# Patient Record
Sex: Male | Born: 1945 | Race: White | Hispanic: No | Marital: Single
Health system: Southern US, Community
[De-identification: ages and names within clinical notes are randomized; demographics above are authoritative.]

## PROBLEM LIST (undated history)

## (undated) DIAGNOSIS — K260 Acute duodenal ulcer with hemorrhage: Secondary | ICD-10-CM

## (undated) DIAGNOSIS — E8809 Other disorders of plasma-protein metabolism, not elsewhere classified: Secondary | ICD-10-CM

## (undated) DIAGNOSIS — I10 Essential (primary) hypertension: Secondary | ICD-10-CM

## (undated) DIAGNOSIS — I872 Venous insufficiency (chronic) (peripheral): Secondary | ICD-10-CM

## (undated) DIAGNOSIS — I4819 Other persistent atrial fibrillation: Secondary | ICD-10-CM

## (undated) DIAGNOSIS — I4891 Unspecified atrial fibrillation: Secondary | ICD-10-CM

## (undated) DIAGNOSIS — D696 Thrombocytopenia, unspecified: Secondary | ICD-10-CM

## (undated) DIAGNOSIS — I509 Heart failure, unspecified: Secondary | ICD-10-CM

## (undated) DIAGNOSIS — D62 Acute posthemorrhagic anemia: Secondary | ICD-10-CM

## (undated) DIAGNOSIS — L309 Dermatitis, unspecified: Secondary | ICD-10-CM

## (undated) DIAGNOSIS — I429 Cardiomyopathy, unspecified: Secondary | ICD-10-CM

## (undated) DIAGNOSIS — N289 Disorder of kidney and ureter, unspecified: Secondary | ICD-10-CM

## (undated) DIAGNOSIS — D689 Coagulation defect, unspecified: Secondary | ICD-10-CM

## (undated) DIAGNOSIS — I5022 Chronic systolic (congestive) heart failure: Secondary | ICD-10-CM

## (undated) DIAGNOSIS — I499 Cardiac arrhythmia, unspecified: Secondary | ICD-10-CM

## (undated) HISTORY — DX: Acute posthemorrhagic anemia: D62

## (undated) HISTORY — DX: Cardiomyopathy, unspecified: I42.9

## (undated) HISTORY — DX: Other persistent atrial fibrillation: I48.19

## (undated) HISTORY — DX: Thrombocytopenia, unspecified: D69.6

## (undated) HISTORY — DX: Venous insufficiency (chronic) (peripheral): I87.2

## (undated) HISTORY — DX: Disorder of kidney and ureter, unspecified: N28.9

## (undated) HISTORY — DX: Coagulation defect, unspecified: D68.9

## (undated) HISTORY — DX: Chronic systolic (congestive) heart failure: I50.22

## (undated) HISTORY — DX: Other disorders of plasma-protein metabolism, not elsewhere classified: E88.09

---
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
## 1991-09-28 HISTORY — PX: HERNIA REPAIR: SHX51

## 2014-06-02 ENCOUNTER — Inpatient Hospital Stay (HOSPITAL_COMMUNITY)
Admission: EM | Admit: 2014-06-02 | Discharge: 2014-06-14 | DRG: 308 | Disposition: A | Discharge status: Home / Self Care | Payer: BC Managed Care – PPO | Attending: Internal Medicine | Admitting: Internal Medicine

## 2014-06-02 ENCOUNTER — Emergency Department (HOSPITAL_COMMUNITY): Payer: BC Managed Care – PPO

## 2014-06-02 ENCOUNTER — Encounter (HOSPITAL_COMMUNITY): Payer: Self-pay | Admitting: Emergency Medicine

## 2014-06-02 DIAGNOSIS — Z8249 Family history of ischemic heart disease and other diseases of the circulatory system: Secondary | ICD-10-CM

## 2014-06-02 DIAGNOSIS — I5031 Acute diastolic (congestive) heart failure: Secondary | ICD-10-CM

## 2014-06-02 DIAGNOSIS — I10 Essential (primary) hypertension: Secondary | ICD-10-CM | POA: Diagnosis present

## 2014-06-02 DIAGNOSIS — I447 Left bundle-branch block, unspecified: Secondary | ICD-10-CM | POA: Diagnosis not present

## 2014-06-02 DIAGNOSIS — I4891 Unspecified atrial fibrillation: Secondary | ICD-10-CM | POA: Diagnosis not present

## 2014-06-02 DIAGNOSIS — K5900 Constipation, unspecified: Secondary | ICD-10-CM | POA: Diagnosis not present

## 2014-06-02 DIAGNOSIS — I059 Rheumatic mitral valve disease, unspecified: Secondary | ICD-10-CM | POA: Diagnosis present

## 2014-06-02 DIAGNOSIS — I340 Nonrheumatic mitral (valve) insufficiency: Secondary | ICD-10-CM

## 2014-06-02 DIAGNOSIS — I509 Heart failure, unspecified: Secondary | ICD-10-CM | POA: Diagnosis present

## 2014-06-02 DIAGNOSIS — K250 Acute gastric ulcer with hemorrhage: Secondary | ICD-10-CM

## 2014-06-02 DIAGNOSIS — E876 Hypokalemia: Secondary | ICD-10-CM | POA: Diagnosis not present

## 2014-06-02 DIAGNOSIS — L259 Unspecified contact dermatitis, unspecified cause: Secondary | ICD-10-CM | POA: Diagnosis present

## 2014-06-02 DIAGNOSIS — K920 Hematemesis: Secondary | ICD-10-CM | POA: Diagnosis present

## 2014-06-02 DIAGNOSIS — Z79899 Other long term (current) drug therapy: Secondary | ICD-10-CM

## 2014-06-02 DIAGNOSIS — K260 Acute duodenal ulcer with hemorrhage: Secondary | ICD-10-CM | POA: Diagnosis present

## 2014-06-02 DIAGNOSIS — I428 Other cardiomyopathies: Secondary | ICD-10-CM | POA: Diagnosis present

## 2014-06-02 DIAGNOSIS — I4819 Other persistent atrial fibrillation: Secondary | ICD-10-CM | POA: Diagnosis present

## 2014-06-02 DIAGNOSIS — N179 Acute kidney failure, unspecified: Secondary | ICD-10-CM | POA: Diagnosis present

## 2014-06-02 DIAGNOSIS — I110 Hypertensive heart disease with heart failure: Secondary | ICD-10-CM

## 2014-06-02 DIAGNOSIS — D689 Coagulation defect, unspecified: Secondary | ICD-10-CM | POA: Diagnosis present

## 2014-06-02 DIAGNOSIS — D62 Acute posthemorrhagic anemia: Secondary | ICD-10-CM | POA: Diagnosis present

## 2014-06-02 DIAGNOSIS — I482 Chronic atrial fibrillation, unspecified: Secondary | ICD-10-CM

## 2014-06-02 DIAGNOSIS — K922 Gastrointestinal hemorrhage, unspecified: Secondary | ICD-10-CM

## 2014-06-02 DIAGNOSIS — I498 Other specified cardiac arrhythmias: Secondary | ICD-10-CM | POA: Diagnosis not present

## 2014-06-02 DIAGNOSIS — I9589 Other hypotension: Secondary | ICD-10-CM | POA: Diagnosis not present

## 2014-06-02 DIAGNOSIS — K449 Diaphragmatic hernia without obstruction or gangrene: Secondary | ICD-10-CM | POA: Diagnosis present

## 2014-06-02 DIAGNOSIS — E119 Type 2 diabetes mellitus without complications: Secondary | ICD-10-CM | POA: Diagnosis present

## 2014-06-02 DIAGNOSIS — I5021 Acute systolic (congestive) heart failure: Secondary | ICD-10-CM | POA: Diagnosis present

## 2014-06-02 HISTORY — DX: Essential (primary) hypertension: I10

## 2014-06-02 HISTORY — DX: Acute duodenal ulcer with hemorrhage: K26.0

## 2014-06-02 HISTORY — DX: Dermatitis, unspecified: L30.9

## 2014-06-02 HISTORY — DX: Cardiac arrhythmia, unspecified: I49.9

## 2014-06-02 HISTORY — DX: Unspecified atrial fibrillation: I48.91

## 2014-06-02 LAB — CBC WITH DIFFERENTIAL/PLATELET
Basophils Absolute: 0 10*3/uL (ref 0.0–0.1)
Basophils Relative: 0 % (ref 0–1)
Eosinophils Absolute: 0.1 10*3/uL (ref 0.0–0.7)
Eosinophils Relative: 1 % (ref 0–5)
HCT: 47 % (ref 39.0–52.0)
HEMOGLOBIN: 16.1 g/dL (ref 13.0–17.0)
LYMPHS PCT: 8 % — AB (ref 12–46)
Lymphs Abs: 0.7 10*3/uL (ref 0.7–4.0)
MCH: 32.5 pg (ref 26.0–34.0)
MCHC: 34.3 g/dL (ref 30.0–36.0)
MCV: 94.9 fL (ref 78.0–100.0)
Monocytes Absolute: 0.6 10*3/uL (ref 0.1–1.0)
Monocytes Relative: 8 % (ref 3–12)
NEUTROS ABS: 6.5 10*3/uL (ref 1.7–7.7)
Neutrophils Relative %: 83 % — ABNORMAL HIGH (ref 43–77)
Platelets: 225 10*3/uL (ref 150–400)
RBC: 4.95 MIL/uL (ref 4.22–5.81)
RDW: 14.7 % (ref 11.5–15.5)
WBC: 7.9 10*3/uL (ref 4.0–10.5)

## 2014-06-02 LAB — I-STAT CHEM 8, ED
BUN: 30 mg/dL — AB (ref 6–23)
CREATININE: 1.4 mg/dL — AB (ref 0.50–1.35)
Calcium, Ion: 1.06 mmol/L — ABNORMAL LOW (ref 1.13–1.30)
Chloride: 107 mEq/L (ref 96–112)
Glucose, Bld: 113 mg/dL — ABNORMAL HIGH (ref 70–99)
HCT: 48 % (ref 39.0–52.0)
HEMOGLOBIN: 16.3 g/dL (ref 13.0–17.0)
POTASSIUM: 3.4 meq/L — AB (ref 3.7–5.3)
SODIUM: 142 meq/L (ref 137–147)
TCO2: 22 mmol/L (ref 0–100)

## 2014-06-02 LAB — PROTIME-INR
INR: 1.32 (ref 0.00–1.49)
Prothrombin Time: 16.4 seconds — ABNORMAL HIGH (ref 11.6–15.2)

## 2014-06-02 LAB — HEMOGLOBIN A1C
Hgb A1c MFr Bld: 6.2 % — ABNORMAL HIGH (ref <5.7)
Mean Plasma Glucose: 131 mg/dL — ABNORMAL HIGH (ref <117)

## 2014-06-02 LAB — COMPREHENSIVE METABOLIC PANEL
ALT: 113 U/L — ABNORMAL HIGH (ref 0–53)
ANION GAP: 16 — AB (ref 5–15)
AST: 41 U/L — ABNORMAL HIGH (ref 0–37)
Albumin: 3.1 g/dL — ABNORMAL LOW (ref 3.5–5.2)
Alkaline Phosphatase: 48 U/L (ref 39–117)
BUN: 29 mg/dL — AB (ref 6–23)
CALCIUM: 8.3 mg/dL — AB (ref 8.4–10.5)
CO2: 22 mEq/L (ref 19–32)
Chloride: 107 mEq/L (ref 96–112)
Creatinine, Ser: 1.33 mg/dL (ref 0.50–1.35)
GFR, EST AFRICAN AMERICAN: 63 mL/min — AB (ref >90)
GFR, EST NON AFRICAN AMERICAN: 54 mL/min — AB (ref >90)
GLUCOSE: 95 mg/dL (ref 70–99)
Potassium: 4.2 mEq/L (ref 3.7–5.3)
SODIUM: 145 meq/L (ref 137–147)
Total Bilirubin: 2.3 mg/dL — ABNORMAL HIGH (ref 0.3–1.2)
Total Protein: 5.5 g/dL — ABNORMAL LOW (ref 6.0–8.3)

## 2014-06-02 LAB — PRO B NATRIURETIC PEPTIDE: PRO B NATRI PEPTIDE: 4097 pg/mL — AB (ref 0–125)

## 2014-06-02 LAB — TSH: TSH: 1.9 u[IU]/mL (ref 0.350–4.500)

## 2014-06-02 LAB — TROPONIN I: Troponin I: <0.3 ng/mL (ref <0.30)

## 2014-06-02 LAB — MAGNESIUM: Magnesium: 2.2 mg/dL (ref 1.5–2.5)

## 2014-06-02 LAB — T4, FREE: FREE T4: 1.46 ng/dL (ref 0.80–1.80)

## 2014-06-02 MED ORDER — POTASSIUM CHLORIDE CRYS ER 20 MEQ PO TBCR: 40.0000 meq | EXTENDED_RELEASE_TABLET | Freq: Once | ORAL | Status: DC

## 2014-06-02 MED ORDER — POTASSIUM CHLORIDE CRYS ER 20 MEQ PO TBCR
40.0000 meq | EXTENDED_RELEASE_TABLET | Freq: Once | ORAL | Status: AC
  Administered 2014-06-02: 40 meq via ORAL
  Filled 2014-06-02: qty 2

## 2014-06-02 MED ORDER — DILTIAZEM HCL 60 MG PO TABS
60.0000 mg | ORAL_TABLET | Freq: Four times a day (QID) | ORAL | Status: DC
  Administered 2014-06-02: 60 mg via ORAL
  Filled 2014-06-02 (×3): qty 1

## 2014-06-02 MED ORDER — APIXABAN 5 MG PO TABS
5.0000 mg | ORAL_TABLET | Freq: Two times a day (BID) | ORAL | Status: DC
  Administered 2014-06-02 – 2014-06-05 (×7): 5 mg via ORAL
  Filled 2014-06-02 (×7): qty 1

## 2014-06-02 MED ORDER — SODIUM CHLORIDE 0.9 % IJ SOLN: 3.0000 mL | INTRAMUSCULAR | Status: DC | PRN

## 2014-06-02 MED ORDER — DILTIAZEM HCL 90 MG PO TABS: 90.0000 mg | ORAL_TABLET | Freq: Four times a day (QID) | ORAL | Status: DC

## 2014-06-02 MED ORDER — SODIUM CHLORIDE 0.9 % IJ SOLN
3.0000 mL | Freq: Two times a day (BID) | INTRAMUSCULAR | Status: DC
  Administered 2014-06-02 – 2014-06-06 (×7): 3 mL via INTRAVENOUS

## 2014-06-02 MED ORDER — ONDANSETRON HCL 4 MG/2ML IJ SOLN
4.0000 mg | Freq: Four times a day (QID) | INTRAMUSCULAR | Status: DC | PRN
Start: 2014-06-02 — End: 2014-06-14
  Administered 2014-06-07 – 2014-06-08 (×3): 4 mg via INTRAVENOUS
  Filled 2014-06-02 (×4): qty 2

## 2014-06-02 MED ORDER — ALPRAZOLAM 0.25 MG PO TABS: 0.2500 mg | ORAL_TABLET | Freq: Two times a day (BID) | ORAL | Status: DC | PRN

## 2014-06-02 MED ORDER — DILTIAZEM HCL 100 MG IV SOLR
5.0000 mg/h | INTRAVENOUS | Status: DC
  Filled 2014-06-02: qty 100

## 2014-06-02 MED ORDER — ELETONE EX CREA: 1.0000 "application " | TOPICAL_CREAM | Freq: Two times a day (BID) | CUTANEOUS | Status: DC | PRN

## 2014-06-02 MED ORDER — OFF THE BEAT BOOK
Freq: Once | Status: AC
  Administered 2014-06-02: 1
  Filled 2014-06-02: qty 1

## 2014-06-02 MED ORDER — DILTIAZEM HCL 90 MG PO TABS
90.0000 mg | ORAL_TABLET | Freq: Four times a day (QID) | ORAL | Status: DC
  Administered 2014-06-02 – 2014-06-03 (×2): 90 mg via ORAL
  Filled 2014-06-02 (×6): qty 1

## 2014-06-02 MED ORDER — DILTIAZEM HCL 25 MG/5ML IV SOLN
20.0000 mg | Freq: Once | INTRAVENOUS | Status: AC
  Administered 2014-06-02: 20 mg via INTRAVENOUS
  Filled 2014-06-02: qty 5

## 2014-06-02 MED ORDER — DEXTROSE 5 % IV SOLN
5.0000 mg/h | INTRAVENOUS | Status: DC
  Administered 2014-06-02: 5 mg/h via INTRAVENOUS
  Filled 2014-06-02: qty 100

## 2014-06-02 MED ORDER — ACETAMINOPHEN 325 MG PO TABS: 650.0000 mg | ORAL_TABLET | ORAL | Status: DC | PRN

## 2014-06-02 MED ORDER — SODIUM CHLORIDE 0.9 % IV SOLN: 250.0000 mL | INTRAVENOUS | Status: DC | PRN

## 2014-06-02 NOTE — ED Notes (Signed)
Received pt from home via EMS with c/o diagnosed with Afib on Monday. Pt was not feeling well 05/24/2014. Pt was prescribed metoprolol and takes lisinopril for HTN. Pt reports lethargy and decrease in appetite increased. Pt stopped taking the metoprolol 48 hours ago. Pt felt his heart racing and shortness of breath. Pt given 324 mg of ASA by EMS. EMS EKG showed afib and LBBB. Pt denies history of LBBB.

## 2014-06-02 NOTE — ED Provider Notes (Signed)
CSN: [id number]     Arrival date & time 06/02/14  1478 History   First MD Initiated Contact with Patient 06/02/14 1009     Chief Complaint  Patient presents with  . Atrial Fibrillation  . Fatigue  . Shortness of Breath     (Consider location/radiation/quality/duration/timing/severity/associated sxs/prior Treatment) HPI Complains of fatigue, weakness in his legs, and shortness of breath onset 7 days ago. He was seen by Dr. Su Hilt 6 days ago determined to be in atrial fibrillation, started on metoprolol 6 days ago which she took until 05/31/2014. He stopped the medication as it was causing increasing fatigue. He presents today complaining of shortness of breath and fatigue brought by EMS EMS determined patient to be in atrial fibrillation with rapid ventricular response. Treated patient with aspirin. Nothing makes symptoms better or worse. Past Medical History  Diagnosis Date  . Hypertension   . A-fib   . Eczema    patient learned of atrial fibrillation one week ago History reviewed. No pertinent past surgical history. No family history on file. History  Substance Use Topics  . Smoking status: Never Smoker   . Smokeless tobacco: Not on file  . Alcohol Use: No    Review of Systems  Constitutional: Positive for fatigue.  HENT: Negative.   Respiratory: Positive for shortness of breath.   Cardiovascular: Negative.   Gastrointestinal: Negative.   Musculoskeletal: Negative.   Skin: Negative.   Neurological: Negative.   Psychiatric/Behavioral: Negative.   All other systems reviewed and are negative.     Allergies  Review of patient's allergies indicates no known allergies.  Home Medications   Prior to Admission medications   Not on File   BP 127/81  Pulse 127  Temp(Src) 98.5 F (36.9 C) (Oral)  Resp 22  Ht  (1.905 m)  Wt 265 lb (120.203 kg)  BMI 33.12 kg/m2  SpO2 96% Physical Exam  Nursing note and vitals reviewed. Constitutional: He appears well-developed  and well-nourished.  HENT:  Head: Normocephalic and atraumatic.  Eyes: Conjunctivae are normal. Pupils are equal, round, and reactive to light.  Neck: Neck supple. No tracheal deviation present. No thyromegaly present.  Cardiovascular:  No murmur heard. Irregularly irregular tachycardic  Pulmonary/Chest: Effort normal and breath sounds normal.  Abdominal: Soft. Bowel sounds are normal. He exhibits no distension. There is no tenderness.  Musculoskeletal: Normal range of motion. He exhibits no edema and no tenderness.  Neurological: He is alert. Coordination normal.  Skin: Skin is warm and dry. No rash noted.  Psychiatric: He has a normal mood and affect.    ED Course  Procedures (including critical care time) Labs Review Labs Reviewed  TROPONIN I  PRO B NATRIURETIC PEPTIDE  CBC WITH DIFFERENTIAL  I-STAT CHEM 8, ED   vagal maneuvers attempted, without change in heart rate Imaging Review No results found.   EKG Interpretation   Date/Time:  :25 a.m. patient states "I feel much improved." After treatment with intravenous [id number]  Date: 06/02/2014  Rate: 105  Rhythm: atrial fibrillation  QRS Axis: normal  Intervals: normal  ST/T Wave abnormalities: nonspecific ST/T changes  Conduction Disutrbances:left bundle  branch block  Narrative Interpretation:   Old EKG Reviewed: Rate slower than previous tracing Results for orders placed during the hospital encounter of 06/02/14  TROPONIN I      Result Value Ref Range   Troponin I <0.30  <0.30 ng/mL  PRO B NATRIURETIC PEPTIDE      Result Value Ref Range   Pro B Natriuretic peptide (BNP)  4097.0 (*) 0 - 125 pg/mL  CBC WITH DIFFERENTIAL      Result Value Ref Range   WBC 7.9  4.0 - 10.5 K/uL   RBC 4.95  4.22 - 5.81 MIL/uL   Hemoglobin 16.1  13.0 - 17.0 g/dL   HCT 59.9  35.7 - 01.7 %   MCV 94.9  78.0 - 100.0 fL   MCH 32.5  26.0 - 34.0 pg   MCHC 34.3  30.0 - 36.0 g/dL   RDW 79.3  90.3 - 00.9 %   Platelets 225  150 - 400 K/uL   Neutrophils Relative % 83 (*) 43 - 77 %   Neutro Abs 6.5  1.7 - 7.7 K/uL   Lymphocytes Relative 8 (*) 12 - 46 %   Lymphs Abs 0.7  0.7 - 4.0 K/uL   Monocytes Relative 8  3 - 12 %   Monocytes Absolute 0.6  0.1 - 1.0 K/uL   Eosinophils Relative 1  0 - 5 %   Eosinophils Absolute 0.1  0.0 - 0.7 K/uL   Basophils Relative 0  0 - 1 %   Basophils Absolute 0.0  0.0 - 0.1 K/uL  I-STAT CHEM 8, ED      Result Value Ref Range   Sodium 142  137 - 147 mEq/L   Potassium 3.4 (*) 3.7 - 5.3 mEq/L   Chloride 107  96 - 112 mEq/L   BUN 30 (*) 6 - 23 mg/dL   Creatinine, Ser 2.33 (*) 0.50 - 1.35 mg/dL   Glucose, Bld 007 (*) 70 - 99 mg/dL   Calcium, Ion 6.22 (*) 1.13 - 1.30 mmol/L   TCO2 22  0 - 100 mmol/L   Hemoglobin 16.3  13.0 - 17.0 g/dL   HCT 63.3  35.4 - 56.2 %   Dg Chest Portable 1 View  06/02/2014   CLINICAL DATA:  Shortness of breath, fatigue, history of atrial fibrillation.  EXAM: PORTABLE CHEST - 1 VIEW  COMPARISON:  None.  FINDINGS: Markedly enlarged cardiac silhouette and mediastinal contours. Pulmonary venous congestion without frank evidence of edema. Bibasilar heterogeneous opacities, left greater than right. No definite pleural effusion, though note, the bilateral costophrenic angles are excluded from view. No pneumothorax. Unchanged bones.  IMPRESSION: Marked cardiomegaly and pulmonary venous congestion without frank evidence of edema. Further evaluation with a PA and lateral chest radiograph may be obtained as clinically indicated.   Electronically Signed   By: Simonne Come M.D.   On: 06/02/2014 10:54   Chest xray viewed by me MDM  Dr. Johney Frame was  consulted and made arrangements for inpatient stay Final diagnoses:  None   In light of elevated bnp , mild chf likely Dx#1 atrial fibrillation  With rapid ventricular response #2 hypokalemia #3 CHF      [name], MD 06/02/14 1353

## 2014-06-02 NOTE — Progress Notes (Signed)
Called ED to attempt to get report on pt.

## 2014-06-02 NOTE — Progress Notes (Signed)
Telemetry showing sustained tachycardia/A-fib (140s-150s).  Pt is asymptomatic and he is only lying in the bed at this time.  BP=154/108.  Notified Dr Alphonsus Sias and new orders received.  Will cont to monitor.

## 2014-06-02 NOTE — Progress Notes (Signed)
Cardizem gtt started @ 2053 at 5mg /hr.  Patient's rhythm: A-fib with BBB with a rate of 109-115 bpm.  Will continue to monitor.

## 2014-06-02 NOTE — Progress Notes (Signed)
Pt has been asymptomatic with afib. He is hemodynamically quite stable.   I would like to avoid IV cardizem in order to avoid a prolongation of his hospital stay. Will increase oral diltiazem to 90mg  q6 hours and try to wean off IV cardizem as long as HRs are less than 100 or 110 bpm.  Hillis Range MD

## 2014-06-02 NOTE — H&P (Signed)
History and Physical  Patient ID: Bryan Moran MRN: [id number], DOB: Feb 14, 1946 Date of Encounter: 06/02/2014, 12:40 PM Primary Physician: Lorenda Peck, MD Primary Cardiologist: New to Dr. Johney Frame  Chief Complaint: SOB, "just feel bad" Reason for Admission: recently diagnosed persistent atrial fibrillation  HPI: Bryan Moran is a 67 y/o active M runner with history of HTN and eczema who was recently diagnosed with atrial fibrillation. He runs 4 miles twice a week typically without any difficulty. About 4 weeks ago while running he started perspiring profusely which was unusual. He rested and this resolved. He was able to go on with running as usual for several weeks after. However, during the last full week of August he began feeling "bad" with malaise, decreased appetite, fatigue, SOB and intermittent dizziness. He saw his PCP on Monday 8/31 who diagnosed him with atrial fib and prescribed metoprolol. However, he felt "horrible" while taking this so discontinued it on 9/4. He has had some orthopnea.Today he felt shaky, short of breath and fatigued so was brought into the ED by EMS with AF-RVR, HR 130s-140s. He is also noted to have a LBBB of unclear chronicity. He says he had an EKG back in June for his annual physical and was told everything looked all right. It is Saturday thus that EKG is unavailable. He has not had any chest pain to speak of, even while running. No prior h/o DM, TIA/CVA, bleeding issues, falls, LEE, weight changes, syncope, recent travel/bedrest/surgery or family/personal hx of blood clots. In the ED, workup significant for hypokalemia 3.4, BUN/Cr 30/1.4, glu 113, troponin neg x 1, Hgb 16.1, pBNP 4k. CXR with marked cardiomegaly and pulmonary venous congestion without frank evidence of edema. He was given 20mg  IV diltiazem with HR improvement to 110's. He has been unaware of palpitations.  Past Medical History  Diagnosis Date  . Hypertension   . A-fib     a. Officially dx  05/27/14, sx may have been going on longer.  . Eczema      Most Recent Cardiac Studies: None   Surgical History: History reviewed. No pertinent past surgical history.   Home Meds: Prior to Admission medications   Medication Sig Start Date End Date Taking? Authorizing Provider  Dermatological Products, Misc. (ELETONE EX) Apply 1 application topically 2 (two) times daily as needed (eczema).   Yes Historical Provider, MD  lisinopril-hydrochlorothiazide (PRINZIDE,ZESTORETIC) 20-12.5 MG per tablet Take 1 tablet by mouth daily.   Yes Historical Provider, MD  Naproxen Sodium (ALEVE PO) Take 2 tablets by mouth daily as needed (pain).   Yes Historical Provider, MD    Allergies: No Known Allergies  History   Social History  . Marital Status: Single    Spouse Name: N/A    Number of Children: N/A  . Years of Education: N/A   Occupational History  . Not on file.   Social History Main Topics  . Smoking status: Never Smoker   . Smokeless tobacco: Not on file  . Alcohol Use: No  . Drug Use: No  . Sexual Activity: Not on file   Other Topics Concern  . Not on file   Social History Narrative  . No narrative on file     Family History  Problem Relation Age of Onset  . Depression Father   . Heart disease Father     Some sort of heart issue at end of life - possibly CAD  . Multiple sclerosis Mother   . Cancer Mother  Tumor in kidney, spread to bones    Review of Systems: General: negative for chills, fever, night sweats  Cardiovascular: see above Dermatological: negative for rash Respiratory: negative for cough or wheezing Urologic: negative for hematuria Abdominal: negative for nausea, vomiting, diarrhea, bright [place] blood per rectum, melena, or hematemesis Neurologic: negative for visual changes, syncope, or dizziness All other systems reviewed and are otherwise negative except as noted above.  Labs:   Lab Results  Component Value Date   WBC 7.9 06/02/2014   HGB 16.3  06/02/2014   HCT 48.0 06/02/2014   MCV 94.9 06/02/2014   PLT 225 06/02/2014    Recent Labs Lab 06/02/14 1050  NA 142  K 3.4*  CL 107  BUN 30*  CREATININE 1.40*  GLUCOSE 113*    Recent Labs  06/02/14 1038  TROPONINI <0.30    Radiology/Studies:  Dg Chest Portable 1 View 06/02/2014   CLINICAL DATA:  Shortness of breath, fatigue, history of atrial fibrillation.  EXAM: PORTABLE CHEST - 1 VIEW  COMPARISON:  None.  FINDINGS: Markedly enlarged cardiac silhouette and mediastinal contours. Pulmonary venous congestion without frank evidence of edema. Bibasilar heterogeneous opacities, left greater than right. No definite pleural effusion, though note, the bilateral costophrenic angles are excluded from view. No pneumothorax. Unchanged bones.  IMPRESSION: Marked cardiomegaly and pulmonary venous congestion without frank evidence of edema. Further evaluation with a PA and lateral chest radiograph may be obtained as clinically indicated.   Electronically Signed   By: Simonne Come M.D.   On: 06/02/2014 10:54    EKG: atrial fib with LBBB, nonspecific ST-T changes  Physical Exam: Blood pressure 118/88, pulse 99, temperature 98.7 F (37.1 C), temperature source Oral, resp. rate 17, height  (1.905 m), weight 265 lb (120.203 kg), SpO2 95.00%. General: Well developed, well nourished, in no acute distress. Head: Normocephalic, atraumatic, sclera non-icteric, no xanthomas, nares are without discharge.  Neck: Negative for carotid bruits. JVD not elevated. Lungs: Slightly coarse at bases but generally clear bilaterally to auscultation without wheezes, rales, or rhonchi. Breathing is unlabored. Heart: Irr irreg, tachycardic, with S1 S2. No murmurs, rubs, or gallops appreciated. Abdomen: Soft, non-tender, rounded with normoactive bowel sounds. No hepatomegaly. No rebound/guarding. No obvious abdominal masses. Msk:  Strength and tone appear normal for age. Extremities: No clubbing or cyanosis. No edema.  Distal  pedal pulses are 2+ and equal bilaterally. Neuro: Alert and oriented X 3. No focal deficit. No facial asymmetry. Moves all extremities spontaneously. Psych:  Responds to questions appropriately with a normal affect.    ASSESSMENT AND PLAN:  1. Recently diagnosed persistent atrial fibrillation with RVR - will start oral diltiazem. He did not tolerate metoprolol well - not sure if maybe BP was low when this was added to his usual lisinopril/HCTZ, or if it just really didn't help with rate. Check TSH and echo. CHADSVASC = at least 2 for HTN and age >70; pending echo/A1C thus he likely warrants anticoagulation. Start NOAC. He already feels somewhat better with HR improved to the 110 range - if symptoms continue to improve, we could consider anticoagulation for 3-4 weeks then outpatient DCCV. However, if he remains feeling poorly or LV down would consider TEE/DCCV. 2. Possible acute CHF r/t #1 - marked cardiomegaly on CXR with vascular congestion. pBNP is up. He's had orthopnea. He does not really have signs of a pericardial effusion but can assess on echo. Will d/w MD and follow symptoms after rate control. 3. Acute kidney injury / unknown  baseline renal function - hold lisinopril/HCTZ in lieu of rate controlling agents and follow. 4. HTN - follow. 5. LBBB - unknown chronicity. No chest pain. May be helpful to compare to June EKG from PCP when office re-opens. 6. Hypokalemia - replete with KCl in ER and follow. May be due to HCTZ. 7. Hyperglycemia - check A1C. 8. Hypocalcemia - check full CMET as opposed to Istat.   Signed, Dayna Dunn PA-C 06/02/2014, 12:40 PM    I have seen, examined the patient, and reviewed the above assessment and plan.  Changes to above are made where necessary.  Will admit to adjust rate control.  Echo to evaluate for structural heart disease.  Will likely need to pursue sinus rhythm once adequately anticoagulated.  Will start eliquis today.  If symptoms are improved with  rate control then perhaps we can discharge to return in 3 weeks for cardioversion.  If he is not better with rate control then we may need to consider TEE cardioversion while here.  Co Sign: Hillis Range, MD 06/02/2014 2:30 PM

## 2014-06-03 DIAGNOSIS — I059 Rheumatic mitral valve disease, unspecified: Secondary | ICD-10-CM

## 2014-06-03 LAB — BASIC METABOLIC PANEL
Anion gap: 13 (ref 5–15)
BUN: 28 mg/dL — AB (ref 6–23)
CALCIUM: 8 mg/dL — AB (ref 8.4–10.5)
CO2: 23 meq/L (ref 19–32)
Chloride: 106 mEq/L (ref 96–112)
Creatinine, Ser: 1.26 mg/dL (ref 0.50–1.35)
GFR calc Af Amer: 67 mL/min — ABNORMAL LOW (ref >90)
GFR, EST NON AFRICAN AMERICAN: 58 mL/min — AB (ref >90)
GLUCOSE: 116 mg/dL — AB (ref 70–99)
Potassium: 3.6 mEq/L — ABNORMAL LOW (ref 3.7–5.3)
Sodium: 142 mEq/L (ref 137–147)

## 2014-06-03 LAB — LIPID PANEL
Cholesterol: 119 mg/dL (ref 0–200)
HDL: 24 mg/dL — ABNORMAL LOW (ref >39)
LDL CALC: 80 mg/dL (ref 0–99)
TRIGLYCERIDES: 73 mg/dL (ref <150)
Total CHOL/HDL Ratio: 5 RATIO
VLDL: 15 mg/dL (ref 0–40)

## 2014-06-03 LAB — CBC
HCT: 47.3 % (ref 39.0–52.0)
HEMOGLOBIN: 15.6 g/dL (ref 13.0–17.0)
MCH: 31.8 pg (ref 26.0–34.0)
MCHC: 33 g/dL (ref 30.0–36.0)
MCV: 96.3 fL (ref 78.0–100.0)
Platelets: 216 10*3/uL (ref 150–400)
RBC: 4.91 MIL/uL (ref 4.22–5.81)
RDW: 14.7 % (ref 11.5–15.5)
WBC: 9 10*3/uL (ref 4.0–10.5)

## 2014-06-03 MED ORDER — DILTIAZEM HCL ER COATED BEADS 240 MG PO CP24
240.0000 mg | ORAL_CAPSULE | Freq: Two times a day (BID) | ORAL | Status: DC
  Administered 2014-06-03 – 2014-06-04 (×3): 240 mg via ORAL
  Filled 2014-06-03 (×4): qty 1

## 2014-06-03 MED ORDER — FUROSEMIDE 10 MG/ML IJ SOLN
40.0000 mg | Freq: Once | INTRAMUSCULAR | Status: AC
  Administered 2014-06-03: 40 mg via INTRAVENOUS
  Filled 2014-06-03: qty 4

## 2014-06-03 NOTE — Care Management Note (Unsigned)
    Page 1 of 2   06/07/2014     3:07:29 PM CARE MANAGEMENT NOTE 06/07/2014  Patient:  Camero,Khadir   Account Number:  [id number]  Date Initiated:  06/02/2014  Documentation initiated by:  [facility]  Subjective/Objective Assessment:   Admitted with Afib.     Action/Plan:   Anticipated DC Date:  06/05/2014   Anticipated DC Plan:  HOME/SELF CARE      DC Planning Services  CM consult  Medication Assistance      Choice offered to / List presented to:             Status of service:  In process, will continue to follow Medicare Important Message given?   (If response is "NO", the following Medicare IM given date fields will be blank) Date Medicare IM given:   Medicare IM given by:   Date Additional Medicare IM given:   Additional Medicare IM given by:    Discharge Disposition:    Per UR Regulation:  Reviewed for med. necessity/level of care/duration of stay  If discussed at Long Length of Stay Meetings, dates discussed:    Comments:   06-07-14 1504 Tomi Bamberger, RN, BSN [phone] Eliquis co pay per bcbs [place] online: tier 2, on drug list, covered, $45 at retail. Pt NPO for now. Once pt starts back on Tikosyn he will need a Rx for 7 day supply no refills and then the original Rx with refills. Please call Case Manager to assist with 7 day supply of Tikosyn.   06/06/14 Sidney Ace, RN, BSN [phone] Pt is 68yo, and does not have Medicare...discussed with pt, and he is open to applying for Medicare, "just hasn't gotten around to it."  Pt's congregational nurse, Marisue Humble, visiting--she plans to visit pt post dc, and states she will follow up with pt on completing Medicare application. Pt agreeable to this plan.  06/06/14 Sidney Ace, RN, BSN [phone] Pt with failed cardioversion on 9/9; now on Tikosyn. Benefits as follows:  per bcbs [place] online: tikosyn is covered, on drug list, tier 3, $65.00 at retail  [facility], [facility] [place], is able to dispense med,  but will have to order med upon dc.  Pt will need extra Rx for Tikosyn upon dc for one week's supply of med to be filled by main pharmacy prior to dc.  Will follow up.   06/05/14 Sidney Ace, RN, BSN [phone]  per bcbs [place] online: tier 2, on drug list, covered, $45 at retail  06/04/14 Sidney Ace, RN, BSN [phone] TEE scheduled for 06/05/14.  Will check Eliquis insurance coverage.  06-03-14 10:20am Avie Arenas, RNBSN [phone] Eliquis cards given to patient for free 30 day and co pay. [facility]ses [facility] - suggested getting these filled at [facility] with using the cards.  Plan for dc tomorrow.

## 2014-06-03 NOTE — Progress Notes (Signed)
Dr. Johney Frame advised that patient should be given Cardizem 90 mg PO at 2300 and that Cardizem gtt should be weaned off tonight when patient's HR is in the low 100's.  Cardizem gtt discontinued at this time.

## 2014-06-03 NOTE — Progress Notes (Signed)
SUBJECTIVE: The patient is doing well today.  At this time, he denies chest pain, shortness of breath, or any new concerns.  V rates are still elevated but improved.  Marland Kitchen apixaban  5 mg Oral BID  . diltiazem  240 mg Oral BID  . sodium chloride  3 mL Intravenous Q12H      OBJECTIVE: Physical Exam: Filed Vitals:   06/03/14 0044 06/03/14 0108 06/03/14 0234 06/03/14 0429  BP: 111/83 110/90 129/98 120/85  Pulse: 109 94 102 100  Temp:    97.6 F (36.4 C)  TempSrc:    Oral  Resp:    16  Height:      Weight:    260 lb 9.3 oz (118.2 kg)  SpO2:    98%    Intake/Output Summary (Last 24 hours) at 06/03/14 0930 Last data filed at 06/02/14 2120  Gross per 24 hour  Intake      3 ml  Output      0 ml  Net      3 ml    Telemetry reveals afib, V rates 100-120 bpm  GEN- The patient is well appearing, alert and oriented x 3 today.   Head- normocephalic, atraumatic Eyes-  Sclera clear, conjunctiva pink Ears- hearing intact Oropharynx- clear Neck- supple, elevated JVP Lungs- Clear to ausculation bilaterally, normal work of breathing Heart- irregular rate and rhythm, no murmurs, rubs or gallops, PMI not laterally displaced GI- soft, NT, ND, + BS Extremities- no clubbing, cyanosis, + dependant edema Skin- no rash or lesion Psych- euthymic mood, full affect Neuro- strength and sensation are intact  LABS: Basic Metabolic Panel:  Recent Labs  91/63/84 1050 06/02/14 1710 06/03/14 0520  NA 142 145 142  K 3.4* 4.2 3.6*  CL 107 107 106  CO2  --  22 23  GLUCOSE 113* 95 116*  BUN 30* 29* 28*  CREATININE 1.40* 1.33 1.26  CALCIUM  --  8.3* 8.0*  MG  --  2.2  --    Liver Function Tests:  Recent Labs  06/02/14 1710  AST 41*  ALT 113*  ALKPHOS 48  BILITOT 2.3*  PROT 5.5*  ALBUMIN 3.1*   No results found for this basename: LIPASE, AMYLASE,  in the last 72 hours CBC:  Recent Labs  06/02/14 1038 06/02/14 1050 06/03/14 0520  WBC 7.9  --  9.0  NEUTROABS 6.5  --   --     HGB 16.1 16.3 15.6  HCT 47.0 48.0 47.3  MCV 94.9  --  96.3  PLT 225  --  216   Cardiac Enzymes:  Recent Labs  06/02/14 1038  TROPONINI <0.30   Hemoglobin A1C:  Recent Labs  06/02/14 1710  HGBA1C 6.2*   Fasting Lipid Panel:  Recent Labs  06/03/14 0520  CHOL 119  HDL 24*  LDLCALC 80  TRIG 73  CHOLHDL 5.0   Thyroid Function Tests:  Recent Labs  06/02/14 1710  TSH 1.900   Anemia Panel: No results found for this basename: VITAMINB12, FOLATE, FERRITIN, TIBC, IRON, RETICCTPCT,  in the last 72 hours  RADIOLOGY: Dg Chest Portable 1 View  06/02/2014   CLINICAL DATA:  Shortness of breath, fatigue, history of atrial fibrillation.  EXAM: PORTABLE CHEST - 1 VIEW  COMPARISON:  None.  FINDINGS: Markedly enlarged cardiac silhouette and mediastinal contours. Pulmonary venous congestion without frank evidence of edema. Bibasilar heterogeneous opacities, left greater than right. No definite pleural effusion, though note, the bilateral costophrenic angles are excluded from  view. No pneumothorax. Unchanged bones.  IMPRESSION: Marked cardiomegaly and pulmonary venous congestion without frank evidence of edema. Further evaluation with a PA and lateral chest radiograph may be obtained as clinically indicated.   Electronically Signed   By: Simonne Come M.D.   On: 06/02/2014 10:54    ASSESSMENT AND PLAN:  Active Problems:   Persistent atrial fibrillation  1. afib Change diltiazem to  BID for rate control Continue eliquis Will plan cardioversion at some point.  If cannot control V rates, may need TEE guided cardioversion prior to discharge, otherwise will possibly discharge tomorrow (if V rates < 100 bpm) with return in 3 weeks for cardioversion  2. LBBB Follow closely with rate control Echo is pending  3. HTN Stable No change required today  4. Acute CHF Gentle diuresis Awaiting echo    Hillis Range, MD 06/03/2014 9:30 AM

## 2014-06-03 NOTE — Progress Notes (Signed)
*  PRELIMINARY RESULTS* Echocardiogram 2D Echocardiogram has been performed.  Jeryl Columbia 06/03/2014, 2:36 PM

## 2014-06-04 DIAGNOSIS — I5021 Acute systolic (congestive) heart failure: Secondary | ICD-10-CM

## 2014-06-04 LAB — BASIC METABOLIC PANEL
ANION GAP: 11 (ref 5–15)
BUN: 25 mg/dL — ABNORMAL HIGH (ref 6–23)
CHLORIDE: 101 meq/L (ref 96–112)
CO2: 27 mEq/L (ref 19–32)
Calcium: 8.4 mg/dL (ref 8.4–10.5)
Creatinine, Ser: 1.43 mg/dL — ABNORMAL HIGH (ref 0.50–1.35)
GFR calc non Af Amer: 50 mL/min — ABNORMAL LOW (ref >90)
GFR, EST AFRICAN AMERICAN: 57 mL/min — AB (ref >90)
Glucose, Bld: 129 mg/dL — ABNORMAL HIGH (ref 70–99)
Potassium: 3.9 mEq/L (ref 3.7–5.3)
Sodium: 139 mEq/L (ref 137–147)

## 2014-06-04 MED ORDER — POTASSIUM CHLORIDE CRYS ER 20 MEQ PO TBCR
40.0000 meq | EXTENDED_RELEASE_TABLET | Freq: Every day | ORAL | Status: DC
  Administered 2014-06-04 – 2014-06-05 (×2): 40 meq via ORAL
  Filled 2014-06-04 (×3): qty 2

## 2014-06-04 MED ORDER — LISINOPRIL 5 MG PO TABS
5.0000 mg | ORAL_TABLET | Freq: Every day | ORAL | Status: DC
  Administered 2014-06-04 – 2014-06-06 (×3): 5 mg via ORAL
  Filled 2014-06-04 (×4): qty 1

## 2014-06-04 MED ORDER — SODIUM CHLORIDE 0.9 % IV SOLN
INTRAVENOUS | Status: DC
  Administered 2014-06-05: 500 mL via INTRAVENOUS

## 2014-06-04 MED ORDER — FUROSEMIDE 10 MG/ML IJ SOLN
40.0000 mg | Freq: Two times a day (BID) | INTRAMUSCULAR | Status: DC
  Administered 2014-06-04 – 2014-06-05 (×3): 40 mg via INTRAVENOUS
  Filled 2014-06-04 (×5): qty 4

## 2014-06-04 MED ORDER — CARVEDILOL 12.5 MG PO TABS
12.5000 mg | ORAL_TABLET | Freq: Two times a day (BID) | ORAL | Status: DC
  Administered 2014-06-04 – 2014-06-05 (×3): 12.5 mg via ORAL
  Filled 2014-06-04 (×6): qty 1

## 2014-06-04 NOTE — Progress Notes (Signed)
Utilization review completed.  

## 2014-06-04 NOTE — Progress Notes (Signed)
Notified Hospital doctor, RN of consent information; Dr. Mayford Knife will be performing a transesophageal echocardiography with direct current cardioversion for atrial fibrillation.   Hermina Barters, RN

## 2014-06-04 NOTE — Progress Notes (Signed)
SUBJECTIVE: The patient is doing well today.  At this time, he denies chest pain, shortness of breath, or any new concerns.  V rates are still elevated but improved.  He had more SOB overnight.  Echocardiogram yesterday demonstrated EF 15-20%, moderate concentric hypertrophy, diffuse hypokinesis, grade 2 diastolic dysfunction, moderate MR, biatrial enlargement (LA 53).   He denies prior anginal symptoms, no previous syncope.  He reports that he snores, no prior sleep study.   I have requested last 3 EKG's from PCP's office to be faxed for review.   CURRENT MEDICATIONS: . apixaban  5 mg Oral BID  . diltiazem  240 mg Oral BID  . sodium chloride  3 mL Intravenous Q12H   . sodium chloride      OBJECTIVE: Physical Exam: Filed Vitals:   06/03/14 1027 06/03/14 1449 06/03/14 1956 06/04/14 0424  BP: 136/79 108/84 140/88 127/85  Pulse: 69 90 70 118  Temp:  97.6 F (36.4 C) 97.9 F (36.6 C) 97.7 F (36.5 C)  TempSrc:  Oral Oral Oral  Resp:  Height:      Weight:    258 lb 2.5 oz (117.1 kg)  SpO2:  95% 92% 90%    Intake/Output Summary (Last 24 hours) at 06/04/14 0853 Last data filed at 06/03/14 2148  Gross per 24 hour  Intake    483 ml  Output      0 ml  Net    483 ml    Telemetry reveals afib, V rates 100-120 bpm  GEN- The patient is well appearing, alert and oriented x 3 today.   Head- normocephalic, atraumatic Eyes-  Sclera clear, conjunctiva pink Ears- hearing intact Oropharynx- clear Neck- supple, elevated JVP Lungs- Clear to ausculation bilaterally, normal work of breathing Heart- irregular rate and rhythm, no murmurs, rubs or gallops, PMI not laterally displaced GI- soft, NT, ND, + BS Extremities- no clubbing, cyanosis, + dependant edema Skin- no rash or lesion Psych- euthymic mood, full affect Neuro- strength and sensation are intact  LABS: Basic Metabolic Panel:  Recent Labs  40/98/11 1050 06/02/14 1710 06/03/14 0520  NA 142 145 142  K 3.4*  4.2 3.6*  CL 107 107 106  CO2  --  22 23  GLUCOSE 113* 95 116*  BUN 30* 29* 28*  CREATININE 1.40* 1.33 1.26  CALCIUM  --  8.3* 8.0*  MG  --  2.2  --    Liver Function Tests:  Recent Labs  06/02/14 1710  AST 41*  ALT 113*  ALKPHOS 48  BILITOT 2.3*  PROT 5.5*  ALBUMIN 3.1*   CBC:  Recent Labs  06/02/14 1038 06/02/14 1050 06/03/14 0520  WBC 7.9  --  9.0  NEUTROABS 6.5  --   --   HGB 16.1 16.3 15.6  HCT 47.0 48.0 47.3  MCV 94.9  --  96.3  PLT 225  --  216   Thyroid Function Tests:  Recent Labs  06/02/14 1710  TSH 1.900   RADIOLOGY: Dg Chest Portable 1 View 06/02/2014   CLINICAL DATA:  Shortness of breath, fatigue, history of atrial fibrillation.  EXAM: PORTABLE CHEST - 1 VIEW  COMPARISON:  None.  FINDINGS: Markedly enlarged cardiac silhouette and mediastinal contours. Pulmonary venous congestion without frank evidence of edema. Bibasilar heterogeneous opacities, left greater than right. No definite pleural effusion, though note, the bilateral costophrenic angles are excluded from view. No pneumothorax. Unchanged bones.  IMPRESSION: Marked cardiomegaly and pulmonary venous congestion without frank evidence  of edema. Further evaluation with a PA and lateral chest radiograph may be obtained as clinically indicated.   Electronically Signed   By: Simonne Come M.D.   On: 06/02/2014 10:54    ASSESSMENT AND PLAN:  Active Problems:   Persistent atrial fibrillation  1. afib Change diltiazem to coreg given reduced EF Continue eliquis Will plan TEE guided cardioversion tomorrow followed by initiation of tikosyn. Therapeutic strategies for afib including tikosyn and amiodarone were discussed in detail with the patient today. Risk, benefits, and alternatives to each medicine were discussed in detail today.  He would prefer to try tikosyn first.  I have reviewed his EKG.  In the setting of LBBB, his QT is mildly prolonged.  I think that we should proceed with tikosyn and follow QT  closely in sinus.   2. LBBB Follow closely with rate control  3. Acute systolic dysfunction Likely due to afib with RVR Will achieve sinus and then titrate medicines for chf Switch diltiazem to coreg Add lisinopril today No real ischemic symptoms--> consider outpatient myoview once in sinus Repeat echo in 3 months Diurese today  3. HTN Stable Add lisinopril as above  4. Moderate MR Will further evaluate by TEE

## 2014-06-05 ENCOUNTER — Encounter (HOSPITAL_COMMUNITY)
Admission: EM | Disposition: A | Discharge status: Home / Self Care | Payer: Self-pay | Source: Home / Self Care | Attending: Internal Medicine

## 2014-06-05 ENCOUNTER — Inpatient Hospital Stay (HOSPITAL_COMMUNITY): Payer: BC Managed Care – PPO | Admitting: Critical Care Medicine

## 2014-06-05 ENCOUNTER — Encounter (HOSPITAL_COMMUNITY): Payer: Self-pay

## 2014-06-05 ENCOUNTER — Encounter (HOSPITAL_COMMUNITY): Payer: BC Managed Care – PPO | Admitting: Critical Care Medicine

## 2014-06-05 DIAGNOSIS — I428 Other cardiomyopathies: Secondary | ICD-10-CM | POA: Diagnosis present

## 2014-06-05 DIAGNOSIS — E876 Hypokalemia: Secondary | ICD-10-CM | POA: Diagnosis not present

## 2014-06-05 DIAGNOSIS — K260 Acute duodenal ulcer with hemorrhage: Secondary | ICD-10-CM | POA: Diagnosis not present

## 2014-06-05 DIAGNOSIS — I509 Heart failure, unspecified: Secondary | ICD-10-CM | POA: Diagnosis present

## 2014-06-05 DIAGNOSIS — I4891 Unspecified atrial fibrillation: Secondary | ICD-10-CM | POA: Diagnosis present

## 2014-06-05 DIAGNOSIS — Z79899 Other long term (current) drug therapy: Secondary | ICD-10-CM | POA: Diagnosis not present

## 2014-06-05 DIAGNOSIS — E119 Type 2 diabetes mellitus without complications: Secondary | ICD-10-CM | POA: Diagnosis present

## 2014-06-05 DIAGNOSIS — I059 Rheumatic mitral valve disease, unspecified: Secondary | ICD-10-CM

## 2014-06-05 DIAGNOSIS — I10 Essential (primary) hypertension: Secondary | ICD-10-CM | POA: Diagnosis present

## 2014-06-05 DIAGNOSIS — K449 Diaphragmatic hernia without obstruction or gangrene: Secondary | ICD-10-CM | POA: Diagnosis present

## 2014-06-05 DIAGNOSIS — D62 Acute posthemorrhagic anemia: Secondary | ICD-10-CM | POA: Diagnosis not present

## 2014-06-05 DIAGNOSIS — Z8249 Family history of ischemic heart disease and other diseases of the circulatory system: Secondary | ICD-10-CM | POA: Diagnosis not present

## 2014-06-05 DIAGNOSIS — I498 Other specified cardiac arrhythmias: Secondary | ICD-10-CM | POA: Diagnosis not present

## 2014-06-05 DIAGNOSIS — I447 Left bundle-branch block, unspecified: Secondary | ICD-10-CM | POA: Diagnosis present

## 2014-06-05 DIAGNOSIS — N179 Acute kidney failure, unspecified: Secondary | ICD-10-CM | POA: Diagnosis present

## 2014-06-05 DIAGNOSIS — L259 Unspecified contact dermatitis, unspecified cause: Secondary | ICD-10-CM | POA: Diagnosis present

## 2014-06-05 DIAGNOSIS — I5021 Acute systolic (congestive) heart failure: Secondary | ICD-10-CM | POA: Diagnosis present

## 2014-06-05 DIAGNOSIS — D689 Coagulation defect, unspecified: Secondary | ICD-10-CM | POA: Diagnosis not present

## 2014-06-05 DIAGNOSIS — K5900 Constipation, unspecified: Secondary | ICD-10-CM | POA: Diagnosis not present

## 2014-06-05 DIAGNOSIS — I9589 Other hypotension: Secondary | ICD-10-CM | POA: Diagnosis not present

## 2014-06-05 HISTORY — PX: TEE WITHOUT CARDIOVERSION: SHX5443

## 2014-06-05 HISTORY — PX: CARDIOVERSION: SHX1299

## 2014-06-05 LAB — BASIC METABOLIC PANEL
ANION GAP: 12 (ref 5–15)
ANION GAP: 14 (ref 5–15)
BUN: 24 mg/dL — ABNORMAL HIGH (ref 6–23)
BUN: 23 mg/dL (ref 6–23)
CALCIUM: 8.3 mg/dL — AB (ref 8.4–10.5)
CHLORIDE: 104 meq/L (ref 96–112)
CO2: 27 mEq/L (ref 19–32)
CO2: 22 meq/L (ref 19–32)
CREATININE: 1.27 mg/dL (ref 0.50–1.35)
CREATININE: 1.22 mg/dL (ref 0.50–1.35)
Calcium: 8.5 mg/dL (ref 8.4–10.5)
Chloride: 103 mEq/L (ref 96–112)
GFR calc Af Amer: 70 mL/min — ABNORMAL LOW (ref >90)
GFR calc non Af Amer: 57 mL/min — ABNORMAL LOW (ref >90)
GFR, EST AFRICAN AMERICAN: 66 mL/min — AB (ref >90)
GFR, EST NON AFRICAN AMERICAN: 60 mL/min — AB (ref >90)
Glucose, Bld: 113 mg/dL — ABNORMAL HIGH (ref 70–99)
Glucose, Bld: 180 mg/dL — ABNORMAL HIGH (ref 70–99)
Potassium: 3.9 mEq/L (ref 3.7–5.3)
Potassium: 4.3 mEq/L (ref 3.7–5.3)
SODIUM: 139 meq/L (ref 137–147)
Sodium: 143 mEq/L (ref 137–147)

## 2014-06-05 LAB — MAGNESIUM
Magnesium: 2.2 mg/dL (ref 1.5–2.5)
Magnesium: 2 mg/dL (ref 1.5–2.5)

## 2014-06-05 SURGERY — ECHOCARDIOGRAM, TRANSESOPHAGEAL
Anesthesia: Monitor Anesthesia Care

## 2014-06-05 MED ORDER — LIDOCAINE VISCOUS 2 % MT SOLN
OROMUCOSAL | Status: AC
  Filled 2014-06-05: qty 15

## 2014-06-05 MED ORDER — SODIUM CHLORIDE 0.9 % IV SOLN: 250.0000 mL | INTRAVENOUS | Status: DC | PRN

## 2014-06-05 MED ORDER — LIDOCAINE HCL (CARDIAC) 20 MG/ML IV SOLN
INTRAVENOUS | Status: DC | PRN
  Administered 2014-06-05: 30 mg via INTRAVENOUS

## 2014-06-05 MED ORDER — DOFETILIDE 500 MCG PO CAPS
500.0000 ug | ORAL_CAPSULE | Freq: Two times a day (BID) | ORAL | Status: DC
  Administered 2014-06-06 – 2014-06-09 (×5): 500 ug via ORAL
  Filled 2014-06-05 (×9): qty 1

## 2014-06-05 MED ORDER — BUTAMBEN-TETRACAINE-BENZOCAINE 2-2-14 % EX AERO
INHALATION_SPRAY | CUTANEOUS | Status: DC | PRN
  Administered 2014-06-05: 2 via TOPICAL

## 2014-06-05 MED ORDER — DOFETILIDE 500 MCG PO CAPS
500.0000 ug | ORAL_CAPSULE | Freq: Two times a day (BID) | ORAL | Status: DC
  Administered 2014-06-05: 500 ug via ORAL
  Filled 2014-06-05 (×2): qty 1

## 2014-06-05 MED ORDER — POTASSIUM CHLORIDE CRYS ER 20 MEQ PO TBCR
20.0000 meq | EXTENDED_RELEASE_TABLET | Freq: Once | ORAL | Status: AC
  Administered 2014-06-05: 20 meq via ORAL

## 2014-06-05 MED ORDER — APIXABAN 5 MG PO TABS
5.0000 mg | ORAL_TABLET | Freq: Two times a day (BID) | ORAL | Status: DC
  Administered 2014-06-06 (×2): 5 mg via ORAL
  Filled 2014-06-05 (×5): qty 1

## 2014-06-05 MED ORDER — MIDAZOLAM HCL 5 MG/5ML IJ SOLN
INTRAMUSCULAR | Status: DC | PRN
  Administered 2014-06-05: 1 mg via INTRAVENOUS
  Administered 2014-06-05 (×2): 0.5 mg via INTRAVENOUS

## 2014-06-05 MED ORDER — SODIUM CHLORIDE 0.9 % IJ SOLN
3.0000 mL | Freq: Two times a day (BID) | INTRAMUSCULAR | Status: DC
  Administered 2014-06-06: 3 mL via INTRAVENOUS

## 2014-06-05 MED ORDER — SODIUM CHLORIDE 0.9 % IV SOLN
INTRAVENOUS | Status: DC | PRN
  Administered 2014-06-05: 12:00:00 via INTRAVENOUS

## 2014-06-05 MED ORDER — PROPOFOL 10 MG/ML IV BOLUS
INTRAVENOUS | Status: DC | PRN
  Administered 2014-06-05 (×3): 20 mg via INTRAVENOUS
  Administered 2014-06-05: 10 mg via INTRAVENOUS

## 2014-06-05 MED ORDER — SODIUM CHLORIDE 0.9 % IJ SOLN: 3.0000 mL | INTRAMUSCULAR | Status: DC | PRN

## 2014-06-05 NOTE — Anesthesia Postprocedure Evaluation (Signed)
  Anesthesia Post-op Note  Patient: Bryan Moran  Procedure(s) Performed: Procedure(s): TRANSESOPHAGEAL ECHOCARDIOGRAM (TEE) (N/A) CARDIOVERSION (N/A)  Patient Location: Endoscopy Unit  Anesthesia Type:MAC  Level of Consciousness: awake and alert   Airway and Oxygen Therapy: Patient Spontanous Breathing  Post-op Pain: none  Post-op Assessment: Post-op Vital signs reviewed, Patient's Cardiovascular Status Stable, Respiratory Function Stable, Patent Airway, No signs of Nausea or vomiting and Pain level controlled  Post-op Vital Signs: Reviewed and stable  Last Vitals:  Filed Vitals:   06/05/14 1407  BP: 145/84  Pulse: 65  Temp: 36.5 C  Resp: 18    Complications: No apparent anesthesia complications

## 2014-06-05 NOTE — Progress Notes (Signed)
 SUBJECTIVE: The patient is doing well today.  At this time, he denies chest pain, shortness of breath, or any new concerns.  V rates are still elevated but improved.  Less SOB today.  Echocardiogram has demonstrated EF 15-20%, moderate concentric hypertrophy, diffuse hypokinesis, grade 2 diastolic dysfunction, moderate MR, biatrial enlargement (LA 53).   He denies prior anginal symptoms, no previous syncope.  He reports that he snores, no prior sleep study.   I have requested last 3 EKG's from PCP's office to be faxed for review.   CURRENT MEDICATIONS: . apixaban  5 mg Oral BID  . carvedilol  12.5 mg Oral BID WC  . furosemide  40 mg Intravenous BID  . lisinopril  5 mg Oral Daily  . potassium chloride  40 mEq Oral Daily  . sodium chloride  3 mL Intravenous Q12H   . sodium chloride Stopped (06/04/14 0800)    OBJECTIVE: Physical Exam: Filed Vitals:   06/04/14 1336 06/04/14 2043 06/05/14 0500 06/05/14 0505  BP: 122/90 104/78  131/92  Pulse: 100 86  104  Temp: 97.8 F (36.6 C) 97.8 F (36.6 C)  97.5 F (36.4 C)  TempSrc: Oral Oral  Oral  Resp: 20 20  20  Height:      Weight:   258 lb 9.6 oz (117.3 kg)   SpO2: 92% 97%  90%    Intake/Output Summary (Last 24 hours) at 06/05/14 0820 Last data filed at 06/04/14 1630  Gross per 24 hour  Intake    483 ml  Output      0 ml  Net    483 ml    Telemetry reveals afib, V rates 100-120 bpm  GEN- The patient is well appearing, alert and oriented x 3 today.   Head- normocephalic, atraumatic Eyes-  Sclera clear, conjunctiva pink Ears- hearing intact Oropharynx- clear Neck- supple, elevated JVP Lungs- Clear to ausculation bilaterally, normal work of breathing Heart- irregular rate and rhythm, no murmurs, rubs or gallops, PMI not laterally displaced GI- soft, NT, ND, + BS Extremities- no clubbing, cyanosis, + dependant edema Skin- no rash or lesion Psych- euthymic mood, full affect Neuro- strength and sensation are  intact  LABS: Basic Metabolic Panel:  Recent Labs  06/02/14 1710  06/04/14 1925 06/05/14 0500  NA 145  < > 139 143  K 4.2  < > 3.9 3.9  CL 107  < > 101 104  CO2 22  < > 27 27  GLUCOSE 95  < > 129* 113*  BUN 29*  < > 25* 24*  CREATININE 1.33  < > 1.43* 1.27  CALCIUM 8.3*  < > 8.4 8.5  MG 2.2  --   --  2.2  < > = values in this interval not displayed. Liver Function Tests:  Recent Labs  06/02/14 1710  AST 41*  ALT 113*  ALKPHOS 48  BILITOT 2.3*  PROT 5.5*  ALBUMIN 3.1*   CBC:  Recent Labs  06/02/14 1038 06/02/14 1050 06/03/14 0520  WBC 7.9  --  9.0  NEUTROABS 6.5  --   --   HGB 16.1 16.3 15.6  HCT 47.0 48.0 47.3  MCV 94.9  --  96.3  PLT 225  --  216   Thyroid Function Tests:  Recent Labs  06/02/14 1710  TSH 1.900   RADIOLOGY: Dg Chest Portable 1 View 06/02/2014   CLINICAL DATA:  Shortness of breath, fatigue, history of atrial fibrillation.  EXAM: PORTABLE CHEST - 1 VIEW  COMPARISON:    None.  FINDINGS: Markedly enlarged cardiac silhouette and mediastinal contours. Pulmonary venous congestion without frank evidence of edema. Bibasilar heterogeneous opacities, left greater than right. No definite pleural effusion, though note, the bilateral costophrenic angles are excluded from view. No pneumothorax. Unchanged bones.  IMPRESSION: Marked cardiomegaly and pulmonary venous congestion without frank evidence of edema. Further evaluation with a PA and lateral chest radiograph may be obtained as clinically indicated.   Electronically Signed   By: Simonne Come M.D.   On: 06/02/2014 10:54    ASSESSMENT AND PLAN:  Active Problems:   Persistent atrial fibrillation  1. afib Continue eliquis TEE guided cardioversion today followed by initiation of tikosyn. Therapeutic strategies for afib including tikosyn and amiodarone were discussed in detail with the patient today.  He would prefer to try tikosyn first.  I have reviewed his EKG.  In the setting of LBBB, his QT is mildly  prolonged.  I think that we should proceed with tikosyn and follow QT closely in sinus.   2. LBBB Follow closely with rate control  3. Acute systolic dysfunction Likely due to afib with RVR Will achieve sinus and then titrate medicines for chf Switch diltiazem to coreg Add lisinopril today No real ischemic symptoms--> consider outpatient myoview once in sinus Repeat echo in 3 months Diurese today  3. HTN Stable  4. Moderate MR Will further evaluate by TEE

## 2014-06-05 NOTE — Progress Notes (Signed)
Utilization review completed.  

## 2014-06-05 NOTE — CV Procedure (Addendum)
        PROCEDURE NOTE  Procedure:  Transesophageal echocardiogram Operator:  Traci Turner,MD Indications:  Atrial fibrillation Complications:None IV meds:Propofol 20mg  IV  Results: Dilated LV with severely reduced LVF EF 15-20% Moderately dilated RV Moderately to severely dilated RA Severely dilated LA with mild spontaneous echo contrast Normal LA appendage with no evidence of thrombus in the LA or LAA Normal trileaflet AV with mild to moderate eccentric AR towards the anterior MV leaflet Normal MV with moderate central MR Normal TV with mild to moderate TR Normal thoracic and ascending aorta Normal interatrial septum No evidence of intracardiac thrombus  The patient tolerated the procedure well and went on to DCCV.   Electrical Cardioversion Procedure Note Bryan Moran [id number] 07/06/1946  Procedure: Electrical Cardioversion Indications:  Atrial Fibrillation  Time Out: Verified patient identification, verified procedure,medications/allergies/relevent history reviewed, required imaging and test results available.  Performed  Procedure Details  The patient was NPO after midnight. Anesthesia was administered at the beside  by Dr.Moser with 50mg  of propofol.  Cardioversion was done with synchronized biphasic defibrillation with AP pads with 150watts.  The patient did not convert to normal sinus rhythm. Cardioversion was done with synchronized biphasic defibrillation with AP pads with 200watts.  The patient converted to NSR. The patient tolerated the procedure well   IMPRESSION:  Successful cardioversion of atrial fibrillation Shortly after DCCV to NSR he went into atrial fibrillation at 117bpm.  Discussed with Dr. Johney Frame. Will start Tikosyn BID and plan for repeat DCCV on Friday.    TURNER,TRACI R 06/05/2014, 12:48 PM

## 2014-06-05 NOTE — Transfer of Care (Signed)
Immediate Anesthesia Transfer of Care Note  Patient: Bryan Moran  Procedure(s) Performed: Procedure(s): TRANSESOPHAGEAL ECHOCARDIOGRAM (TEE) (N/A) CARDIOVERSION (N/A)  Patient Location: Endoscopy Unit  Anesthesia Type:MAC  Level of Consciousness: awake  Airway & Oxygen Therapy: Patient Spontanous Breathing and Patient connected to nasal cannula oxygen  Post-op Assessment: Report given to PACU RN and Post -op Vital signs reviewed and stable  Post vital signs: Reviewed and stable  Complications: No apparent anesthesia complications

## 2014-06-05 NOTE — Progress Notes (Signed)
  Echocardiogram Echocardiogram Transesophageal has been performed.  Arvil Chaco 06/05/2014, 12:54 PM

## 2014-06-05 NOTE — H&P (View-Only) (Signed)
SUBJECTIVE: The patient is doing well today.  At this time, he denies chest pain, shortness of breath, or any new concerns.  V rates are still elevated but improved.  Less SOB today.  Echocardiogram has demonstrated EF 15-20%, moderate concentric hypertrophy, diffuse hypokinesis, grade 2 diastolic dysfunction, moderate MR, biatrial enlargement (LA 53).   He denies prior anginal symptoms, no previous syncope.  He reports that he snores, no prior sleep study.   I have requested last 3 EKG's from PCP's office to be faxed for review.   CURRENT MEDICATIONS: . apixaban  5 mg Oral BID  . carvedilol  12.5 mg Oral BID WC  . furosemide  40 mg Intravenous BID  . lisinopril  5 mg Oral Daily  . potassium chloride  40 mEq Oral Daily  . sodium chloride  3 mL Intravenous Q12H   . sodium chloride Stopped (06/04/14 0800)    OBJECTIVE: Physical Exam: Filed Vitals:   06/04/14 1336 06/04/14 2043 06/05/14 0500 06/05/14 0505  BP: 122/90 104/78  131/92  Pulse: 100 86  104  Temp: 97.8 F (36.6 C) 97.8 F (36.6 C)  97.5 F (36.4 C)  TempSrc: Oral Oral  Oral  Resp: Height:      Weight:   258 lb 9.6 oz (117.3 kg)   SpO2: 92% 97%  90%    Intake/Output Summary (Last 24 hours) at 06/05/14 0820 Last data filed at 06/04/14 1630  Gross per 24 hour  Intake    483 ml  Output      0 ml  Net    483 ml    Telemetry reveals afib, V rates 100-120 bpm  GEN- The patient is well appearing, alert and oriented x 3 today.   Head- normocephalic, atraumatic Eyes-  Sclera clear, conjunctiva pink Ears- hearing intact Oropharynx- clear Neck- supple, elevated JVP Lungs- Clear to ausculation bilaterally, normal work of breathing Heart- irregular rate and rhythm, no murmurs, rubs or gallops, PMI not laterally displaced GI- soft, NT, ND, + BS Extremities- no clubbing, cyanosis, + dependant edema Skin- no rash or lesion Psych- euthymic mood, full affect Neuro- strength and sensation are  intact  LABS: Basic Metabolic Panel:  Recent Labs  16/10/96 1710  06/04/14 1925 06/05/14 0500  NA 145  < > 139 143  K 4.2  < > 3.9 3.9  CL 107  < > 101 104  CO2 22  < > 27 27  GLUCOSE 95  < > 129* 113*  BUN 29*  < > 25* 24*  CREATININE 1.33  < > 1.43* 1.27  CALCIUM 8.3*  < > 8.4 8.5  MG 2.2  --   --  2.2  < > = values in this interval not displayed. Liver Function Tests:  Recent Labs  06/02/14 1710  AST 41*  ALT 113*  ALKPHOS 48  BILITOT 2.3*  PROT 5.5*  ALBUMIN 3.1*   CBC:  Recent Labs  06/02/14 1038 06/02/14 1050 06/03/14 0520  WBC 7.9  --  9.0  NEUTROABS 6.5  --   --   HGB 16.1 16.3 15.6  HCT 47.0 48.0 47.3  MCV 94.9  --  96.3  PLT 225  --  216   Thyroid Function Tests:  Recent Labs  06/02/14 1710  TSH 1.900   RADIOLOGY: Dg Chest Portable 1 View 06/02/2014   CLINICAL DATA:  Shortness of breath, fatigue, history of atrial fibrillation.  EXAM: PORTABLE CHEST - 1 VIEW  COMPARISON:  None.  FINDINGS: Markedly enlarged cardiac silhouette and mediastinal contours. Pulmonary venous congestion without frank evidence of edema. Bibasilar heterogeneous opacities, left greater than right. No definite pleural effusion, though note, the bilateral costophrenic angles are excluded from view. No pneumothorax. Unchanged bones.  IMPRESSION: Marked cardiomegaly and pulmonary venous congestion without frank evidence of edema. Further evaluation with a PA and lateral chest radiograph may be obtained as clinically indicated.   Electronically Signed   By: Simonne Come M.D.   On: 06/02/2014 10:54    ASSESSMENT AND PLAN:  Active Problems:   Persistent atrial fibrillation  1. afib Continue eliquis TEE guided cardioversion today followed by initiation of tikosyn. Therapeutic strategies for afib including tikosyn and amiodarone were discussed in detail with the patient today.  He would prefer to try tikosyn first.  I have reviewed his EKG.  In the setting of LBBB, his QT is mildly  prolonged.  I think that we should proceed with tikosyn and follow QT closely in sinus.   2. LBBB Follow closely with rate control  3. Acute systolic dysfunction Likely due to afib with RVR Will achieve sinus and then titrate medicines for chf Switch diltiazem to coreg Add lisinopril today No real ischemic symptoms--> consider outpatient myoview once in sinus Repeat echo in 3 months Diurese today  3. HTN Stable  4. Moderate MR Will further evaluate by TEE

## 2014-06-05 NOTE — Progress Notes (Signed)
Late Entry: Noted some redness to mid abdomen, left upper chest some irritation and 2 small open area to lower left breast   . Floor nurse made aware.

## 2014-06-05 NOTE — Anesthesia Preprocedure Evaluation (Addendum)
Anesthesia Evaluation  Patient identified by MRN, date of birth, ID band Patient awake    Reviewed: Allergy & Precautions, H&P , NPO status , Patient's Chart, lab work & pertinent test results  History of Anesthesia Complications Negative for: history of anesthetic complications  Airway Mallampati: I TM Distance: >3 FB Neck ROM: Full    Dental  (+) Teeth Intact, Dental Advisory Given   Pulmonary neg sleep apnea, neg COPD breath sounds clear to auscultation        Cardiovascular hypertension, Pt. on medications - angina+CHF - Past MI + dysrhythmias Atrial Fibrillation Rhythm:Irregular  Ef 15%, diffuse hypokinesis   Neuro/Psych negative neurological ROS  negative psych ROS   GI/Hepatic negative GI ROS, Neg liver ROS,   Endo/Other  neg diabetesMorbid obesity  Renal/GU Renal InsufficiencyRenal disease     Musculoskeletal   Abdominal   Peds  Hematology negative hematology ROS (+)   Anesthesia Other Findings   Reproductive/Obstetrics                          Anesthesia Physical Anesthesia Plan  ASA: III  Anesthesia Plan: MAC   Post-op Pain Management:    Induction: Intravenous  Airway Management Planned: Natural Airway and Simple Face Mask  Additional Equipment: None  Intra-op Plan:   Post-operative Plan:   Informed Consent: I have reviewed the patients History and Physical, chart, labs and discussed the procedure including the risks, benefits and alternatives for the proposed anesthesia with the patient or authorized representative who has indicated his/her understanding and acceptance.   Dental advisory given  Plan Discussed with: CRNA, Surgeon and Anesthesiologist  Anesthesia Plan Comments:        Anesthesia Quick Evaluation

## 2014-06-05 NOTE — Interval H&P Note (Signed)
History and Physical Interval Note:  06/05/2014 12:17 PM  Bryan Moran  has presented today for surgery, with the diagnosis of afib  The various methods of treatment have been discussed with the patient and family. After consideration of risks, benefits and other options for treatment, the patient has consented to  Procedure(s): TRANSESOPHAGEAL ECHOCARDIOGRAM (TEE) (N/A) CARDIOVERSION (N/A) as a surgical intervention .  The patient's history has been reviewed, patient examined, no change in status, stable for surgery.  I have reviewed the patient's chart and labs.  Questions were answered to the patient's satisfaction.     TURNER,TRACI R

## 2014-06-06 ENCOUNTER — Encounter (HOSPITAL_COMMUNITY): Payer: Self-pay | Admitting: Cardiology

## 2014-06-06 LAB — BASIC METABOLIC PANEL
Anion gap: 13 (ref 5–15)
BUN: 25 mg/dL — AB (ref 6–23)
CO2: 26 mEq/L (ref 19–32)
CREATININE: 1.24 mg/dL (ref 0.50–1.35)
Calcium: 8.2 mg/dL — ABNORMAL LOW (ref 8.4–10.5)
Chloride: 103 mEq/L (ref 96–112)
GFR calc Af Amer: 68 mL/min — ABNORMAL LOW (ref >90)
GFR calc non Af Amer: 59 mL/min — ABNORMAL LOW (ref >90)
GLUCOSE: 111 mg/dL — AB (ref 70–99)
POTASSIUM: 3.6 meq/L — AB (ref 3.7–5.3)
Sodium: 142 mEq/L (ref 137–147)

## 2014-06-06 LAB — MAGNESIUM: Magnesium: 2.1 mg/dL (ref 1.5–2.5)

## 2014-06-06 MED ORDER — FUROSEMIDE 10 MG/ML IJ SOLN
40.0000 mg | Freq: Every day | INTRAMUSCULAR | Status: DC
  Administered 2014-06-06: 40 mg via INTRAVENOUS
  Filled 2014-06-06 (×2): qty 4

## 2014-06-06 MED ORDER — POTASSIUM CHLORIDE CRYS ER 20 MEQ PO TBCR
40.0000 meq | EXTENDED_RELEASE_TABLET | Freq: Once | ORAL | Status: AC
  Administered 2014-06-06: 40 meq via ORAL
  Filled 2014-06-06: qty 2

## 2014-06-06 MED ORDER — POTASSIUM CHLORIDE CRYS ER 20 MEQ PO TBCR
40.0000 meq | EXTENDED_RELEASE_TABLET | Freq: Every day | ORAL | Status: DC
  Administered 2014-06-06: 40 meq via ORAL
  Filled 2014-06-06 (×2): qty 2

## 2014-06-06 MED ORDER — CARVEDILOL 6.25 MG PO TABS
6.2500 mg | ORAL_TABLET | Freq: Two times a day (BID) | ORAL | Status: DC
  Administered 2014-06-06: 6.25 mg via ORAL
  Filled 2014-06-06 (×4): qty 1

## 2014-06-06 NOTE — Progress Notes (Signed)
Noted that the baseline QTc on patient's EKG from today was 540.  Checked with Dr. Allena Katz who was on call and he re-calculated the QTc to be 0.45.  Instructed to go ahead with Tikosyn administration this evening.  Will continue to monitor.  Arva Chafe

## 2014-06-06 NOTE — Progress Notes (Signed)
SUBJECTIVE: The patient is doing well today.  At this time, he denies chest pain, shortness of breath, or any new concerns.  Shortness of breath improved.   Echocardiogram has demonstrated EF 15-20%, moderate concentric hypertrophy, diffuse hypokinesis, grade 2 diastolic dysfunction, moderate MR, biatrial enlargement (LA 53).   He denies prior anginal symptoms, no previous syncope.  He reports that he snores, no prior sleep study.   TEE 06-05-14 demonstrated EF 15-20%, moderately dilated RV, biatrial enlargement, moderate central MR, moderate TR.  DCCV following TEE restored SR but patient had ERAF.  Tikosyn initiated 06-05-14 pm.  Pt converted to SR last night around 2200.   Labs pending this morning.  QTc stable  I/O incomplete, weight down 8 pounds since admission.   CURRENT MEDICATIONS: . apixaban  5 mg Oral Q12H  . carvedilol  12.5 mg Oral BID WC  . dofetilide  500 mcg Oral Q12H  . furosemide  40 mg Intravenous BID  . lisinopril  5 mg Oral Daily  . potassium chloride  40 mEq Oral Daily  . sodium chloride  3 mL Intravenous Q12H  . sodium chloride  3 mL Intravenous Q12H      OBJECTIVE: Physical Exam: Filed Vitals:   06/05/14 1351 06/05/14 1407 06/05/14 2043 06/06/14 0532  BP: 122/88 145/84 105/80 121/88  Pulse: 66 65 92 56  Temp:  97.7 F (36.5 C) 97.5 F (36.4 C) 97.6 F (36.4 C)  TempSrc:  Oral Oral Axillary  Resp:  Height:      Weight:    257 lb 4.4 oz (116.7 kg)  SpO2: 99% 100% 95% 96%    Intake/Output Summary (Last 24 hours) at 06/06/14 6644 Last data filed at 06/05/14 1630  Gross per 24 hour  Intake    660 ml  Output      0 ml  Net    660 ml    Telemetry reveals afib with conversion to SR with frequent PAC's and rate related BBB  GEN- The patient is well appearing, alert and oriented x 3 today.   Head- normocephalic, atraumatic Eyes-  Sclera clear, conjunctiva pink Ears- hearing intact Oropharynx- clear Neck- supple, elevated JVP Lungs-  Clear to ausculation bilaterally, normal work of breathing Heart- regular rate and rhythm with frequent ectopy, no murmurs, rubs or gallops, PMI not laterally displaced GI- soft, NT, ND, + BS Extremities- no clubbing, cyanosis, + dependant edema (improved) Skin- no rash or lesion Psych- euthymic mood, full affect Neuro- strength and sensation are intact  LABS: Basic Metabolic Panel:  Recent Labs  03/47/42 0500 06/05/14 1641  NA 143 139  K 3.9 4.3  CL 104 103  CO2 27 22  GLUCOSE 113* 180*  BUN 24* 23  CREATININE 1.27 1.22  CALCIUM 8.5 8.3*  MG 2.2 2.0   RADIOLOGY: Dg Chest Portable 1 View 06/02/2014   CLINICAL DATA:  Shortness of breath, fatigue, history of atrial fibrillation.  EXAM: PORTABLE CHEST - 1 VIEW  COMPARISON:  None.  FINDINGS: Markedly enlarged cardiac silhouette and mediastinal contours. Pulmonary venous congestion without frank evidence of edema. Bibasilar heterogeneous opacities, left greater than right. No definite pleural effusion, though note, the bilateral costophrenic angles are excluded from view. No pneumothorax. Unchanged bones.  IMPRESSION: Marked cardiomegaly and pulmonary venous congestion without frank evidence of edema. Further evaluation with a PA and lateral chest radiograph may be obtained as clinically indicated.   Electronically Signed   By: Simonne Come M.D.   On:  06/02/2014 10:54    ASSESSMENT AND PLAN:  Active Problems:   Persistent atrial fibrillation   Atrial fibrillation  1. afib Continue eliquis Now in sinus Follow QTc on tikosyn Reduce coreg due to bradycardia  2. LBBB Follow closely with rate control  3. Acute systolic dysfunction Likely due to afib with RVR Will achieve sinus and then titrate medicines for chf Titrate lisinopril as BP allows No real ischemic symptoms--> consider outpatient myoview once in sinus Repeat echo in 3 months Reduce lasix to 40mg  daily  3. HTN Stable  4. Moderate MR Follow clinically

## 2014-06-06 NOTE — Progress Notes (Signed)
EKG from 0100 shows that the patient is now Sinus Huston Foley 57 with PACs and PVCs.  QTc is measured as 513.  Will continue to monitor.  Arva Chafe

## 2014-06-07 ENCOUNTER — Encounter (HOSPITAL_COMMUNITY): Payer: Self-pay

## 2014-06-07 ENCOUNTER — Ambulatory Visit (HOSPITAL_COMMUNITY): Admission: RE | Admit: 2014-06-07 | Payer: BC Managed Care – PPO | Source: Ambulatory Visit | Admitting: Cardiology

## 2014-06-07 ENCOUNTER — Inpatient Hospital Stay (HOSPITAL_COMMUNITY): Payer: BC Managed Care – PPO

## 2014-06-07 ENCOUNTER — Encounter (HOSPITAL_COMMUNITY)
Admission: EM | Disposition: A | Discharge status: Home / Self Care | Payer: Self-pay | Source: Home / Self Care | Attending: Internal Medicine

## 2014-06-07 ENCOUNTER — Encounter (HOSPITAL_COMMUNITY)
Admission: EM | Disposition: A | Discharge status: Home / Self Care | Payer: BC Managed Care – PPO | Source: Home / Self Care | Attending: Internal Medicine

## 2014-06-07 DIAGNOSIS — D689 Coagulation defect, unspecified: Secondary | ICD-10-CM

## 2014-06-07 DIAGNOSIS — R110 Nausea: Secondary | ICD-10-CM

## 2014-06-07 DIAGNOSIS — K920 Hematemesis: Secondary | ICD-10-CM | POA: Diagnosis present

## 2014-06-07 HISTORY — PX: ESOPHAGOGASTRODUODENOSCOPY: SHX5428

## 2014-06-07 LAB — BASIC METABOLIC PANEL
Anion gap: 11 (ref 5–15)
Anion gap: 11 (ref 5–15)
BUN: 30 mg/dL — AB (ref 6–23)
BUN: 38 mg/dL — AB (ref 6–23)
CHLORIDE: 109 meq/L (ref 96–112)
CO2: 25 meq/L (ref 19–32)
CO2: 23 mEq/L (ref 19–32)
Calcium: 8.3 mg/dL — ABNORMAL LOW (ref 8.4–10.5)
Calcium: 7.7 mg/dL — ABNORMAL LOW (ref 8.4–10.5)
Chloride: 107 mEq/L (ref 96–112)
Creatinine, Ser: 1.12 mg/dL (ref 0.50–1.35)
Creatinine, Ser: 1.09 mg/dL (ref 0.50–1.35)
GFR calc Af Amer: 77 mL/min — ABNORMAL LOW (ref >90)
GFR calc Af Amer: 80 mL/min — ABNORMAL LOW (ref >90)
GFR calc non Af Amer: 67 mL/min — ABNORMAL LOW (ref >90)
GFR calc non Af Amer: 69 mL/min — ABNORMAL LOW (ref >90)
GLUCOSE: 113 mg/dL — AB (ref 70–99)
Glucose, Bld: 184 mg/dL — ABNORMAL HIGH (ref 70–99)
POTASSIUM: 4.3 meq/L (ref 3.7–5.3)
POTASSIUM: 4.7 meq/L (ref 3.7–5.3)
Sodium: 143 mEq/L (ref 137–147)
Sodium: 143 mEq/L (ref 137–147)

## 2014-06-07 LAB — POCT I-STAT 3, ART BLOOD GAS (G3+)
Acid-Base Excess: 1 mmol/L (ref 0.0–2.0)
BICARBONATE: 26 meq/L — AB (ref 20.0–24.0)
O2 Saturation: 96 %
PH ART: 7.416 (ref 7.350–7.450)
PO2 ART: 78 mmHg — AB (ref 80.0–100.0)
Patient temperature: 97.8
TCO2: 27 mmol/L (ref 0–100)
pCO2 arterial: 40.2 mmHg (ref 35.0–45.0)

## 2014-06-07 LAB — TROPONIN I: Troponin I: <0.3 ng/mL (ref <0.30)

## 2014-06-07 LAB — CBC
HCT: 42.1 % (ref 39.0–52.0)
HEMATOCRIT: 34.4 % — AB (ref 39.0–52.0)
HEMOGLOBIN: 11.5 g/dL — AB (ref 13.0–17.0)
Hemoglobin: 14 g/dL (ref 13.0–17.0)
MCH: 32.5 pg (ref 26.0–34.0)
MCH: 32.2 pg (ref 26.0–34.0)
MCHC: 33.3 g/dL (ref 30.0–36.0)
MCHC: 33.4 g/dL (ref 30.0–36.0)
MCV: 97.7 fL (ref 78.0–100.0)
MCV: 96.4 fL (ref 78.0–100.0)
Platelets: 195 10*3/uL (ref 150–400)
Platelets: 194 10*3/uL (ref 150–400)
RBC: 4.31 MIL/uL (ref 4.22–5.81)
RBC: 3.57 MIL/uL — ABNORMAL LOW (ref 4.22–5.81)
RDW: 14.7 % (ref 11.5–15.5)
RDW: 14.4 % (ref 11.5–15.5)
WBC: 9.2 10*3/uL (ref 4.0–10.5)
WBC: 12.2 10*3/uL — ABNORMAL HIGH (ref 4.0–10.5)

## 2014-06-07 LAB — PROTIME-INR
INR: 1.4 (ref 0.00–1.49)
Prothrombin Time: 17.2 seconds — ABNORMAL HIGH (ref 11.6–15.2)

## 2014-06-07 LAB — GLUCOSE, CAPILLARY: Glucose-Capillary: 176 mg/dL — ABNORMAL HIGH (ref 70–99)

## 2014-06-07 LAB — HEMOGLOBIN AND HEMATOCRIT, BLOOD
HCT: 40.5 % (ref 39.0–52.0)
HEMATOCRIT: 37.4 % — AB (ref 39.0–52.0)
Hemoglobin: 13.4 g/dL (ref 13.0–17.0)
Hemoglobin: 12 g/dL — ABNORMAL LOW (ref 13.0–17.0)

## 2014-06-07 LAB — PREPARE RBC (CROSSMATCH)

## 2014-06-07 LAB — MRSA PCR SCREENING: MRSA by PCR: NEGATIVE

## 2014-06-07 LAB — LACTIC ACID, PLASMA: Lactic Acid, Venous: 1.5 mmol/L (ref 0.5–2.2)

## 2014-06-07 LAB — MAGNESIUM: Magnesium: 2.1 mg/dL (ref 1.5–2.5)

## 2014-06-07 LAB — ABO/RH: ABO/RH(D): O POS

## 2014-06-07 SURGERY — CARDIOVERSION
Anesthesia: Monitor Anesthesia Care

## 2014-06-07 SURGERY — EGD (ESOPHAGOGASTRODUODENOSCOPY)
Anesthesia: Moderate Sedation

## 2014-06-07 MED ORDER — SODIUM CHLORIDE 0.9 % IV SOLN
80.0000 mg | Freq: Once | INTRAVENOUS | Status: AC
  Administered 2014-06-07: 80 mg via INTRAVENOUS
  Filled 2014-06-07: qty 80

## 2014-06-07 MED ORDER — IOHEXOL 300 MG/ML  SOLN
100.0000 mL | Freq: Once | INTRAMUSCULAR | Status: AC | PRN
  Administered 2014-06-07: 85 mL via INTRAVENOUS

## 2014-06-07 MED ORDER — FENTANYL CITRATE 0.05 MG/ML IJ SOLN
INTRAMUSCULAR | Status: AC
  Filled 2014-06-07: qty 4

## 2014-06-07 MED ORDER — BUTAMBEN-TETRACAINE-BENZOCAINE 2-2-14 % EX AERO
INHALATION_SPRAY | CUTANEOUS | Status: DC | PRN
  Administered 2014-06-07: 2 via TOPICAL

## 2014-06-07 MED ORDER — MIDAZOLAM HCL 2 MG/2ML IJ SOLN
INTRAMUSCULAR | Status: AC
  Filled 2014-06-07: qty 4

## 2014-06-07 MED ORDER — LIDOCAINE HCL 1 % IJ SOLN
INTRAMUSCULAR | Status: AC
  Filled 2014-06-07: qty 20

## 2014-06-07 MED ORDER — EPINEPHRINE HCL 0.1 MG/ML IJ SOSY
PREFILLED_SYRINGE | INTRAMUSCULAR | Status: AC
  Filled 2014-06-07: qty 10

## 2014-06-07 MED ORDER — CEFAZOLIN (ANCEF) 1 G IV SOLR
INTRAVENOUS | Status: AC | PRN
  Administered 2014-06-07: 2 g

## 2014-06-07 MED ORDER — FENTANYL CITRATE 0.05 MG/ML IJ SOLN
INTRAMUSCULAR | Status: AC | PRN
  Administered 2014-06-07 (×2): 25 ug via INTRAVENOUS

## 2014-06-07 MED ORDER — PANTOPRAZOLE SODIUM 40 MG IV SOLR: 40.0000 mg | Freq: Two times a day (BID) | INTRAVENOUS | Status: DC

## 2014-06-07 MED ORDER — SODIUM CHLORIDE 0.9 % IJ SOLN: 3.0000 mL | INTRAMUSCULAR | Status: DC | PRN

## 2014-06-07 MED ORDER — PROMETHAZINE HCL 25 MG/ML IJ SOLN: 12.5000 mg | Freq: Four times a day (QID) | INTRAMUSCULAR | Status: DC | PRN

## 2014-06-07 MED ORDER — MIDAZOLAM HCL 2 MG/2ML IJ SOLN
INTRAMUSCULAR | Status: AC | PRN
  Administered 2014-06-07 (×2): 0.5 mg via INTRAVENOUS

## 2014-06-07 MED ORDER — ATROPINE SULFATE 0.1 MG/ML IJ SOLN
INTRAMUSCULAR | Status: AC
  Filled 2014-06-07: qty 10

## 2014-06-07 MED ORDER — SODIUM CHLORIDE 0.9 % IJ SOLN: 3.0000 mL | Freq: Two times a day (BID) | INTRAMUSCULAR | Status: DC

## 2014-06-07 MED ORDER — MIDAZOLAM HCL 10 MG/2ML IJ SOLN
INTRAMUSCULAR | Status: DC | PRN
  Administered 2014-06-07: 1 mg via INTRAVENOUS
  Administered 2014-06-07 (×2): 2 mg via INTRAVENOUS

## 2014-06-07 MED ORDER — PANTOPRAZOLE SODIUM 40 MG IV SOLR
8.0000 mg/h | INTRAVENOUS | Status: AC
  Administered 2014-06-07 – 2014-06-10 (×6): 8 mg/h via INTRAVENOUS
  Filled 2014-06-07 (×13): qty 80

## 2014-06-07 MED ORDER — SODIUM CHLORIDE 0.9 % IV SOLN: Freq: Once | INTRAVENOUS | Status: DC

## 2014-06-07 MED ORDER — APIXABAN 5 MG PO TABS
5.0000 mg | ORAL_TABLET | Freq: Two times a day (BID) | ORAL | Status: DC
Start: 2014-06-07 — End: 2014-06-07
  Filled 2014-06-07 (×2): qty 1

## 2014-06-07 MED ORDER — SODIUM CHLORIDE 0.9 % IV SOLN: INTRAVENOUS | Status: DC

## 2014-06-07 MED ORDER — FENTANYL CITRATE 0.05 MG/ML IJ SOLN
INTRAMUSCULAR | Status: DC | PRN
Start: 2014-06-07 — End: 2014-06-07
  Administered 2014-06-07: 25 ug via INTRAVENOUS

## 2014-06-07 MED ORDER — SODIUM CHLORIDE 0.9 % IV BOLUS (SEPSIS): 250.0000 mL | Freq: Once | INTRAVENOUS | Status: DC

## 2014-06-07 MED ORDER — NOREPINEPHRINE BITARTRATE 1 MG/ML IV SOLN
2.0000 ug/min | INTRAVENOUS | Status: DC
  Administered 2014-06-07: 4 ug/min via INTRAVENOUS
  Filled 2014-06-07: qty 4

## 2014-06-07 MED ORDER — ONDANSETRON HCL 4 MG/2ML IJ SOLN
4.0000 mg | Freq: Once | INTRAMUSCULAR | Status: AC
  Administered 2014-06-07: 4 mg via INTRAVENOUS

## 2014-06-07 MED ORDER — SODIUM CHLORIDE 0.9 % IV SOLN: 250.0000 mL | INTRAVENOUS | Status: DC

## 2014-06-07 MED ORDER — NOREPINEPHRINE BITARTRATE 1 MG/ML IV SOLN
2.0000 ug/min | INTRAVENOUS | Status: DC
  Administered 2014-06-08: 12 ug/min via INTRAVENOUS
  Filled 2014-06-07: qty 16

## 2014-06-07 MED ORDER — MIDAZOLAM HCL 5 MG/ML IJ SOLN
INTRAMUSCULAR | Status: AC
  Filled 2014-06-07: qty 2

## 2014-06-07 MED ORDER — PANTOPRAZOLE SODIUM 40 MG IV SOLR
40.0000 mg | Freq: Two times a day (BID) | INTRAVENOUS | Status: DC
  Administered 2014-06-10 – 2014-06-14 (×8): 40 mg via INTRAVENOUS
  Filled 2014-06-07 (×9): qty 40

## 2014-06-07 MED ORDER — CEFAZOLIN SODIUM-DEXTROSE 2-3 GM-% IV SOLR
INTRAVENOUS | Status: AC
  Filled 2014-06-07: qty 50

## 2014-06-07 MED ORDER — FENTANYL CITRATE 0.05 MG/ML IJ SOLN
INTRAMUSCULAR | Status: AC
  Filled 2014-06-07: qty 2

## 2014-06-07 MED ORDER — SODIUM CHLORIDE 0.9 % IJ SOLN
PREFILLED_SYRINGE | INTRAMUSCULAR | Status: DC | PRN
  Administered 2014-06-07: 21:00:00

## 2014-06-07 NOTE — Sedation Documentation (Signed)
Report received from [place], [place]. Dr. Archer Asa at bedside.  Pt transferrred w/out difficulty to procedure table.

## 2014-06-07 NOTE — Consult Note (Signed)
Reason for Consult:UGIB Referring Physician: Dr Aris Everts is an 68 y.o. male.  HPI: asked to see pt at the request of Dr Benson Norway for upper GIB.  Pt admitted for a fib issues and has hx of Eloquis use for a fib.  Scheduled for endoscopy tomorrow but vomited blood and had episode of hypotension.  Endoscopy showed clot in pylorus and bleeding especially when manipulated. Denied any abdominal pain.    Past Medical History  Diagnosis Date  . Hypertension   . A-fib     a. Officially dx 05/27/14, sx may have been going on longer.  . Eczema   . Dysrhythmia     Past Surgical History  Procedure Laterality Date  . Hernia repair  [place] without cardioversion N/A 06/05/2014    Procedure: TRANSESOPHAGEAL ECHOCARDIOGRAM (TEE);  Surgeon: Sueanne Margarita, MD;  Location: [facility] ENDOSCOPY;  Service: Cardiovascular;  Laterality: N/A;  . Cardioversion N/A 06/05/2014    Procedure: CARDIOVERSION;  Surgeon: Sueanne Margarita, MD;  Location: [facility] ENDOSCOPY;  Service: Cardiovascular;  Laterality: N/A;    Family History  Problem Relation Age of Onset  . Depression Father   . Heart disease Father     Some sort of heart issue at end of life - possibly CAD  . Multiple sclerosis Mother   . Cancer Mother     Tumor in kidney, spread to bones    Social History:  reports that he has never smoked. He has never used smokeless tobacco. He reports that he does not drink alcohol or use illicit drugs.  Allergies: No Known Allergies  Medications: I have reviewed the patient's current medications.  Results for orders placed during the hospital encounter of 06/02/14 (from the past 48 hour(s))  MAGNESIUM     Status: None   Collection Time    06/06/14  5:52 AM      Result Value Ref Range   Magnesium 2.1  1.5 - 2.5 mg/dL  BASIC METABOLIC PANEL     Status: Abnormal   Collection Time    06/06/14  5:52 AM      Result Value Ref Range   Sodium 142  137 - 147 mEq/L   Potassium 3.6 (*) 3.7 - 5.3 mEq/L    Chloride 103  96 - 112 mEq/L   CO2 26  19 - 32 mEq/L   Glucose, Bld 111 (*) 70 - 99 mg/dL   BUN 25 (*) 6 - 23 mg/dL   Creatinine, Ser 1.24  0.50 - 1.35 mg/dL   Calcium 8.2 (*) 8.4 - 10.5 mg/dL   GFR calc non Af Amer 59 (*) >90 mL/min   GFR calc Af Amer 68 (*) >90 mL/min   Comment: (NOTE)     The eGFR has been calculated using the CKD EPI equation.     This calculation has not been validated in all clinical situations.     eGFR's persistently <90 mL/min signify possible Chronic Kidney     Disease.   Anion gap 13  5 - 15  MAGNESIUM     Status: None   Collection Time    06/07/14  5:00 AM      Result Value Ref Range   Magnesium 2.1  1.5 - 2.5 mg/dL  BASIC METABOLIC PANEL     Status: Abnormal   Collection Time    06/07/14  5:00 AM      Result Value Ref Range   Sodium 143  137 - 147 mEq/L   Potassium 4.3  3.7 - 5.3 mEq/L   Chloride 107  96 - 112 mEq/L   CO2 25  19 - 32 mEq/L   Glucose, Bld 113 (*) 70 - 99 mg/dL   BUN 30 (*) 6 - 23 mg/dL   Creatinine, Ser 1.12  0.50 - 1.35 mg/dL   Calcium 8.3 (*) 8.4 - 10.5 mg/dL   GFR calc non Af Amer 67 (*) >90 mL/min   GFR calc Af Amer 77 (*) >90 mL/min   Comment: (NOTE)     The eGFR has been calculated using the CKD EPI equation.     This calculation has not been validated in all clinical situations.     eGFR's persistently <90 mL/min signify possible Chronic Kidney     Disease.   Anion gap 11  5 - 15  CBC     Status: None   Collection Time    06/07/14  8:42 AM      Result Value Ref Range   WBC 9.2  4.0 - 10.5 K/uL   RBC 4.31  4.22 - 5.81 MIL/uL   Hemoglobin 14.0  13.0 - 17.0 g/dL   HCT 42.1  39.0 - 52.0 %   MCV 97.7  78.0 - 100.0 fL   MCH 32.5  26.0 - 34.0 pg   MCHC 33.3  30.0 - 36.0 g/dL   RDW 14.7  11.5 - 15.5 %   Platelets 195  150 - 400 K/uL  TYPE AND SCREEN     Status: None   Collection Time    06/07/14  9:30 AM      Result Value Ref Range   ABO/RH(D) O POS     Antibody Screen NEG     Sample Expiration 06/10/2014      Unit Number [id number]     Blood Component Type RBC LR PHER1     Unit division 00     Status of Unit ALLOCATED     Transfusion Status OK TO TRANSFUSE     Crossmatch Result Compatible     Unit Number [id number]     Blood Component Type RBC LR PHER2     Unit division 00     Status of Unit ALLOCATED     Transfusion Status OK TO TRANSFUSE     Crossmatch Result Compatible    ABO/RH     Status: None   Collection Time    06/07/14  9:30 AM      Result Value Ref Range   ABO/RH(D) O POS    MRSA PCR SCREENING     Status: None   Collection Time    06/07/14  1:03 PM      Result Value Ref Range   MRSA by PCR NEGATIVE  NEGATIVE   Comment:            The GeneXpert MRSA Assay (FDA     approved for NASAL specimens     only), is one component of a     comprehensive MRSA colonization     surveillance program. It is not     intended to diagnose MRSA     infection nor to guide or     monitor treatment for     MRSA infections.  HEMOGLOBIN AND HEMATOCRIT, BLOOD     Status: None   Collection Time    06/07/14  1:40 PM      Result Value Ref Range   Hemoglobin 13.4  13.0 - 17.0  g/dL   HCT 40.5  39.0 - 52.0 %  HEMOGLOBIN AND HEMATOCRIT, BLOOD     Status: Abnormal   Collection Time    06/07/14  6:38 PM      Result Value Ref Range   Hemoglobin 12.0 (*) 13.0 - 17.0 g/dL   HCT 37.4 (*) 39.0 - 52.0 %  PREPARE RBC (CROSSMATCH)     Status: None   Collection Time    06/07/14  8:00 PM      Result Value Ref Range   Order Confirmation ORDER PROCESSED BY BLOOD BANK      No results found.  Review of Systems  Unable to perform ROS  Blood pressure 86/54, pulse 68, temperature 97.8 F (36.6 C), temperature source Oral, resp. rate 18, height 6' 3"  (1.905 m), weight 255 lb 12.8 oz (116.03 kg), SpO2 98.00%. Physical Exam  Constitutional: He is oriented to person, place, and time. He appears well-developed and well-nourished.  HENT:  Head: Normocephalic.  Mouth/Throat: No oropharyngeal exudate.   Eyes: Pupils are equal, round, and reactive to light. No scleral icterus.  Neck: Normal range of motion. No tracheal deviation present.  Cardiovascular: Normal rate.   Respiratory: Effort normal and breath sounds normal.  GI: Soft. Bowel sounds are normal. He exhibits no distension. There is no tenderness. There is no guarding.  Musculoskeletal: Normal range of motion.  Neurological: He is alert and oriented to person, place, and time.  Skin: There is pallor.    Assessment/Plan: UGI bleeding Hypotension Clot in pylorus per Dr Benson Norway endoscopy report On   Eloquis  last dose yesterday by report  D/W Dr Benson Norway.  Attempt at embolization best initial step.  If unsuccessful and pt continues to bleed,  Will need laparotomy.  Pt still somewhat sedated from endoscopy.  Would benefit from CCM care in ICU.  IR coming to see pt per Dr Benson Norway.  Recommend repeating lab work,  CBC q 6 and blood products as needed.  It is encouraging that some clot noted on endoscopy.  On  PPI   and has hx  NSAID  use but apparently in good health otherwise. Available as needed for laparotomy if embolization unsuccessful.   Laramie Gelles A. 06/07/2014, 8:58 PM

## 2014-06-07 NOTE — Progress Notes (Signed)
Pt vomited approximately 50 cc of bright [place] blood in room sink. Pt stated that he had some pain in his lower esophageal region. RN gave pt PRN zofran and paged on call PA. RN was instructed to make pt NPO and hold eliquis. Pt currently resting in chair. Will continue to monitor.   Genevive Bi, RN

## 2014-06-07 NOTE — Progress Notes (Signed)
Pt arrived to unit accompanied by 2 RNs. Pt placed on monitor and oriented to unit. VS stable. No s/s of acute distress noted.

## 2014-06-07 NOTE — Plan of Care (Signed)
Cardiology Fellow Short Note  [phone] admitted with atrial fibrillation with RVR, acute systolic dysfunction, moderate MR and HTN.  He is s/p TEE/DCCV.  He was placed on Tikosyn.  He was also placed on Eliquis.  GI was consulted for hematemesis.  He was planned for EGD in am.  However, patient ahd recurrent hematemesis along with large melanotic stools with clots.  GI was called and urgent EGD was planned at the bedside.  Patient was hypotensive, rapid response was called.  IVF bolus was given followed by Levophed gtt.  EGD by Dr. Elnoria Howard revealed large area in the pylorus with clot and active bleeding.  Given severity, Dr. Elnoria Howard recommended further urgent evaluation by surgery and interventional radiology.  Patient was transferred to ICU with CCM consult, pending further plan.  Addendum. Evaluated by surgery and IR. Plan for embolization by IR Surgery available for laparotomy if embolization unsuccessful. Patient family updated and traveling from [place].

## 2014-06-07 NOTE — Progress Notes (Signed)
Pt continues to have bloody vomit. Dr. Johney Frame came to the unit. Will give another dose of IV Zofran per verbal order from Dr. Johney Frame. Will give phenergan if needed. Will continue to monitor patient.  Ok to hold morning medications.

## 2014-06-07 NOTE — Progress Notes (Signed)
Bryan Moran    CBC  Recent Labs Lab 06/02/14 1038  06/03/14 0520 06/07/14 0842 06/07/14 1340 06/07/14 1838  HGB 16.1  < > 15.6 14.0 13.4 12.0*  HCT 47.0  < > 47.3 42.1 40.5 37.4*  WBC 7.9  --  9.0 9.2  --   --   PLT 225  --  216 195  --   --   < > = values in this interval not displayed.  COAGULATION  Recent Labs Lab 06/02/14 1710  INR 1.32    CARDIAC   Recent Labs Lab 06/02/14 1038  TROPONINI <0.30    Recent Labs Lab 06/02/14 1038  PROBNP 4097.0*     CHEMISTRY  Recent Labs Lab 06/02/14 1710  06/04/14 1925 06/05/14 0500 06/05/14 1641 06/06/14 0552 06/07/14 0500  NA 145  < > 139 143 139 142 143  K 4.2  < > 3.9 3.9 4.3 3.6* 4.3  CL 107  < > 101 104 103 103 107  CO2 Moran  < > 27 27 Moran 26 25   GLUCOSE 95  < > 129* 113* 180* 111* 113*  BUN 29*  < > 25* 24* 23 25* 30*  CREATININE 1.33  < > 1.43* 1.27 1.Moran 1.24 1.12  CALCIUM 8.3*  < > 8.4 8.5 8.3* 8.2* 8.3*  MG 2.2  --   --  2.2 2.0 2.1 2.1  < > = values in this interval not displayed. Estimated Creatinine Clearance: 89.1 ml/min (by C-G formula based on Cr of 1.12).   LIVER  Recent Labs Lab 06/02/14 1710  AST 41*  ALT 113*  ALKPHOS 48  BILITOT 2.3*  PROT 5.5*  ALBUMIN 3.1*  INR 1.32     INFECTIOUS No results found for this basename: LATICACIDVEN, PROCALCITON,  in the last 168 hours   ENDOCRINE CBG (last 3)   Recent Labs  06/07/14 2137  GLUCAP 176*         IMAGING x48h No results found.   A Pyloric Ulcer GI B.leed  P IR intervetnion - d/w Dr Vilma Prader If fails CCS - patient seen by CCS Check abg stat, lactate,  stat chemistry, trop x 1 CBC q4h INR stat PRBC for active bleed or hgb < 7gm% Levophed  IR to place cvl PCCM fellow to see  Dr. Kalman Shan, M.D., [facility].C.P Pulmonary and Critical Care Medicine Staff Physician [facility] Pager: [phone], If no answer or between  15:00h - 7:00h: call [phone]  06/07/2014 9:45 PM

## 2014-06-07 NOTE — Consult Note (Signed)
Chief Complaint:  Acute upper GI bleed with hemodynamic instability  Chief Complaint  Patient presents with  . Atrial Fibrillation  . Fatigue  . Shortness of Breath    Referring Physician(s): Jeani Hawking, MD  History of Present Illness:  Bryan Moran is a 68 y.o. male recently admitted for AFIB with RVR.  He was placed on Eliquis and.  Subsequently, he developed hematemasis and hematochezia.  Eliquis was held with a plan for EGD at 48 hrs.  Due to recurrent large volume hematemesis and hypotension, EGD performed urgently.  Source of bleeding revealed to be pylorus vs. Proximal duodenum.  Large clot with active bleeding upon manipulation.  VIRS consulted for angio and possible embolization.  Surgery also consulted and on-board if embolization is unsuccessful.    Pt currently stable and relatively asymptomatic.  Discussed plan and informed pt of risks, benefits and alternatives.  His sister is with him.  He understands and wishes to proceed.   Past Medical History  Diagnosis Date  . Hypertension   . A-fib     a. Officially dx 05/27/14, sx may have been going on longer.  . Eczema   . Dysrhythmia     Past Surgical History  Procedure Laterality Date  . Hernia repair  1993    [facility]  . Tee without cardioversion N/A 06/05/2014    Procedure: TRANSESOPHAGEAL ECHOCARDIOGRAM (TEE);  Surgeon: Quintella Reichert, MD;  Location: [facility] ENDOSCOPY;  Service: Cardiovascular;  Laterality: N/A;  . Cardioversion N/A 06/05/2014    Procedure: CARDIOVERSION;  Surgeon: Quintella Reichert, MD;  Location: [facility] ENDOSCOPY;  Service: Cardiovascular;  Laterality: N/A;    Allergies: Review of patient's allergies indicates no known allergies.  Medications: Prior to Admission medications   Medication Sig Start Date End Date Taking? Authorizing Provider  Dermatological Products, Misc. (ELETONE EX) Apply 1 application topically 2 (two) times daily as needed (eczema).   Yes Historical Provider, MD    lisinopril-hydrochlorothiazide (PRINZIDE,ZESTORETIC) 20-12.5 MG per tablet Take 1 tablet by mouth daily.   Yes Historical Provider, MD  Naproxen Sodium (ALEVE PO) Take 2 tablets by mouth daily as needed (pain).   Yes Historical Provider, MD    Family History  Problem Relation Age of Onset  . Depression Father   . Heart disease Father     Some sort of heart issue at end of life - possibly CAD  . Multiple sclerosis Mother   . Cancer Mother     Tumor in kidney, spread to bones    History   Social History  . Marital Status: Single    Spouse Name: N/A    Number of Children: N/A  . Years of Education: N/A   Social History Main Topics  . Smoking status: Never Smoker   . Smokeless tobacco: Never Used  . Alcohol Use: No  . Drug Use: No  . Sexual Activity: Not Currently   Other Topics Concern  . None   Social History Narrative  . None    Review of Systems: A 12 point ROS discussed and pertinent positives are indicated in the HPI above.  All other systems are negative.  Review of Systems  Vital Signs: BP 86/54  Pulse 68  Temp(Src) 97.8 F (36.6 C) (Oral)  Resp 18  Ht  (1.905 m)  Wt 253 lb 12 oz (115.1 kg)  BMI 31.72 kg/m2  SpO2 98%  Physical Exam  Constitutional: He is oriented to person, place, and time. He appears well-developed  and well-nourished.  HENT:  Head: Normocephalic.  Eyes: Conjunctivae are normal.  Cardiovascular: Normal rate and regular rhythm.   Pulses:      Femoral pulses are 2+ on the right side. Pulmonary/Chest: Effort normal.  On rebreather  Abdominal: Soft.  Neurological: He is alert and oriented to person, place, and time.  Skin: There is pallor.    Imaging: Dg Chest Portable 1 View  06/02/2014   CLINICAL DATA:  Shortness of breath, fatigue, history of atrial fibrillation.  EXAM: PORTABLE CHEST - 1 VIEW  COMPARISON:  None.  FINDINGS: Markedly enlarged cardiac silhouette and mediastinal contours. Pulmonary venous congestion without  frank evidence of edema. Bibasilar heterogeneous opacities, left greater than right. No definite pleural effusion, though note, the bilateral costophrenic angles are excluded from view. No pneumothorax. Unchanged bones.  IMPRESSION: Marked cardiomegaly and pulmonary venous congestion without frank evidence of edema. Further evaluation with a PA and lateral chest radiograph may be obtained as clinically indicated.   Electronically Signed   By: Simonne Come M.D.   On: 06/02/2014 10:54    Labs: Lab Results  Component Value Date   WBC 9.2 06/07/2014   HCT 37.4* 06/07/2014   MCV 97.7 06/07/2014   PLT 195 06/07/2014   NA 143 06/07/2014   K 4.3 06/07/2014   CL 107 06/07/2014   CO2 25 06/07/2014   GLUCOSE 113* 06/07/2014   BUN 30* 06/07/2014   CREATININE 1.12 06/07/2014   CALCIUM 8.3* 06/07/2014   PROT 5.5* 06/02/2014   ALBUMIN 3.1* 06/02/2014   AST 41* 06/02/2014   ALT 113* 06/02/2014   ALKPHOS 48 06/02/2014   BILITOT 2.3* 06/02/2014   GFRNONAA 67* 06/07/2014   GFRAA 77* 06/07/2014   INR 1.32 06/02/2014    Assessment and Plan:  Acute upper GI bleed with transient hypotension.  Source at pylorus/proximal duodenum and not amenable to endoscopic treatment due to bleeding with manipulation.  Pt also requires CVC for volume, blood and central venous pressure monitoring.     - to IR for CVC placement, mesenteric angiogram and embolization  Thank you for this interesting consult.  I greatly enjoyed meeting Tayvion Dalesandro and look forward to participating in their care.   Signed,  Sterling Big, MD

## 2014-06-07 NOTE — Progress Notes (Signed)
Pt stable. No further vomiting, hematemesis. He is for endoscopy tomorrow. VSS though he appears to be in A-flutter on telemetry (EKG from 13.30 showed NSR). Hgb stable- 13.4.   Corine Shelter PA-C 06/07/2014 3:09 PM

## 2014-06-07 NOTE — Progress Notes (Addendum)
Pt vomited 400cc measured, in addition to unmeasured dark Sylvain/brown emesis. Bowel sounds noted to be hyperactive. Pt's BP down from 122/80 to 100/64. Paged [place] PA, awaiting call back. Lab at bedside to draw H/H.  Called Rapid Response RN, she is coming to evaluate pt. Paged Dr. Marina Goodell, awaiting call back. Received call from Dr. Marina Goodell, instructed to call Dr. Elnoria Howard. Awaiting call back.  Received call from Dr. Elnoria Howard with orders to administer a 250cc bolus of NS and continue to monitor VS while awaiting results of H/H.  Pt had large bloody BM with some blood clots noted. Pt also had another episode of dark [place]/brown emesis. Dr. Elnoria Howard coming in to perform EGD tonight. Continuing to monitor pt closely.   Pt finished 250cc bolus, BP 86/54. Dr. Elnoria Howard called, received verbal order to continue bolus of NS to keep BP up until he arrives.  Rapid response called, on way up to see pt.  Dr. Elnoria Howard at bedside. Paged Dr. Mayford Knife with cardiology. Dr. Mayford Knife at bedside.   Report called to [facility] on 31M. Pt currently undergoing EGD at bedside. Attempted to call and update family multiple times with no answer.

## 2014-06-07 NOTE — Progress Notes (Signed)
Called pts sister, Julieanne Cotton, and let her know that the EGD was going to be done tonight. Updated sister on pts BP and care. She said she was on her way from [place] and that it would still be a while.

## 2014-06-07 NOTE — Progress Notes (Signed)
SUBJECTIVE: The patient has developed hematemesis this am.  Reports mild epigastric pain.  Though there is moderate hematemesis, he remains hemodynamically stable. He had TEE earlier this week which per Dr Mayford Knife was not a traumatic procedure.   At this time, he denies chest pain, shortness of breath, or any new concerns.  Shortness of breath improved.   Echocardiogram has demonstrated EF 15-20%, moderate concentric hypertrophy, diffuse hypokinesis, grade 2 diastolic dysfunction, moderate MR, biatrial enlargement (LA 53).   CURRENT MEDICATIONS: . apixaban  5 mg Oral Q12H  . carvedilol  6.25 mg Oral BID WC  . dofetilide  500 mcg Oral Q12H  . furosemide  40 mg Intravenous Daily  . lisinopril  5 mg Oral Daily  . potassium chloride  40 mEq Oral Daily  . sodium chloride  3 mL Intravenous Q12H  . sodium chloride  3 mL Intravenous Q12H      OBJECTIVE: Physical Exam: Filed Vitals:   06/06/14 2129 06/07/14 0621 06/07/14 0735 06/07/14 0829  BP: 133/87 132/94 115/83 135/85  Pulse: 67 76 70 70  Temp: 97.8 F (36.6 C) 97.4 F (36.3 C)    TempSrc: Oral Oral    Resp: Height:      Weight:  255 lb 12.8 oz (116.03 kg)    SpO2: 92% 94% 94% 97%    Intake/Output Summary (Last 24 hours) at 06/07/14 0858 Last data filed at 06/07/14 0600  Gross per 24 hour  Intake    120 ml  Output    150 ml  Net    -30 ml    Telemetry reveals sinus rhythm, Qtc 500  GEN- The patient is ill appearing, alert and oriented x 3 today.   Head- normocephalic, atraumatic Eyes-  Sclera clear, conjunctiva pink Ears- hearing intact Oropharynx- clear Neck- supple,  Lungs- Clear to ausculation bilaterally, normal work of breathing Heart- regular rate and rhythm with frequent ectopy,  GI- soft, NT, ND, + BS, no rebound/ guarding Extremities- no clubbing, cyanosis  Skin- no rash or lesion Psych- euthymic mood, full affect Neuro- strength and sensation are intact  LABS: Basic Metabolic  Panel:  Recent Labs  06/06/14 0552 06/07/14 0500  NA 142 143  K 3.6* 4.3  CL 103 107  CO2 26 25  GLUCOSE 111* 113*  BUN 25* 30*  CREATININE 1.24 1.12  CALCIUM 8.2* 8.3*  MG 2.1 2.1   RADIOLOGY: Dg Chest Portable 1 View 06/02/2014   CLINICAL DATA:  Shortness of breath, fatigue, history of atrial fibrillation.  EXAM: PORTABLE CHEST - 1 VIEW  COMPARISON:  None.  FINDINGS: Markedly enlarged cardiac silhouette and mediastinal contours. Pulmonary venous congestion without frank evidence of edema. Bibasilar heterogeneous opacities, left greater than right. No definite pleural effusion, though note, the bilateral costophrenic angles are excluded from view. No pneumothorax. Unchanged bones.  IMPRESSION: Marked cardiomegaly and pulmonary venous congestion without frank evidence of edema. Further evaluation with a PA and lateral chest radiograph may be obtained as clinically indicated.   Electronically Signed   By: Simonne Come M.D.   On: 06/02/2014 10:54    ASSESSMENT AND PLAN:  Active Problems:   Persistent atrial fibrillation   Atrial fibrillation  1. Hematemesis/ acute  Upper GI bleed Concerning I have spoken with GI who will see the patient Per their recommendation, will start IV protonix Will hold eliquis for now Stat CBC is pending May require transfer to stepdown if he develops hemodynamic compromise  2. afib Maintaining  sinus rhythm Will have to hold tikosyn until taking POs I am very reluctant to hold eliquis for a prolonged period due to recent cardioversion and increased stroke risk Will hold until GI workup is complete Hold coreg until GI bleeding issues resolve  3. Acute systolic dysfunction Likely due to afib with RVR Will achieve sinus and then titrate medicines for chf once GI bleeding is resolved No real ischemic symptoms--> consider outpatient myoview once in sinus Repeat echo in 3 months Hold lasix in setting of GI bleed Caution with IVF  4. HTN Stable  5.  Moderate MR Follow clinically

## 2014-06-07 NOTE — Sedation Documentation (Signed)
Dr. Archer Asa moving on to Rt groin stick now. Pt tolerating well. VSS. Levophed at 62mcg/min

## 2014-06-07 NOTE — Consult Note (Signed)
[name] Consult: 9:20 AM 06/07/2014  LOS: 5 days    Referring Provider: Dr Tillie Rung  Primary Care Physician:  Lorenda Peck, MD Primary Gastroenterologist:  unassigned     Reason for Consultation:  hematemesis   HPI: Bryan Moran is a 68 y.o. male.  Hx htn, eczema, afib. He works out at gym regularly and runs 4 miles BID, previously without problems.    Admitted 9/6 with increased fatigue, intermittent dizziness. Called EMS and rate in 130s to 140s, LBBB.  Echo showed EF of 15 to 20%, moderate concentric hypertrophy, diffuse hypokinesis, grade 2 diastolic dysfunction, moderate MR, biatrial enlargement (LA 53).  Underwent TEE cardioversion on 9/9 and placed on Tikosyn and Eliquis.  Has maintained NSR since.   Late last night and this AM has had total 4 episodes of hematemesis, blood seen from the start. Feels diaphoretic, cool.  Had some epigastric discomfort which has resolved.  Last BM was brown, formed yesterday and since admission he is more constipated.  Hgb 14 this AM, but started at 16.1 on 9/6.  BUN is up from 25 to 30.  SBPs in 80s to 90s, no tachycardia despite the bleeding.  Takes 2 Aleve every other day.  Had not been taking his ASA PTA as it would cause purpura on his arms.   Never had EGD or colonoscopy.  Not using or needing PPI as has no GI problems.     Past Medical History  Diagnosis Date  . Hypertension   . A-fib     a. Officially dx 05/27/14, sx may have been going on longer.  . Eczema   . Dysrhythmia     Past Surgical History  Procedure Laterality Date  . Hernia repair  1993    [facility]  . Tee without cardioversion N/A 06/05/2014    Procedure: TRANSESOPHAGEAL ECHOCARDIOGRAM (TEE);  Surgeon: Quintella Reichert, MD;  Location: [facility] ENDOSCOPY;  Service: Cardiovascular;   Laterality: N/A;  . Cardioversion N/A 06/05/2014    Procedure: CARDIOVERSION;  Surgeon: Quintella Reichert, MD;  Location: [facility] ENDOSCOPY;  Service: Cardiovascular;  Laterality: N/A;    Prior to Admission medications   Medication Sig Start Date End Date Taking? Authorizing Provider  Dermatological Products, Misc. (ELETONE EX) Apply 1 application topically 2 (two) times daily as needed (eczema).   Yes Historical Provider, MD  lisinopril-hydrochlorothiazide (PRINZIDE,ZESTORETIC) 20-12.5 MG per tablet Take 1 tablet by mouth daily.   Yes Historical Provider, MD  Naproxen Sodium (ALEVE PO) Take 2 tablets by mouth daily as needed (pain).   Yes Historical Provider, MD    Scheduled Meds: . dofetilide  500 mcg Oral Q12H  . pantoprazole (PROTONIX) IV  80 mg Intravenous Once  . [START ON 06/10/2014] pantoprazole (PROTONIX) IV  40 mg Intravenous Q12H  . sodium chloride  3 mL Intravenous Q12H  . sodium chloride  3 mL Intravenous Q12H   Infusions: . pantoprozole (PROTONIX) infusion     PRN Meds: sodium chloride, sodium chloride, acetaminophen, ALPRAZolam, ELETONE, ondansetron (ZOFRAN) IV, sodium chloride, sodium chloride   Allergies  as of 06/02/2014  . (No Known Allergies)    Family History  Problem Relation Age of Onset  . Depression Father   . Heart disease Father     Some sort of heart issue at end of life - possibly CAD  . Multiple sclerosis Mother   . Cancer Mother     Tumor in kidney, spread to bones    History   Social History  . Marital Status: Single    Spouse Name: N/A    Number of Children: N/A  . Years of Education: N/A   Occupational History  . Not on file.   Social History Main Topics  . Smoking status: Never Smoker   . Smokeless tobacco: Never Used  . Alcohol Use: No  . Drug Use: No  . Sexual Activity: Not Currently   Other Topics Concern  . Not on file   Social History Narrative  . No narrative on file    REVIEW OF SYSTEMS: Constitutional:  At home weight is  stable. 4 kg drop since admission ENT:  No nose bleeds Pulm:  SOB improved.  No cough CV:  No palpitations, no LE edema.  GU:  No hematuria, no frequency, no prostate issues GI:  No dysphagia, no hx liver test abnormalities.  Not a drinker.  Heme:  No anemia   Transfusions:  none Neuro:  No headaches, no peripheral tingling or numbness Derm:  No itching, no open sores.  ecczema on forearms. Endocrine:  No sweats or chills.  No polyuria or dysuria Immunization:  Not queried.    PHYSICAL EXAM: Vital signs in last 24 hours: Filed Vitals:   06/07/14 0908  BP: 128/97  Pulse: 78  Temp:   Resp:    Wt Readings from Last 3 Encounters:  06/07/14 116.03 kg (255 lb 12.8 oz)  06/07/14 116.03 kg (255 lb 12.8 oz)  06/07/14 116.03 kg (255 lb 12.8 oz)    General: looks peaked, pale.  Wet cloth across forehead Head:  No swelling or assymetry  Eyes:  No icterus or pallor Ears:  Not HOH.    Nose:  No congestion.  No discharge Mouth:  Clear, moist. Neck:  No mass, no JVD.   Lungs:  Clear bil.  Breathing quietly Heart: RRR.  No MRG Abdomen:  Soft, NT, ND.  Somewhat protuberant. .   Rectal: deferred   Musc/Skeltl: no joint contracture or redness Extremities:  No CCE  Neurologic:  Oriented x 3.  No limb weakness.  No gross deficits Skin:  Pale.  Multiple telangectasias on ankles/feet,  None in upper chest.  Fungal changes in toe nails.  Tattoos:  none Nodes:  No ademopathy in neck   Psych:  Pleasant, cooperative, relaxed.   Intake/Output from previous day: 09/10 0701 - 09/11 0700 In: 120 [P.O.:120] Out: 150 [Emesis/NG output:150] Intake/Output this shift:    LAB RESULTS:  Recent Labs  06/07/14 0842  WBC 9.2  HGB 14.0  HCT 42.1  PLT 195   BMET Lab Results  Component Value Date   NA 143 06/07/2014   NA 142 06/06/2014   NA 139 06/05/2014   K 4.3 06/07/2014   K 3.6* 06/06/2014   K 4.3 06/05/2014   CL 107 06/07/2014   CL 103 06/06/2014   CL 103 06/05/2014   CO2 25 06/07/2014   CO2  26 06/06/2014   CO2 22 06/05/2014   GLUCOSE 113* 06/07/2014   GLUCOSE 111* 06/06/2014   GLUCOSE 180* 06/05/2014   BUN 30*  06/07/2014   BUN 25* 06/06/2014   BUN 23 06/05/2014   CREATININE 1.12 06/07/2014   CREATININE 1.24 06/06/2014   CREATININE 1.22 06/05/2014   CALCIUM 8.3* 06/07/2014   CALCIUM 8.2* 06/06/2014   CALCIUM 8.3* 06/05/2014   LFT No results found for this basename: PROT, ALBUMIN, AST, ALT, ALKPHOS, BILITOT, BILIDIR, IBILI,  in the last 72 hours PT/INR Lab Results  Component Value Date   INR 1.32 06/02/2014   ENDOSCOPIC STUDIES: None ever  IMPRESSION:   *   Hematemesis.  Rule out nsaid induced ulcer, r/o occult liver disease and related portal htn/varices, r/o MWT.    *  Rapid Afib. Maintaining SR after TEE.   MD is stopping Eliquis  *  New diagnoses of CHF, mixed.  EF 15 to 20%.     PLAN:     *  Agree with transferring to SDU, Protonix drip, serial Hgbs.  Eliquis wash out is officially 48 hours.  *   Is now NPO though could probably have ice chips/sips and any essential po meds.    Jennye Moccasin  06/07/2014, 9:20 AM Pager: [phone]  GI ATTENDING  History, laboratories, x-rays reviewed. Patient personally seen and examined. Agree with H&P as outlined above. Patient presented with symptomatic atrial fibrillation with rapid ventricular response. Has been treated with new medications and started on Eliquis. Developed several bouts of hematemesis earlier today. No bowel movements. Stable blood pressure and hemoglobin. Does take NSAIDs at home. Eliquis being held. Exam noncontributory. Plan is to move the patient to step down (already completed), IV PPI, holding Eliquis, transfuse if needed. Plan upper endoscopy tomorrow after Eliquis wash out. Sooner if clinically indicated. Discussed in detail with patient. Dr. Elnoria Howard on call for GI  Wilhemina Bonito. Eda Keys., M.D. [facility] Division of Gastroenterology

## 2014-06-07 NOTE — Op Note (Signed)
[facility] [address] [place], [address]   OPERATIVE PROCEDURE REPORT  PATIENT: Bryan Moran, Bryan Moran  MR#: #[id number] BIRTHDATE: 08/13/2007  GENDER: Male ENDOSCOPIST: Jeani Hawking, MD ASSISTANT:   Beryle Beams, technician and Priscella Mann, technician PROCEDURE DATE: 06/07/2014 PROCEDURE:   EGD w/ control of bleeding ASA CLASS:   Class III INDICATIONS:Hematemesis. MEDICATIONS: Versed 5 mg IV and Fentanyl 25 mcg IV TOPICAL ANESTHETIC:   Cetacaine Spray  DESCRIPTION OF PROCEDURE:   After the risks benefits and alternatives of the procedure were thoroughly explained, informed consent was obtained.  The Pentax Gastroscope [id number]  endoscope was introduced through the mouth  and advanced to the pylorus Without limitations.      The instrument was slowly withdrawn as the mucosa was fully examined.      FINDINGS: Dr. Mayford Knife was called to assist in the patient's hypotension.  Levophed was initiated and titreated during the procedure.  Upon initial entry into the esophagus there was no evidence of any blood, but the Z-line was irregular.  In the gastric lumen approximaly 1 liter of coffee-ground material was suctioned.  In the pyloric region the site of bleeding was clear as there was a large clot in the pyloric channel.  I do not know if the clot is from a pyloric channel ulcer versus a duodenal ulcer. I proceed to shave down the clot with a cold snare, but in the process there was fast oozing of arterial blood.  I then injected 1:10,000 Epi in a four quadrant fashion around the pylorus to help control the bleeding.  I then restarted shaving the clot only to precipitate more bleeding.  At this point I decided to stop the procedure.  I did attempt to pass the endoscope through the pylorus, but the clot precluded my visualization.   Retroflexed views revealed no abnormalities.     The scope was then withdrawn from the patient and the procedure  terminated.  COMPLICATIONS: There were no complications. IMPRESSION: 1) Large pyloric channel versus duodenal bulb clot with active bleeding.  I favor a duodenal source.  RECOMMENDATIONS: 1) Transfer to ICU. 2) CCM consult. 3) Surgical consult. 4) IR consult. 5) Transfuse PRBC as necessary.  _______________________________ Rosalie DoctorJeani Hawking, MD 06/07/2014 9:06 PM

## 2014-06-07 NOTE — Progress Notes (Signed)
Called by RN 6:30 am. Pt vomited small amount ( ? 50cc ) of blood. He is otherwise asymptomatic and VS stable. Will keep NPO, check CBC, and hold Eliquis till evaluated this am.   Corine Shelter PA-C 06/07/2014 6:38 AM

## 2014-06-07 NOTE — Sedation Documentation (Signed)
Levophed switched to blue lumen of central venous cath to rt IJ

## 2014-06-07 NOTE — Significant Event (Signed)
Rapid Response Event Note  Overview: Time Called: 0830 Arrival Time: 0840 Event Type: Other (Comment) (GIB)  Initial Focused Assessment: Patient with multiple episodes of vomiting dark [place] blood this morning. Patient complains of epigastric pain and extreme weakness. BP 128/85  HR 78  RR 20  O2 sat 95% on 2L [place] Heart tones irregular, lung sounds clear. Abdomen slightly distended per patient, non tender.  Hypoactive BS.  Last bm yesterday, firm  Interventions: 2nd IV placed Type and screen drawn and sent to lab PA with GI at bedside to assess patient for additional GI work up Plan transfer to [facility] unit.  Will continue to monitor.  Event Summary: Name of Physician Notified: Dr Johney Frame at bedside at    Name of Consulting Physician Notified: GI Consult, Sigurd Sos PA at bedside to assess patient at 0900  Outcome: Transferred (Comment)     Marcellina Millin

## 2014-06-07 NOTE — Sedation Documentation (Signed)
Titrated Levo to 67mcg/min

## 2014-06-08 DIAGNOSIS — K922 Gastrointestinal hemorrhage, unspecified: Secondary | ICD-10-CM

## 2014-06-08 LAB — BASIC METABOLIC PANEL
Anion gap: 7 (ref 5–15)
BUN: 41 mg/dL — ABNORMAL HIGH (ref 6–23)
CO2: 28 meq/L (ref 19–32)
CREATININE: 1.12 mg/dL (ref 0.50–1.35)
Calcium: 7.5 mg/dL — ABNORMAL LOW (ref 8.4–10.5)
Chloride: 109 mEq/L (ref 96–112)
GFR calc Af Amer: 77 mL/min — ABNORMAL LOW (ref >90)
GFR, EST NON AFRICAN AMERICAN: 67 mL/min — AB (ref >90)
GLUCOSE: 163 mg/dL — AB (ref 70–99)
Potassium: 4.5 mEq/L (ref 3.7–5.3)
Sodium: 144 mEq/L (ref 137–147)

## 2014-06-08 LAB — COMPREHENSIVE METABOLIC PANEL
ALBUMIN: 2.3 g/dL — AB (ref 3.5–5.2)
ALT: 34 U/L (ref 0–53)
ANION GAP: 8 (ref 5–15)
AST: 16 U/L (ref 0–37)
Alkaline Phosphatase: 34 U/L — ABNORMAL LOW (ref 39–117)
BILIRUBIN TOTAL: 1 mg/dL (ref 0.3–1.2)
BUN: 40 mg/dL — ABNORMAL HIGH (ref 6–23)
CHLORIDE: 111 meq/L (ref 96–112)
CO2: 27 mEq/L (ref 19–32)
CREATININE: 1.11 mg/dL (ref 0.50–1.35)
Calcium: 7.7 mg/dL — ABNORMAL LOW (ref 8.4–10.5)
GFR calc Af Amer: 78 mL/min — ABNORMAL LOW (ref >90)
GFR, EST NON AFRICAN AMERICAN: 67 mL/min — AB (ref >90)
Glucose, Bld: 157 mg/dL — ABNORMAL HIGH (ref 70–99)
Potassium: 4.7 mEq/L (ref 3.7–5.3)
Sodium: 146 mEq/L (ref 137–147)
Total Protein: 4.3 g/dL — ABNORMAL LOW (ref 6.0–8.3)

## 2014-06-08 LAB — PROTIME-INR
INR: 1.36 (ref 0.00–1.49)
Prothrombin Time: 16.8 seconds — ABNORMAL HIGH (ref 11.6–15.2)

## 2014-06-08 LAB — CBC
HCT: 28.5 % — ABNORMAL LOW (ref 39.0–52.0)
HEMATOCRIT: 32 % — AB (ref 39.0–52.0)
HEMATOCRIT: 31.5 % — AB (ref 39.0–52.0)
HEMOGLOBIN: 10.6 g/dL — AB (ref 13.0–17.0)
Hemoglobin: 10.6 g/dL — ABNORMAL LOW (ref 13.0–17.0)
Hemoglobin: 9.4 g/dL — ABNORMAL LOW (ref 13.0–17.0)
MCH: 32.4 pg (ref 26.0–34.0)
MCH: 32.3 pg (ref 26.0–34.0)
MCH: 32 pg (ref 26.0–34.0)
MCHC: 33.1 g/dL (ref 30.0–36.0)
MCHC: 33.7 g/dL (ref 30.0–36.0)
MCHC: 33 g/dL (ref 30.0–36.0)
MCV: 97.9 fL (ref 78.0–100.0)
MCV: 96 fL (ref 78.0–100.0)
MCV: 96.9 fL (ref 78.0–100.0)
PLATELETS: 185 10*3/uL (ref 150–400)
Platelets: 169 10*3/uL (ref 150–400)
Platelets: 153 10*3/uL (ref 150–400)
RBC: 3.27 MIL/uL — AB (ref 4.22–5.81)
RBC: 3.28 MIL/uL — ABNORMAL LOW (ref 4.22–5.81)
RBC: 2.94 MIL/uL — ABNORMAL LOW (ref 4.22–5.81)
RDW: 14.5 % (ref 11.5–15.5)
RDW: 14.6 % (ref 11.5–15.5)
RDW: 14.6 % (ref 11.5–15.5)
WBC: 12.8 10*3/uL — ABNORMAL HIGH (ref 4.0–10.5)
WBC: 13.4 10*3/uL — ABNORMAL HIGH (ref 4.0–10.5)
WBC: 11.1 10*3/uL — ABNORMAL HIGH (ref 4.0–10.5)

## 2014-06-08 LAB — CBC WITH DIFFERENTIAL/PLATELET
Basophils Absolute: 0 10*3/uL (ref 0.0–0.1)
Basophils Relative: 0 % (ref 0–1)
Eosinophils Absolute: 0.1 10*3/uL (ref 0.0–0.7)
Eosinophils Relative: 1 % (ref 0–5)
HCT: 27.7 % — ABNORMAL LOW (ref 39.0–52.0)
Hemoglobin: 9.1 g/dL — ABNORMAL LOW (ref 13.0–17.0)
LYMPHS PCT: 10 % — AB (ref 12–46)
Lymphs Abs: 1 10*3/uL (ref 0.7–4.0)
MCH: 31.9 pg (ref 26.0–34.0)
MCHC: 32.9 g/dL (ref 30.0–36.0)
MCV: 97.2 fL (ref 78.0–100.0)
Monocytes Absolute: 0.9 10*3/uL (ref 0.1–1.0)
Monocytes Relative: 9 % (ref 3–12)
Neutro Abs: 8 10*3/uL — ABNORMAL HIGH (ref 1.7–7.7)
Neutrophils Relative %: 80 % — ABNORMAL HIGH (ref 43–77)
PLATELETS: 145 10*3/uL — AB (ref 150–400)
RBC: 2.85 MIL/uL — AB (ref 4.22–5.81)
RDW: 14.6 % (ref 11.5–15.5)
WBC: 10 10*3/uL (ref 4.0–10.5)

## 2014-06-08 LAB — GLUCOSE, CAPILLARY
Glucose-Capillary: 179 mg/dL — ABNORMAL HIGH (ref 70–99)
Glucose-Capillary: 137 mg/dL — ABNORMAL HIGH (ref 70–99)
Glucose-Capillary: 127 mg/dL — ABNORMAL HIGH (ref 70–99)
Glucose-Capillary: 120 mg/dL — ABNORMAL HIGH (ref 70–99)
Glucose-Capillary: 99 mg/dL (ref 70–99)
Glucose-Capillary: 89 mg/dL (ref 70–99)

## 2014-06-08 LAB — LACTIC ACID, PLASMA: Lactic Acid, Venous: 1.2 mmol/L (ref 0.5–2.2)

## 2014-06-08 LAB — HEMOGLOBIN A1C
HEMOGLOBIN A1C: 6.2 % — AB (ref <5.7)
MEAN PLASMA GLUCOSE: 131 mg/dL — AB (ref <117)

## 2014-06-08 LAB — PREPARE RBC (CROSSMATCH)

## 2014-06-08 LAB — PHOSPHORUS: Phosphorus: 3.5 mg/dL (ref 2.3–4.6)

## 2014-06-08 LAB — MAGNESIUM: Magnesium: 2.1 mg/dL (ref 1.5–2.5)

## 2014-06-08 LAB — TROPONIN I: Troponin I: <0.3 ng/mL (ref <0.30)

## 2014-06-08 MED ORDER — INSULIN ASPART 100 UNIT/ML ~~LOC~~ SOLN
1.0000 [IU] | SUBCUTANEOUS | Status: DC
  Administered 2014-06-08: 1 [IU] via SUBCUTANEOUS
  Administered 2014-06-08: 2 [IU] via SUBCUTANEOUS
  Administered 2014-06-08: 1 [IU] via SUBCUTANEOUS

## 2014-06-08 NOTE — Sedation Documentation (Signed)
Levo down to 11 mcg/min. Tolerating well .

## 2014-06-08 NOTE — Sedation Documentation (Signed)
Sheath pulled by Dr. Archer Asa who is now holding manual pressure.  Exoseal placed.

## 2014-06-08 NOTE — Progress Notes (Signed)
PULMONARY / CRITICAL CARE MEDICINE HISTORY AND PHYSICAL EXAMINATION   Name: Bryan Moran MRN: [id number] DOB: Jun 08, 1946    ADMISSION DATE:  06/02/2014  PRIMARY SERVICE: PCCM  CHIEF COMPLAINT:  Hematemasis  BRIEF PATIENT DESCRIPTION: 68 M with recently-discovered Afib initially admitted with Afib with RVR. Eloquis was started as an inpatient and he underwent DC cardioversion following a TEE on 9/9. On the morning of 9/11 he developed hematemasis with high-risk findings on endoscopy. He was transferred to [facility] for post-procedure care.  SIGNIFICANT EVENTS / STUDIES:  Eloquis Started 9/6 TEE/DCCV 9/9 Hematemasis 9/11 EGD 9/11 with pyloric channel vs. Duodenal bulb clot with active bleeding Surg Consult 9/11 for possible emergent laparatomy VIR embolization of GDA and right pyloric artery  LINES / TUBES: RIJ 9/11 3 PIVs  CULTURES: None  ANTIBIOTICS: None  HISTORY OF PRESENT ILLNESS:  Bryan Moran is a 68 yo M with recently-discovered afib who was admitted to [facility] on 9/6 for "not feeling well" subsequently found to be in afib with RVR. He was managed with rate control and started on Eloquis on arrival. He underwent TEE and DCCV on 9/9 which was uneventful but unsuccessful. On 9/11 he had a small volume episode of hematemasis. He was taken to EGD urgently after he had a large volume hematemasis and melantoic stool. During the EGD he was found to have active bleeding and high-risk findings and was thus taken to VIR for an embolization. Currently, he is feeling "fine" save for being a little tired. He denies chest or abdominal pain.  PAST MEDICAL HISTORY :  Past Medical History  Diagnosis Date  . Hypertension   . A-fib     a. Officially dx 05/27/14, sx may have been going on longer.  . Eczema   . Dysrhythmia    Past Surgical History  Procedure Laterality Date  . Hernia repair  1993    [facility]  . Tee without cardioversion N/A 06/05/2014    Procedure: TRANSESOPHAGEAL  ECHOCARDIOGRAM (TEE);  Surgeon: Quintella Reichert, MD;  Location: [facility] ENDOSCOPY;  Service: Cardiovascular;  Laterality: N/A;  . Cardioversion N/A 06/05/2014    Procedure: CARDIOVERSION;  Surgeon: Quintella Reichert, MD;  Location: [facility] ENDOSCOPY;  Service: Cardiovascular;  Laterality: N/A;   Prior to Admission medications   Medication Sig Start Date End Date Taking? Authorizing Provider  Dermatological Products, Misc. (ELETONE EX) Apply 1 application topically 2 (two) times daily as needed (eczema).   Yes Historical Provider, MD  lisinopril-hydrochlorothiazide (PRINZIDE,ZESTORETIC) 20-12.5 MG per tablet Take 1 tablet by mouth daily.   Yes Historical Provider, MD  Naproxen Sodium (ALEVE PO) Take 2 tablets by mouth daily as needed (pain).   Yes Historical Provider, MD   No Known Allergies  FAMILY HISTORY:  Family History  Problem Relation Age of Onset  . Depression Father   . Heart disease Father     Some sort of heart issue at end of life - possibly CAD  . Multiple sclerosis Mother   . Cancer Mother     Tumor in kidney, spread to bones   SOCIAL HISTORY:  reports that he has never smoked. He has never used smokeless tobacco. He reports that he does not drink alcohol or use illicit drugs.  REVIEW OF SYSTEMS:  A 12-system ROS was conducted and, unless otherwise specified in the HPI, was negative.    SUBJECTIVE:   VITAL SIGNS: Temp:  [97.4 F (36.3 C)-98.1 F (36.7 C)] 97.8 F (36.6 C) (09/11 2139) Pulse Rate:  [  31-98] 59 (09/12 0015) Resp:  [12-31] 22 (09/12 0015) BP: (86-151)/(54-97) 116/58 mmHg (09/12 0015) SpO2:  [88 %-100 %] 97 % (09/12 0015) FiO2 (%):  [55 %] 55 % (09/11 2130) Weight:  [253 lb 12 oz (115.1 kg)-255 lb 12.8 oz (116.03 kg)] 253 lb 12 oz (115.1 kg) (09/11 2200) HEMODYNAMICS:   VENTILATOR SETTINGS: Vent Mode:  [-]  FiO2 (%):  [55 %] 55 % INTAKE / OUTPUT: Intake/Output     09/11 0701 - 09/12 0700   Urine (mL/kg/hr) 150 (0.1)   Emesis/NG output 725 (0.3)   Total  Output 875   Net -875       Stool Occurrence 1 x   Emesis Occurrence 2 x     PHYSICAL EXAMINATION: General:  Elderly M in NAD Neuro:  Intact HEENT:  Sclera anicteric, conjunctiva pink, MMM, OP clear Neck: Trachea supple and midline, (-) LAN Cardiovascular:  Irreg RR, NS1/S2, (-) MRG Lungs:  CTAB Abdomen:  S/NT/ND/(+)BS Musculoskeletal:  (-) C/C/E Skin:  Vericose veins  LABS:  CBC  Recent Labs Lab 06/03/14 0520 06/07/14 0842 06/07/14 1340 06/07/14 1838 06/07/14 2210  WBC 9.0 9.2  --   --  12.2*  HGB 15.6 14.0 13.4 12.0* 11.5*  HCT 47.3 42.1 40.5 37.4* 34.4*  PLT 216 195  --   --  194   Coag's  Recent Labs Lab 06/02/14 1710 06/07/14 2210  INR 1.32 1.40   BMET  Recent Labs Lab 06/06/14 0552 06/07/14 0500 06/07/14 2210  NA 142 143 143  K 3.6* 4.3 4.7  CL 103 107 109  CO2 26 25 23   BUN 25* 30* 38*  CREATININE 1.24 1.12 1.09  GLUCOSE 111* 113* 184*   Electrolytes  Recent Labs Lab 06/05/14 1641 06/06/14 0552 06/07/14 0500 06/07/14 2210  CALCIUM 8.3* 8.2* 8.3* 7.7*  MG 2.0 2.1 2.1  --    Sepsis Markers  Recent Labs Lab 06/07/14 2210  LATICACIDVEN 1.5   ABG  Recent Labs Lab 06/07/14 2159  PHART 7.416  PCO2ART 40.2  PO2ART 78.0*   Liver Enzymes  Recent Labs Lab 06/02/14 1710  AST 41*  ALT 113*  ALKPHOS 48  BILITOT 2.3*  ALBUMIN 3.1*   Cardiac Enzymes  Recent Labs Lab 06/02/14 1038 06/07/14 2210  TROPONINI <0.30 <0.30  PROBNP 4097.0*  --    Glucose  Recent Labs Lab 06/07/14 2137  GLUCAP 176*    Imaging No results found.  ASSESSMENT / PLAN:  Active Problems:   Persistent atrial fibrillation   Atrial fibrillation   Hematemesis   Coagulopathy   PULMONARY A: No acute issues: Currently on minimal nasal O2 which can be DCed.  CARDIOVASCULAR A: Acute systolic HF: Per cardiology likely 2/2 afib. No evidence of fluid overload on exam currently. Afib: Currently in Afib. Holding AC and rate control agents for  now. HTN:  Holding antihypertensives. P:   Follow-up cards recs  RENAL A: Likely AKI: Elevated BUN without elevation of Cr yet likely 2/2 prerenal AKI in setting of GIB. P:   Serial BMPs  GASTROINTESTINAL A: UGIB: S/p embolization after GI unable to adequately visualize the duodenum. HD stable. P:   Serial CBCs Maintain adequate access Stay 2 units ahead of patient Maintain active T+S Cont Protonix Drip  HEMATOLOGIC A: Acute blood loss anemia:  P:   Maintain HgB > 7  INFECTIOUS A: No acute issues  ENDOCRINE A: Hypergylcemia:  P:   SSI Check A1c  NEUROLOGIC A: No acute issue  BEST PRACTICE /  DISPOSITION Level of Care:  ICU Primary Service:  PCCM Consultants:  Cards, GI Code Status:  Full Diet:  NPO, Sips DVT Px:  SCDs GI Px:  On protonix drip Skin Integrity:  Intact Social / Family:  Need to be updated  TODAY'S SUMMARY:   I have personally obtained a history, examined the patient, evaluated laboratory and imaging results, formulated the assessment and plan and placed orders.  CRITICAL CARE: The patient is critically ill with multiple organ systems failure and requires high complexity decision making for assessment and support, frequent evaluation and titration of therapies, application of advanced monitoring technologies and extensive interpretation of multiple databases. Critical Care Time devoted to patient care services described in this note is 45 minutes.   Evalyn Casco, MD Pulmonary and Critical Care Medicine [facility] Pager: [phone]   06/08/2014, 12:47 AM

## 2014-06-08 NOTE — Progress Notes (Signed)
Subjective: Nursing reported one episode of hematemesis and hematochezia last evening, but the blood was not as [place].  Objective: Vital signs in last 24 hours: Temp:  [97.8 F (36.6 C)-98.7 F (37.1 C)] 98.1 F (36.7 C) (09/12 0747) Pulse Rate:  [31-102] 60 (09/12 0630) Resp:  [11-31] 20 (09/12 0630) BP: (86-152)/(51-137) 99/52 mmHg (09/12 0630) SpO2:  [88 %-100 %] 98 % (09/12 0630) FiO2 (%):  [55 %] 55 % (09/11 2130) Weight:  [115.1 kg (253 lb 12 oz)-116.1 kg (255 lb 15.3 oz)] 116.1 kg (255 lb 15.3 oz) (09/12 0500) Last BM Date: 06/07/14  Intake/Output from previous day: 09/11 0701 - 09/12 0700 In: 757 [I.V.:757] Out: 1275 [Urine:550; Emesis/NG output:725] Intake/Output this shift:    General appearance: sleepy GI: soft, non-tender; bowel sounds normal; no masses,  no organomegaly  Lab Results:  Recent Labs  06/07/14 2210 06/08/14 0128 06/08/14 0604  WBC 12.2* 12.8* 13.4*  HGB 11.5* 10.6* 10.6*  HCT 34.4* 32.0* 31.5*  PLT 194 169 185   BMET  Recent Labs  06/07/14 0500 06/07/14 2210 06/08/14 0210  NA 143 143 144  K 4.3 4.7 4.5  CL 107 109 109  CO2 25 23 28   GLUCOSE 113* 184* 163*  BUN 30* 38* 41*  CREATININE 1.12 1.09 1.12  CALCIUM 8.3* 7.7* 7.5*   LFT No results found for this basename: PROT, ALBUMIN, AST, ALT, ALKPHOS, BILITOT, BILIDIR, IBILI,  in the last 72 hours PT/INR  Recent Labs  06/07/14 2210 06/08/14 0801  LABPROT 17.2* 16.8*  INR 1.40 1.36   Hepatitis Panel No results found for this basename: HEPBSAG, HCVAB, HEPAIGM, HEPBIGM,  in the last 72 hours C-Diff No results found for this basename: CDIFFTOX,  in the last 72 hours Fecal Lactopherrin No results found for this basename: FECLLACTOFRN,  in the last 72 hours  Studies/Results: No results found.  Medications:  Scheduled: . atropine      . ceFAZolin      . dofetilide  500 mcg Oral Q12H  . fentaNYL      . insulin aspart  1-3 Units Subcutaneous 6 times per day  . lidocaine       . midazolam      . [START ON 06/10/2014] pantoprazole (PROTONIX) IV  40 mg Intravenous Q12H   Continuous: . norepinephrine (LEVOPHED) Adult infusion 4 mcg/min (06/08/14 8756)  . pantoprozole (PROTONIX) infusion 8 mg/hr (06/08/14 0801)    Assessment/Plan: 1) Probable duodenal bulb ulcer bleed - Severe. 2) Afib.   I am appreciative of Dr. Henri Medal intervention.  The patient remained hemodynamically stable overnight and his levophed was weaned down.  His HGB did trend down to the 10 range.    Plan: 1) Follow HGB and transfuse as necessary. 2) Check H. Pylori serology and treat if positive. 3) Continue with Protonix. 4) He will require an EGD in the near future to clearly define the source of bleeding.   LOS: 6 days   Diara Chaudhari D 06/08/2014, 9:10 AM

## 2014-06-08 NOTE — Sedation Documentation (Signed)
Oxygen decreased to 4LNC and Levo titrated to 28mcg/min. Pt tolerating well. O2 98-99%

## 2014-06-08 NOTE — Progress Notes (Signed)
Subjective: Patient is s/p IR coil embo for GIB 06/07/14. He denies any abdominal pain, denies any vomiting episodes, RN states 1 dark BM today. He denies any right groin site pain/bleeding.   Allergies: Review of patient's allergies indicates no known allergies.  Medications: Prior to Admission medications   Medication Sig Start Date End Date Taking? Authorizing Provider  Dermatological Products, Misc. (ELETONE EX) Apply 1 application topically 2 (two) times daily as needed (eczema).   Yes Historical Provider, MD  lisinopril-hydrochlorothiazide (PRINZIDE,ZESTORETIC) 20-12.5 MG per tablet Take 1 tablet by mouth daily.   Yes Historical Provider, MD  Naproxen Sodium (ALEVE PO) Take 2 tablets by mouth daily as needed (pain).   Yes Historical Provider, MD    Review of Systems  Gastrointestinal: Positive for blood in stool. Negative for vomiting, abdominal pain and abdominal distention.       1 dark BM   Vital Signs: BP 99/49  Pulse 77  Temp(Src) 98.3 F (36.8 C) (Oral)  Resp 17  Ht _0  (1.905 m)  Wt 255 lb 15.3 oz (116.1 kg)  BMI 31.99 kg/m2  SpO2 100%  Physical Exam  Cardiovascular: Normal rate, regular rhythm and intact distal pulses.  Exam reveals no gallop and no friction rub.   No murmur heard. RCFA access site C/D/I, soft, NT, no signs of bleeding/hematoma  Pulmonary/Chest: Effort normal and breath sounds normal. No respiratory distress. He has no wheezes. He has no rales.  Abdominal: Soft. Bowel sounds are normal. He exhibits no distension. There is no tenderness.   Imaging: No results found.  Labs: Results for orders placed during the hospital encounter of 06/02/14 (from the past 48 hour(s))  MAGNESIUM     Status: None   Collection Time    06/07/14  5:00 AM      Result Value Ref Range   Magnesium 2.1  1.5 - 2.5 mg/dL  BASIC METABOLIC PANEL     Status: Abnormal   Collection Time    06/07/14  5:00 AM      Result Value Ref Range   Sodium 143  137 - 147 mEq/L   Potassium 4.3  3.7 - 5.3 mEq/L   Chloride 107  96 - 112 mEq/L   CO2 25  19 - 32 mEq/L   Glucose, Bld 113 (*) 70 - 99 mg/dL   BUN 30 (*) 6 - 23 mg/dL   Creatinine, Ser 1.12  0.50 - 1.35 mg/dL   Calcium 8.3 (*) 8.4 - 10.5 mg/dL   GFR calc non Af Amer 67 (*) >90 mL/min   GFR calc Af Amer 77 (*) >90 mL/min   Comment: (NOTE)     The eGFR has been calculated using the CKD EPI equation.     This calculation has not been validated in all clinical situations.     eGFR's persistently <90 mL/min signify possible Chronic Kidney     Disease.   Anion gap 11  5 - 15  CBC     Status: None   Collection Time    06/07/14  8:42 AM      Result Value Ref Range   WBC 9.2  4.0 - 10.5 K/uL   RBC 4.31  4.22 - 5.81 MIL/uL   Hemoglobin 14.0  13.0 - 17.0 g/dL   HCT 42.1  39.0 - 52.0 %   MCV 97.7  78.0 - 100.0 fL   MCH 32.5  26.0 - 34.0 pg   MCHC 33.3  30.0 - 36.0 g/dL  RDW 14.7  11.5 - 15.5 %   Platelets 195  150 - 400 K/uL  TYPE AND SCREEN     Status: None   Collection Time    06/07/14  9:30 AM      Result Value Ref Range   ABO/RH(D) O POS     Antibody Screen NEG     Sample Expiration 06/10/2014     Unit Number [id number]     Blood Component Type RBC LR PHER1     Unit division 00     Status of Unit ALLOCATED     Transfusion Status OK TO TRANSFUSE     Crossmatch Result Compatible     Unit Number [id number]     Blood Component Type RBC LR PHER2     Unit division 00     Status of Unit ALLOCATED     Transfusion Status OK TO TRANSFUSE     Crossmatch Result Compatible    ABO/RH     Status: None   Collection Time    06/07/14  9:30 AM      Result Value Ref Range   ABO/RH(D) O POS    MRSA PCR SCREENING     Status: None   Collection Time    06/07/14  1:03 PM      Result Value Ref Range   MRSA by PCR NEGATIVE  NEGATIVE   Comment:            The GeneXpert MRSA Assay (FDA     approved for NASAL specimens     only), is one component of a     comprehensive MRSA colonization     surveillance  program. It is not     intended to diagnose MRSA     infection nor to guide or     monitor treatment for     MRSA infections.  HEMOGLOBIN AND HEMATOCRIT, BLOOD     Status: None   Collection Time    06/07/14  1:40 PM      Result Value Ref Range   Hemoglobin 13.4  13.0 - 17.0 g/dL   HCT 40.5  39.0 - 52.0 %  HEMOGLOBIN AND HEMATOCRIT, BLOOD     Status: Abnormal   Collection Time    06/07/14  6:38 PM      Result Value Ref Range   Hemoglobin 12.0 (*) 13.0 - 17.0 g/dL   HCT 37.4 (*) 39.0 - 52.0 %  PREPARE RBC (CROSSMATCH)     Status: None   Collection Time    06/07/14  8:00 PM      Result Value Ref Range   Order Confirmation ORDER PROCESSED BY BLOOD BANK    GLUCOSE, CAPILLARY     Status: Abnormal   Collection Time    06/07/14  9:37 PM      Result Value Ref Range   Glucose-Capillary 176 (*) 70 - 99 mg/dL  POCT I-STAT 3, ART BLOOD GAS (G3+)     Status: Abnormal   Collection Time    06/07/14  9:59 PM      Result Value Ref Range   pH, Arterial 7.416  7.350 - 7.450   pCO2 arterial 40.2  35.0 - 45.0 mmHg   pO2, Arterial 78.0 (*) 80.0 - 100.0 mmHg   Bicarbonate 26.0 (*) 20.0 - 24.0 mEq/L   TCO2 27  0 - 100 mmol/L   O2 Saturation 96.0     Acid-Base Excess 1.0  0.0 - 2.0 mmol/L   Patient temperature 97.8  F     Collection site RADIAL, ALLEN'S TEST ACCEPTABLE     Drawn by Operator     Sample type ARTERIAL    TROPONIN I     Status: None   Collection Time    06/07/14 10:10 PM      Result Value Ref Range   Troponin I <0.30  <0.30 ng/mL   Comment:            Due to the release kinetics of cTnI,     a negative result within the first hours     of the onset of symptoms does not rule out     myocardial infarction with certainty.     If myocardial infarction is still suspected,     repeat the test at appropriate intervals.  LACTIC ACID, PLASMA     Status: None   Collection Time    06/07/14 10:10 PM      Result Value Ref Range   Lactic Acid, Venous 1.5  0.5 - 2.2 mmol/L  CBC      Status: Abnormal   Collection Time    06/07/14 10:10 PM      Result Value Ref Range   WBC 12.2 (*) 4.0 - 10.5 K/uL   RBC 3.57 (*) 4.22 - 5.81 MIL/uL   Hemoglobin 11.5 (*) 13.0 - 17.0 g/dL   HCT 34.4 (*) 39.0 - 52.0 %   MCV 96.4  78.0 - 100.0 fL   MCH 32.2  26.0 - 34.0 pg   MCHC 33.4  30.0 - 36.0 g/dL   RDW 14.4  11.5 - 15.5 %   Platelets 194  150 - 400 K/uL  BASIC METABOLIC PANEL     Status: Abnormal   Collection Time    06/07/14 10:10 PM      Result Value Ref Range   Sodium 143  137 - 147 mEq/L   Potassium 4.7  3.7 - 5.3 mEq/L   Chloride 109  96 - 112 mEq/L   CO2 23  19 - 32 mEq/L   Glucose, Bld 184 (*) 70 - 99 mg/dL   BUN 38 (*) 6 - 23 mg/dL   Creatinine, Ser 1.09  0.50 - 1.35 mg/dL   Calcium 7.7 (*) 8.4 - 10.5 mg/dL   GFR calc non Af Amer 69 (*) >90 mL/min   GFR calc Af Amer 80 (*) >90 mL/min   Comment: (NOTE)     The eGFR has been calculated using the CKD EPI equation.     This calculation has not been validated in all clinical situations.     eGFR's persistently <90 mL/min signify possible Chronic Kidney     Disease.   Anion gap 11  5 - 15  PROTIME-INR     Status: Abnormal   Collection Time    06/07/14 10:10 PM      Result Value Ref Range   Prothrombin Time 17.2 (*) 11.6 - 15.2 seconds   INR 1.40  0.00 - 1.49  GLUCOSE, CAPILLARY     Status: Abnormal   Collection Time    06/08/14  1:07 AM      Result Value Ref Range   Glucose-Capillary 179 (*) 70 - 99 mg/dL  CBC     Status: Abnormal   Collection Time    06/08/14  1:28 AM      Result Value Ref Range   WBC 12.8 (*) 4.0 - 10.5 K/uL   RBC 3.27 (*) 4.22 - 5.81 MIL/uL   Hemoglobin 10.6 (*)  13.0 - 17.0 g/dL   HCT 32.0 (*) 39.0 - 52.0 %   MCV 97.9  78.0 - 100.0 fL   MCH 32.4  26.0 - 34.0 pg   MCHC 33.1  30.0 - 36.0 g/dL   RDW 14.5  11.5 - 15.5 %   Platelets 169  150 - 400 K/uL  PREPARE RBC (CROSSMATCH)     Status: None   Collection Time    06/08/14  2:00 AM      Result Value Ref Range   Order Confirmation  ORDER PROCESSED BY BLOOD BANK    BASIC METABOLIC PANEL     Status: Abnormal   Collection Time    06/08/14  2:10 AM      Result Value Ref Range   Sodium 144  137 - 147 mEq/L   Potassium 4.5  3.7 - 5.3 mEq/L   Chloride 109  96 - 112 mEq/L   CO2 28  19 - 32 mEq/L   Glucose, Bld 163 (*) 70 - 99 mg/dL   BUN 41 (*) 6 - 23 mg/dL   Creatinine, Ser 1.12  0.50 - 1.35 mg/dL   Calcium 7.5 (*) 8.4 - 10.5 mg/dL   GFR calc non Af Amer 67 (*) >90 mL/min   GFR calc Af Amer 77 (*) >90 mL/min   Comment: (NOTE)     The eGFR has been calculated using the CKD EPI equation.     This calculation has not been validated in all clinical situations.     eGFR's persistently <90 mL/min signify possible Chronic Kidney     Disease.   Anion gap 7  5 - 15  MAGNESIUM     Status: None   Collection Time    06/08/14  2:10 AM      Result Value Ref Range   Magnesium 2.1  1.5 - 2.5 mg/dL  LACTIC ACID, PLASMA     Status: None   Collection Time    06/08/14  2:10 AM      Result Value Ref Range   Lactic Acid, Venous 1.2  0.5 - 2.2 mmol/L  GLUCOSE, CAPILLARY     Status: Abnormal   Collection Time    06/08/14  4:35 AM      Result Value Ref Range   Glucose-Capillary 137 (*) 70 - 99 mg/dL   Comment 1 Documented in Chart     Comment 2 Notify RN    TROPONIN I     Status: None   Collection Time    06/08/14  5:43 AM      Result Value Ref Range   Troponin I <0.30  <0.30 ng/mL   Comment:            Due to the release kinetics of cTnI,     a negative result within the first hours     of the onset of symptoms does not rule out     myocardial infarction with certainty.     If myocardial infarction is still suspected,     repeat the test at appropriate intervals.  CBC     Status: Abnormal   Collection Time    06/08/14  6:04 AM      Result Value Ref Range   WBC 13.4 (*) 4.0 - 10.5 K/uL   RBC 3.28 (*) 4.22 - 5.81 MIL/uL   Hemoglobin 10.6 (*) 13.0 - 17.0 g/dL   HCT 31.5 (*) 39.0 - 52.0 %   MCV 96.0  78.0 - 100.0 fL  MCH 32.3  26.0 - 34.0 pg   MCHC 33.7  30.0 - 36.0 g/dL   RDW 14.6  11.5 - 15.5 %   Platelets 185  150 - 400 K/uL  GLUCOSE, CAPILLARY     Status: Abnormal   Collection Time    06/08/14  7:23 AM      Result Value Ref Range   Glucose-Capillary 127 (*) 70 - 99 mg/dL  COMPREHENSIVE METABOLIC PANEL     Status: Abnormal   Collection Time    06/08/14  8:01 AM      Result Value Ref Range   Sodium 146  137 - 147 mEq/L   Potassium 4.7  3.7 - 5.3 mEq/L   Chloride 111  96 - 112 mEq/L   CO2 27  19 - 32 mEq/L   Glucose, Bld 157 (*) 70 - 99 mg/dL   BUN 40 (*) 6 - 23 mg/dL   Creatinine, Ser 1.11  0.50 - 1.35 mg/dL   Calcium 7.7 (*) 8.4 - 10.5 mg/dL   Total Protein 4.3 (*) 6.0 - 8.3 g/dL   Albumin 2.3 (*) 3.5 - 5.2 g/dL   AST 16  0 - 37 U/L   ALT 34  0 - 53 U/L   Alkaline Phosphatase 34 (*) 39 - 117 U/L   Total Bilirubin 1.0  0.3 - 1.2 mg/dL   GFR calc non Af Amer 67 (*) >90 mL/min   GFR calc Af Amer 78 (*) >90 mL/min   Comment: (NOTE)     The eGFR has been calculated using the CKD EPI equation.     This calculation has not been validated in all clinical situations.     eGFR's persistently <90 mL/min signify possible Chronic Kidney     Disease.   Anion gap 8  5 - 15  PHOSPHORUS     Status: None   Collection Time    06/08/14  8:01 AM      Result Value Ref Range   Phosphorus 3.5  2.3 - 4.6 mg/dL  PROTIME-INR     Status: Abnormal   Collection Time    06/08/14  8:01 AM      Result Value Ref Range   Prothrombin Time 16.8 (*) 11.6 - 15.2 seconds   INR 1.36  0.00 - 1.49  GLUCOSE, CAPILLARY     Status: Abnormal   Collection Time    06/08/14 11:36 AM      Result Value Ref Range   Glucose-Capillary 120 (*) 70 - 99 mg/dL    Assessment and Plan: GI bleed, probable duodenal bulb ulcer S/p Mesenteric angiogram with successful coil embolization GDA and right gastric (pyloric) artery on 06/07/14 in IR H/H stable, BP stable with decrease in Levophed, no episodes of bright [place] bleeding.  If large  volume bleeding recurs, pt may require surgical intervention.  Tsosie Billing PA-C Interventional Radiology  06/08/14  12:46 PM   I spent a total of 15 minutes face to face in clinical consultation/evaluation, greater than 50% of which was counseling/coordinating care

## 2014-06-08 NOTE — Sedation Documentation (Signed)
Pt awake breathing well stating face mask is bothering his face.  Changed to nasal cannula w/ O2 5L

## 2014-06-08 NOTE — Progress Notes (Signed)
SUBJECTIVE: The patient states he is tired this morning.  Denies chest pain, slight shortness of breath.  Vomited once this morning.   06-07-14, developed hematemesis and underwent urgent EGD 9-11 pm which demonstrated pyloric channel vs duodenal bulb clot with active bleeding --> underwent embolization of GDA and right pyloric artery.   CURRENT MEDICATIONS: . atropine      . ceFAZolin      . dofetilide  500 mcg Oral Q12H  . fentaNYL      . insulin aspart  1-3 Units Subcutaneous 6 times per day  . lidocaine      . midazolam      . [START ON 06/10/2014] pantoprazole (PROTONIX) IV  40 mg Intravenous Q12H   . norepinephrine (LEVOPHED) Adult infusion 4 mcg/min (06/08/14 6203)  . pantoprozole (PROTONIX) infusion 8 mg/hr (06/07/14 1233)    OBJECTIVE: Physical Exam: Filed Vitals:   06/08/14 0545 06/08/14 0600 06/08/14 0615 06/08/14 0630  BP: 146/102 139/89 140/59 99/52  Pulse: 76 45 45 60  Temp:      TempSrc:      Resp: 20 23 11 20   Height:      Weight:      SpO2: 95% 95% 97% 98%    Intake/Output Summary (Last 24 hours) at 06/08/14 0708 Last data filed at 06/08/14 5597  Gross per 24 hour  Intake 757.01 ml  Output   1275 ml  Net -517.99 ml    Telemetry reveals atrial fibrillation with intermittent SR  GEN- The patient is well appearing, alert and oriented x 3 today.   Head- normocephalic, atraumatic Eyes-  Sclera clear, conjunctiva pink Ears- hearing intact Oropharynx- clear Neck- supple, no JVP Lymph- no cervical lymphadenopathy Lungs- Clear to ausculation bilaterally, normal work of breathing Heart- IRegular rate and rhythm, no murmurs, rubs or gallops, PMI not laterally displaced GI- soft, NT, ND, + BS Extremities- no clubbing, cyanosis, or edema Skin- no rash or lesion Psych- euthymic mood, full affect Neuro- strength and sensation are intact  LABS: Basic Metabolic Panel:  Recent Labs  41/63/84 0500 06/07/14 2210 06/08/14 0210  NA 143 143 144  K 4.3  4.7 4.5  CL 107 109 109  CO2 25 23 28   GLUCOSE 113* 184* 163*  BUN 30* 38* 41*  CREATININE 1.12 1.09 1.12  CALCIUM 8.3* 7.7* 7.5*  MG 2.1  --  2.1   CBC:  Recent Labs  06/08/14 0128 06/08/14 0604  WBC 12.8* 13.4*  HGB 10.6* 10.6*  HCT 32.0* 31.5*  MCV 97.9 96.0  PLT 169 185   Cardiac Enzymes:  Recent Labs  06/07/14 2210 06/08/14 0543  TROPONINI <0.30 <0.30    RADIOLOGY: Dg Chest Portable 1 View 06/02/2014   CLINICAL DATA:  Shortness of breath, fatigue, history of atrial fibrillation.  EXAM: PORTABLE CHEST - 1 VIEW  COMPARISON:  None.  FINDINGS: Markedly enlarged cardiac silhouette and mediastinal contours. Pulmonary venous congestion without frank evidence of edema. Bibasilar heterogeneous opacities, left greater than right. No definite pleural effusion, though note, the bilateral costophrenic angles are excluded from view. No pneumothorax. Unchanged bones.  IMPRESSION: Marked cardiomegaly and pulmonary venous congestion without frank evidence of edema. Further evaluation with a PA and lateral chest radiograph may be obtained as clinically indicated.   Electronically Signed   By: Simonne Come M.D.   On: 06/02/2014 10:54    ASSESSMENT AND PLAN:  Active Problems:   Persistent atrial fibrillation   Atrial fibrillation   Hematemesis   Coagulopathy  A difficult combination of problems. Whether he is in NSR or atrial fib, he has a risk of stroke off of anti-coagulation. I suspect it will be sometime before it is ok for systemic anti-coagulation. 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 Will continue tikosyn. If he does not stay in NSR after 4-5 doses (by tomorrow morning) then I would switch to amiodarone despite his relatively young age. 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  Leonia Reeves.D.

## 2014-06-08 NOTE — Progress Notes (Signed)
PULMONARY / CRITICAL CARE MEDICINE    Name: Bryan Moran MRN: [id number] DOB: 29-Dec-1945    ADMISSION DATE:  06/02/2014  PRIMARY SERVICE: PCCM  CHIEF COMPLAINT:  Hematemasis  BRIEF PATIENT DESCRIPTION: 69 M with recently-discovered Afib initially admitted with Afib with RVR. Eloquis was started as an inpatient and he underwent DC cardioversion following a TEE on 9/9. On the morning of 9/11 he developed hematemasis with high-risk findings on endoscopy and underwent mesenteric arteriogram with embolization. He was transferred to [facility] for post-procedure care.  SIGNIFICANT EVENTS / STUDIES:  Eloquis Started 9/6 TEE/DCCV 9/9 Hematemasis 9/11 EGD 9/11 with pyloric channel vs. Duodenal bulb clot with active bleeding Surg Consult 9/11 for possible emergent laparatomy 9/11>>>VIR embolization of GDA and right pyloric artery  LINES / TUBES: RIJ 9/11 3 PIVs  CULTURES: None  ANTIBIOTICS: None   SUBJECTIVE: Feeling much improved.  Off pressors. Denies abd pain, groin pain.  Does have mild nausea but no vomiting.   VITAL SIGNS: Temp:  [97.8 F (36.6 C)-98.7 F (37.1 C)] 98.3 F (36.8 C) (09/12 1158) Pulse Rate:  [39-141] 83 (09/12 1400) Resp:  [11-31] 22 (09/12 1400) BP: (86-157)/(49-137) 125/74 mmHg (09/12 1400) SpO2:  [88 %-100 %] 96 % (09/12 1400) FiO2 (%):  [55 %] 55 % (09/11 2130) Weight:  [253 lb 12 oz (115.1 kg)-255 lb 15.3 oz (116.1 kg)] 255 lb 15.3 oz (116.1 kg) (09/12 0500) HEMODYNAMICS:   VENTILATOR SETTINGS: Vent Mode:  [-]  FiO2 (%):  [55 %] 55 % INTAKE / OUTPUT: Intake/Output     09/11 0701 - 09/12 0700 09/12 0701 - 09/13 0700   P.O.     I.V. (mL/kg) 786.5 (6.8) 180.7 (1.6)   Total Intake(mL/kg) 786.5 (6.8) 180.7 (1.6)   Urine (mL/kg/hr) 550 (0.2)    Emesis/NG output 725 (0.3)    Total Output 1275     Net -488.5 +180.7        Urine Occurrence 1 x    Stool Occurrence 2 x 1 x   Emesis Occurrence 2 x      PHYSICAL EXAMINATION: General:  Pleasant male,  NAD Neuro:  Intact HEENT:  Sclera anicteric, conjunctiva pink, MMM, OP clear Neck: Trachea supple and midline, (-) LAN Cardiovascular:  Irreg RR, NS1/S2, (-) MRG Lungs:  CTAB Abdomen:  S/NT/ND/(+)BS Musculoskeletal:  (-) C/C/E, R groin dsg c/d Skin:  Vericose veins  LABS:  CBC  Recent Labs Lab 06/08/14 0128 06/08/14 0604 06/08/14 1320  WBC 12.8* 13.4* 11.1*  HGB 10.6* 10.6* 9.4*  HCT 32.0* 31.5* 28.5*  PLT 169 185 153   Coag's  Recent Labs Lab 06/02/14 1710 06/07/14 2210 06/08/14 0801  INR 1.32 1.40 1.36   BMET  Recent Labs Lab 06/07/14 2210 06/08/14 0210 06/08/14 0801  NA 143 144 146  K 4.7 4.5 4.7  CL 109 109 111  CO2 BUN 38* 41* 40*  CREATININE 1.09 1.12 1.11  GLUCOSE 184* 163* 157*   Electrolytes  Recent Labs Lab 06/06/14 0552 06/07/14 0500 06/07/14 2210 06/08/14 0210 06/08/14 0801  CALCIUM 8.2* 8.3* 7.7* 7.5* 7.7*  MG 2.1 2.1  --  2.1  --   PHOS  --   --   --   --  3.5   Sepsis Markers  Recent Labs Lab 06/07/14 2210 06/08/14 0210  LATICACIDVEN 1.5 1.2   ABG  Recent Labs Lab 06/07/14 2159  PHART 7.416  PCO2ART 40.2  PO2ART 78.0*   Liver Enzymes  Recent Labs Lab 06/02/14  1710 06/08/14 0801  AST 41* 16  ALT 113* 34  ALKPHOS 48 34*  BILITOT 2.3* 1.0  ALBUMIN 3.1* 2.3*   Cardiac Enzymes  Recent Labs Lab 06/02/14 1038 06/07/14 2210 06/08/14 0543  TROPONINI <0.30 <0.30 <0.30  PROBNP 4097.0*  --   --    Glucose  Recent Labs Lab 06/07/14 2137 06/08/14 0107 06/08/14 0435 06/08/14 0723 06/08/14 1136  GLUCAP 176* 179* 137* 127* 120*    Imaging No results found.  ASSESSMENT / PLAN:  Active Problems:   Persistent atrial fibrillation   Atrial fibrillation   Hematemesis   Coagulopathy   PULMONARY A: No acute issues: Currently on minimal nasal O2 which can be DCed.  CARDIOVASCULAR A: Acute systolic HF: Per cardiology likely 2/2 afib. No evidence of fluid overload on exam currently. Afib:  Currently in Afib. Holding AC and rate control agents for now. HTN:  Holding antihypertensives. P:   Follow-up cards recs Cont tikosyn  No anticoagulation   RENAL A: Likely AKI: Elevated BUN without elevation of Cr yet likely 2/2 prerenal AKI in setting of GIB. Hyperchloremia, mild P:   bmet in am  Avoid saline  GASTROINTESTINAL A: UGIB: S/p embolization after GI unable to adequately visualize the duodenum. HD stable. P:   Serial CBCs to follow but reduce freq Maintain adequate access Cont Protonix Drip If re-bleeds will need exp lap   HEMATOLOGIC A: Acute blood loss anemia:  P:   Maintain HgB > 7 q6 cbc to q12h scd  INFECTIOUS A: No acute issues  ENDOCRINE A: Hypergylcemia:  P:   SSI Check A1c  NEUROLOGIC A: No acute issue   TODAY'S SUMMARY:  Will leave in ICU overnight given high risk for re-bleeding. Monitor cbc closely.   I have personally obtained a history, examined the patient, evaluated laboratory and imaging results, formulated the assessment and plan and placed orders.  Dirk Dress, NP 06/08/2014  2:36 PM Pager: [phone] or [phone]  *Care during the described time interval was provided by me and/or other providers on the critical care team. I have reviewed this patient's available data, including medical history, events of note, physical examination and test results as part of my evaluation.  Mcarthur Rossetti. Tyson Alias, MD, FACP Pgr: [phone] [facility]

## 2014-06-08 NOTE — Procedures (Signed)
Interventional Radiology Procedure Note  Procedure: 1.) Placement right IJ triple lumen CVC.  Tip at superior CAJ and ready for use. 2.) Mesenteric angiogram 3.) Coil embolization of GDA and right gastric (pyloric) artery Access: Right common femoral artery Closure Device: Cordis ExoSeal Complications: None  Recommendations: - Bedrest x 4 hrs, first 2 hrs leg straight - Continue to trend H&H, transfuse as needed - If large volume bleeding recurs, pt may require surgical intervention  Signed,  Sterling Big, MD

## 2014-06-08 NOTE — Progress Notes (Signed)
1 Day Post-Op  Subjective: Awake and alert Hemodynamically stable Denies abdominal pain  Objective: Vital signs in last 24 hours: Temp:  [97.8 F (36.6 C)-98.7 F (37.1 C)] 98.7 F (37.1 C) (09/12 0430) Pulse Rate:  [31-102] 60 (09/12 0630) Resp:  [11-31] 20 (09/12 0630) BP: (86-152)/(51-137) 99/52 mmHg (09/12 0630) SpO2:  [88 %-100 %] 98 % (09/12 0630) FiO2 (%):  [55 %] 55 % (09/11 2130) Weight:  [253 lb 12 oz (115.1 kg)-255 lb 15.3 oz (116.1 kg)] 255 lb 15.3 oz (116.1 kg) (09/12 0500) Last BM Date: 06/07/14  Intake/Output from previous day: 09/11 0701 - 09/12 0700 In: 757 [I.V.:757] Out: 1275 [Urine:550; Emesis/NG output:725] Intake/Output this shift:    Abdomen soft, non-tender  Lab Results:   Recent Labs  06/08/14 0128 06/08/14 0604  WBC 12.8* 13.4*  HGB 10.6* 10.6*  HCT 32.0* 31.5*  PLT 169 185   BMET  Recent Labs  06/07/14 2210 06/08/14 0210  NA 143 144  K 4.7 4.5  CL 109 109  CO2 23 28  GLUCOSE 184* 163*  BUN 38* 41*  CREATININE 1.09 1.12  CALCIUM 7.7* 7.5*   PT/INR  Recent Labs  06/07/14 2210  LABPROT 17.2*  INR 1.40   ABG  Recent Labs  06/07/14 2159  PHART 7.416  HCO3 26.0*    Studies/Results: No results found.  Anti-infectives: Anti-infectives   Start     Dose/Rate Route Frequency Ordered Stop   06/07/14 2256  ceFAZolin (ANCEF) powder      Other As needed 06/07/14 2335 06/07/14 2256   06/07/14 2243  ceFAZolin (ANCEF) 2-3 GM-% IVPB SOLR    Comments:  Barley, Jenny   : cabinet override      06/07/14 2243 06/08/14 1059      Assessment/Plan: s/p Procedure(s): ESOPHAGOGASTRODUODENOSCOPY (EGD) (N/A)  Upper GI bleed  S/p IR with successful embolization.  H/H stable. If he re-bleeds, would need exploratory laparotomy Will follow  LOS: 6 days    Ronniesha Seibold A 06/08/2014

## 2014-06-09 DIAGNOSIS — K922 Gastrointestinal hemorrhage, unspecified: Secondary | ICD-10-CM

## 2014-06-09 LAB — CBC WITH DIFFERENTIAL/PLATELET
Basophils Absolute: 0 10*3/uL (ref 0.0–0.1)
Basophils Relative: 0 % (ref 0–1)
EOS PCT: 2 % (ref 0–5)
Eosinophils Absolute: 0.2 10*3/uL (ref 0.0–0.7)
HCT: 26 % — ABNORMAL LOW (ref 39.0–52.0)
HEMOGLOBIN: 8.3 g/dL — AB (ref 13.0–17.0)
LYMPHS ABS: 1.1 10*3/uL (ref 0.7–4.0)
Lymphocytes Relative: 13 % (ref 12–46)
MCH: 31.2 pg (ref 26.0–34.0)
MCHC: 31.9 g/dL (ref 30.0–36.0)
MCV: 97.7 fL (ref 78.0–100.0)
MONOS PCT: 8 % (ref 3–12)
Monocytes Absolute: 0.6 10*3/uL (ref 0.1–1.0)
Neutro Abs: 6.3 10*3/uL (ref 1.7–7.7)
Neutrophils Relative %: 77 % (ref 43–77)
Platelets: 141 10*3/uL — ABNORMAL LOW (ref 150–400)
RBC: 2.66 MIL/uL — ABNORMAL LOW (ref 4.22–5.81)
RDW: 14.7 % (ref 11.5–15.5)
WBC: 8.2 10*3/uL (ref 4.0–10.5)

## 2014-06-09 LAB — BASIC METABOLIC PANEL
ANION GAP: 6 (ref 5–15)
BUN: 30 mg/dL — AB (ref 6–23)
CHLORIDE: 108 meq/L (ref 96–112)
CO2: 27 meq/L (ref 19–32)
CREATININE: 0.94 mg/dL (ref 0.50–1.35)
Calcium: 7.3 mg/dL — ABNORMAL LOW (ref 8.4–10.5)
GFR calc Af Amer: >90 mL/min (ref >90)
GFR calc non Af Amer: 85 mL/min — ABNORMAL LOW (ref >90)
Glucose, Bld: 92 mg/dL (ref 70–99)
Potassium: 4 mEq/L (ref 3.7–5.3)
Sodium: 141 mEq/L (ref 137–147)

## 2014-06-09 LAB — GLUCOSE, CAPILLARY
GLUCOSE-CAPILLARY: 88 mg/dL (ref 70–99)
Glucose-Capillary: 83 mg/dL (ref 70–99)
Glucose-Capillary: 90 mg/dL (ref 70–99)
Glucose-Capillary: 95 mg/dL (ref 70–99)

## 2014-06-09 MED ORDER — INFLUENZA VAC SPLIT QUAD 0.5 ML IM SUSY
0.5000 mL | PREFILLED_SYRINGE | INTRAMUSCULAR | Status: AC
  Administered 2014-06-10: 0.5 mL via INTRAMUSCULAR
  Filled 2014-06-09: qty 0.5

## 2014-06-09 MED ORDER — DOFETILIDE 250 MCG PO CAPS
250.0000 ug | ORAL_CAPSULE | Freq: Two times a day (BID) | ORAL | Status: DC
  Filled 2014-06-09 (×4): qty 1

## 2014-06-09 NOTE — Progress Notes (Signed)
2 Days Post-Op  Subjective: NO COMPLAINTS FEELS WELL  Objective: Vital signs in last 24 hours: Temp:  [97.9 F (36.6 C)-98.6 F (37 C)] 98 F (36.7 C) (09/13 0749) Pulse Rate:  [40-141] 66 (09/13 0700) Resp:  [11-25] 17 (09/13 0700) BP: (87-157)/(49-127) 111/62 mmHg (09/13 0700) SpO2:  [91 %-100 %] 93 % (09/13 0700) Weight:  [255 lb 11.7 oz (116 kg)] 255 lb 11.7 oz (116 kg) (09/13 0500) Last BM Date: 06/08/14  Intake/Output from previous day: 09/12 0701 - 09/13 0700 In: 905.7 [P.O.:300; I.V.:605.7] Out: 1800 [Urine:1800] Intake/Output this shift:    GI: soft, non-tender; bowel sounds normal; no masses,  no organomegaly  Lab Results:   Recent Labs  06/08/14 1650 06/09/14 0245  WBC 10.0 8.2  HGB 9.1* 8.3*  HCT 27.7* 26.0*  PLT 145* 141*   BMET  Recent Labs  06/08/14 0801 06/09/14 0245  NA 146 141  K 4.7 4.0  CL 111 108  CO2 27 27  GLUCOSE 157* 92  BUN 40* 30*  CREATININE 1.11 0.94  CALCIUM 7.7* 7.3*   PT/INR  Recent Labs  06/07/14 2210 06/08/14 0801  LABPROT 17.2* 16.8*  INR 1.40 1.36   ABG  Recent Labs  06/07/14 2159  PHART 7.416  HCO3 26.0*    Studies/Results: No results found.  Anti-infectives: Anti-infectives   Start     Dose/Rate Route Frequency Ordered Stop   06/07/14 2256  ceFAZolin (ANCEF) powder      Other As needed 06/07/14 2335 06/07/14 2256   06/07/14 2243  ceFAZolin (ANCEF) 2-3 GM-% IVPB SOLR    Comments:  Barley, Jenny   : cabinet override      06/07/14 2243 06/08/14 1059      Assessment/Plan: s/p Procedure(s): ESOPHAGOGASTRODUODENOSCOPY (EGD) (N/A) Patient Active Problem List   Diagnosis Date Noted  . Hematemesis 06/07/2014  . Coagulopathy 06/07/2014  . Atrial fibrillation 06/05/2014  . Persistent atrial fibrillation 06/02/2014  UGIB   Looks well.  hgb 8.3 Follow  No acute signs of bleeding but will follow along Can feed from surgery standpoit once ok with GI  LOS: 7 days    Bryan Moran  A. 06/09/2014

## 2014-06-09 NOTE — Progress Notes (Signed)
Patient has had three bowel movements consisting of brownish-black blood clots since transferring from this afternoon.  No frank, Bachand blood noted.  Patient aware.  Will continue to monitor.

## 2014-06-09 NOTE — Progress Notes (Signed)
Patient arrived on unit, transferred from .  Report received from RN on .  Vital signs stable.  Patient denies any questions or concerns at this time.  Will continue to monitor.

## 2014-06-09 NOTE — Progress Notes (Signed)
I did an EKG to check QTc length and it was 610.  Called MD on-call cardiology group and he stated to hold the dose of tikosyn tonight and recheck the EKG in the morning prior to the next dose due at 0800.  Will continue to monitor patient heart rhythm.

## 2014-06-09 NOTE — Progress Notes (Signed)
HR ranging from mid 40s to 70s.  EKG performed and in chart as rhythm appeared to be rapidly switching between junctional and SR with PACs on telemetry monitor.  Patient asymptomatic.  Bjorn Loser, Cardiology PA notified.  Tikosyn dose changed by MD.  Patient made aware, will continue to monitor.

## 2014-06-09 NOTE — Progress Notes (Signed)
ECG at 1 PM shows a persistent LBBB and QTc now 595 ms. Seems much longer even when adjusting for the prolonged QRS/IVCD Reduce dofetilide to 250 mcg q12h.

## 2014-06-09 NOTE — Progress Notes (Signed)
PULMONARY / CRITICAL CARE MEDICINE    Name: Bryan Moran MRN: [id number] DOB: [birth year]    ADMISSION DATE:  06/02/2014  PRIMARY SERVICE: PCCM  CHIEF COMPLAINT:  Hematemasis  BRIEF PATIENT DESCRIPTION: 71 M with recently-discovered Afib initially admitted with Afib with RVR. Eloquis was started as an inpatient and he underwent DC cardioversion following a TEE on 9/9. On the morning of 9/11 he developed hematemasis with high-risk findings on endoscopy and underwent mesenteric arteriogram with embolization. He was transferred to [facility] for post-procedure care.  SIGNIFICANT EVENTS / STUDIES:  Eloquis Started 9/6 TEE/DCCV 9/9 Hematemasis 9/11 EGD 9/11 with pyloric channel vs. Duodenal bulb clot with active bleeding Surg Consult 9/11 for possible emergent laparatomy 9/11>>>VIR embolization of GDA and right pyloric artery  LINES / TUBES: RIJ 9/11 3 PIVs  CULTURES: None  ANTIBIOTICS: None   SUBJECTIVE: no bleeding, BP good  VITAL SIGNS: Temp:  [97.9 F (36.6 C)-98.6 F (37 C)] 98 F (36.7 C) (09/13 0749) Pulse Rate:  [40-141] 66 (09/13 1000) Resp:  [11-25] 19 (09/13 1000) BP: (87-157)/(49-127) 127/71 mmHg (09/13 1000) SpO2:  [91 %-100 %] 96 % (09/13 1000) Weight:  [116 kg (255 lb 11.7 oz)] 116 kg (255 lb 11.7 oz) (09/13 0500) HEMODYNAMICS:   VENTILATOR SETTINGS:   INTAKE / OUTPUT: Intake/Output     09/12 0701 - 09/13 0700 09/13 0701 - 09/14 0700   P.O. 300    I.V. (mL/kg) 605.7 (5.2) 75 (0.6)   Total Intake(mL/kg) 905.7 (7.8) 75 (0.6)   Urine (mL/kg/hr) 1800 (0.6)    Emesis/NG output     Total Output 1800     Net -894.3 +75        Stool Occurrence 2 x      PHYSICAL EXAMINATION: General:  Pleasant male, NAD Neuro:  Intact nonfocal HEENT:  jvd wnl Neck: Trachea supple and midline, (-) LAN Cardiovascular: s 1 s2 rrr Lungs:  CTAB Abdomen:  S/NT/ND/(+)BS Skin:  Vericose veins  LABS:  CBC  Recent Labs Lab 06/08/14 1320 06/08/14 1650 06/09/14 0245  WBC  11.1* 10.0 8.2  HGB 9.4* 9.1* 8.3*  HCT 28.5* 27.7* 26.0*  PLT 153 145* 141*   Coag's  Recent Labs Lab 06/02/14 1710 06/07/14 2210 06/08/14 0801  INR 1.32 1.40 1.36   BMET  Recent Labs Lab 06/08/14 0210 06/08/14 0801 06/09/14 0245  NA 144 146 141  K 4.5 4.7 4.0  CL 109 111 108  CO2 28 27 27   BUN 41* 40* 30*  CREATININE 1.12 1.11 0.94  GLUCOSE 163* 157* 92   Electrolytes  Recent Labs Lab 06/06/14 0552 06/07/14 0500  06/08/14 0210 06/08/14 0801 06/09/14 0245  CALCIUM 8.2* 8.3*  < > 7.5* 7.7* 7.3*  MG 2.1 2.1  --  2.1  --   --   PHOS  --   --   --   --  3.5  --   < > = values in this interval not displayed. Sepsis Markers  Recent Labs Lab 06/07/14 2210 06/08/14 0210  LATICACIDVEN 1.5 1.2   ABG  Recent Labs Lab 06/07/14 2159  PHART 7.416  PCO2ART 40.2  PO2ART 78.0*   Liver Enzymes  Recent Labs Lab 06/02/14 1710 06/08/14 0801  AST 41* 16  ALT 113* 34  ALKPHOS 48 34*  BILITOT 2.3* 1.0  ALBUMIN 3.1* 2.3*   Cardiac Enzymes  Recent Labs Lab 06/02/14 1038 06/07/14 2210 06/08/14 0543 06/08/14 1320  TROPONINI <0.30 <0.30 <0.30 <0.30  PROBNP 4097.0*  --   --   --  Glucose  Recent Labs Lab 06/08/14 1136 06/08/14 1528 06/08/14 1939 06/09/14 0001 06/09/14 0304 06/09/14 0719  GLUCAP 120* 99 89 90 95 83    Imaging No results found.  ASSESSMENT / PLAN:  Active Problems:   Persistent atrial fibrillation   Atrial fibrillation   Hematemesis   Coagulopathy   PULMONARY A: No acute issues:  IS Out of bed  CARDIOVASCULAR A: Acute systolic HF: Per cardiology likely 2/2 afib. No evidence of fluid overload on exam currently. Afib: Currently in Afib. Holding AC and rate control agents for now. HTN:  Holding antihypertensives. P:   Cards managing tele Cont tikosyn   RENAL A: Likely AKI: Elevated BUN without elevation of Cr yet likely 2/2 prerenal AKI in setting of GIB. Hyperchloremia, mild P:   Saline  lock  GASTROINTESTINAL A: UGIB: S/p embolization after GI unable to adequately visualize the duodenum. HD stable. P:   Cbc to am  Avoid overload Clears per GI  HEMATOLOGIC A: Acute blood loss anemia: nothig active P:   Cbc to am  scd  INFECTIOUS A: No acute issues  ENDOCRINE A: Hypergylcemia: resolved P:   SSI dc   NEUROLOGIC A: not mobile To chair May need pt   TODAY'S SUMMARY:  To tele, will sign off, mobilize, dc ssi  I have personally obtained a history, examined the patient, evaluated laboratory and imaging results, formulated the assessment and plan and placed orders.  *Care during the described time interval was provided by me and/or other providers on the critical care team. I have reviewed this patient's available data, including medical history, events of note, physical examination and test results as part of my evaluation.  Mcarthur Rossetti. Tyson Alias, MD, FACP Pgr: [phone] [facility]

## 2014-06-09 NOTE — Progress Notes (Signed)
Patient Name: Bryan Moran Date of Encounter: 06/09/2014  Active Problems:   Persistent atrial fibrillation   Atrial fibrillation   Hematemesis   Coagulopathy   Length of Stay: 7  SUBJECTIVE  Feeling generally better. Bleeding seems to have stopped. While examining him he went in and out of AF repeatedly. While in AF, rate is well controlled (80). When in in sinus rhythm he is bradycardic (40-50)  He reports taking a lot of Aleve in recent past.  CURRENT MEDS . dofetilide  500 mcg Oral Q12H  . [START ON 06/10/2014] pantoprazole (PROTONIX) IV  40 mg Intravenous Q12H    OBJECTIVE   Intake/Output Summary (Last 24 hours) at 06/09/14 1051 Last data filed at 06/09/14 1000  Gross per 24 hour  Intake    900 ml  Output   1800 ml  Net   -900 ml   Filed Weights   06/07/14 2200 06/08/14 0500 06/09/14 0500  Weight: 253 lb 12 oz (115.1 kg) 255 lb 15.3 oz (116.1 kg) 255 lb 11.7 oz (116 kg)    PHYSICAL EXAM Filed Vitals:   06/09/14 0749 06/09/14 0800 06/09/14 0900 06/09/14 1000  BP:  106/57 130/66 127/71  Pulse:  70 93 66  Temp: 98 F (36.7 C)     TempSrc: Oral     Resp:  16 19 19   Height:      Weight:      SpO2:  96% 98% 96%   General: Alert, oriented x3, no distress; pale Head: no evidence of trauma, PERRL, EOMI, no exophtalmos or lid lag, no myxedema, no xanthelasma; normal ears, nose and oropharynx Neck: normal jugular venous pulsations and no hepatojugular reflux; brisk carotid pulses without delay and no carotid bruits Chest: clear to auscultation, no signs of consolidation by percussion or palpation, normal fremitus, symmetrical and full respiratory excursions Cardiovascular: normal position and quality of the apical impulse, irregular rhythm, normal first and second heart sounds, no rubs or gallops, no murmur Abdomen: no tenderness or distention, no masses by palpation, no abnormal pulsatility or arterial bruits, normal bowel sounds, no hepatosplenomegaly Extremities: no  clubbing, cyanosis or edema; 2+ radial, ulnar and brachial pulses bilaterally; 2+ right femoral, posterior tibial and dorsalis pedis pulses; 2+ left femoral, posterior tibial and dorsalis pedis pulses; no subclavian or femoral bruits Neurological: grossly nonfocal  LABS  CBC  Recent Labs  06/08/14 1650 06/09/14 0245  WBC 10.0 8.2  NEUTROABS 8.0* 6.3  HGB 9.1* 8.3*  HCT 27.7* 26.0*  MCV 97.2 97.7  PLT 145* 141*   Basic Metabolic Panel  Recent Labs  06/07/14 0500  06/08/14 0210 06/08/14 0801 06/09/14 0245  NA 143  < > 144 146 141  K 4.3  < > 4.5 4.7 4.0  CL 107  < > 109 111 108  CO2 25  < > 28 27 27   GLUCOSE 113*  < > 163* 157* 92  BUN 30*  < > 41* 40* 30*  CREATININE 1.12  < > 1.12 1.11 0.94  CALCIUM 8.3*  < > 7.5* 7.7* 7.3*  MG 2.1  --  2.1  --   --   PHOS  --   --   --  3.5  --   < > = values in this interval not displayed. Liver Function Tests  Recent Labs  06/08/14 0801  AST 16  ALT 34  ALKPHOS 34*  BILITOT 1.0  PROT 4.3*  ALBUMIN 2.3*   No results found for this basename: LIPASE, AMYLASE,  in the last 72 hours Cardiac Enzymes  Recent Labs  06/07/14 2210 06/08/14 0543 06/08/14 1320  TROPONINI <0.30 <0.30 <0.30   BNP No components found with this basename: POCBNP,  D-Dimer No results found for this basename: DDIMER,  in the last 72 hours Hemoglobin A1C  Recent Labs  06/08/14 0128  HGBA1C 6.2*    Radiology Studies Imaging results have been reviewed and No results found.  TELE Mostly AF with controlled rate, periods of sinus bradycardia  ECG Sinus brady, rate related LBBB, QTc 470 ms  ASSESSMENT AND PLAN Keep on dofetilide Resume anticoagulation once ulcer healed. No clinical signs of CHF   Thurmon Fair, MD, [facility] [phone] office [phone] pager 06/09/2014 10:51 AM

## 2014-06-09 NOTE — Progress Notes (Signed)
Subjective: No complaints.  Feeling well.  Objective: Vital signs in last 24 hours: Temp:  [97.9 F (36.6 C)-98.6 F (37 C)] 98 F (36.7 C) (09/13 0749) Pulse Rate:  [40-141] 70 (09/13 0800) Resp:  [11-25] 16 (09/13 0800) BP: (87-157)/(49-127) 106/57 mmHg (09/13 0800) SpO2:  [91 %-100 %] 96 % (09/13 0800) Weight:  [116 kg (255 lb 11.7 oz)] 116 kg (255 lb 11.7 oz) (09/13 0500) Last BM Date: 06/08/14  Intake/Output from previous day: 09/12 0701 - 09/13 0700 In: 905.7 [P.O.:300; I.V.:605.7] Out: 1800 [Urine:1800] Intake/Output this shift: Total I/O In: 25 [I.V.:25] Out: -   General appearance: alert and no distress GI: soft, non-tender; bowel sounds normal; no masses,  no organomegaly  Lab Results:  Recent Labs  06/08/14 1320 06/08/14 1650 06/09/14 0245  WBC 11.1* 10.0 8.2  HGB 9.4* 9.1* 8.3*  HCT 28.5* 27.7* 26.0*  PLT 153 145* 141*   BMET  Recent Labs  06/08/14 0210 06/08/14 0801 06/09/14 0245  NA 144 146 141  K 4.5 4.7 4.0  CL 109 111 108  CO2 28 27 27   GLUCOSE 163* 157* 92  BUN 41* 40* 30*  CREATININE 1.12 1.11 0.94  CALCIUM 7.5* 7.7* 7.3*   LFT  Recent Labs  06/08/14 0801  PROT 4.3*  ALBUMIN 2.3*  AST 16  ALT 34  ALKPHOS 34*  BILITOT 1.0   PT/INR  Recent Labs  06/07/14 2210 06/08/14 0801  LABPROT 17.2* 16.8*  INR 1.40 1.36   Hepatitis Panel No results found for this basename: HEPBSAG, HCVAB, HEPAIGM, HEPBIGM,  in the last 72 hours C-Diff No results found for this basename: CDIFFTOX,  in the last 72 hours Fecal Lactopherrin No results found for this basename: FECLLACTOFRN,  in the last 72 hours  Studies/Results: No results found.  Medications:  Scheduled: . dofetilide  500 mcg Oral Q12H  . insulin aspart  1-3 Units Subcutaneous 6 times per day  . [START ON 06/10/2014] pantoprazole (PROTONIX) IV  40 mg Intravenous Q12H   Continuous: . pantoprozole (PROTONIX) infusion 8 mg/hr (06/09/14 0521)    Assessment/Plan: 1)  Presumed duodenal ulcer bleed - severe. 2) Afib.   The patient is well.  HGB dropped as anticipated, but there is no overt evidence of bleeding.  He can be advanced to a clear liquid diet.  His clot from the duodenum was obstructing the pylorus and it will be prudent to start on clears first.  Plan: 1) Clear liquid diet. 2) Follow HGB. 3) Continue with Protonix.   LOS: 7 days   Taylour Lietzke D 06/09/2014, 8:23 AM

## 2014-06-10 ENCOUNTER — Encounter (HOSPITAL_COMMUNITY): Payer: Self-pay | Admitting: Gastroenterology

## 2014-06-10 DIAGNOSIS — D62 Acute posthemorrhagic anemia: Secondary | ICD-10-CM

## 2014-06-10 DIAGNOSIS — K260 Acute duodenal ulcer with hemorrhage: Secondary | ICD-10-CM | POA: Diagnosis present

## 2014-06-10 DIAGNOSIS — K250 Acute gastric ulcer with hemorrhage: Secondary | ICD-10-CM

## 2014-06-10 HISTORY — DX: Acute duodenal ulcer with hemorrhage: K26.0

## 2014-06-10 LAB — CBC WITH DIFFERENTIAL/PLATELET
BASOS ABS: 0 10*3/uL (ref 0.0–0.1)
BASOS PCT: 0 % (ref 0–1)
EOS PCT: 4 % (ref 0–5)
Eosinophils Absolute: 0.3 10*3/uL (ref 0.0–0.7)
HCT: 24.6 % — ABNORMAL LOW (ref 39.0–52.0)
Hemoglobin: 8.1 g/dL — ABNORMAL LOW (ref 13.0–17.0)
Lymphocytes Relative: 16 % (ref 12–46)
Lymphs Abs: 1.2 10*3/uL (ref 0.7–4.0)
MCH: 31.4 pg (ref 26.0–34.0)
MCHC: 32.9 g/dL (ref 30.0–36.0)
MCV: 95.3 fL (ref 78.0–100.0)
Monocytes Absolute: 0.7 10*3/uL (ref 0.1–1.0)
Monocytes Relative: 11 % (ref 3–12)
Neutro Abs: 4.8 10*3/uL (ref 1.7–7.7)
Neutrophils Relative %: 69 % (ref 43–77)
PLATELETS: 150 10*3/uL (ref 150–400)
RBC: 2.58 MIL/uL — ABNORMAL LOW (ref 4.22–5.81)
RDW: 14.2 % (ref 11.5–15.5)
WBC: 7 10*3/uL (ref 4.0–10.5)

## 2014-06-10 LAB — H. PYLORI ANTIBODY, IGG: H PYLORI IGG: 0.54 {ISR}

## 2014-06-10 MED ORDER — AMIODARONE HCL 200 MG PO TABS
400.0000 mg | ORAL_TABLET | Freq: Two times a day (BID) | ORAL | Status: DC
  Administered 2014-06-10 – 2014-06-14 (×8): 400 mg via ORAL
  Filled 2014-06-10 (×10): qty 2

## 2014-06-10 MED ORDER — AMIODARONE HCL 200 MG PO TABS
400.0000 mg | ORAL_TABLET | Freq: Two times a day (BID) | ORAL | Status: DC
  Filled 2014-06-10 (×2): qty 2

## 2014-06-10 NOTE — Progress Notes (Signed)
Patient ID: [name], male   DOB: 1945/11/09, 68 y.o.   MRN: [id number]   Patient Name: Bryan Moran Date of Encounter: 06/10/2014     Active Problems:   Persistent atrial fibrillation   Atrial fibrillation   Hematemesis   Coagulopathy    SUBJECTIVE  "I feel better today,"denies chest pain or sob or palpitations or nausea.   CURRENT MEDS . dofetilide  250 mcg Oral BID  . Influenza vac split quadrivalent PF  0.5 mL Intramuscular Tomorrow-1000  . pantoprazole (PROTONIX) IV  40 mg Intravenous Q12H    OBJECTIVE  Filed Vitals:   06/09/14 1750 06/09/14 2035 06/09/14 2113 06/10/14 0703  BP: 125/50 139/86 140/68 130/68  Pulse: 94 97 90 71  Temp: 98.5 F (36.9 C) 98.3 F (36.8 C) 98.7 F (37.1 C) 97.9 F (36.6 C)  TempSrc: Oral Oral Oral Oral  Resp: Height:      Weight:    254 lb 13.6 oz (115.6 kg)  SpO2: 99% 99% 100% 97%    Intake/Output Summary (Last 24 hours) at 06/10/14 1312 Last data filed at 06/10/14 0910  Gross per 24 hour  Intake 1901.67 ml  Output    202 ml  Net 1699.67 ml   Filed Weights   06/09/14 0500 06/09/14 1158 06/10/14 0703  Weight: 255 lb 11.7 oz (116 kg) 254 lb 13.6 oz (115.6 kg) 254 lb 13.6 oz (115.6 kg)    PHYSICAL EXAM  General: Pleasant, 68 yo man, NAD. Neuro: Alert and oriented X 3. Moves all extremities spontaneously. Psych: Normal affect. HEENT:  Normal  Neck: Supple without bruits or JVD. Lungs:  Resp regular and unlabored, CTA. Heart: RRR no s3, s4, or murmurs. Abdomen: Soft, non-tender, non-distended, BS + x 4.  Extremities: No clubbing, cyanosis or edema. DP/PT/Radials 2+ and equal bilaterally.  Accessory Clinical Findings  CBC  Recent Labs  06/09/14 0245 06/10/14 0439  WBC 8.2 7.0  NEUTROABS 6.3 4.8  HGB 8.3* 8.1*  HCT 26.0* 24.6*  MCV 97.7 95.3  PLT 141* 150   Basic Metabolic Panel  Recent Labs  06/08/14 0210 06/08/14 0801 06/09/14 0245  NA 144 146 141  K 4.5 4.7 4.0  CL 109 111 108  CO2 GLUCOSE 163* 157* 92  BUN 41* 40* 30*  CREATININE 1.12 1.11 0.94  CALCIUM 7.5* 7.7* 7.3*  MG 2.1  --   --   PHOS  --  3.5  --    Liver Function Tests  Recent Labs  06/08/14 0801  AST 16  ALT 34  ALKPHOS 34*  BILITOT 1.0  PROT 4.3*  ALBUMIN 2.3*   No results found for this basename: LIPASE, AMYLASE,  in the last 72 hours Cardiac Enzymes  Recent Labs  06/07/14 2210 06/08/14 0543 06/08/14 1320  TROPONINI <0.30 <0.30 <0.30   BNP No components found with this basename: POCBNP,  D-Dimer No results found for this basename: DDIMER,  in the last 72 hours Hemoglobin A1C  Recent Labs  06/08/14 0128  HGBA1C 6.2*   Fasting Lipid Panel No results found for this basename: CHOL, HDL, LDLCALC, TRIG, CHOLHDL, LDLDIRECT,  in the last 72 hours Thyroid Function Tests No results found for this basename: TSH, T4TOTAL, FREET3, T3FREE, THYROIDAB,  in the last 72 hours  TELE  NSR with PAF and frequent atrial ectopy.   Radiology/Studies  Dg Chest Portable 1 View  06/02/2014   CLINICAL DATA:  Shortness of breath, fatigue, history  of atrial fibrillation.  EXAM: PORTABLE CHEST - 1 VIEW  COMPARISON:  None.  FINDINGS: Markedly enlarged cardiac silhouette and mediastinal contours. Pulmonary venous congestion without frank evidence of edema. Bibasilar heterogeneous opacities, left greater than right. No definite pleural effusion, though note, the bilateral costophrenic angles are excluded from view. No pneumothorax. Unchanged bones.  IMPRESSION: Marked cardiomegaly and pulmonary venous congestion without frank evidence of edema. Further evaluation with a PA and lateral chest radiograph may be obtained as clinically indicated.   Electronically Signed   By: Simonne Come M.D.   On: 06/02/2014 10:54    ASSESSMENT AND PLAN  1. Atrial fib 2. GI bleeding 3. New onset of Tikosyn Rec: The patient's QT is still elevated. He is not staying in rhythm as well as I would expect and still having a very  long QT interval. I am going to stop Tikosyn and switch to amiodarone.  Hopefully there will be no additional GI bleeding. Hopefully he can be discharged home in a couple of days. Will follow H/H.    Gregg Taylor,M.D.  06/10/2014 1:12 PM

## 2014-06-10 NOTE — Progress Notes (Signed)
Daily Rounding Note  06/10/2014, 10:11 AM  LOS: 8 days   SUBJECTIVE:       Wants [place] to know he really feels better.  Stools still with leaching of [place] blood but overall dark and c/w passing old blood. No abd pain, not dizzy when up to bathroom. Tolerating clears and diet was advanced to [facility].   OBJECTIVE:         Vital signs in last 24 hours:    Temp:  [97.7 F (36.5 C)-98.7 F (37.1 C)] 97.9 F (36.6 C) (09/14 0703) Pulse Rate:  [39-97] 71 (09/14 0703) Resp:  [16-21] 21 (09/14 0703) BP: (104-140)/(46-86) 130/68 mmHg (09/14 0703) SpO2:  [97 %-100 %] 97 % (09/14 0703) Weight:  [115.6 kg (254 lb 13.6 oz)] 115.6 kg (254 lb 13.6 oz) (09/14 0703) Last BM Date: 06/09/14 General: looks well, still pale but not as bad as 9/11   Heart: rrr Chest: clear bil.  No dyspnea Abdomen: soft, NT, active BS  Extremities: no CCE Neuro/Psych:  Oriented x 3.  No tremor or limb weakness.   Intake/Output from previous day: 09/13 0701 - 09/14 0700 In: 1381.7 [P.O.:1120; I.V.:261.7] Out: 502 [Urine:500; Stool:2]  Intake/Output this shift: Total I/O In: 620 [P.O.:620] Out: 201 [Urine:200; Stool:1]  Lab Results:  Recent Labs  06/08/14 1650 06/09/14 0245 06/10/14 0439  WBC 10.0 8.2 7.0  HGB 9.1* 8.3* 8.1*  HCT 27.7* 26.0* 24.6*  PLT 145* 141* 150   BMET  Recent Labs  06/08/14 0210 06/08/14 0801 06/09/14 0245  NA 144 146 141  K 4.5 4.7 4.0  CL 109 111 108  CO2 28 27 27   GLUCOSE 163* 157* 92  BUN 41* 40* 30*  CREATININE 1.12 1.11 0.94  CALCIUM 7.5* 7.7* 7.3*   LFT  Recent Labs  06/08/14 0801  PROT 4.3*  ALBUMIN 2.3*  AST 16  ALT 34  ALKPHOS 34*  BILITOT 1.0   PT/INR  Recent Labs  06/07/14 2210 06/08/14 0801  LABPROT 17.2* 16.8*  INR 1.40 1.36   Scheduled Meds: . dofetilide  250 mcg Oral BID  . Influenza vac split quadrivalent PF  0.5 mL Intramuscular Tomorrow-1000  . pantoprazole (PROTONIX) IV  40 mg  Intravenous Q12H   Continuous Infusions:  PRN Meds:.ALPRAZolam, ELETONE, ondansetron (ZOFRAN) IV, promethazine   ASSESMENT:   * Hematemesis. Melena. Stool not yet brown.  9/11 EGD: ulcer and pyloric channel vs duodenum.  Large adherent clot.  S/p 9/111 -9/12   Acute Blood Loss Anemia  1.) Placement right IJ triple lumen CVC. Tip at superior CAJ and ready for use.  2.) Mesenteric angiogram  3.) Coil embolization of GDA and right gastric (pyloric) artery On IV, BID Protonix.   *  ABL anemia. No transfusions to date.  Hgb down about 3 grams overall since 9/11 bleed.   * Rapid Afib. Maintaining SR after TEE.  Eliquis discontinued on 9/11, last dose 9/10.  Tikosyn dose reduced 9/13 due to prolonged QTc.   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      PLAN   *  Heart healthy diet. CBC in AM. *  Important question is when it will be safe to resume Eliquis or similar med?  *  Follow CBC.     Jennye Moccasin  06/10/2014, 10:11 AM Pager: [phone]  [facility] Attending  I have also seen and assessed the patient and agree with the above note. He  is improving but still requires critical care in ICU due to GI bleed and co-morbidities. I think it will be useful to re-loo with an EGD Wed or Thursday as long as he is improving. This will allow characterization of the lesion better and help me decide when to allow resumption of A/C Tx.  Iva Boop, MD, [facility] [phone] (pager) 06/10/2014 5:57 PM

## 2014-06-10 NOTE — Progress Notes (Signed)
[facility] Progress Note  3 Days Post-Op  Subjective: Doing well, no N/V or abdominal pain.  Says he continues to have some rectal bleeding, but not bad.  He thinks is [place] in color.  Tolerating clear liquids.  Wants to get up to ambulate.  Objective: Vital signs in last 24 hours: Temp:  [97.7 F (36.5 C)-98.7 F (37.1 C)] 97.9 F (36.6 C) (09/14 0703) Pulse Rate:  [39-97] 71 (09/14 0703) Resp:  [16-21] 21 (09/14 0703) BP: (104-140)/(46-86) 130/68 mmHg (09/14 0703) SpO2:  [96 %-100 %] 97 % (09/14 0703) Weight:  [254 lb 13.6 oz (115.6 kg)] 254 lb 13.6 oz (115.6 kg) (09/14 0703) Last BM Date: 06/09/14  Intake/Output from previous day: 09/13 0701 - 09/14 0700 In: 1381.7 [P.O.:1120; I.V.:261.7] Out: 502 [Urine:500; Stool:2] Intake/Output this shift: Total I/O In: 620 [P.O.:620] Out: 201 [Urine:200; Stool:1]  PE: Gen:  Alert, NAD, pleasant Card:  RRR, no M/G/R heard Pulm:  CTA, no W/R/R Abd: Soft, NT/ND, +BS, no HSM   Lab Results:   Recent Labs  06/09/14 0245 06/10/14 0439  WBC 8.2 7.0  HGB 8.3* 8.1*  HCT 26.0* 24.6*  PLT 141* 150   BMET  Recent Labs  06/08/14 0801 06/09/14 0245  NA 146 141  K 4.7 4.0  CL 111 108  CO2 27 27  GLUCOSE 157* 92  BUN 40* 30*  CREATININE 1.11 0.94  CALCIUM 7.7* 7.3*   PT/INR  Recent Labs  06/07/14 2210 06/08/14 0801  LABPROT 17.2* 16.8*  INR 1.40 1.36   CMP     Component Value Date/Time   NA 141 06/09/2014 0245   K 4.0 06/09/2014 0245   CL 108 06/09/2014 0245   CO2 27 06/09/2014 0245   GLUCOSE 92 06/09/2014 0245   BUN 30* 06/09/2014 0245   CREATININE 0.94 06/09/2014 0245   CALCIUM 7.3* 06/09/2014 0245   PROT 4.3* 06/08/2014 0801   ALBUMIN 2.3* 06/08/2014 0801   AST 16 06/08/2014 0801   ALT 34 06/08/2014 0801   ALKPHOS 34* 06/08/2014 0801   BILITOT 1.0 06/08/2014 0801   GFRNONAA 85* 06/09/2014 0245   GFRAA >90 06/09/2014 0245   Lipase  No results found for this basename: lipase        Studies/Results: No results found.  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  Hgb pretty stable, only down to 8.1 from 8.3 yesterday 2.  Hold Eloquis for now 3.  On clear liquid diet, could advance to fulls from our perspective if okay with GI 4.  Ambulate and IS 5.  Not currently on DVT proph due to GI bleed 6.  SCD's 7.  No surgical interventions needed, continue maximal medical management including protonix    LOS: 8 days    DORT, Shatika Grinnell 06/10/2014, 9:49 AM Pager: [phone]

## 2014-06-10 NOTE — Progress Notes (Signed)
Pt had 2 BM's today with large clots, 2nd BM decreasing in amount of clots. Emelda Brothers RN

## 2014-06-10 NOTE — Progress Notes (Signed)
UR completed Charrie Mcconnon K. Allex Lapoint, RN, BSN, MSHL, CCM  06/10/2014 11:08 AM

## 2014-06-10 NOTE — Progress Notes (Signed)
Some dark [place]/black stools, no hematemesis abdomen soft, advance diet as you wish, recheck hct in am. If rebleeds according to literature next step should be repeat endoscopy leaving surgery for failure

## 2014-06-10 NOTE — Progress Notes (Signed)
Dr. Ladona Ridgel notified tikosyn held due to QTC 567. New orders given. Emelda Brothers RN

## 2014-06-11 LAB — TYPE AND SCREEN
ABO/RH(D): O POS
Antibody Screen: NEGATIVE
UNIT DIVISION: 0
UNIT DIVISION: 0

## 2014-06-11 LAB — CBC
HCT: 24 % — ABNORMAL LOW (ref 39.0–52.0)
HEMOGLOBIN: 8.1 g/dL — AB (ref 13.0–17.0)
MCH: 31.9 pg (ref 26.0–34.0)
MCHC: 33.8 g/dL (ref 30.0–36.0)
MCV: 94.5 fL (ref 78.0–100.0)
Platelets: 168 10*3/uL (ref 150–400)
RBC: 2.54 MIL/uL — AB (ref 4.22–5.81)
RDW: 14.2 % (ref 11.5–15.5)
WBC: 6.2 10*3/uL (ref 4.0–10.5)

## 2014-06-11 LAB — BASIC METABOLIC PANEL
Anion gap: 10 (ref 5–15)
BUN: 14 mg/dL (ref 6–23)
CALCIUM: 7.4 mg/dL — AB (ref 8.4–10.5)
CO2: 24 meq/L (ref 19–32)
Chloride: 106 mEq/L (ref 96–112)
Creatinine, Ser: 0.91 mg/dL (ref 0.50–1.35)
GFR calc Af Amer: >90 mL/min (ref >90)
GFR, EST NON AFRICAN AMERICAN: 86 mL/min — AB (ref >90)
Glucose, Bld: 97 mg/dL (ref 70–99)
Potassium: 3.2 mEq/L — ABNORMAL LOW (ref 3.7–5.3)
SODIUM: 140 meq/L (ref 137–147)

## 2014-06-11 LAB — MAGNESIUM: MAGNESIUM: 2 mg/dL (ref 1.5–2.5)

## 2014-06-11 MED ORDER — POTASSIUM CHLORIDE CRYS ER 20 MEQ PO TBCR
40.0000 meq | EXTENDED_RELEASE_TABLET | Freq: Two times a day (BID) | ORAL | Status: AC
  Administered 2014-06-11 (×2): 40 meq via ORAL
  Filled 2014-06-11 (×2): qty 2

## 2014-06-11 NOTE — Progress Notes (Signed)
[facility] Progress Note  4 Days Post-Op  Subjective: Pt doing fantastic.  No N/V.  Rectal bleeding reducing.  Blood less and no longer BRBPR.  Pt having good BM's and flatus.  Tolerating solid food.  No pain.  Ambulating well.  Objective: Vital signs in last 24 hours: Temp:  [97.8 F (36.6 C)-98.8 F (37.1 C)] 98.2 F (36.8 C) (09/15 0509) Pulse Rate:  [45-91] 59 (09/15 0509) Resp:  [17-28] 17 (09/15 0509) BP: (107-125)/(57-70) 124/70 mmHg (09/15 0509) SpO2:  [96 %-99 %] 96 % (09/15 0509) Weight:  [255 lb 4.7 oz (115.8 kg)] 255 lb 4.7 oz (115.8 kg) (09/15 0509) Last BM Date: 06/10/14  Intake/Output from previous day: 09/14 0701 - 09/15 0700 In: 1320 [P.O.:1320] Out: 752 [Urine:750; Stool:2] Intake/Output this shift:    PE: Gen: Alert, NAD, pleasant  Abd: Soft, NT/ND, +BS, no HSM   Lab Results:   Recent Labs  06/10/14 0439 06/11/14 0344  WBC 7.0 6.2  HGB 8.1* 8.1*  HCT 24.6* 24.0*  PLT 150 168   BMET  Recent Labs  06/09/14 0245 06/11/14 0344  NA 141 140  K 4.0 3.2*  CL 108 106  CO2 27 24  GLUCOSE 92 97  BUN 30* 14  CREATININE 0.94 0.91  CALCIUM 7.3* 7.4*   PT/INR  Recent Labs  06/08/14 0801  LABPROT 16.8*  INR 1.36   CMP     Component Value Date/Time   NA 140 06/11/2014 0344   K 3.2* 06/11/2014 0344   CL 106 06/11/2014 0344   CO2 24 06/11/2014 0344   GLUCOSE 97 06/11/2014 0344   BUN 14 06/11/2014 0344   CREATININE 0.91 06/11/2014 0344   CALCIUM 7.4* 06/11/2014 0344   PROT 4.3* 06/08/2014 0801   ALBUMIN 2.3* 06/08/2014 0801   AST 16 06/08/2014 0801   ALT 34 06/08/2014 0801   ALKPHOS 34* 06/08/2014 0801   BILITOT 1.0 06/08/2014 0801   GFRNONAA 86* 06/11/2014 0344   GFRAA >90 06/11/2014 0344   Lipase  No results found for this basename: lipase       Studies/Results: No results found.  Anti-infectives: Anti-infectives   Start     Dose/Rate Route Frequency Ordered Stop   06/07/14 2256  ceFAZolin (ANCEF) powder      Other As  needed 06/07/14 2335 06/07/14 2256   06/07/14 2243  ceFAZolin (ANCEF) 2-3 GM-% IVPB SOLR    Comments:  Barley, Jenny   : cabinet override      06/07/14 2243 06/08/14 1059       Assessment/Plan UGI bleeding s/p IR embolization of GDA and right gastric artery  Hypotension  Clot in pylorus per Dr Elnoria Howard endoscopy report  Need for anticoagulation for AFIB - On Eloquis last dose 06/06/14 by report  HTN   Plan:  1. Hgb pretty stable at 8.1 two days in a row 2. Hold Eloquis for now  3. On HH diet 4. Ambulate and IS  5. Not currently on DVT proph due to GI bleed  6. SCD's  7. No surgical interventions needed, continue maximal medical management including protonix, pt says GI will repeat EGD prior to d/c 8. No other recommendations, will sign off, please call with questions/concerns, should not need surgical follow up.     LOS: 9 days    DORT, Aundra Millet 06/11/2014, 7:52 AM Pager: [phone]

## 2014-06-11 NOTE — Progress Notes (Signed)
          Daily Rounding Note  06/11/2014, 8:34 AM  LOS: 9 days   SUBJECTIVE:       Stools still dark and maroon in appearance.  Abut 3 yesterday, 1 today.  Tolerating solids. Walking in hall, not dizzy.   OBJECTIVE:         Vital signs in last 24 hours:    Temp:  [97.8 F (36.6 C)-98.8 F (37.1 C)] 98.2 F (36.8 C) (09/15 0509) Pulse Rate:  [45-91] 59 (09/15 0509) Resp:  [17-28] 17 (09/15 0509) BP: (107-125)/(57-70) 124/70 mmHg (09/15 0509) SpO2:  [96 %-99 %] 96 % (09/15 0509) Weight:  [115.8 kg (255 lb 4.7 oz)] 115.8 kg (255 lb 4.7 oz) (09/15 0509) Last BM Date: 06/10/14 General: looks well.  comfortable   Heart: irreg irreg.   Chest: clear bil .  No labored breathing Abdomen: soft, NT, ND.  BS hypoactive.   Extremities: no CCE.  Neuro/Psych:  Pleasant, loquacious, oriented x 3. No gross deficits.   Intake/Output from previous day: 09/14 0701 - 09/15 0700 In: 1320 [P.O.:1320] Out: 752 [Urine:750; Stool:2]  Intake/Output this shift:    Lab Results:  Recent Labs  06/09/14 0245 06/10/14 0439 06/11/14 0344  WBC 8.2 7.0 6.2  HGB 8.3* 8.1* 8.1*  HCT 26.0* 24.6* 24.0*  PLT 141* 150 168   BMET  Recent Labs  06/09/14 0245 06/11/14 0344  NA 141 140  K 4.0 3.2*  CL 108 106  CO2 27 24  GLUCOSE 92 97  BUN 30* 14  CREATININE 0.94 0.91  CALCIUM 7.3* 7.4*   Scheduled Meds: . amiodarone  400 mg Oral BID  . pantoprazole (PROTONIX) IV  40 mg Intravenous Q12H  . potassium chloride  40 mEq Oral BID   Continuous Infusions:  PRN Meds:.ALPRAZolam, ELETONE, ondansetron (ZOFRAN) IV, promethazine   ASSESMENT:   * Hematemesis. Melena.  9/11 EGD: ulcer and pyloric channel vs duodenum. Large adherent clot.  S/p 9/11 -9/12  1.) Placement right IJ triple lumen CVC. Tip at superior CAJ and ready for use.  2.) Mesenteric angiogram  3.) Coil embolization of GDA and right gastric (pyloric) artery  On IV, BID Protonix.    * ABL anemia. No transfusions to date. Hgb down about 3 grams overall since 9/11 bleed. Hgb stable  * Rapid Afib. Maintaining SR after TEE.  Eliquis discontinued on 9/11, last dose 9/10.  Tikosyn dose reduced 9/13 due to prolonged QTc.   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      PLAN   *  EGD set for ~ 1430 tomorrow. Will allow clears for breakfast. Leave on IV Protonix BID for now.     Bryan Moran  06/11/2014, 8:34 AM Pager: [phone]  Agree with Ms. Heron Nay assessment and plan. Note that EGD may be later than 1430 Iva Boop, MD, [facility]

## 2014-06-11 NOTE — Progress Notes (Addendum)
Would hold eliquis as long as comfortable, no more bleeding,gi plan for repeat endoscopy, call back if any issues for [place], he is not actively bleeding anymore

## 2014-06-11 NOTE — Progress Notes (Signed)
Patient ID: Bryan Moran, male   DOB: Apr 05, 1946, 68 y.o.   MRN: [id number]   Patient Name: Bryan Moran Date of Encounter: 06/11/2014     Active Problems:   Persistent atrial fibrillation   Atrial fibrillation   Hematemesis   Coagulopathy   Acute gastric ulcer with hemorrhage   Acute blood loss anemia    SUBJECTIVE  "I feel ok", denies chest pain or sob.  CURRENT MEDS . amiodarone  400 mg Oral BID  . pantoprazole (PROTONIX) IV  40 mg Intravenous Q12H  . potassium chloride  40 mEq Oral BID    OBJECTIVE  Filed Vitals:   06/10/14 1445 06/10/14 1845 06/10/14 2106 06/11/14 0509  BP: 113/66 107/57 125/60 124/70  Pulse: 91 45 54 59  Temp: 97.8 F (36.6 C) 98.8 F (37.1 C) 98.6 F (37 C) 98.2 F (36.8 C)  TempSrc: Oral Oral Oral Oral  Resp: Height:      Weight:    255 lb 4.7 oz (115.8 kg)  SpO2: 99% 97% 98% 96%    Intake/Output Summary (Last 24 hours) at 06/11/14 0807 Last data filed at 06/11/14 7829  Gross per 24 hour  Intake   1320 ml  Output    552 ml  Net    768 ml   Filed Weights   06/09/14 1158 06/10/14 0703 06/11/14 0509  Weight: 254 lb 13.6 oz (115.6 kg) 254 lb 13.6 oz (115.6 kg) 255 lb 4.7 oz (115.8 kg)    PHYSICAL EXAM  General: Pleasant, 68 yo man NAD. Neuro: Alert and oriented X 3. Moves all extremities spontaneously. HEENT:  Normal  Neck: Supple without bruits or JVD. Lungs:  Resp regular and unlabored, CTA. Heart: IRRR no s3, s4, or murmurs. Abdomen: Soft, non-tender, non-distended, BS + x 4.  Extremities: No clubbing, cyanosis or edema. DP/PT/Radials 2+ and equal bilaterally.  Accessory Clinical Findings  CBC  Recent Labs  06/09/14 0245 06/10/14 0439 06/11/14 0344  WBC 8.2 7.0 6.2  NEUTROABS 6.3 4.8  --   HGB 8.3* 8.1* 8.1*  HCT 26.0* 24.6* 24.0*  MCV 97.7 95.3 94.5  PLT 141* 150 168   Basic Metabolic Panel  Recent Labs  06/09/14 0245 06/11/14 0344  NA 141 140  K 4.0 3.2*  CL 108 106  CO2 27 24  GLUCOSE 92 97   BUN 30* 14  CREATININE 0.94 0.91  CALCIUM 7.3* 7.4*  MG  --  2.0   Liver Function Tests No results found for this basename: AST, ALT, ALKPHOS, BILITOT, PROT, ALBUMIN,  in the last 72 hours No results found for this basename: LIPASE, AMYLASE,  in the last 72 hours Cardiac Enzymes  Recent Labs  06/08/14 1320  TROPONINI <0.30   BNP No components found with this basename: POCBNP,  D-Dimer No results found for this basename: DDIMER,  in the last 72 hours Hemoglobin A1C No results found for this basename: HGBA1C,  in the last 72 hours Fasting Lipid Panel No results found for this basename: CHOL, HDL, LDLCALC, TRIG, CHOLHDL, LDLDIRECT,  in the last 72 hours Thyroid Function Tests No results found for this basename: TSH, T4TOTAL, FREET3, T3FREE, THYROIDAB,  in the last 72 hours  TELE  Atrial fib/nsr/nsvt  Radiology/Studies  Ir Angiogram Visceral Selective  06/10/2014   CLINICAL DATA:  68 year old male with severe upper GI bleed requiring transfusion. Patient had clot with active bleeding upon manipulation during endoscopy in the region of the pylorus/ proximal duodenum. Patient requires  a both central venous access for pressor support and large volume transfusion as well as mesenteric angiogram with possible embolization.  EXAM: SELECTIVE VISCERAL ARTERIOGRAPHY; IR RIGHT FLOURO GUIDE CV LINE; IR ULTRASOUND GUIDANCE VASC ACCESS RIGHT; ADDITIONAL ARTERIOGRAPHY; TRANSCATHETER THERAPY EMBOLIZATION  Date: 06/10/2014  PROCEDURE: 1. Ultrasound-guided puncture of the right internal jugular vein 2. Placement of a triple-lumen central venous catheter under fluoroscopic guidance 3. Ultrasound guided puncture of the right common femoral artery 4. Catheterization of the celiac artery with arteriogram 5. Catheterization of the gastroduodenal artery with arteriogram 6. Coil embolization of the gastroduodenal artery. 7. Post embolization arteriogram. 8. Catheterization of the proper hepatic artery with  arteriogram 9. Catheterization of the right gastric artery with arteriogram 10. Coil embolization of the right gastric artery 11. Post embolization arteriogram 12. Limited common femoral arteriogram Interventional Radiologist:  Sterling Big, MD  ANESTHESIA/SEDATION: Moderate (conscious) sedation was used. One mg Versed, 50 mcg Fentanyl were administered intravenously. The patient's vital signs were monitored continuously by radiology nursing throughout the procedure.  Sedation Time: 80 minutes  FLUOROSCOPY TIME:  17 min 6 seconds  CONTRAST:  85mL OMNIPAQUE IOHEXOL 300 MG/ML  SOLN  TECHNIQUE: Informed consent was obtained from the patient following explanation of the procedure, risks, benefits and alternatives. The patient understands, agrees and consents for the procedure. All questions were addressed. A time out was performed.  Maximal barrier sterile technique utilized including caps, mask, sterile gowns, sterile gloves, large sterile drape, hand hygiene, and chlorhexidine skin prep.  The right neck was first interrogated with ultrasound. The internal jugular vein is widely patent and compressible. An image was obtained and stored for the medical record. Local anesthesia was attained by infiltration with 1% lidocaine. A small dermatotomy was made. Under direct sonographic guidance, the right internal jugular vein was punctured with an 18 G needle. A wire was advanced into the inferior vena cava under fluoroscopic guidance. An Arrow triple-lumen central venous catheter was then advanced over the wire and positioned with the tip at the superior cavoatrial junction. The location was confirmed with a fluoroscopic spot image. The wire was removed. The catheter was flushed and secured to the skin with nylon suture. A sterile bandage was applied.  Attention was next turned to the right groin. The right groin was interrogated with ultrasound. The common femoral artery is widely patent. An image was obtained and  stored for the medical record. Local anesthesia was attained by infiltration with 1% lidocaine. A small dermatotomy was made. Under real-time sonographic guidance, the vessel was punctured with a 21 gauge micropuncture needle. An image was obtained and stored. With the AV transitional 5 French micro sheath the 0.018 inch wire was exchanged for a 0.035 inch wire. A working 5 [place] vascular sheath was advanced over the wire. An initial limited right common femoral arteriogram was performed confirming puncture at the inferior third of the femoral head well above the bifurcation.  A C2 cobra catheter was then advanced over the wire and used to select the celiac artery. A celiac arteriogram was performed. Conventional hepatic arterial anatomy. No evidence of active bleeding. The vessels are slightly attenuated consistent with concurrent use of vasopressin. Attempts were made to navigate the 5 French catheter into the common hepatic artery, however this was not successful. Therefore, and angled Direxion catheter was advanced over a fathom 16 wire and used to select the gastroduodenal artery. An arteriogram was performed. There is no evidence of active bleeding. Multiple small branches to the proximal duodenum are  identified. The vessel was then coil embolized using a combination of 5 and 4 mm Interlock detachable micro coils. Following placement of the final micro coil a post embolization arteriogram confirms stasis within the gastroduodenal artery.  The micro catheter was advanced into the proper hepatic artery. A proper hepatic arteriogram was performed. There is a mildly prominent right gastric artery arising from the proximal left hepatic artery. The micro catheter was navigated into the right gastric artery and an arteriogram was performed. No active bleeding. Branches to the pylorus are identified. The vessel was coil embolized using a combination of 2 mm and 2 x 3 mm Interlock detachable micro coils. Post  embolization arteriogram confirmed complete occlusion of the vessel.  The micro catheter was removed. The cobra catheter was used to select the superior mesenteric artery. The superior mesenteric arteriogram was performed. There is adequate filling of the gastroepiploic artery via the pancreaticoduodenal arcade. No evidence of active hemorrhage. The 5 French catheter was removed. Arterial hemostasis was attained with the aid of the Cordis ExoSeal extra arterial vascular plug. The patient tolerated the procedure well.  COMPLICATIONS: None immediate  IMPRESSION: 1. Placement of a triple-lumen central venous catheter via a right internal jugular venous approach. The catheter tip is at the superior cavoatrial junction and is ready for immediate use. 2. Multivessel mesenteric arteriogram does not show any active hemorrhage. 3. Prophylactic coil embolization of the gastroduodenal and right gastric arteries to decrease arterial flow to the pylorus and proximal duodenum. Signed,  Sterling Big, MD  [facility]  [facility] Radiology   Electronically Signed   By: Malachy Moan M.D.   On: 06/10/2014 14:55   Ir Angiogram Visceral Selective  06/10/2014   CLINICAL DATA:  68 year old male with severe upper GI bleed requiring transfusion. Patient had clot with active bleeding upon manipulation during endoscopy in the region of the pylorus/ proximal duodenum. Patient requires a both central venous access for pressor support and large volume transfusion as well as mesenteric angiogram with possible embolization.  EXAM: SELECTIVE VISCERAL ARTERIOGRAPHY; IR RIGHT FLOURO GUIDE CV LINE; IR ULTRASOUND GUIDANCE VASC ACCESS RIGHT; ADDITIONAL ARTERIOGRAPHY; TRANSCATHETER THERAPY EMBOLIZATION  Date: 06/10/2014  PROCEDURE: 1. Ultrasound-guided puncture of the right internal jugular vein 2. Placement of a triple-lumen central venous catheter under fluoroscopic guidance 3. Ultrasound guided  puncture of the right common femoral artery 4. Catheterization of the celiac artery with arteriogram 5. Catheterization of the gastroduodenal artery with arteriogram 6. Coil embolization of the gastroduodenal artery. 7. Post embolization arteriogram. 8. Catheterization of the proper hepatic artery with arteriogram 9. Catheterization of the right gastric artery with arteriogram 10. Coil embolization of the right gastric artery 11. Post embolization arteriogram 12. Limited common femoral arteriogram Interventional Radiologist:  Sterling Big, MD  ANESTHESIA/SEDATION: Moderate (conscious) sedation was used. One mg Versed, 50 mcg Fentanyl were administered intravenously. The patient's vital signs were monitored continuously by radiology nursing throughout the procedure.  Sedation Time: 80 minutes  FLUOROSCOPY TIME:  17 min 6 seconds  CONTRAST:  85mL OMNIPAQUE IOHEXOL 300 MG/ML  SOLN  TECHNIQUE: Informed consent was obtained from the patient following explanation of the procedure, risks, benefits and alternatives. The patient understands, agrees and consents for the procedure. All questions were addressed. A time out was performed.  Maximal barrier sterile technique utilized including caps, mask, sterile gowns, sterile gloves, large sterile drape, hand hygiene, and chlorhexidine skin prep.  The right neck was first interrogated with ultrasound. The internal jugular vein is  widely patent and compressible. An image was obtained and stored for the medical record. Local anesthesia was attained by infiltration with 1% lidocaine. A small dermatotomy was made. Under direct sonographic guidance, the right internal jugular vein was punctured with an 18 G needle. A wire was advanced into the inferior vena cava under fluoroscopic guidance. An Arrow triple-lumen central venous catheter was then advanced over the wire and positioned with the tip at the superior cavoatrial junction. The location was confirmed with a fluoroscopic  spot image. The wire was removed. The catheter was flushed and secured to the skin with nylon suture. A sterile bandage was applied.  Attention was next turned to the right groin. The right groin was interrogated with ultrasound. The common femoral artery is widely patent. An image was obtained and stored for the medical record. Local anesthesia was attained by infiltration with 1% lidocaine. A small dermatotomy was made. Under real-time sonographic guidance, the vessel was punctured with a 21 gauge micropuncture needle. An image was obtained and stored. With the AV transitional 5 French micro sheath the 0.018 inch wire was exchanged for a 0.035 inch wire. A working 5 [place] vascular sheath was advanced over the wire. An initial limited right common femoral arteriogram was performed confirming puncture at the inferior third of the femoral head well above the bifurcation.  A C2 cobra catheter was then advanced over the wire and used to select the celiac artery. A celiac arteriogram was performed. Conventional hepatic arterial anatomy. No evidence of active bleeding. The vessels are slightly attenuated consistent with concurrent use of vasopressin. Attempts were made to navigate the 5 French catheter into the common hepatic artery, however this was not successful. Therefore, and angled Direxion catheter was advanced over a fathom 16 wire and used to select the gastroduodenal artery. An arteriogram was performed. There is no evidence of active bleeding. Multiple small branches to the proximal duodenum are identified. The vessel was then coil embolized using a combination of 5 and 4 mm Interlock detachable micro coils. Following placement of the final micro coil a post embolization arteriogram confirms stasis within the gastroduodenal artery.  The micro catheter was advanced into the proper hepatic artery. A proper hepatic arteriogram was performed. There is a mildly prominent right gastric artery arising from the  proximal left hepatic artery. The micro catheter was navigated into the right gastric artery and an arteriogram was performed. No active bleeding. Branches to the pylorus are identified. The vessel was coil embolized using a combination of 2 mm and 2 x 3 mm Interlock detachable micro coils. Post embolization arteriogram confirmed complete occlusion of the vessel.  The micro catheter was removed. The cobra catheter was used to select the superior mesenteric artery. The superior mesenteric arteriogram was performed. There is adequate filling of the gastroepiploic artery via the pancreaticoduodenal arcade. No evidence of active hemorrhage. The 5 French catheter was removed. Arterial hemostasis was attained with the aid of the Cordis ExoSeal extra arterial vascular plug. The patient tolerated the procedure well.  COMPLICATIONS: None immediate  IMPRESSION: 1. Placement of a triple-lumen central venous catheter via a right internal jugular venous approach. The catheter tip is at the superior cavoatrial junction and is ready for immediate use. 2. Multivessel mesenteric arteriogram does not show any active hemorrhage. 3. Prophylactic coil embolization of the gastroduodenal and right gastric arteries to decrease arterial flow to the pylorus and proximal duodenum. Signed,  Sterling Big, MD  [facility]  [facility] Radiology  Electronically Signed   By: Malachy Moan M.D.   On: 06/10/2014 14:55   Ir Angiogram Selective Each Additional Vessel  06/10/2014   CLINICAL DATA:  68 year old male with severe upper GI bleed requiring transfusion. Patient had clot with active bleeding upon manipulation during endoscopy in the region of the pylorus/ proximal duodenum. Patient requires a both central venous access for pressor support and large volume transfusion as well as mesenteric angiogram with possible embolization.  EXAM: SELECTIVE VISCERAL ARTERIOGRAPHY; IR RIGHT FLOURO GUIDE CV  LINE; IR ULTRASOUND GUIDANCE VASC ACCESS RIGHT; ADDITIONAL ARTERIOGRAPHY; TRANSCATHETER THERAPY EMBOLIZATION  Date: 06/10/2014  PROCEDURE: 1. Ultrasound-guided puncture of the right internal jugular vein 2. Placement of a triple-lumen central venous catheter under fluoroscopic guidance 3. Ultrasound guided puncture of the right common femoral artery 4. Catheterization of the celiac artery with arteriogram 5. Catheterization of the gastroduodenal artery with arteriogram 6. Coil embolization of the gastroduodenal artery. 7. Post embolization arteriogram. 8. Catheterization of the proper hepatic artery with arteriogram 9. Catheterization of the right gastric artery with arteriogram 10. Coil embolization of the right gastric artery 11. Post embolization arteriogram 12. Limited common femoral arteriogram Interventional Radiologist:  Sterling Big, MD  ANESTHESIA/SEDATION: Moderate (conscious) sedation was used. One mg Versed, 50 mcg Fentanyl were administered intravenously. The patient's vital signs were monitored continuously by radiology nursing throughout the procedure.  Sedation Time: 80 minutes  FLUOROSCOPY TIME:  17 min 6 seconds  CONTRAST:  71mL OMNIPAQUE IOHEXOL 300 MG/ML  SOLN  TECHNIQUE: Informed consent was obtained from the patient following explanation of the procedure, risks, benefits and alternatives. The patient understands, agrees and consents for the procedure. All questions were addressed. A time out was performed.  Maximal barrier sterile technique utilized including caps, mask, sterile gowns, sterile gloves, large sterile drape, hand hygiene, and chlorhexidine skin prep.  The right neck was first interrogated with ultrasound. The internal jugular vein is widely patent and compressible. An image was obtained and stored for the medical record. Local anesthesia was attained by infiltration with 1% lidocaine. A small dermatotomy was made. Under direct sonographic guidance, the right internal jugular  vein was punctured with an 18 G needle. A wire was advanced into the inferior vena cava under fluoroscopic guidance. An Arrow triple-lumen central venous catheter was then advanced over the wire and positioned with the tip at the superior cavoatrial junction. The location was confirmed with a fluoroscopic spot image. The wire was removed. The catheter was flushed and secured to the skin with nylon suture. A sterile bandage was applied.  Attention was next turned to the right groin. The right groin was interrogated with ultrasound. The common femoral artery is widely patent. An image was obtained and stored for the medical record. Local anesthesia was attained by infiltration with 1% lidocaine. A small dermatotomy was made. Under real-time sonographic guidance, the vessel was punctured with a 21 gauge micropuncture needle. An image was obtained and stored. With the AV transitional 5 French micro sheath the 0.018 inch wire was exchanged for a 0.035 inch wire. A working 5 [place] vascular sheath was advanced over the wire. An initial limited right common femoral arteriogram was performed confirming puncture at the inferior third of the femoral head well above the bifurcation.  A C2 cobra catheter was then advanced over the wire and used to select the celiac artery. A celiac arteriogram was performed. Conventional hepatic arterial anatomy. No evidence of active bleeding. The vessels are slightly attenuated consistent with concurrent use of  vasopressin. Attempts were made to navigate the 5 French catheter into the common hepatic artery, however this was not successful. Therefore, and angled Direxion catheter was advanced over a fathom 16 wire and used to select the gastroduodenal artery. An arteriogram was performed. There is no evidence of active bleeding. Multiple small branches to the proximal duodenum are identified. The vessel was then coil embolized using a combination of 5 and 4 mm Interlock detachable micro coils.  Following placement of the final micro coil a post embolization arteriogram confirms stasis within the gastroduodenal artery.  The micro catheter was advanced into the proper hepatic artery. A proper hepatic arteriogram was performed. There is a mildly prominent right gastric artery arising from the proximal left hepatic artery. The micro catheter was navigated into the right gastric artery and an arteriogram was performed. No active bleeding. Branches to the pylorus are identified. The vessel was coil embolized using a combination of 2 mm and 2 x 3 mm Interlock detachable micro coils. Post embolization arteriogram confirmed complete occlusion of the vessel.  The micro catheter was removed. The cobra catheter was used to select the superior mesenteric artery. The superior mesenteric arteriogram was performed. There is adequate filling of the gastroepiploic artery via the pancreaticoduodenal arcade. No evidence of active hemorrhage. The 5 French catheter was removed. Arterial hemostasis was attained with the aid of the Cordis ExoSeal extra arterial vascular plug. The patient tolerated the procedure well.  COMPLICATIONS: None immediate  IMPRESSION: 1. Placement of a triple-lumen central venous catheter via a right internal jugular venous approach. The catheter tip is at the superior cavoatrial junction and is ready for immediate use. 2. Multivessel mesenteric arteriogram does not show any active hemorrhage. 3. Prophylactic coil embolization of the gastroduodenal and right gastric arteries to decrease arterial flow to the pylorus and proximal duodenum. Signed,  Sterling Big, MD  [facility]  [facility] Radiology   Electronically Signed   By: Malachy Moan M.D.   On: 06/10/2014 14:55   Ir Angiogram Selective Each Additional Vessel  06/10/2014   CLINICAL DATA:  68 year old male with severe upper GI bleed requiring transfusion. Patient had clot with active bleeding upon  manipulation during endoscopy in the region of the pylorus/ proximal duodenum. Patient requires a both central venous access for pressor support and large volume transfusion as well as mesenteric angiogram with possible embolization.  EXAM: SELECTIVE VISCERAL ARTERIOGRAPHY; IR RIGHT FLOURO GUIDE CV LINE; IR ULTRASOUND GUIDANCE VASC ACCESS RIGHT; ADDITIONAL ARTERIOGRAPHY; TRANSCATHETER THERAPY EMBOLIZATION  Date: 06/10/2014  PROCEDURE: 1. Ultrasound-guided puncture of the right internal jugular vein 2. Placement of a triple-lumen central venous catheter under fluoroscopic guidance 3. Ultrasound guided puncture of the right common femoral artery 4. Catheterization of the celiac artery with arteriogram 5. Catheterization of the gastroduodenal artery with arteriogram 6. Coil embolization of the gastroduodenal artery. 7. Post embolization arteriogram. 8. Catheterization of the proper hepatic artery with arteriogram 9. Catheterization of the right gastric artery with arteriogram 10. Coil embolization of the right gastric artery 11. Post embolization arteriogram 12. Limited common femoral arteriogram Interventional Radiologist:  Sterling Big, MD  ANESTHESIA/SEDATION: Moderate (conscious) sedation was used. One mg Versed, 50 mcg Fentanyl were administered intravenously. The patient's vital signs were monitored continuously by radiology nursing throughout the procedure.  Sedation Time: 80 minutes  FLUOROSCOPY TIME:  17 min 6 seconds  CONTRAST:  85mL OMNIPAQUE IOHEXOL 300 MG/ML  SOLN  TECHNIQUE: Informed consent was obtained from the patient following explanation of  the procedure, risks, benefits and alternatives. The patient understands, agrees and consents for the procedure. All questions were addressed. A time out was performed.  Maximal barrier sterile technique utilized including caps, mask, sterile gowns, sterile gloves, large sterile drape, hand hygiene, and chlorhexidine skin prep.  The right neck was first  interrogated with ultrasound. The internal jugular vein is widely patent and compressible. An image was obtained and stored for the medical record. Local anesthesia was attained by infiltration with 1% lidocaine. A small dermatotomy was made. Under direct sonographic guidance, the right internal jugular vein was punctured with an 18 G needle. A wire was advanced into the inferior vena cava under fluoroscopic guidance. An Arrow triple-lumen central venous catheter was then advanced over the wire and positioned with the tip at the superior cavoatrial junction. The location was confirmed with a fluoroscopic spot image. The wire was removed. The catheter was flushed and secured to the skin with nylon suture. A sterile bandage was applied.  Attention was next turned to the right groin. The right groin was interrogated with ultrasound. The common femoral artery is widely patent. An image was obtained and stored for the medical record. Local anesthesia was attained by infiltration with 1% lidocaine. A small dermatotomy was made. Under real-time sonographic guidance, the vessel was punctured with a 21 gauge micropuncture needle. An image was obtained and stored. With the AV transitional 5 French micro sheath the 0.018 inch wire was exchanged for a 0.035 inch wire. A working 5 [place] vascular sheath was advanced over the wire. An initial limited right common femoral arteriogram was performed confirming puncture at the inferior third of the femoral head well above the bifurcation.  A C2 cobra catheter was then advanced over the wire and used to select the celiac artery. A celiac arteriogram was performed. Conventional hepatic arterial anatomy. No evidence of active bleeding. The vessels are slightly attenuated consistent with concurrent use of vasopressin. Attempts were made to navigate the 5 French catheter into the common hepatic artery, however this was not successful. Therefore, and angled Direxion catheter was advanced  over a fathom 16 wire and used to select the gastroduodenal artery. An arteriogram was performed. There is no evidence of active bleeding. Multiple small branches to the proximal duodenum are identified. The vessel was then coil embolized using a combination of 5 and 4 mm Interlock detachable micro coils. Following placement of the final micro coil a post embolization arteriogram confirms stasis within the gastroduodenal artery.  The micro catheter was advanced into the proper hepatic artery. A proper hepatic arteriogram was performed. There is a mildly prominent right gastric artery arising from the proximal left hepatic artery. The micro catheter was navigated into the right gastric artery and an arteriogram was performed. No active bleeding. Branches to the pylorus are identified. The vessel was coil embolized using a combination of 2 mm and 2 x 3 mm Interlock detachable micro coils. Post embolization arteriogram confirmed complete occlusion of the vessel.  The micro catheter was removed. The cobra catheter was used to select the superior mesenteric artery. The superior mesenteric arteriogram was performed. There is adequate filling of the gastroepiploic artery via the pancreaticoduodenal arcade. No evidence of active hemorrhage. The 5 French catheter was removed. Arterial hemostasis was attained with the aid of the Cordis ExoSeal extra arterial vascular plug. The patient tolerated the procedure well.  COMPLICATIONS: None immediate  IMPRESSION: 1. Placement of a triple-lumen central venous catheter via a right internal jugular venous approach. The  catheter tip is at the superior cavoatrial junction and is ready for immediate use. 2. Multivessel mesenteric arteriogram does not show any active hemorrhage. 3. Prophylactic coil embolization of the gastroduodenal and right gastric arteries to decrease arterial flow to the pylorus and proximal duodenum. Signed,  Sterling Big, MD  [facility]  [facility] Radiology   Electronically Signed   By: Malachy Moan M.D.   On: 06/10/2014 14:55   Ir Transcath/emboliz  06/10/2014   CLINICAL DATA:  68 year old male with severe upper GI bleed requiring transfusion. Patient had clot with active bleeding upon manipulation during endoscopy in the region of the pylorus/ proximal duodenum. Patient requires a both central venous access for pressor support and large volume transfusion as well as mesenteric angiogram with possible embolization.  EXAM: SELECTIVE VISCERAL ARTERIOGRAPHY; IR RIGHT FLOURO GUIDE CV LINE; IR ULTRASOUND GUIDANCE VASC ACCESS RIGHT; ADDITIONAL ARTERIOGRAPHY; TRANSCATHETER THERAPY EMBOLIZATION  Date: 06/10/2014  PROCEDURE: 1. Ultrasound-guided puncture of the right internal jugular vein 2. Placement of a triple-lumen central venous catheter under fluoroscopic guidance 3. Ultrasound guided puncture of the right common femoral artery 4. Catheterization of the celiac artery with arteriogram 5. Catheterization of the gastroduodenal artery with arteriogram 6. Coil embolization of the gastroduodenal artery. 7. Post embolization arteriogram. 8. Catheterization of the proper hepatic artery with arteriogram 9. Catheterization of the right gastric artery with arteriogram 10. Coil embolization of the right gastric artery 11. Post embolization arteriogram 12. Limited common femoral arteriogram Interventional Radiologist:  Sterling Big, MD  ANESTHESIA/SEDATION: Moderate (conscious) sedation was used. One mg Versed, 50 mcg Fentanyl were administered intravenously. The patient's vital signs were monitored continuously by radiology nursing throughout the procedure.  Sedation Time: 80 minutes  FLUOROSCOPY TIME:  17 min 6 seconds  CONTRAST:  85mL OMNIPAQUE IOHEXOL 300 MG/ML  SOLN  TECHNIQUE: Informed consent was obtained from the patient following explanation of the procedure, risks, benefits and alternatives. The patient understands,  agrees and consents for the procedure. All questions were addressed. A time out was performed.  Maximal barrier sterile technique utilized including caps, mask, sterile gowns, sterile gloves, large sterile drape, hand hygiene, and chlorhexidine skin prep.  The right neck was first interrogated with ultrasound. The internal jugular vein is widely patent and compressible. An image was obtained and stored for the medical record. Local anesthesia was attained by infiltration with 1% lidocaine. A small dermatotomy was made. Under direct sonographic guidance, the right internal jugular vein was punctured with an 18 G needle. A wire was advanced into the inferior vena cava under fluoroscopic guidance. An Arrow triple-lumen central venous catheter was then advanced over the wire and positioned with the tip at the superior cavoatrial junction. The location was confirmed with a fluoroscopic spot image. The wire was removed. The catheter was flushed and secured to the skin with nylon suture. A sterile bandage was applied.  Attention was next turned to the right groin. The right groin was interrogated with ultrasound. The common femoral artery is widely patent. An image was obtained and stored for the medical record. Local anesthesia was attained by infiltration with 1% lidocaine. A small dermatotomy was made. Under real-time sonographic guidance, the vessel was punctured with a 21 gauge micropuncture needle. An image was obtained and stored. With the AV transitional 5 French micro sheath the 0.018 inch wire was exchanged for a 0.035 inch wire. A working 5 [place] vascular sheath was advanced over the wire. An initial limited right common femoral arteriogram  was performed confirming puncture at the inferior third of the femoral head well above the bifurcation.  A C2 cobra catheter was then advanced over the wire and used to select the celiac artery. A celiac arteriogram was performed. Conventional hepatic arterial anatomy. No  evidence of active bleeding. The vessels are slightly attenuated consistent with concurrent use of vasopressin. Attempts were made to navigate the 5 French catheter into the common hepatic artery, however this was not successful. Therefore, and angled Direxion catheter was advanced over a fathom 16 wire and used to select the gastroduodenal artery. An arteriogram was performed. There is no evidence of active bleeding. Multiple small branches to the proximal duodenum are identified. The vessel was then coil embolized using a combination of 5 and 4 mm Interlock detachable micro coils. Following placement of the final micro coil a post embolization arteriogram confirms stasis within the gastroduodenal artery.  The micro catheter was advanced into the proper hepatic artery. A proper hepatic arteriogram was performed. There is a mildly prominent right gastric artery arising from the proximal left hepatic artery. The micro catheter was navigated into the right gastric artery and an arteriogram was performed. No active bleeding. Branches to the pylorus are identified. The vessel was coil embolized using a combination of 2 mm and 2 x 3 mm Interlock detachable micro coils. Post embolization arteriogram confirmed complete occlusion of the vessel.  The micro catheter was removed. The cobra catheter was used to select the superior mesenteric artery. The superior mesenteric arteriogram was performed. There is adequate filling of the gastroepiploic artery via the pancreaticoduodenal arcade. No evidence of active hemorrhage. The 5 French catheter was removed. Arterial hemostasis was attained with the aid of the Cordis ExoSeal extra arterial vascular plug. The patient tolerated the procedure well.  COMPLICATIONS: None immediate  IMPRESSION: 1. Placement of a triple-lumen central venous catheter via a right internal jugular venous approach. The catheter tip is at the superior cavoatrial junction and is ready for immediate use. 2.  Multivessel mesenteric arteriogram does not show any active hemorrhage. 3. Prophylactic coil embolization of the gastroduodenal and right gastric arteries to decrease arterial flow to the pylorus and proximal duodenum. Signed,  Sterling Big, MD  [facility]  [facility] Radiology   Electronically Signed   By: Malachy Moan M.D.   On: 06/10/2014 14:55   Ir Fluoro Guide Cv Line Right  06/10/2014   CLINICAL DATA:  68 year old male with severe upper GI bleed requiring transfusion. Patient had clot with active bleeding upon manipulation during endoscopy in the region of the pylorus/ proximal duodenum. Patient requires a both central venous access for pressor support and large volume transfusion as well as mesenteric angiogram with possible embolization.  EXAM: SELECTIVE VISCERAL ARTERIOGRAPHY; IR RIGHT FLOURO GUIDE CV LINE; IR ULTRASOUND GUIDANCE VASC ACCESS RIGHT; ADDITIONAL ARTERIOGRAPHY; TRANSCATHETER THERAPY EMBOLIZATION  Date: 06/10/2014  PROCEDURE: 1. Ultrasound-guided puncture of the right internal jugular vein 2. Placement of a triple-lumen central venous catheter under fluoroscopic guidance 3. Ultrasound guided puncture of the right common femoral artery 4. Catheterization of the celiac artery with arteriogram 5. Catheterization of the gastroduodenal artery with arteriogram 6. Coil embolization of the gastroduodenal artery. 7. Post embolization arteriogram. 8. Catheterization of the proper hepatic artery with arteriogram 9. Catheterization of the right gastric artery with arteriogram 10. Coil embolization of the right gastric artery 11. Post embolization arteriogram 12. Limited common femoral arteriogram Interventional Radiologist:  Sterling Big, MD  ANESTHESIA/SEDATION: Moderate (conscious) sedation was  used. One mg Versed, 50 mcg Fentanyl were administered intravenously. The patient's vital signs were monitored continuously by radiology nursing  throughout the procedure.  Sedation Time: 80 minutes  FLUOROSCOPY TIME:  17 min 6 seconds  CONTRAST:  85mL OMNIPAQUE IOHEXOL 300 MG/ML  SOLN  TECHNIQUE: Informed consent was obtained from the patient following explanation of the procedure, risks, benefits and alternatives. The patient understands, agrees and consents for the procedure. All questions were addressed. A time out was performed.  Maximal barrier sterile technique utilized including caps, mask, sterile gowns, sterile gloves, large sterile drape, hand hygiene, and chlorhexidine skin prep.  The right neck was first interrogated with ultrasound. The internal jugular vein is widely patent and compressible. An image was obtained and stored for the medical record. Local anesthesia was attained by infiltration with 1% lidocaine. A small dermatotomy was made. Under direct sonographic guidance, the right internal jugular vein was punctured with an 18 G needle. A wire was advanced into the inferior vena cava under fluoroscopic guidance. An Arrow triple-lumen central venous catheter was then advanced over the wire and positioned with the tip at the superior cavoatrial junction. The location was confirmed with a fluoroscopic spot image. The wire was removed. The catheter was flushed and secured to the skin with nylon suture. A sterile bandage was applied.  Attention was next turned to the right groin. The right groin was interrogated with ultrasound. The common femoral artery is widely patent. An image was obtained and stored for the medical record. Local anesthesia was attained by infiltration with 1% lidocaine. A small dermatotomy was made. Under real-time sonographic guidance, the vessel was punctured with a 21 gauge micropuncture needle. An image was obtained and stored. With the AV transitional 5 French micro sheath the 0.018 inch wire was exchanged for a 0.035 inch wire. A working 5 [place] vascular sheath was advanced over the wire. An initial limited right  common femoral arteriogram was performed confirming puncture at the inferior third of the femoral head well above the bifurcation.  A C2 cobra catheter was then advanced over the wire and used to select the celiac artery. A celiac arteriogram was performed. Conventional hepatic arterial anatomy. No evidence of active bleeding. The vessels are slightly attenuated consistent with concurrent use of vasopressin. Attempts were made to navigate the 5 French catheter into the common hepatic artery, however this was not successful. Therefore, and angled Direxion catheter was advanced over a fathom 16 wire and used to select the gastroduodenal artery. An arteriogram was performed. There is no evidence of active bleeding. Multiple small branches to the proximal duodenum are identified. The vessel was then coil embolized using a combination of 5 and 4 mm Interlock detachable micro coils. Following placement of the final micro coil a post embolization arteriogram confirms stasis within the gastroduodenal artery.  The micro catheter was advanced into the proper hepatic artery. A proper hepatic arteriogram was performed. There is a mildly prominent right gastric artery arising from the proximal left hepatic artery. The micro catheter was navigated into the right gastric artery and an arteriogram was performed. No active bleeding. Branches to the pylorus are identified. The vessel was coil embolized using a combination of 2 mm and 2 x 3 mm Interlock detachable micro coils. Post embolization arteriogram confirmed complete occlusion of the vessel.  The micro catheter was removed. The cobra catheter was used to select the superior mesenteric artery. The superior mesenteric arteriogram was performed. There is adequate filling of the gastroepiploic artery  via the pancreaticoduodenal arcade. No evidence of active hemorrhage. The 5 French catheter was removed. Arterial hemostasis was attained with the aid of the Cordis ExoSeal extra  arterial vascular plug. The patient tolerated the procedure well.  COMPLICATIONS: None immediate  IMPRESSION: 1. Placement of a triple-lumen central venous catheter via a right internal jugular venous approach. The catheter tip is at the superior cavoatrial junction and is ready for immediate use. 2. Multivessel mesenteric arteriogram does not show any active hemorrhage. 3. Prophylactic coil embolization of the gastroduodenal and right gastric arteries to decrease arterial flow to the pylorus and proximal duodenum. Signed,  Sterling Big, MD  [facility]  [facility] Radiology   Electronically Signed   By: Malachy Moan M.D.   On: 06/10/2014 14:55   Ir US Guide Vasc Access Right  06/10/2014   CLINICAL DATA:  68 year old male with severe upper GI bleed requiring transfusion. Patient had clot with active bleeding upon manipulation during endoscopy in the region of the pylorus/ proximal duodenum. Patient requires a both central venous access for pressor support and large volume transfusion as well as mesenteric angiogram with possible embolization.  EXAM: SELECTIVE VISCERAL ARTERIOGRAPHY; IR RIGHT FLOURO GUIDE CV LINE; IR ULTRASOUND GUIDANCE VASC ACCESS RIGHT; ADDITIONAL ARTERIOGRAPHY; TRANSCATHETER THERAPY EMBOLIZATION  Date: 06/10/2014  PROCEDURE: 1. Ultrasound-guided puncture of the right internal jugular vein 2. Placement of a triple-lumen central venous catheter under fluoroscopic guidance 3. Ultrasound guided puncture of the right common femoral artery 4. Catheterization of the celiac artery with arteriogram 5. Catheterization of the gastroduodenal artery with arteriogram 6. Coil embolization of the gastroduodenal artery. 7. Post embolization arteriogram. 8. Catheterization of the proper hepatic artery with arteriogram 9. Catheterization of the right gastric artery with arteriogram 10. Coil embolization of the right gastric artery 11. Post embolization arteriogram  12. Limited common femoral arteriogram Interventional Radiologist:  Sterling Big, MD  ANESTHESIA/SEDATION: Moderate (conscious) sedation was used. One mg Versed, 50 mcg Fentanyl were administered intravenously. The patient's vital signs were monitored continuously by radiology nursing throughout the procedure.  Sedation Time: 80 minutes  FLUOROSCOPY TIME:  17 min 6 seconds  CONTRAST:  85mL OMNIPAQUE IOHEXOL 300 MG/ML  SOLN  TECHNIQUE: Informed consent was obtained from the patient following explanation of the procedure, risks, benefits and alternatives. The patient understands, agrees and consents for the procedure. All questions were addressed. A time out was performed.  Maximal barrier sterile technique utilized including caps, mask, sterile gowns, sterile gloves, large sterile drape, hand hygiene, and chlorhexidine skin prep.  The right neck was first interrogated with ultrasound. The internal jugular vein is widely patent and compressible. An image was obtained and stored for the medical record. Local anesthesia was attained by infiltration with 1% lidocaine. A small dermatotomy was made. Under direct sonographic guidance, the right internal jugular vein was punctured with an 18 G needle. A wire was advanced into the inferior vena cava under fluoroscopic guidance. An Arrow triple-lumen central venous catheter was then advanced over the wire and positioned with the tip at the superior cavoatrial junction. The location was confirmed with a fluoroscopic spot image. The wire was removed. The catheter was flushed and secured to the skin with nylon suture. A sterile bandage was applied.  Attention was next turned to the right groin. The right groin was interrogated with ultrasound. The common femoral artery is widely patent. An image was obtained and stored for the medical record. Local anesthesia was attained by infiltration with 1% lidocaine.  A small dermatotomy was made. Under real-time sonographic guidance,  the vessel was punctured with a 21 gauge micropuncture needle. An image was obtained and stored. With the AV transitional 5 French micro sheath the 0.018 inch wire was exchanged for a 0.035 inch wire. A working 5 [place] vascular sheath was advanced over the wire. An initial limited right common femoral arteriogram was performed confirming puncture at the inferior third of the femoral head well above the bifurcation.  A C2 cobra catheter was then advanced over the wire and used to select the celiac artery. A celiac arteriogram was performed. Conventional hepatic arterial anatomy. No evidence of active bleeding. The vessels are slightly attenuated consistent with concurrent use of vasopressin. Attempts were made to navigate the 5 French catheter into the common hepatic artery, however this was not successful. Therefore, and angled Direxion catheter was advanced over a fathom 16 wire and used to select the gastroduodenal artery. An arteriogram was performed. There is no evidence of active bleeding. Multiple small branches to the proximal duodenum are identified. The vessel was then coil embolized using a combination of 5 and 4 mm Interlock detachable micro coils. Following placement of the final micro coil a post embolization arteriogram confirms stasis within the gastroduodenal artery.  The micro catheter was advanced into the proper hepatic artery. A proper hepatic arteriogram was performed. There is a mildly prominent right gastric artery arising from the proximal left hepatic artery. The micro catheter was navigated into the right gastric artery and an arteriogram was performed. No active bleeding. Branches to the pylorus are identified. The vessel was coil embolized using a combination of 2 mm and 2 x 3 mm Interlock detachable micro coils. Post embolization arteriogram confirmed complete occlusion of the vessel.  The micro catheter was removed. The cobra catheter was used to select the superior mesenteric artery.  The superior mesenteric arteriogram was performed. There is adequate filling of the gastroepiploic artery via the pancreaticoduodenal arcade. No evidence of active hemorrhage. The 5 French catheter was removed. Arterial hemostasis was attained with the aid of the Cordis ExoSeal extra arterial vascular plug. The patient tolerated the procedure well.  COMPLICATIONS: None immediate  IMPRESSION: 1. Placement of a triple-lumen central venous catheter via a right internal jugular venous approach. The catheter tip is at the superior cavoatrial junction and is ready for immediate use. 2. Multivessel mesenteric arteriogram does not show any active hemorrhage. 3. Prophylactic coil embolization of the gastroduodenal and right gastric arteries to decrease arterial flow to the pylorus and proximal duodenum. Signed,  Sterling Big, MD  [facility]  [facility] Radiology   Electronically Signed   By: Malachy Moan M.D.   On: 06/10/2014 14:55   Ir US Guide Vasc Access Right  06/10/2014   CLINICAL DATA:  68 year old male with severe upper GI bleed requiring transfusion. Patient had clot with active bleeding upon manipulation during endoscopy in the region of the pylorus/ proximal duodenum. Patient requires a both central venous access for pressor support and large volume transfusion as well as mesenteric angiogram with possible embolization.  EXAM: SELECTIVE VISCERAL ARTERIOGRAPHY; IR RIGHT FLOURO GUIDE CV LINE; IR ULTRASOUND GUIDANCE VASC ACCESS RIGHT; ADDITIONAL ARTERIOGRAPHY; TRANSCATHETER THERAPY EMBOLIZATION  Date: 06/10/2014  PROCEDURE: 1. Ultrasound-guided puncture of the right internal jugular vein 2. Placement of a triple-lumen central venous catheter under fluoroscopic guidance 3. Ultrasound guided puncture of the right common femoral artery 4. Catheterization of the celiac artery with arteriogram 5. Catheterization of the gastroduodenal  artery with arteriogram 6. Coil  embolization of the gastroduodenal artery. 7. Post embolization arteriogram. 8. Catheterization of the proper hepatic artery with arteriogram 9. Catheterization of the right gastric artery with arteriogram 10. Coil embolization of the right gastric artery 11. Post embolization arteriogram 12. Limited common femoral arteriogram Interventional Radiologist:  Sterling Big, MD  ANESTHESIA/SEDATION: Moderate (conscious) sedation was used. One mg Versed, 50 mcg Fentanyl were administered intravenously. The patient's vital signs were monitored continuously by radiology nursing throughout the procedure.  Sedation Time: 80 minutes  FLUOROSCOPY TIME:  17 min 6 seconds  CONTRAST:  85mL OMNIPAQUE IOHEXOL 300 MG/ML  SOLN  TECHNIQUE: Informed consent was obtained from the patient following explanation of the procedure, risks, benefits and alternatives. The patient understands, agrees and consents for the procedure. All questions were addressed. A time out was performed.  Maximal barrier sterile technique utilized including caps, mask, sterile gowns, sterile gloves, large sterile drape, hand hygiene, and chlorhexidine skin prep.  The right neck was first interrogated with ultrasound. The internal jugular vein is widely patent and compressible. An image was obtained and stored for the medical record. Local anesthesia was attained by infiltration with 1% lidocaine. A small dermatotomy was made. Under direct sonographic guidance, the right internal jugular vein was punctured with an 18 G needle. A wire was advanced into the inferior vena cava under fluoroscopic guidance. An Arrow triple-lumen central venous catheter was then advanced over the wire and positioned with the tip at the superior cavoatrial junction. The location was confirmed with a fluoroscopic spot image. The wire was removed. The catheter was flushed and secured to the skin with nylon suture. A sterile bandage was applied.  Attention was next turned to the right  groin. The right groin was interrogated with ultrasound. The common femoral artery is widely patent. An image was obtained and stored for the medical record. Local anesthesia was attained by infiltration with 1% lidocaine. A small dermatotomy was made. Under real-time sonographic guidance, the vessel was punctured with a 21 gauge micropuncture needle. An image was obtained and stored. With the AV transitional 5 French micro sheath the 0.018 inch wire was exchanged for a 0.035 inch wire. A working 5 [place] vascular sheath was advanced over the wire. An initial limited right common femoral arteriogram was performed confirming puncture at the inferior third of the femoral head well above the bifurcation.  A C2 cobra catheter was then advanced over the wire and used to select the celiac artery. A celiac arteriogram was performed. Conventional hepatic arterial anatomy. No evidence of active bleeding. The vessels are slightly attenuated consistent with concurrent use of vasopressin. Attempts were made to navigate the 5 French catheter into the common hepatic artery, however this was not successful. Therefore, and angled Direxion catheter was advanced over a fathom 16 wire and used to select the gastroduodenal artery. An arteriogram was performed. There is no evidence of active bleeding. Multiple small branches to the proximal duodenum are identified. The vessel was then coil embolized using a combination of 5 and 4 mm Interlock detachable micro coils. Following placement of the final micro coil a post embolization arteriogram confirms stasis within the gastroduodenal artery.  The micro catheter was advanced into the proper hepatic artery. A proper hepatic arteriogram was performed. There is a mildly prominent right gastric artery arising from the proximal left hepatic artery. The micro catheter was navigated into the right gastric artery and an arteriogram was performed. No active bleeding. Branches to the pylorus  are  identified. The vessel was coil embolized using a combination of 2 mm and 2 x 3 mm Interlock detachable micro coils. Post embolization arteriogram confirmed complete occlusion of the vessel.  The micro catheter was removed. The cobra catheter was used to select the superior mesenteric artery. The superior mesenteric arteriogram was performed. There is adequate filling of the gastroepiploic artery via the pancreaticoduodenal arcade. No evidence of active hemorrhage. The 5 French catheter was removed. Arterial hemostasis was attained with the aid of the Cordis ExoSeal extra arterial vascular plug. The patient tolerated the procedure well.  COMPLICATIONS: None immediate  IMPRESSION: 1. Placement of a triple-lumen central venous catheter via a right internal jugular venous approach. The catheter tip is at the superior cavoatrial junction and is ready for immediate use. 2. Multivessel mesenteric arteriogram does not show any active hemorrhage. 3. Prophylactic coil embolization of the gastroduodenal and right gastric arteries to decrease arterial flow to the pylorus and proximal duodenum. Signed,  Sterling Big, MD  [facility]  [facility]   Electronically Signed   By: Malachy Moan M.D.   On: 06/10/2014 14:55   Dg Chest Portable 1 View  06/02/2014   CLINICAL DATA:  Shortness of breath, fatigue, history of atrial fibrillation.  EXAM: PORTABLE CHEST - 1 VIEW  COMPARISON:  None.  FINDINGS: Markedly enlarged cardiac silhouette and mediastinal contours. Pulmonary venous congestion without frank evidence of edema. Bibasilar heterogeneous opacities, left greater than right. No definite pleural effusion, though note, the bilateral costophrenic angles are excluded from view. No pneumothorax. Unchanged bones.  IMPRESSION: Marked cardiomegaly and pulmonary venous congestion without frank evidence of edema. Further evaluation with a PA and lateral chest radiograph may be  obtained as clinically indicated.   Electronically Signed   By: Simonne Come M.D.   On: 06/02/2014 10:54    ASSESSMENT AND PLAN  1. Atrial fib with an RVR 2. 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 LBBB 4. GI bleed, s/p cauterization of a bleeding ulcer 5. Hypokalemia Rec: Appreciate Dr. Radene Journey followup. Agree with plans for repeat EGD. Understanding ulcer so that we can have the best info possible regarding restarting anti-coagulation important. Will replete K, continue amio, Tikosyn is washing out, and will plan to start low dose ACE inhibitor once blood pressure stabilizes. His stools appear to be improving.  Kahmari Herard,M.D.  06/11/2014 8:07 AM

## 2014-06-11 NOTE — Progress Notes (Signed)
Pt a/o, no c/o pain, pt still having bloody stools, MD aware, no c/o dizziness or weakness, pt stable

## 2014-06-12 ENCOUNTER — Encounter (HOSPITAL_COMMUNITY)
Admission: EM | Disposition: A | Discharge status: Home / Self Care | Payer: BC Managed Care – PPO | Source: Home / Self Care | Attending: Internal Medicine

## 2014-06-12 ENCOUNTER — Encounter (HOSPITAL_COMMUNITY): Payer: Self-pay | Admitting: Gastroenterology

## 2014-06-12 ENCOUNTER — Encounter: Payer: Self-pay | Admitting: Internal Medicine

## 2014-06-12 DIAGNOSIS — K260 Acute duodenal ulcer with hemorrhage: Secondary | ICD-10-CM

## 2014-06-12 HISTORY — PX: ESOPHAGOGASTRODUODENOSCOPY: SHX5428

## 2014-06-12 LAB — BASIC METABOLIC PANEL
Anion gap: 10 (ref 5–15)
BUN: 13 mg/dL (ref 6–23)
CHLORIDE: 109 meq/L (ref 96–112)
CO2: 23 meq/L (ref 19–32)
Calcium: 7.7 mg/dL — ABNORMAL LOW (ref 8.4–10.5)
Creatinine, Ser: 1 mg/dL (ref 0.50–1.35)
GFR calc Af Amer: 89 mL/min — ABNORMAL LOW (ref >90)
GFR calc non Af Amer: 76 mL/min — ABNORMAL LOW (ref >90)
GLUCOSE: 101 mg/dL — AB (ref 70–99)
Potassium: 4 mEq/L (ref 3.7–5.3)
SODIUM: 142 meq/L (ref 137–147)

## 2014-06-12 SURGERY — EGD (ESOPHAGOGASTRODUODENOSCOPY)
Anesthesia: Moderate Sedation

## 2014-06-12 MED ORDER — FENTANYL CITRATE 0.05 MG/ML IJ SOLN
INTRAMUSCULAR | Status: AC
  Filled 2014-06-12: qty 2

## 2014-06-12 MED ORDER — FENTANYL CITRATE 0.05 MG/ML IJ SOLN
INTRAMUSCULAR | Status: DC | PRN
  Administered 2014-06-12: 25 ug via INTRAVENOUS

## 2014-06-12 MED ORDER — MIDAZOLAM HCL 10 MG/2ML IJ SOLN
INTRAMUSCULAR | Status: DC | PRN
  Administered 2014-06-12: 1 mg via INTRAVENOUS
  Administered 2014-06-12: 2 mg via INTRAVENOUS

## 2014-06-12 MED ORDER — MIDAZOLAM HCL 5 MG/ML IJ SOLN
INTRAMUSCULAR | Status: AC
  Filled 2014-06-12: qty 2

## 2014-06-12 MED ORDER — BUTAMBEN-TETRACAINE-BENZOCAINE 2-2-14 % EX AERO
INHALATION_SPRAY | CUTANEOUS | Status: DC | PRN
  Administered 2014-06-12: 2 via TOPICAL

## 2014-06-12 MED ORDER — SODIUM CHLORIDE 0.9 % IV SOLN
INTRAVENOUS | Status: DC
  Administered 2014-06-12: 500 mL via INTRAVENOUS
  Administered 2014-06-12: 20 mL/h via INTRAVENOUS

## 2014-06-12 NOTE — Progress Notes (Signed)
Pt returned from endoscopy. Pt alert and oriented x 4. Denies pain or concerns. Call bell in reach. Will continue to monitor pt closely.  Juliane Lack, RN

## 2014-06-12 NOTE — Progress Notes (Signed)
Patient ID: [name], male   DOB: [birth year], 68 y.o.   MRN: [id number]   Patient Name: Bryan Moran Date of Encounter: 06/12/2014     Active Problems:   Persistent atrial fibrillation   Atrial fibrillation   Hematemesis   Coagulopathy   Acute gastric ulcer with hemorrhage   Acute blood loss anemia    SUBJECTIVE  Awaiting endoscopy.  denies chest pain or sob.  CURRENT MEDS . amiodarone  400 mg Oral BID  . pantoprazole (PROTONIX) IV  40 mg Intravenous Q12H    OBJECTIVE  Filed Vitals:   06/11/14 0509 06/11/14 1351 06/11/14 2024 06/12/14 0545  BP: 124/70 110/72 125/74 109/82  Pulse: 59 77 77 88  Temp: 98.2 F (36.8 C) 97.7 F (36.5 C) 98.9 F (37.2 C) 98.3 F (36.8 C)  TempSrc: Oral Oral Oral Oral  Resp: Height:      Weight: 255 lb 4.7 oz (115.8 kg)   255 lb 15.3 oz (116.1 kg)  SpO2: 96% 97% 98% 97%    Intake/Output Summary (Last 24 hours) at 06/12/14 1157 Last data filed at 06/11/14 2028  Gross per 24 hour  Intake    580 ml  Output      0 ml  Net    580 ml   Filed Weights   06/10/14 0703 06/11/14 0509 06/12/14 0545  Weight: 254 lb 13.6 oz (115.6 kg) 255 lb 4.7 oz (115.8 kg) 255 lb 15.3 oz (116.1 kg)    PHYSICAL EXAM  General: Pleasant, 68 yo man NAD. Neuro: Alert and oriented X 3. Moves all extremities spontaneously. HEENT:  Normal  Neck: Supple without bruits or JVD. Lungs:  Resp regular and unlabored, CTA. Heart: IRRR no s3, s4, or murmurs. Abdomen: Soft, non-tender, non-distended, BS + x 4.  Extremities: No clubbing, cyanosis or edema. DP/PT/Radials 2+ and equal bilaterally.  Accessory Clinical Findings  CBC  Recent Labs  06/10/14 0439 06/11/14 0344  WBC 7.0 6.2  NEUTROABS 4.8  --   HGB 8.1* 8.1*  HCT 24.6* 24.0*  MCV 95.3 94.5  PLT 150 168   Basic Metabolic Panel  Recent Labs  06/11/14 0344 06/12/14 0258  NA 140 142  K 3.2* 4.0  CL 106 109  CO2 24 23  GLUCOSE 97 101*  BUN 14 13  CREATININE 0.91 1.00  CALCIUM 7.4*  7.7*  MG 2.0  --    Liver Function Tests No results found for this basename: AST, ALT, ALKPHOS, BILITOT, PROT, ALBUMIN,  in the last 72 hours No results found for this basename: LIPASE, AMYLASE,  in the last 72 hours Cardiac Enzymes No results found for this basename: CKTOTAL, CKMB, CKMBINDEX, TROPONINI,  in the last 72 hours BNP No components found with this basename: POCBNP,  D-Dimer No results found for this basename: DDIMER,  in the last 72 hours Hemoglobin A1C No results found for this basename: HGBA1C,  in the last 72 hours Fasting Lipid Panel No results found for this basename: CHOL, HDL, LDLCALC, TRIG, CHOLHDL, LDLDIRECT,  in the last 72 hours Thyroid Function Tests No results found for this basename: TSH, T4TOTAL, FREET3, T3FREE, THYROIDAB,  in the last 72 hours  TELE  Atrial fib/nsr/nsvt    ASSESSMENT AND PLAN  1. Atrial fib with an RVR 2. 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 LBBB 4. GI bleed, s/p cauterization of a bleeding ulcer Rec: Appreciate Dr. Radene Journey followup. Agree with plans for repeat EGD today. Will replete K, continue amiodarone for  rate control and will plan to start low dose ACE inhibitor once blood pressure stabilizes. His stools appear to be improving.  Bryan Moran,M.D.  06/12/2014 11:57 AM

## 2014-06-12 NOTE — Op Note (Signed)
[facility] [address] [place], [address]   ENDOSCOPY PROCEDURE REPORT  PATIENT: Bryan Moran, Bryan Moran  MR#: [id number] BIRTHDATE: [birth year] , 67  yrs. old GENDER: Male ENDOSCOPIST: Iva Boop, MD, FA CG PROCEDURE DATE:  06/12/2014 PROCEDURE:  EGD, diagnostic ASA CLASS:     Class III INDICATIONS:  f/u ulcer. MEDICATIONS: Fentanyl 25 mcg IV and Versed 3 mg IV TOPICAL ANESTHETIC: Cetacaine Spray  DESCRIPTION OF PROCEDURE: After the risks benefits and alternatives of the procedure were thoroughly explained, informed consent was obtained.  The Pentax Gastroscope [id number] endoscope was introduced through the mouth and advanced to the second portion of the duodenum. Without limitations.  The instrument was slowly withdrawn as the mucosa was fully examined.        DUODENUM: A single ulcer with surrounding edema was found in the duodenal bulb.  STOMACH: A small hiatal hernia was noted.  The remainder of the upper endoscopy exam was otherwise normal. Retroflexed views revealed a hiatal hernia.     The scope was then withdrawn from the patient and the procedure completed.  COMPLICATIONS: There were no complications. ENDOSCOPIC IMPRESSION: 1.   Single ulcer was found in the duodenal bulb edema mostly - must be ulcer after embolization 2.   Small hiatal hernia 3.   The remainder of the upper endoscopy exam was otherwise normal  RECOMMENDATIONS: 1) stay on PPI chronically - qd ok 2) If cardiology thinks ASA has benefits now - 81 mg qd ok 3) Hold anti-coag Tx until 2 weeks from embolization 4) Stay off Aleve, NSAID if at all possible,, consider topical Tx, acetaminophen 5) Would d/c on ferrous sulfate 325 mg qd 6) See me 11/13 11AM, will have him get a CBC after dc (next week) at my office - he should do on 9/22 go to [facility] basement lab    eSigned:  Iva Boop, MD, [facility] 06/12/2014 2:51 PM  [C

## 2014-06-13 ENCOUNTER — Encounter (HOSPITAL_COMMUNITY): Payer: Self-pay | Admitting: Internal Medicine

## 2014-06-13 LAB — BASIC METABOLIC PANEL
ANION GAP: 13 (ref 5–15)
BUN: 11 mg/dL (ref 6–23)
CO2: 21 meq/L (ref 19–32)
Calcium: 7.7 mg/dL — ABNORMAL LOW (ref 8.4–10.5)
Chloride: 106 mEq/L (ref 96–112)
Creatinine, Ser: 0.94 mg/dL (ref 0.50–1.35)
GFR calc Af Amer: >90 mL/min (ref >90)
GFR calc non Af Amer: 85 mL/min — ABNORMAL LOW (ref >90)
GLUCOSE: 94 mg/dL (ref 70–99)
Potassium: 4 mEq/L (ref 3.7–5.3)
SODIUM: 140 meq/L (ref 137–147)

## 2014-06-13 LAB — CBC
HEMATOCRIT: 24.4 % — AB (ref 39.0–52.0)
Hemoglobin: 8.1 g/dL — ABNORMAL LOW (ref 13.0–17.0)
MCH: 31.6 pg (ref 26.0–34.0)
MCHC: 33.2 g/dL (ref 30.0–36.0)
MCV: 95.3 fL (ref 78.0–100.0)
Platelets: 197 10*3/uL (ref 150–400)
RBC: 2.56 MIL/uL — AB (ref 4.22–5.81)
RDW: 14.8 % (ref 11.5–15.5)
WBC: 5.6 10*3/uL (ref 4.0–10.5)

## 2014-06-13 MED ORDER — CARVEDILOL 6.25 MG PO TABS
6.2500 mg | ORAL_TABLET | Freq: Two times a day (BID) | ORAL | Status: DC
  Administered 2014-06-13 – 2014-06-14 (×2): 6.25 mg via ORAL
  Filled 2014-06-13 (×4): qty 1

## 2014-06-13 MED ORDER — LISINOPRIL 5 MG PO TABS
5.0000 mg | ORAL_TABLET | Freq: Every day | ORAL | Status: DC
  Administered 2014-06-13 – 2014-06-14 (×2): 5 mg via ORAL
  Filled 2014-06-13 (×2): qty 1

## 2014-06-13 NOTE — Progress Notes (Signed)
SUBJECTIVE: The patient is doing well today.  At this time, he denies chest pain, shortness of breath, or any new concerns.  He feels much improved.    S/p repeat EGD yesterday - recommendations from GI - chronic PPI, ASA  ok to start now, hold OAC until 06-21-14 (2 weeks post embolization), avoid NSAIDS, DC home on iron supplementation.   CURRENT MEDICATIONS: . amiodarone  400 mg Oral BID  . pantoprazole (PROTONIX) IV  40 mg Intravenous Q12H      OBJECTIVE: Physical Exam: Filed Vitals:   06/12/14 1430 06/12/14 1435 06/12/14 1455 06/13/14 0520  BP: 153/99 147/74 146/82 137/97  Pulse: 98 96 47 75  Temp:   98 F (36.7 C) 98.4 F (36.9 C)  TempSrc:   Oral Oral  Resp: Height:      Weight:    254 lb 6.6 oz (115.4 kg)  SpO2: 100% 96% 96% 97%    Intake/Output Summary (Last 24 hours) at 06/13/14 0740 Last data filed at 06/12/14 1339  Gross per 24 hour  Intake      0 ml  Output      0 ml  Net      0 ml    Telemetry reveals sinus rhythm with runs of atrial tach  GEN- The patient is well appearing, alert and oriented x 3 today.   Head- normocephalic, atraumatic Eyes-  Sclera clear, conjunctiva pink Ears- hearing intact Oropharynx- clear Neck- supple, no JVP Lungs- Clear to ausculation bilaterally, normal work of breathing Heart- Regular rate and rhythm with frequent atrial ectopy, no murmurs, rubs or gallops, PMI not laterally displaced GI- soft, NT, ND, + BS Extremities- no clubbing, cyanosis, or edema Skin- no rash or lesion Psych- euthymic mood, full affect Neuro- strength and sensation are intact  LABS: Basic Metabolic Panel:  Recent Labs  40/98/11 0344 06/12/14 0258 06/13/14 0407  NA 140 142 140  K 3.2* 4.0 4.0  CL 106 109 106  CO2 GLUCOSE 97 101* 94  BUN CREATININE 0.91 1.00 0.94  CALCIUM 7.4* 7.7* 7.7*  MG 2.0  --   --    CBC:  Recent Labs  06/11/14 0344 06/13/14 0407  WBC 6.2 5.6  HGB 8.1* 8.1*  HCT  24.0* 24.4*  MCV 94.5 95.3  PLT 168 197    RADIOLOGY: Dg Chest Portable 1 View 06/02/2014   CLINICAL DATA:  Shortness of breath, fatigue, history of atrial fibrillation.  EXAM: PORTABLE CHEST - 1 VIEW  COMPARISON:  None.  FINDINGS: Markedly enlarged cardiac silhouette and mediastinal contours. Pulmonary venous congestion without frank evidence of edema. Bibasilar heterogeneous opacities, left greater than right. No definite pleural effusion, though note, the bilateral costophrenic angles are excluded from view. No pneumothorax. Unchanged bones.  IMPRESSION: Marked cardiomegaly and pulmonary venous congestion without frank evidence of edema. Further evaluation with a PA and lateral chest radiograph may be obtained as clinically indicated.   Electronically Signed   By: Simonne Come M.D.   On: 06/02/2014 10:54    ASSESSMENT AND PLAN:  Active Problems:   Persistent atrial fibrillation   Atrial fibrillation   Hematemesis   Coagulopathy   Acute duodenal ulcer with hemorrhage   Acute blood loss anemia  1. Upper GI bleed  S/p IR embolization of GDA and right gastric artery.   Stools improving Hgb stable Will need PPI and iron at discharge  2. afib  Maintaining sinus  rhythm on Amiodarone Continue amiodarone 400mg  BID x 2 weeks the 400mg  daily x 2 weeks then 200mg  daily Would NOT recommend aspirin and data for stroke reduction related to afib is low and   3. Acute systolic dysfunction  Likely due to afib with RVR  No real ischemic symptoms--> consider outpatient myoview once more stable Repeat echo in 3 months  Resume Coreg and add lisinopril  4. HTN Resume Coreg and add lisinopril  5. Moderate MR  Follow clinically  Anticipate discharge to home tomorrow Will need a transition of care visit with Rudi Coco NP within 7 days

## 2014-06-14 ENCOUNTER — Other Ambulatory Visit: Payer: Self-pay

## 2014-06-14 DIAGNOSIS — K269 Duodenal ulcer, unspecified as acute or chronic, without hemorrhage or perforation: Secondary | ICD-10-CM

## 2014-06-14 MED ORDER — AMIODARONE HCL 400 MG PO TABS: 400.0000 mg | ORAL_TABLET | Freq: Two times a day (BID) | ORAL | Status: DC

## 2014-06-14 MED ORDER — CARVEDILOL 6.25 MG PO TABS: 6.2500 mg | ORAL_TABLET | Freq: Two times a day (BID) | ORAL | Status: DC

## 2014-06-14 MED ORDER — LISINOPRIL 5 MG PO TABS: 5.0000 mg | ORAL_TABLET | Freq: Every day | ORAL | Status: DC

## 2014-06-14 NOTE — Progress Notes (Signed)
At 1546 all d/c instructions explained and given to pt and sister Julieanne Cotton.  Verbalized understanding.  D/c off floor via w/c to awaiting transport by NT.  Amanda Pea, Charity fundraiser.

## 2014-06-14 NOTE — Progress Notes (Signed)
Call received from [facility] from [facility] stating that no belongings found and also spoke with Lauren on same floor who also said that pt has no belongings on the floor.  Pt & his sister Julieanne Cotton notified.

## 2014-06-14 NOTE — Discharge Summary (Signed)
ELECTROPHYSIOLOGY PROCEDURE DISCHARGE SUMMARY    Patient ID: Bryan Moran,  MRN: [id number], DOB/AGE: Jul 06, 1946 67 y.o.  Admit date: 06/02/2014 Discharge date: 06/14/2014  Primary Care Physician: Lorenda Peck, MD Electrophysiologist: Allred  Primary Discharge Diagnosis:  1.  Atrial fibrillation 2.  Upper GI bleed s/p IR embolization of GDA and right gastric artery this admission 3.  Acute systolic dysfunction  Secondary Discharge Diagnosis:  1.  Hypertension 2.  Moderate MR 3.  Eczema  No Known Allergies  Consultations: GI - Dr Elnoria Howard Interventional Radiology - Dr Archer [facility] Surgery - Dr Luisa Hart  Procedures This Admission:  1.  Echocardiogram on 06-03-14 demonstrated EF 15-20%, diffuse hypokinesis, grade 2 diastolic dysfunction, mild AR, moderate MR, LA 53. 2.  TEE/DCCV on 06-05-14 by Dr Mayford Knife.  There was no evidence of LAA thrombus and the patient was cardioverted with ERAF. 3.  EGD on 06-07-14 by Dr Elnoria Howard.  This demonstrated large pyloric channel versus duodenal bulb clot with active  Bleeding 4.  Coil emobolization of GDA and right gastric artery on 06-08-14 by Dr Ruthe Mannan 5.  EGD on 06-12-14 by Dr Leone Payor.  This demonstrated single ulcer was found in the duodenal bulb edema mostly - must  be ulcer after embolization; small hiatal hernia   Brief HPI: [name] is a 68 y.o. male with no significant past medical history. Prior to admission he had been active running 4 miles several times per week.  On the Monday prior to admission, he was evaluated by his PCP for fatigue.  He was found to be in atrial fibrillation at that time and prescribed Metoprolol.  On the day of admission, he developed shortness of breath and acute fatigue.  He called 911 and was found to be in AF with RVR, ventricular rates 130-140's.  He was transferred to [facility] for further evaluation.   Hospital Course:  The patient was admitted and placed on PO Cardizem and Eliquis for anticoagulation.   Echocardiogram demonstrated newly identified LV dysfunction and TEE/DCCV was recommended.  He underwent TEE/DCCV on 06-05-14 with ERAF shortly after the procedure.  Tikosyn was then initiated to maintain sinus rhythm.  On the evening of 06-07-14, he developed acute hematemesis.  He was evaluated by GI who undertook EGD with details as outlined above. He then underwent coiling of GDA and right gastric artery on 06-07-14.  Post procedure, he was hypotensive and hemoglobin dropped to 8.  He was monitored in the ICU and required pressor support for a short time post procedure.  His blood pressure improved and Tikosyn was discontinued due to ineffectiveness.  He was then placed on Amiodarone for rhythm support.  It was felt that maintenance of sinus rhythm was important for LV dysfunction despite inability to anti-coagulate at the present time.  He progressed over the next several days and maintained SR on Amiodarone.  Heart failure medications were added to medication regimen with good tolerance.  Repeat EGD on 06-12-14 with details as outlined above.  On 06-14-14, he was evaluated by Dr Ladona Ridgel with details as outlined above.   Per GI recommendations, patient will be discharged home on Protonix daily as well as po iron supplementation.  He will also be maintained on amiodarone for maintenance of SR.  Lisinopril and Coreg will be added for LV dysfunction. Prinzide will be discontinued.   He will have early follow up with both GI and Rudi Coco, NP in the atrial fibrillation clinic.   Discharge Vitals: Blood pressure 115/57, pulse 68,  temperature 98.4 F (36.9 C), temperature source Oral, resp. rate 20, height 6\' 3"  (1.905 m), weight 254 lb 13.6 oz (115.6 kg), SpO2 97.00%.   Labs:   Lab Results  Component Value Date   WBC 5.6 06/13/2014   HGB 8.1* 06/13/2014   HCT 24.4* 06/13/2014   MCV 95.3 06/13/2014   PLT 197 06/13/2014    Recent Labs Lab 06/08/14 0801  06/13/14 0407  NA 146  < > 140  K 4.7  < > 4.0    CL 111  < > 106  CO2 27  < > 21  BUN 40*  < > 11  CREATININE 1.11  < > 0.94  CALCIUM 7.7*  < > 7.7*  PROT 4.3*  --   --   BILITOT 1.0  --   --   ALKPHOS 34*  --   --   ALT 34  --   --   AST 16  --   --   GLUCOSE 157*  < > 94  < > = values in this interval not displayed.  Discharge Medications:    Medication List    STOP taking these medications       ALEVE PO     lisinopril-hydrochlorothiazide 20-12.5 MG per tablet  Commonly known as:  PRINZIDE,ZESTORETIC      TAKE these medications       amiodarone 400 MG tablet  Commonly known as:  PACERONE  Take 1 tablet (400 mg total) by mouth 2 (two) times daily.     carvedilol 6.25 MG tablet  Commonly known as:  COREG  Take 1 tablet (6.25 mg total) by mouth 2 (two) times daily with a meal.     ELETONE EX  Apply 1 application topically 2 (two) times daily as needed (eczema).     lisinopril 5 MG tablet  Commonly known as:  PRINIVIL,ZESTRIL  Take 1 tablet (5 mg total) by mouth daily.        Disposition:   Follow-up Information   Follow up with Stan Head, MD On 06/18/2014. (Complete blood count  [facility] )    Specialty:  Gastroenterology   Contact information:   [address] [place] [address] [phone]       Follow up with Stan Head, MD On 08/09/2014. (11AM)    Specialty:  Gastroenterology   Contact information:   [address] [place] [address] [phone]       Duration of Discharge Encounter: Greater than 30 minutes including physician time.  Signed,  Marily Lente RN   EP Attending  Patient seen and examined. Agree with above. Ok for discharge.   Leonia Reeves.D.

## 2014-06-14 NOTE — Progress Notes (Signed)
Pt stated "I left my bags with my belongings: clothes and wallet, on another floor."  Security called and informed and also pt stated rooms that he has been admitted in to help security.   F/U call placed to security and spoke with Mr.Pleasant who informed nurse that they have not able to located pt's belongs.  Pt and his sister at bedside made aware.  Verbalized understanding.  Amanda Pea, Charity fundraiser.

## 2014-06-14 NOTE — Progress Notes (Signed)
SUBJECTIVE: The patient is doing well today.  At this time, he denies chest pain, shortness of breath, or any new concerns.  He feels much improved.    S/p repeat EGD - recommendations from GI - chronic PPI, ASA 81mg  ok to start now, hold OAC until 06-21-14 (2 weeks post embolization), avoid NSAIDS, DC home on iron supplementation.   CURRENT MEDICATIONS: . amiodarone  400 mg Oral BID  . carvedilol  6.25 mg Oral BID WC  . lisinopril  5 mg Oral Daily  . pantoprazole (PROTONIX) IV  40 mg Intravenous Q12H      OBJECTIVE: Physical Exam: Filed Vitals:   06/13/14 1735 06/13/14 2054 06/14/14 0431 06/14/14 0557  BP: 108/73 120/102 147/70 127/81  Pulse: 87 71 81 79  Temp:  98 F (36.7 C) 98.4 F (36.9 C)   TempSrc:  Oral Oral   Resp:  20 20   Height:      Weight:   254 lb 13.6 oz (115.6 kg)   SpO2:  100% 97%     Intake/Output Summary (Last 24 hours) at 06/14/14 4268 Last data filed at 06/14/14 0920  Gross per 24 hour  Intake   1000 ml  Output      0 ml  Net   1000 ml    Telemetry reveals sinus rhythm with runs of atrial tach  GEN- The patient is well appearing, alert and oriented x 3 today.   Head- normocephalic, atraumatic Eyes-  Sclera clear, conjunctiva pink Ears- hearing intact Oropharynx- clear Neck- supple, no JVP Lungs- Clear to ausculation bilaterally, normal work of breathing Heart- Regular rate and rhythm with frequent atrial ectopy, no murmurs, rubs or gallops, PMI not laterally displaced GI- soft, NT, ND, + BS Extremities- no clubbing, cyanosis, or edema Skin- no rash or lesion Psych- euthymic mood, full affect Neuro- strength and sensation are intact  LABS: Basic Metabolic Panel:  Recent Labs  34/19/62 0258 06/13/14 0407  NA 142 140  K 4.0 4.0  CL 109 106  CO2 23 21  GLUCOSE 101* 94  BUN 13 11  CREATININE 1.00 0.94  CALCIUM 7.7* 7.7*   CBC:  Recent Labs  06/13/14 0407  WBC 5.6  HGB 8.1*  HCT 24.4*  MCV 95.3  PLT 197     RADIOLOGY: Dg Chest Portable 1 View 06/02/2014   CLINICAL DATA:  Shortness of breath, fatigue, history of atrial fibrillation.  EXAM: PORTABLE CHEST - 1 VIEW  COMPARISON:  None.  FINDINGS: Markedly enlarged cardiac silhouette and mediastinal contours. Pulmonary venous congestion without frank evidence of edema. Bibasilar heterogeneous opacities, left greater than right. No definite pleural effusion, though note, the bilateral costophrenic angles are excluded from view. No pneumothorax. Unchanged bones.  IMPRESSION: Marked cardiomegaly and pulmonary venous congestion without frank evidence of edema. Further evaluation with a PA and lateral chest radiograph may be obtained as clinically indicated.   Electronically Signed   By: Simonne Come M.D.   On: 06/02/2014 10:54    ASSESSMENT AND PLAN:  Active Problems:   Persistent atrial fibrillation   Atrial fibrillation   Hematemesis   Coagulopathy   Acute duodenal ulcer with hemorrhage   Acute blood loss anemia  1. Upper GI bleed  S/p IR embolization of GDA and right gastric artery.   Stools improving Hgb stable Will need PPI and iron at discharge  2. afib  Maintaining sinus rhythm on Amiodarone Continue amiodarone 400mg  BID x 2 weeks the 400mg  daily x 2 weeks  then  daily Would NOT recommend aspirin and data for stroke reduction related to afib is low and  Will start Eliquis or another NOAC in 1-2 weeks.  3. Acute systolic dysfunction  Likely due to afib with RVR  No real ischemic symptoms--> consider outpatient myoview once more stable Repeat echo in 3 months  Resume Coreg and add lisinopril  4. HTN Resume Coreg and add lisinopril  5. Moderate MR  Follow clinically  Transition of care visit with Rudi Coco NP within 7 days  Leonia Reeves.D.

## 2014-06-18 ENCOUNTER — Other Ambulatory Visit (INDEPENDENT_AMBULATORY_CARE_PROVIDER_SITE_OTHER): Payer: BC Managed Care – PPO

## 2014-06-18 DIAGNOSIS — K269 Duodenal ulcer, unspecified as acute or chronic, without hemorrhage or perforation: Secondary | ICD-10-CM

## 2014-06-18 LAB — CBC WITH DIFFERENTIAL/PLATELET
BASOS ABS: 0 10*3/uL (ref 0.0–0.1)
Basophils Relative: 0.6 % (ref 0.0–3.0)
Eosinophils Absolute: 0.1 10*3/uL (ref 0.0–0.7)
Eosinophils Relative: 2.7 % (ref 0.0–5.0)
Hemoglobin: 8.3 g/dL — ABNORMAL LOW (ref 13.0–17.0)
LYMPHS ABS: 0.6 10*3/uL — AB (ref 0.7–4.0)
Lymphocytes Relative: 11.6 % — ABNORMAL LOW (ref 12.0–46.0)
MCHC: 33 g/dL (ref 30.0–36.0)
MCV: 95.3 fl (ref 78.0–100.0)
Monocytes Absolute: 0.5 10*3/uL (ref 0.1–1.0)
Monocytes Relative: 9.6 % (ref 3.0–12.0)
Neutro Abs: 3.8 10*3/uL (ref 1.4–7.7)
Neutrophils Relative %: 75.5 % (ref 43.0–77.0)
PLATELETS: 287 10*3/uL (ref 150.0–400.0)
RBC: 2.65 Mil/uL — ABNORMAL LOW (ref 4.22–5.81)
RDW: 15.3 % (ref 11.5–15.5)
WBC: 5 10*3/uL (ref 4.0–10.5)

## 2014-06-19 ENCOUNTER — Other Ambulatory Visit: Payer: Self-pay

## 2014-06-19 DIAGNOSIS — D62 Acute posthemorrhagic anemia: Secondary | ICD-10-CM

## 2014-06-19 MED ORDER — FERROUS SULFATE 325 (65 FE) MG PO TABS: 325.0000 mg | ORAL_TABLET | Freq: Two times a day (BID) | ORAL | Status: DC

## 2014-06-19 NOTE — Progress Notes (Signed)
Quick Note:  Hgb stable - good news 1) ferrous sulfate 325 mg bid w/ food (will make stools dark) 2) CBC 1 month 3) keep 11/13 f/u me ______

## 2014-06-24 ENCOUNTER — Ambulatory Visit (HOSPITAL_COMMUNITY): Payer: BC Managed Care – PPO | Attending: Internal Medicine | Admitting: Cardiology

## 2014-06-24 ENCOUNTER — Encounter: Payer: Self-pay | Admitting: Internal Medicine

## 2014-06-24 ENCOUNTER — Ambulatory Visit (INDEPENDENT_AMBULATORY_CARE_PROVIDER_SITE_OTHER): Payer: BC Managed Care – PPO | Admitting: Internal Medicine

## 2014-06-24 VITALS — BP 124/84 | HR 68 | Ht 75.0 in | Wt 258.6 lb

## 2014-06-24 DIAGNOSIS — I4819 Other persistent atrial fibrillation: Secondary | ICD-10-CM

## 2014-06-24 DIAGNOSIS — I10 Essential (primary) hypertension: Secondary | ICD-10-CM | POA: Insufficient documentation

## 2014-06-24 DIAGNOSIS — M7989 Other specified soft tissue disorders: Secondary | ICD-10-CM

## 2014-06-24 DIAGNOSIS — I4891 Unspecified atrial fibrillation: Secondary | ICD-10-CM

## 2014-06-24 DIAGNOSIS — R609 Edema, unspecified: Secondary | ICD-10-CM | POA: Insufficient documentation

## 2014-06-24 DIAGNOSIS — M79609 Pain in unspecified limb: Secondary | ICD-10-CM

## 2014-06-24 DIAGNOSIS — M79604 Pain in right leg: Secondary | ICD-10-CM

## 2014-06-24 MED ORDER — PANTOPRAZOLE SODIUM 40 MG PO TBEC: 40.0000 mg | DELAYED_RELEASE_TABLET | Freq: Every day | ORAL | Status: DC

## 2014-06-24 MED ORDER — CEPHALEXIN 500 MG PO CAPS: 500.0000 mg | ORAL_CAPSULE | Freq: Four times a day (QID) | ORAL | Status: DC

## 2014-06-24 NOTE — Patient Instructions (Addendum)
Your physician has recommended you make the following change in your medication:  1.) begin Protonix 40 mg daily 2.) begin Keflex 500 mg four times a day for 7 days Your physician recommends that you return for lab work tomorrow (BMET, CBC) Your physician recommends that you schedule a follow-up appointment next week with Tereso Newcomer, PA Your physician recommends that you schedule a follow-up appointment in: 4 weeks with Dr. Johney Frame. Your physician has requested that you have an MRA of your right lower extremity to rule out a blood clot. This can be done tomorrow 06/25/14 at [facility]  [address] [place]

## 2014-06-24 NOTE — Progress Notes (Signed)
Primary Care Physician: Lorenda Peck, MD Referring Physician : Dr. Champ Mungo is a 68 y.o. male with a h/o hospitalization 9-6 thru 9-18 for Afib with RVR. He was started on Eliquis and developed upper GI bleed, with all blood thinners on hold. He underwent embolizaiton of GDA and rt gastric artery, without further bleeding.Marland Kitchen He was also found to have acute systolic dysfunction with EF of 15-20%. He was treated with AAD amiodarone and presents today in NSR.  He denies any further heart irregularity  or bleeding. His complaint today is swelling, redness, and pain of the rt lower extremity which has increased over the last week since discharge.   He is making slow progress.  He has had no further GI bleeding.  Today, he denies symptoms of palpitations, chest pain, shortness of breath, orthopnea, PND, , dizziness, presyncope, syncope, or neurologic sequela. Positive for rt lower extremity swelling, redness and pain. The patient is tolerating medications without difficulties and is otherwise without complaint today.   Past Medical History  Diagnosis Date  . Hypertension   . A-fib     a. Officially dx 05/27/14, sx may have been going on longer.  . Eczema   . Dysrhythmia   . Acute duodenal ulcer with hemorrhage 06/10/2014  . Acute blood loss anemia   . Atrial fibrillation, persistent   . Coagulopathy    Past Surgical History  Procedure Laterality Date  . Hernia repair  1993    [facility]  . Tee without cardioversion N/A 06/05/2014    Procedure: TRANSESOPHAGEAL ECHOCARDIOGRAM (TEE);  Surgeon: Quintella Reichert, MD;  Location: [facility] ENDOSCOPY;  Service: Cardiovascular;  Laterality: N/A;  . Cardioversion N/A 06/05/2014    Procedure: CARDIOVERSION;  Surgeon: Quintella Reichert, MD;  Location: [facility] ENDOSCOPY;  Service: Cardiovascular;  Laterality: N/A;  . Esophagogastroduodenoscopy N/A 06/07/2014    Procedure: ESOPHAGOGASTRODUODENOSCOPY (EGD);  Surgeon: Theda Belfast, MD;   Location: [facility] ENDOSCOPY;  Service: Endoscopy;  Laterality: N/A;  . Esophagogastroduodenoscopy N/A 06/12/2014    Procedure: ESOPHAGOGASTRODUODENOSCOPY (EGD);  Surgeon: Iva Boop, MD;  Location: [facility] ENDOSCOPY;  Service: Endoscopy;  Laterality: N/A;    Current Outpatient Prescriptions  Medication Sig Dispense Refill  . amiodarone (PACERONE) 200 MG tablet Take 400 mg by mouth 2 (two) times daily.      . carvedilol (COREG) 6.25 MG tablet Take 1 tablet (6.25 mg total) by mouth 2 (two) times daily with a meal.  60 tablet  6  . Dermatological Products, Misc. (ELETONE EX) Apply 1 application topically 2 (two) times daily as needed (eczema).      . ferrous sulfate 325 (65 FE) MG tablet Take 1 tablet (325 mg total) by mouth 2 (two) times daily with a meal.    3  . lisinopril (PRINIVIL,ZESTRIL) 5 MG tablet Take 1 tablet (5 mg total) by mouth daily.  30 tablet  6  . cephALEXin (KEFLEX) 500 MG capsule Take 1 capsule (500 mg total) by mouth 4 (four) times daily.  28 capsule  0  . pantoprazole (PROTONIX) 40 MG tablet Take 1 tablet (40 mg total) by mouth daily.  30 tablet  11   No current facility-administered medications for this visit.    No Known Allergies  History   Social History  . Marital Status: Single    Spouse Name: N/A    Number of Children: N/A  . Years of Education: N/A   Occupational History  . Not on  file.   Social History Main Topics  . Smoking status: Never Smoker   . Smokeless tobacco: Never Used  . Alcohol Use: No  . Drug Use: No  . Sexual Activity: Not Currently   Other Topics Concern  . Not on file   Social History Narrative  . No narrative on file    Family History  Problem Relation Age of Onset  . Depression Father   . Heart disease Father     Some sort of heart issue at end of life - possibly CAD  . Multiple sclerosis Mother   . Cancer Mother     Tumor in kidney, spread to bones    ROS- All systems are reviewed and negative except as per the HPI  above  Physical Exam: Filed Vitals:   06/24/14 1555  BP: 124/84  Pulse: 68  Height:  (1.905 m)  Weight: 258 lb 9.6 oz (117.3 kg)    GEN- The patient is well appearing, alert and oriented x 3 today.   Head- normocephalic, atraumatic Eyes-  Sclera clear, conjunctiva pink Ears- hearing intact Oropharynx- clear Neck- supple  Lungs- Clear to ausculation bilaterally, normal work of breathing Heart- Regular rate and rhythm, no murmurs, rubs or gallops, PMI not laterally displaced GI- soft, NT, ND, + BS Extremities- left lower extremity  nml, RLE with swelling redness, warmth, especially medial aspect inferior to knee. Pulses are present. No bruit rt femoral artery. MS- no significant deformity or atrophy Skin- no rash or lesion Psych- euthymic mood, full affect Neuro- strength and sensation are intact  EKG-SR at 68 bpm with LBBB, Qtc .  Assessment and Plan: 1. Recent onset afib On amiodarone and maintaining SR Decrease amiodarone to 200 mg bid today. Unable to anticoagulate due to fresh GI bleed with a Chadsvasc of at least 3.  2. 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  3. GI Bleed with anemia with HGB 8.3 Start Protonix 40 mg a day (was not continued at hospital discharge).  Continue to avoid anticoagulation for now. On Iron. Repeat CBC and BMET today  4. Swelling of RLE Ultrasound today showed low risk for DVT but with high clinical suspicion for DVT. MRV tomorrow at [facility]. Start Keflex 500 mg every 6 hours x one week for redness/swelling, probable cellulitis. Elevate leg and minimize walking until results are known.  F/u Tereso Newcomer, PA in one week  F/u in AF clinic in one month  I have seen, examined the patient, and reviewed the above assessment and plan with Rudi Coco NP.  Changes to above are made where necessary.  The patients presents for follow-up after recent afib RVR  admission which was complicated by large upper GI bleed requiring s/p IR embolization of GDA and right gastric artery.  The patient somehow did not have PPI ordered at discharge and this is therefore restarted today.  His CHF is stable and he remains in sinus rhythm. Most concerning is marked R leg edema which was not present in the hospital.  I am concerned that this could be a DVT.  Though he had intervention by IR with R groin access, the groin looks good, there is no bruit, and [place] today does not reveal femoral artery complication.  His swelling is from the knee and below.  I remain very concerned about possible DVT even though the R leg [place] today is preliminarily read as negative.  I have had Dr  Nelson look at the patients leg with me today and she agrees.  We both agree that additional testing is required.  I will therefore order MRV of the leg to definitively evaluate the cause of leg edema.  I will have the patient return to see Tereso Newcomer at the next available appointment time to make sure that he is progressing appropriately given his very difficult clinical course recently.   Co Sign: Hillis Range, MD 06/24/2014 9:41 PM

## 2014-06-24 NOTE — Progress Notes (Signed)
Right lower extremity venous duplex performed  

## 2014-06-25 ENCOUNTER — Other Ambulatory Visit (INDEPENDENT_AMBULATORY_CARE_PROVIDER_SITE_OTHER): Payer: BC Managed Care – PPO | Admitting: *Deleted

## 2014-06-25 ENCOUNTER — Other Ambulatory Visit: Payer: BC Managed Care – PPO

## 2014-06-25 ENCOUNTER — Telehealth: Payer: Self-pay | Admitting: *Deleted

## 2014-06-25 ENCOUNTER — Other Ambulatory Visit (HOSPITAL_COMMUNITY): Payer: Self-pay | Admitting: Cardiology

## 2014-06-25 DIAGNOSIS — M7989 Other specified soft tissue disorders: Secondary | ICD-10-CM

## 2014-06-25 DIAGNOSIS — I4891 Unspecified atrial fibrillation: Secondary | ICD-10-CM

## 2014-06-25 DIAGNOSIS — I4819 Other persistent atrial fibrillation: Secondary | ICD-10-CM

## 2014-06-25 LAB — CBC
HCT: 25 % — ABNORMAL LOW (ref 39.0–52.0)
MCHC: 32.2 g/dL (ref 30.0–36.0)
MCV: 94.5 fl (ref 78.0–100.0)
Platelets: 279 10*3/uL (ref 150.0–400.0)
RBC: 2.65 Mil/uL — AB (ref 4.22–5.81)
RDW: 15.7 % — ABNORMAL HIGH (ref 11.5–15.5)
WBC: 6.6 10*3/uL (ref 4.0–10.5)

## 2014-06-25 LAB — BASIC METABOLIC PANEL
BUN: 14 mg/dL (ref 6–23)
CALCIUM: 7.9 mg/dL — AB (ref 8.4–10.5)
CO2: 28 mEq/L (ref 19–32)
Chloride: 105 mEq/L (ref 96–112)
Creatinine, Ser: 1.2 mg/dL (ref 0.4–1.5)
GFR: 63.6 mL/min (ref >60.00)
Glucose, Bld: 157 mg/dL — ABNORMAL HIGH (ref 70–99)
POTASSIUM: 4 meq/L (ref 3.5–5.1)
Sodium: 135 mEq/L (ref 135–145)

## 2014-06-25 MED ORDER — FUROSEMIDE 40 MG PO TABS: 40.0000 mg | ORAL_TABLET | Freq: Every day | ORAL | Status: DC

## 2014-06-25 NOTE — Telephone Encounter (Signed)
Lasix 40mg  1 tablet daily sent to patient's pharmacy.  Order placed for repeat LE duplex.

## 2014-06-25 NOTE — Telephone Encounter (Signed)
Unfortunately, upon speaking with [facility] radiology, they are unable to perform MRV as they do not have the correct gadolinium agent for this.  I have spoken with Dr Delton See again today who recommends repeat ultrasound in 48 hours or so to see if DVT is visualized at that time.  As he does not have a definitive diagnosis of DVT and has had recent GI bleeding, I feel that risks of anticoagulation are presently too high.  This should be reconsidered if repeat US reveals definitive DVT.   In the interim, I will start lasix 40mg  daily.    Lab results are reviewed with patient.  His anemia is stable and he denies active bleeding.  He is tolerating twice daily iron. Return on Thursday if our schedule will allow for repeat doppler.

## 2014-06-25 NOTE — Telephone Encounter (Signed)
Per Dr Delton See this pt needs an order for MR MRA PELVIS WITH CONTRAST and in the comment section state pt needs a venogram not arteriogram to evaluate for DVT. With a diagnosis unilateral calf swelling.

## 2014-06-25 NOTE — Addendum Note (Signed)
Addended by: Marily Lente on: 06/25/2014 08:06 PM   Modules accepted: Orders

## 2014-06-27 ENCOUNTER — Ambulatory Visit (HOSPITAL_COMMUNITY): Payer: BC Managed Care – PPO | Attending: Internal Medicine | Admitting: Cardiology

## 2014-06-27 DIAGNOSIS — I4891 Unspecified atrial fibrillation: Secondary | ICD-10-CM | POA: Diagnosis not present

## 2014-06-27 DIAGNOSIS — M7989 Other specified soft tissue disorders: Secondary | ICD-10-CM | POA: Diagnosis not present

## 2014-06-27 DIAGNOSIS — I8311 Varicose veins of right lower extremity with inflammation: Secondary | ICD-10-CM

## 2014-06-27 DIAGNOSIS — I10 Essential (primary) hypertension: Secondary | ICD-10-CM | POA: Insufficient documentation

## 2014-06-27 NOTE — Progress Notes (Signed)
Rt. LE Venous duplex performed. 

## 2014-07-01 ENCOUNTER — Encounter: Payer: Self-pay | Admitting: Cardiology

## 2014-07-01 ENCOUNTER — Ambulatory Visit (INDEPENDENT_AMBULATORY_CARE_PROVIDER_SITE_OTHER): Payer: BC Managed Care – PPO | Admitting: Cardiology

## 2014-07-01 VITALS — BP 122/70 | HR 70 | Ht 75.0 in | Wt 250.0 lb

## 2014-07-01 DIAGNOSIS — I4819 Other persistent atrial fibrillation: Secondary | ICD-10-CM

## 2014-07-01 DIAGNOSIS — I481 Persistent atrial fibrillation: Secondary | ICD-10-CM

## 2014-07-01 MED ORDER — CEPHALEXIN 500 MG PO CAPS: 500.0000 mg | ORAL_CAPSULE | Freq: Four times a day (QID) | ORAL | Status: DC

## 2014-07-01 NOTE — Patient Instructions (Addendum)
Keep scheduled follow up appt with Dr. Johney Frame on July 24, 2014.  Call us in one week with update on leg.  (may need another appt with PA prior to seeing Dr. Johney Frame if leg not improved)_  Your physician has recommended you make the following change in your medication:  Keflex 500 mg four times daily for 7 days.

## 2014-07-01 NOTE — Progress Notes (Signed)
Patient ID: Bryan Moran, male   DOB: 1946-01-31, 68 y.o.   MRN: [id number]    07/01/2014 Bryan Moran   02-Nov-1945  [id number]  Primary Physicia ROBERTS, Vernie Ammons, MD Primary Cardiologist: Dr. Johney Frame  HPI:  [name] presents to clinic today for followup. He is accompanied to clinic by his sister. He is a 68 year old male, followed by Dr. Johney Frame, who was recently hospitalized September 6 through September 18 for atrial fibrillaiton with RVR. Due to an elevated CHADS VASC score, he was started on Eliquis and developed an upper GI bleed. He underwent embolization of GDA and right gastric artery, without further bleeding. He was also found to have acute systolic dysfunction with an EF of 15-20%  He placed on AAD therapy with amiodarone. He was seen by Dr. Johney Frame for post hospital follow up 1 week ago. EKG demonstrated sinus rhythm. His amiodarone was reduced to 200 mg twice a day. He was also restarted on PPI therapy as this was inadvertently discontinued when he was released from the hospital.  During his office visit with Dr. Johney Frame, he was noted to have right lower extremity edema and erythema and there was high concern for DVT. Subsequently, Dr. Johney Frame ordered a lower extremity venous Doppler study. This was negative for DVT. Cellulitis was also on the differential and he was placed on empiric therapy with Keflex x7 days. However, suspicion for DVT remained high. Dr. Johney Frame ordered for him to undergo a MRV for further evaluation.  Unfortunately, upon speaking with [facility] radiology, they were unable to perform MRV as they did not have the correct gadolinium agent for this. Thus, it was recommended that he undergo repeat evaluation with a second ultrasound in 48 hours to see if a DVT could then be visualized. His was repeated in our office. 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 Likely thrombus was felt possible in some of these varicosities at the level of the right knee and proximal calf, although the majority were compressible.  He presents back to clinic today for follow-up. He continues to have visible swelling in the right lower extremity as well as mild erythema, however he notes that the appearance of his right leg has improved significantly with treatment with antibiotics. He has completed 6/7 days of his prescribed course. He denies any significant pain. No fevers chills, nausea or vomiting. No diarrhea.  In regards to his atrial fibrillation, his EKG today demonstrates sinus rhythm with marked sinus arrhythmia. P waves are present. He reports full medication compliance with amiodarone. He denies any symptoms of breakthrough atrial fibrillation. He also denies any further melena and hematochezia. He has been compliant with daily iron supplementation. Repeat post hospital CBC, 1 week ago, revealed continued anemia but stable hemoglobin at 8.1.  In regards to his heart failure, he denies any dyspnea, orthopnea or PND. No chest pain.      Current Outpatient Prescriptions  Medication Sig Dispense Refill  . amiodarone (PACERONE) 200 MG tablet Take 200 mg by mouth 2 (two) times daily.       . carvedilol (COREG) 6.25 MG tablet Take 1 tablet (6.25 mg total) by mouth 2 (two) times daily with a meal.  60 tablet  6  . cephALEXin (KEFLEX) 500 MG capsule Take 1 capsule (500 mg total) by mouth 4 (four) times daily.  28  capsule  0  . Dermatological Products, Misc. (ELETONE EX) Apply 1 application topically 2 (two) times daily as needed (eczema).      . ferrous sulfate 325 (65 FE) MG tablet Take 1 tablet (325 mg total) by mouth 2 (two) times daily with a meal.    3  . furosemide (LASIX) 40 MG tablet Take 1 tablet (40 mg total) by mouth daily.  30 tablet  1  . lisinopril (PRINIVIL,ZESTRIL) 5 MG tablet Take 1 tablet (5 mg total) by  mouth daily.  30 tablet  6  . pantoprazole (PROTONIX) 40 MG tablet Take 1 tablet (40 mg total) by mouth daily.  30 tablet  11   No current facility-administered medications for this visit.    No Known Allergies  History   Social History  . Marital Status: Single    Spouse Name: N/A    Number of Children: N/A  . Years of Education: N/A   Occupational History  . Not on file.   Social History Main Topics  . Smoking status: Never Smoker   . Smokeless tobacco: Never Used  . Alcohol Use: No  . Drug Use: No  . Sexual Activity: Not Currently   Other Topics Concern  . Not on file   Social History Narrative  . No narrative on file     Review of Systems: General: negative for chills, fever, night sweats or weight changes.  Cardiovascular: negative for chest pain, dyspnea on exertion, edema, orthopnea, palpitations, paroxysmal nocturnal dyspnea or shortness of breath Dermatological: negative for rash Respiratory: negative for cough or wheezing Urologic: negative for hematuria Abdominal: negative for nausea, vomiting, diarrhea, bright [place] blood per rectum, melena, or hematemesis Neurologic: negative for visual changes, syncope, or dizziness All other systems reviewed and are otherwise negative except as noted above.    Blood pressure 122/70, pulse 70, height 6\' 3"  (1.905 m), weight 250 lb (113.399 kg).  General appearance: alert, cooperative, no distress and moderately obese Neck: no carotid bruit and no JVD Lungs: clear to auscultation bilaterally Heart: regular rate and rhythm Extremities: right lower extremity is swollen although no pitting edema, there is mild erythema along the lateral aspect of the lower leg, just distal to the knee Pulses: 2+ and symmetric Skin: warm and dry with cellulitic skin changes along the distal-lateral aspect of the right knee Neurologic: Grossly normal  EKG Sinus rhythm with marked at sinus arrhythmia. HR 70 bpm.  ASSESSMENT AND PLAN:    1. Swelling and erythema of the right lower extremity: DVT has been ruled out by venous duplex. Given improvement with antibiotics, we will continue to treat as cellulitis. He notes significant improvement in the appearance of his right leg however, he continues to have moderate swelling and mild erythema. We'll repeat a second round of Keflex x 7 days.  2. Atrial fibrillation:  Maintaining normal sinus rhythm on amiodarone. Continued current dose at 200 mg twice a day. Despite CHADS VASC score of 3, continue to refrain from use of anti-coagulation given recent severe GI bleed.  3. Anemia: He also denies any further melena and hematochezia. He has been compliant with daily iron supplementation. Repeat post hospital CBC, 1 week ago, revealed continued anemia but stable hemoglobin at 8.1.   4. systolic dysfunction: Stable without signs of acute decompensation. Recent 2-D echo demonstrated EF of 15-20%.? That if this is tachycardia mediated. Continue ACE inhibitor, beta blocker and diuretic. Continue dose adjustments of ACE inhibitor and beta blocker per Dr. Johney FrameAllred. He  will require repeat 2-D echocardiogram in one to 2 months.   PLAN  Will continue antibiotic therapy with Keflex for an additional 7 days. Patient has been instructed to notify our office if signs/symptoms fail to improve or worsen. He is to keep his followup appointment with Dr. Johney Frame at the end of the month.  SIMMONS, BRITTAINYPA-C 07/01/2014 10:28 AM

## 2014-07-08 ENCOUNTER — Telehealth: Payer: Self-pay | Admitting: Internal Medicine

## 2014-07-08 NOTE — Telephone Encounter (Signed)
New message          Pt would like kelly to call him back / pt says Robbie Lis PA asked him to give [place] an update on him

## 2014-07-08 NOTE — Telephone Encounter (Signed)
He is better but is wondering if he could put warm compresses on area or if we think he is ready for support stockings.  I let him know I would discuss with Dr Johney Frame and get back with him

## 2014-07-08 NOTE — Telephone Encounter (Addendum)
I discussed with Dr Johney Frame and he says support hose may help but would like for him to have a primary Cardiologist to follow this closely.  He would like to refer him to Dr Delton See to follow

## 2014-07-10 NOTE — Telephone Encounter (Signed)
Spoke with Mr Dotter and let him know Dr Johney Frame felt support stocking may help.  Swelling is down and the pain is much better.  Have sent a message to Omar Person to schedule a new patient visit with Dr Delton See to establish for general cardiology care

## 2014-07-24 ENCOUNTER — Encounter: Payer: Self-pay | Admitting: Internal Medicine

## 2014-07-24 ENCOUNTER — Ambulatory Visit (INDEPENDENT_AMBULATORY_CARE_PROVIDER_SITE_OTHER): Payer: BC Managed Care – PPO | Admitting: Internal Medicine

## 2014-07-24 VITALS — BP 168/88 | HR 76 | Ht 75.0 in | Wt 252.0 lb

## 2014-07-24 DIAGNOSIS — I4819 Other persistent atrial fibrillation: Secondary | ICD-10-CM

## 2014-07-24 DIAGNOSIS — I481 Persistent atrial fibrillation: Secondary | ICD-10-CM

## 2014-07-24 DIAGNOSIS — I519 Heart disease, unspecified: Secondary | ICD-10-CM

## 2014-07-24 DIAGNOSIS — D62 Acute posthemorrhagic anemia: Secondary | ICD-10-CM

## 2014-07-24 DIAGNOSIS — K260 Acute duodenal ulcer with hemorrhage: Secondary | ICD-10-CM

## 2014-07-24 DIAGNOSIS — I872 Venous insufficiency (chronic) (peripheral): Secondary | ICD-10-CM

## 2014-07-24 LAB — CBC WITH DIFFERENTIAL/PLATELET
BASOS ABS: 0.1 10*3/uL (ref 0.0–0.1)
Basophils Relative: 0.7 % (ref 0.0–3.0)
EOS ABS: 0.2 10*3/uL (ref 0.0–0.7)
Eosinophils Relative: 2.7 % (ref 0.0–5.0)
HEMATOCRIT: 34.9 % — AB (ref 39.0–52.0)
Hemoglobin: 11 g/dL — ABNORMAL LOW (ref 13.0–17.0)
LYMPHS ABS: 0.8 10*3/uL (ref 0.7–4.0)
Lymphocytes Relative: 10.8 % — ABNORMAL LOW (ref 12.0–46.0)
MCHC: 31.5 g/dL (ref 30.0–36.0)
MCV: 89.3 fl (ref 78.0–100.0)
MONO ABS: 0.7 10*3/uL (ref 0.1–1.0)
Monocytes Relative: 9.3 % (ref 3.0–12.0)
NEUTROS PCT: 76.5 % (ref 43.0–77.0)
Neutro Abs: 5.6 10*3/uL (ref 1.4–7.7)
PLATELETS: 306 10*3/uL (ref 150.0–400.0)
RBC: 3.91 Mil/uL — ABNORMAL LOW (ref 4.22–5.81)
RDW: 17 % — AB (ref 11.5–15.5)
WBC: 7.3 10*3/uL (ref 4.0–10.5)

## 2014-07-24 LAB — HEPATIC FUNCTION PANEL
ALBUMIN: 3 g/dL — AB (ref 3.5–5.2)
ALT: 20 U/L (ref 0–53)
AST: 18 U/L (ref 0–37)
Alkaline Phosphatase: 72 U/L (ref 39–117)
Bilirubin, Direct: 0 mg/dL (ref 0.0–0.3)
Total Bilirubin: 0.7 mg/dL (ref 0.2–1.2)
Total Protein: 6.6 g/dL (ref 6.0–8.3)

## 2014-07-24 LAB — BASIC METABOLIC PANEL
BUN: 12 mg/dL (ref 6–23)
CHLORIDE: 105 meq/L (ref 96–112)
CO2: 27 mEq/L (ref 19–32)
CREATININE: 1.2 mg/dL (ref 0.4–1.5)
Calcium: 8.7 mg/dL (ref 8.4–10.5)
GFR: 62.39 mL/min (ref >60.00)
GLUCOSE: 107 mg/dL — AB (ref 70–99)
POTASSIUM: 3.8 meq/L (ref 3.5–5.1)
Sodium: 140 mEq/L (ref 135–145)

## 2014-07-24 LAB — TSH: TSH: 1.63 u[IU]/mL (ref 0.35–4.50)

## 2014-07-24 LAB — T4, FREE: FREE T4: 1.27 ng/dL (ref 0.60–1.60)

## 2014-07-24 LAB — MAGNESIUM: MAGNESIUM: 2.1 mg/dL (ref 1.5–2.5)

## 2014-07-24 MED ORDER — AMIODARONE HCL 200 MG PO TABS: 200.0000 mg | ORAL_TABLET | Freq: Every day | ORAL | Status: DC

## 2014-07-24 MED ORDER — FUROSEMIDE 40 MG PO TABS: ORAL_TABLET | ORAL | Status: DC

## 2014-07-24 MED ORDER — LISINOPRIL 10 MG PO TABS: 10.0000 mg | ORAL_TABLET | Freq: Every day | ORAL | Status: DC

## 2014-07-24 NOTE — Patient Instructions (Signed)
Your physician recommends that you schedule a follow-up appointment in: 3 months with Dr Johney Frame  Your physician recommends that you return for lab work today: BMP/MAG/LFT's/TSH/T4/CBC  Your physician has recommended you make the following change in your medication:  1) Decrease Amiodarone to 200mg  daily 2) Increase Furosemide to twice daily for 3 days, then back to daily 3) Increase Lisinopril 10mg  daily   Low-Sodium Eating Plan Sodium raises blood pressure and causes water to be held in the body. Getting less sodium from food will help lower your blood pressure, reduce any swelling, and protect your heart, liver, and kidneys. We get sodium by adding salt (sodium chloride) to food. Most of our sodium comes from canned, boxed, and frozen foods. Restaurant foods, fast foods, and pizza are also very high in sodium. Even if you take medicine to lower your blood pressure or to reduce fluid in your body, getting less sodium from your food is important. WHAT IS MY PLAN? Most people should limit their sodium intake to 2,300 mg a day. Your health care provider recommends that you limit your sodium intake to __________ a day.  WHAT DO I NEED TO KNOW ABOUT THIS EATING PLAN? For the low-sodium eating plan, you will follow these general guidelines:  Choose foods with a % Daily Value for sodium of less than 5% (as listed on the food label).   Use salt-free seasonings or herbs instead of table salt or sea salt.   Check with your health care provider or pharmacist before using salt substitutes.   Eat fresh foods.  Eat more vegetables and fruits.  Limit canned vegetables. If you do use them, rinse them well to decrease the sodium.   Limit cheese to 1 oz (28 g) per day.   Eat lower-sodium products, often labeled as "lower sodium" or "no salt added."  Avoid foods that contain monosodium glutamate (MSG). MSG is sometimes added to [place] food and some canned foods.  Check food labels  (Nutrition Facts labels) on foods to learn how much sodium is in one serving.  Eat more home-cooked food and less restaurant, buffet, and fast food.  When eating at a restaurant, ask that your food be prepared with less salt or none, if possible.  HOW DO I READ FOOD LABELS FOR SODIUM INFORMATION? The Nutrition Facts label lists the amount of sodium in one serving of the food. If you eat more than one serving, you must multiply the listed amount of sodium by the number of servings. Food labels may also identify foods as:  Sodium free--Less than 5 mg in a serving.  Very low sodium--35 mg or less in a serving.  Low sodium--140 mg or less in a serving.  Light in sodium--50% less sodium in a serving. For example, if a food that usually has 300 mg of sodium is changed to become light in sodium, it will have 150 mg of sodium.  Reduced sodium--25% less sodium in a serving. For example, if a food that usually has 400 mg of sodium is changed to reduced sodium, it will have 300 mg of sodium. WHAT FOODS CAN I EAT? Grains Low-sodium cereals, including oats, puffed wheat and rice, and shredded wheat cereals. Low-sodium crackers. Unsalted rice and pasta. Lower-sodium bread.  Vegetables Frozen or fresh vegetables. Low-sodium or reduced-sodium canned vegetables. Low-sodium or reduced-sodium tomato sauce and paste. Low-sodium or reduced-sodium tomato and vegetable juices.  Fruits Fresh, frozen, and canned fruit. Fruit juice.  Meat and Other Protein Products Low-sodium canned tuna  and salmon. Fresh or frozen meat, poultry, seafood, and fish. Lamb. Unsalted nuts. Dried beans, peas, and lentils without added salt. Unsalted canned beans. Homemade soups without salt. Eggs.  Dairy Milk. Soy milk. Ricotta cheese. Low-sodium or reduced-sodium cheeses. Yogurt.  Condiments Fresh and dried herbs and spices. Salt-free seasonings. Onion and garlic powders. Low-sodium varieties of mustard and ketchup. Lemon  juice.  Fats and Oils Reduced-sodium salad dressings. Unsalted butter.  Other Unsalted popcorn and pretzels.  The items listed above may not be a complete list of recommended foods or beverages. Contact your dietitian for more options. WHAT FOODS ARE NOT RECOMMENDED? Grains Instant hot cereals. Bread stuffing, pancake, and biscuit mixes. Croutons. Seasoned rice or pasta mixes. Noodle soup cups. Boxed or frozen macaroni and cheese. Self-rising flour. Regular salted crackers. Vegetables Regular canned vegetables. Regular canned tomato sauce and paste. Regular tomato and vegetable juices. Frozen vegetables in sauces. Salted french fries. Olives. Rosita FirePickles. Relishes. Sauerkraut. Salsa. Meat and Other Protein Products Salted, canned, smoked, spiced, or pickled meats, seafood, or fish. Bacon, ham, sausage, hot dogs, corned beef, chipped beef, and packaged luncheon meats. Salt pork. Jerky. Pickled herring. Anchovies, regular canned tuna, and sardines. Salted nuts. Dairy Processed cheese and cheese spreads. Cheese curds. Blue cheese and cottage cheese. Buttermilk.  Condiments Onion and garlic salt, seasoned salt, table salt, and sea salt. Canned and packaged gravies. Worcestershire sauce. Tartar sauce. Barbecue sauce. Teriyaki sauce. Soy sauce, including reduced sodium. Steak sauce. Fish sauce. Oyster sauce. Cocktail sauce. Horseradish. Regular ketchup and mustard. Meat flavorings and tenderizers. Bouillon cubes. Hot sauce. Tabasco sauce. Marinades. Taco seasonings. Relishes. Fats and Oils Regular salad dressings. Salted butter. Margarine. Ghee. Bacon fat.  Other Potato and tortilla chips. Corn chips and puffs. Salted popcorn and pretzels. Canned or dried soups. Pizza. Frozen entrees and pot pies.  The items listed above may not be a complete list of foods and beverages to avoid. Contact your dietitian for more information. Document Released: 03/05/2002 Document Revised: 09/18/2013 Document  Reviewed: 07/18/2013 [facility] Patient Information 2015 [place], [place]. This information is not intended to replace advice given to you by your health care provider. Make sure you discuss any questions you have with your health care provider.

## 2014-07-25 DIAGNOSIS — I872 Venous insufficiency (chronic) (peripheral): Secondary | ICD-10-CM | POA: Insufficient documentation

## 2014-07-25 DIAGNOSIS — I519 Heart disease, unspecified: Secondary | ICD-10-CM | POA: Insufficient documentation

## 2014-07-25 NOTE — Progress Notes (Signed)
Primary Care Physician: Lorenda Peck, MD Referring Physician : Dr. Champ Mungo is a 68 y.o. male presents today for EP follow-up.  He is maintaining sinus rhythm and continues to have slowly improving CHF.  His R leg edema is improving.  His exercise tolerance is also improving.  He denies bleeding.  Today, he denies symptoms of palpitations, chest pain, shortness of breath, orthopnea, PND,  dizziness, presyncope, syncope, or neurologic sequela. The patient is tolerating medications without difficulties and is otherwise without complaint today.   Past Medical History  Diagnosis Date  . Hypertension   . A-fib     a. Officially dx 05/27/14, sx may have been going on longer.  . Eczema   . Dysrhythmia   . Acute duodenal ulcer with hemorrhage 06/10/2014  . Acute blood loss anemia   . Atrial fibrillation, persistent   . Coagulopathy    Past Surgical History  Procedure Laterality Date  . Hernia repair  1993    [facility]  . Tee without cardioversion N/A 06/05/2014    Procedure: TRANSESOPHAGEAL ECHOCARDIOGRAM (TEE);  Surgeon: Quintella Reichert, MD;  Location: [facility] ENDOSCOPY;  Service: Cardiovascular;  Laterality: N/A;  . Cardioversion N/A 06/05/2014    Procedure: CARDIOVERSION;  Surgeon: Quintella Reichert, MD;  Location: [facility] ENDOSCOPY;  Service: Cardiovascular;  Laterality: N/A;  . Esophagogastroduodenoscopy N/A 06/07/2014    Procedure: ESOPHAGOGASTRODUODENOSCOPY (EGD);  Surgeon: Theda Belfast, MD;  Location: [facility] ENDOSCOPY;  Service: Endoscopy;  Laterality: N/A;  . Esophagogastroduodenoscopy N/A 06/12/2014    Procedure: ESOPHAGOGASTRODUODENOSCOPY (EGD);  Surgeon: Iva Boop, MD;  Location: [facility] ENDOSCOPY;  Service: Endoscopy;  Laterality: N/A;    Current Outpatient Prescriptions  Medication Sig Dispense Refill  . amiodarone (PACERONE) 200 MG tablet Take 1 tablet (200 mg total) by mouth daily.      . carvedilol (COREG) 6.25 MG tablet Take 1 tablet (6.25 mg total) by  mouth 2 (two) times daily with a meal.  60 tablet  6  . Dermatological Products, Misc. (ELETONE EX) Apply 1 application topically 2 (two) times daily as needed (eczema).      . ferrous sulfate 325 (65 FE) MG tablet Take 1 tablet (325 mg total) by mouth 2 (two) times daily with a meal.    3  . furosemide (LASIX) 40 MG tablet One tablet by mouth daily and as needed for swelling  45 tablet  11  . lisinopril (PRINIVIL,ZESTRIL) 10 MG tablet Take 1 tablet (10 mg total) by mouth daily.  30 tablet  11  . pantoprazole (PROTONIX) 40 MG tablet Take 1 tablet (40 mg total) by mouth daily.  30 tablet  11   No current facility-administered medications for this visit.    No Known Allergies  History   Social History  . Marital Status: Single    Spouse Name: N/A    Number of Children: N/A  . Years of Education: N/A   Occupational History  . Not on file.   Social History Main Topics  . Smoking status: Never Smoker   . Smokeless tobacco: Never Used  . Alcohol Use: No  . Drug Use: No  . Sexual Activity: Not Currently   Other Topics Concern  . Not on file   Social History Narrative  . No narrative on file    Family History  Problem Relation Age of Onset  . Depression Father   . Heart disease Father     Some sort of  heart issue at end of life - possibly CAD  . Multiple sclerosis Mother   . Cancer Mother     Tumor in kidney, spread to bones    ROS- All systems are reviewed and negative except as per the HPI above  Physical Exam: Filed Vitals:   07/24/14 0943  BP: 168/88  Pulse: 76  Height: 6\' 3"  (1.905 m)  Weight: 252 lb (114.306 kg)    GEN- The patient is well appearing, alert and oriented x 3 today.   Head- normocephalic, atraumatic Eyes-  Sclera clear, conjunctiva pink Ears- hearing intact Oropharynx- clear Neck- supple , + JVD Lungs- few bibasilar rales , normal work of breathing Heart- Regular rate and rhythm, no murmurs, rubs or gallops, PMI not laterally displaced GI-  soft, NT, ND, + BS Extremities- left lower extremity  nml, RLE with significantly improved edema, + venous insufficiency observed MS- no significant deformity or atroph Skin- no rash or lesiony Psych- euthymic mood, full affect Neuro- strength and sensation are intact  EKG-SR with LBBB and PVCs  Assessment and Plan: 1. Persistent afib Doing well with amiodarone Reduce to 200mg  daily Will ask GI to assess when we can restart anticoagulation when the patient sees GI in several weeks.  LFTs/TFTs  2. 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 GI Bleed with blood loss anemia  Continue protonix Continue to avoid anticoagulation for now--> he sees Dr Leone PayorGessner in several weeks.  Hopefully we can restart anticoagulation going forward On Iron. Repeat CBC and BMET today  4. Swelling of RLE/ venous insufficiniency No DVT by recent doppler Support hose I offered referral to vein specialists which he declines at this time  Follow-up with GI as scheduled Refer to Dr Delton SeeNelson for general cardiology care  Return to see me in 3 months

## 2014-07-30 ENCOUNTER — Telehealth: Payer: Self-pay | Admitting: Internal Medicine

## 2014-07-30 NOTE — Telephone Encounter (Signed)
New message  ° ° °Patient calling back to speak with nurse  °

## 2014-08-01 NOTE — Telephone Encounter (Signed)
Left message on machine for patient to return my call 

## 2014-08-07 ENCOUNTER — Encounter: Payer: Self-pay | Admitting: Gastroenterology

## 2014-08-09 ENCOUNTER — Encounter: Payer: Self-pay | Admitting: Internal Medicine

## 2014-08-09 ENCOUNTER — Ambulatory Visit (INDEPENDENT_AMBULATORY_CARE_PROVIDER_SITE_OTHER): Payer: BC Managed Care – PPO | Admitting: Internal Medicine

## 2014-08-09 VITALS — BP 148/82 | HR 76 | Ht 75.0 in | Wt 252.4 lb

## 2014-08-09 DIAGNOSIS — I4891 Unspecified atrial fibrillation: Secondary | ICD-10-CM

## 2014-08-09 DIAGNOSIS — D62 Acute posthemorrhagic anemia: Secondary | ICD-10-CM

## 2014-08-09 DIAGNOSIS — K260 Acute duodenal ulcer with hemorrhage: Secondary | ICD-10-CM

## 2014-08-09 NOTE — Patient Instructions (Signed)
It has been recommended to you by your physician that you have a(n) colonoscopy completed. Per your request, we did not schedule the procedure(s) today. Please contact our office at [phone] should you decide to have the procedure completed.  Follow up with your cardiologist about taking blood thinners.   Stay on your iron until one month after your iron returns to normal.   Follow up with Dr. Leone Payor as needed.   I appreciate the opportunity to care for you.

## 2014-08-09 NOTE — Progress Notes (Signed)
   Subjective:    Patient ID: Bryan Moran, male    DOB: [birth year], 68 y.o.   MRN: [id number]  HPI The patient is here for hospital follow-up. He had an acutely bleeding duodenal ulcer in the setting of anticoagulation therapy that was treated with coil embolization. He has not had further signs of bleeding. He is not on anticoagulation. He has been in sinus rhythm and not atrial fibrillation. He did go over some time after the hospitalization without his PPI but Dr. Johney Frame started it in late October. The patient is asking if it would be okay to take a baby aspirin. It's as well. We went through many questions about his heart and why he has atrial fibrillation and signs and symptoms of heart failure.  Medications, allergies, past medical history, past surgical history, family history and social history are reviewed and updated in the EMR.  Review of Systems As above, he is exercising and not short of breath. He is overall feeling much better. He remains on iron therapy.    Objective:   Physical Exam Robust elderly white man in no acute distress  Lab Results  Component Value Date   WBC 7.3 07/24/2014   HGB 11.0* 07/24/2014   HCT 34.9* 07/24/2014   MCV 89.3 07/24/2014   PLT 306.0 07/24/2014          Assessment & Plan:   1. Acute duodenal ulcer with hemorrhage   2. Acute blood loss anemia   3. Atrial fibrillation    I spent 15-20 minutes talking to this man about his problems. I think it is okay for him to resume anticoagulation therapy if that is indicated by cardiology. He is reluctant to do so. I explained that typically it would be a full dose aspirin and not a baby aspirin and that aspirin is certainly more ulcerogenic then anticoagulants themselves which are generally not. He should stay on pantoprazole or another PPI for ever. He is H. Pylori negative.  I recommended a screening colonoscopy or other forms of colon cancer screening and he wants to think about it he is not ready  to schedule this. He will see me as needed.  I have told him that once his hemoglobin remains normal he should stay on iron for one month after that and then stop.   Current outpatient prescriptions: amiodarone (PACERONE) 200 MG tablet, Take 1 tablet (200 mg total) by mouth daily., Disp: , Rfl: ;  carvedilol (COREG) 6.25 MG tablet, Take 1 tablet (6.25 mg total) by mouth 2 (two) times daily with a meal., Disp: 60 tablet, Rfl: 6;  Dermatological Products, Misc. (ELETONE EX), Apply 1 application topically 2 (two) times daily as needed (eczema)., Disp: , Rfl:  ferrous sulfate 325 (65 FE) MG tablet, Take 1 tablet (325 mg total) by mouth 2 (two) times daily with a meal., Disp: , Rfl: 3;  furosemide (LASIX) 40 MG tablet, One tablet by mouth daily and as needed for swelling, Disp: 45 tablet, Rfl: 11;  lisinopril (PRINIVIL,ZESTRIL) 10 MG tablet, Take 1 tablet (10 mg total) by mouth daily., Disp: 30 tablet, Rfl: 11 pantoprazole (PROTONIX) 40 MG tablet, Take 1 tablet (40 mg total) by mouth daily., Disp: 30 tablet, Rfl: 11  CC: ROBERTS, Vernie Ammons, MD

## 2014-08-16 ENCOUNTER — Ambulatory Visit: Payer: BC Managed Care – PPO | Admitting: Cardiology

## 2014-08-28 NOTE — Telephone Encounter (Signed)
I have left several messages for patient in regards to starting NOAC.  Per note form GI 08/09/14:   3. Atrial fibrillation    I spent 15-20 minutes talking to this man about his problems. I think it is okay for him to resume anticoagulation therapy if that is indicated by cardiology. He is reluctant to do so. I explained that typically it would be a full dose aspirin and not a baby aspirin and that aspirin is certainly more ulcerogenic then anticoagulants themselves which are generally not. he is not sure he wants to try this again at this time.  I am not sure this is something he is going to do.

## 2014-09-10 ENCOUNTER — Ambulatory Visit: Payer: BC Managed Care – PPO | Admitting: Cardiology

## 2014-09-11 ENCOUNTER — Ambulatory Visit: Payer: BC Managed Care – PPO | Admitting: Cardiology

## 2014-10-10 ENCOUNTER — Encounter (HOSPITAL_COMMUNITY): Payer: Self-pay | Admitting: Cardiology

## 2014-10-24 ENCOUNTER — Encounter: Payer: Self-pay | Admitting: Internal Medicine

## 2014-10-24 ENCOUNTER — Ambulatory Visit (INDEPENDENT_AMBULATORY_CARE_PROVIDER_SITE_OTHER): Payer: BLUE CROSS/BLUE SHIELD | Admitting: Internal Medicine

## 2014-10-24 VITALS — BP 182/76 | HR 78 | Ht 75.0 in | Wt 257.0 lb

## 2014-10-24 DIAGNOSIS — I519 Heart disease, unspecified: Secondary | ICD-10-CM

## 2014-10-24 DIAGNOSIS — I481 Persistent atrial fibrillation: Secondary | ICD-10-CM

## 2014-10-24 DIAGNOSIS — I110 Hypertensive heart disease with heart failure: Secondary | ICD-10-CM

## 2014-10-24 DIAGNOSIS — I119 Hypertensive heart disease without heart failure: Secondary | ICD-10-CM | POA: Insufficient documentation

## 2014-10-24 DIAGNOSIS — I4819 Other persistent atrial fibrillation: Secondary | ICD-10-CM

## 2014-10-24 DIAGNOSIS — I872 Venous insufficiency (chronic) (peripheral): Secondary | ICD-10-CM

## 2014-10-24 DIAGNOSIS — D62 Acute posthemorrhagic anemia: Secondary | ICD-10-CM

## 2014-10-24 DIAGNOSIS — I509 Heart failure, unspecified: Secondary | ICD-10-CM

## 2014-10-24 LAB — CBC WITH DIFFERENTIAL/PLATELET
Basophils Absolute: 0 10*3/uL (ref 0.0–0.1)
Basophils Relative: 0.5 % (ref 0.0–3.0)
EOS PCT: 2.9 % (ref 0.0–5.0)
Eosinophils Absolute: 0.2 10*3/uL (ref 0.0–0.7)
HEMATOCRIT: 46.9 % (ref 39.0–52.0)
Hemoglobin: 15.7 g/dL (ref 13.0–17.0)
Lymphocytes Relative: 14.3 % (ref 12.0–46.0)
Lymphs Abs: 1.2 10*3/uL (ref 0.7–4.0)
MCHC: 33.5 g/dL (ref 30.0–36.0)
MCV: 88.9 fl (ref 78.0–100.0)
MONO ABS: 0.7 10*3/uL (ref 0.1–1.0)
Monocytes Relative: 8.5 % (ref 3.0–12.0)
NEUTROS ABS: 6.3 10*3/uL (ref 1.4–7.7)
Neutrophils Relative %: 73.8 % (ref 43.0–77.0)
Platelets: 246 10*3/uL (ref 150.0–400.0)
RBC: 5.27 Mil/uL (ref 4.22–5.81)
RDW: 17.7 % — ABNORMAL HIGH (ref 11.5–15.5)
WBC: 8.5 10*3/uL (ref 4.0–10.5)

## 2014-10-24 LAB — T4, FREE: FREE T4: 1 ng/dL (ref 0.60–1.60)

## 2014-10-24 LAB — TSH: TSH: 5.55 u[IU]/mL — ABNORMAL HIGH (ref 0.35–4.50)

## 2014-10-24 LAB — T3, FREE: T3 FREE: 5.2 pg/mL — AB (ref 2.3–4.2)

## 2014-10-24 MED ORDER — LISINOPRIL 20 MG PO TABS: 20.0000 mg | ORAL_TABLET | Freq: Every day | ORAL | Status: DC

## 2014-10-24 NOTE — Progress Notes (Signed)
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  He is maintaining sinus rhythm on amiodarone and feels very well, walking up to 2 miles a day. Marland Kitchen  His R leg edema hs resolved. No further gi issues with bleeding. He saw his GI doctor a few months back and  He thought it would be ok to restart NOAC, however pt  is adamant that he will not take any anticoagulants due to GI bleed in the fall on eliquis. He would consider a baby asa, but has a chads2vasc score of at least 3 and that would not be sufficient to be protective for stroke. He feels like he has been maintaining SR, with h/o TMC, and echo will need to be repeated to see if improvement in SR.   Today, pt denies any chest pain, dyspnea, dizziness, presyncope, syncope, or neurologic sequela. The patient is tolerating medications without difficulties and is otherwise without complaint today.   Past Medical History  Diagnosis Date  . Hypertension   . A-fib     a. Officially dx 05/27/14, sx may have been going on longer.  . Eczema   . Dysrhythmia   . Acute duodenal ulcer with hemorrhage 06/10/2014  . Acute blood loss anemia   . Atrial fibrillation, persistent   . Coagulopathy   . Venous insufficiency    Past Surgical History  Procedure Laterality Date  . Hernia repair  1993    [facility]  . Tee without cardioversion N/A 06/05/2014    Procedure: TRANSESOPHAGEAL ECHOCARDIOGRAM (TEE);  Surgeon: Quintella Reichert, MD;  Location: [facility] ENDOSCOPY;  Service: Cardiovascular;  Laterality: N/A;  . Cardioversion N/A 06/05/2014    Procedure: CARDIOVERSION;  Surgeon: Quintella Reichert, MD;  Location: [facility] ENDOSCOPY;  Service: Cardiovascular;  Laterality: N/A;  . Esophagogastroduodenoscopy N/A 06/07/2014    Procedure: ESOPHAGOGASTRODUODENOSCOPY (EGD);  Surgeon: Theda Belfast, MD;  Location: [facility] ENDOSCOPY;  Service:  Endoscopy;  Laterality: N/A;  . Esophagogastroduodenoscopy N/A 06/12/2014    Procedure: ESOPHAGOGASTRODUODENOSCOPY (EGD);  Surgeon: Iva Boop, MD;  Location: [facility] ENDOSCOPY;  Service: Endoscopy;  Laterality: N/A;    Current Outpatient Prescriptions  Medication Sig Dispense Refill  . amiodarone (PACERONE) 200 MG tablet Take 1 tablet (200 mg total) by mouth daily.    . carvedilol (COREG) 6.25 MG tablet Take 1 tablet (6.25 mg total) by mouth 2 (two) times daily with a meal. 60 tablet 6  . Dermatological Products, Misc. (ELETONE EX) Apply 1 application topically 2 (two) times daily as needed (eczema).    . ferrous sulfate 325 (65 FE) MG tablet Take 1 tablet (325 mg total) by mouth 2 (two) times daily with a meal.  3  . furosemide (LASIX) 40 MG tablet One tablet by mouth daily and as needed for swelling (Patient taking differently: One tablet by mouth daily and you can take one extra tablet by mouth daily as needed for swelling) 45 tablet 11  . lisinopril (PRINIVIL,ZESTRIL) 20 MG tablet Take 1 tablet (20 mg total) by mouth daily. 90 tablet 3  . pantoprazole (PROTONIX) 40 MG tablet Take 1 tablet (40 mg total) by mouth daily. 30 tablet 11   No current facility-administered medications for this visit.    No Known Allergies  History   Social History  . Marital Status: Single    Spouse Name: N/A  Number of Children: N/A  . Years of Education: N/A   Occupational History  . Not on file.   Social History Main Topics  . Smoking status: Never Smoker   . Smokeless tobacco: Never Used  . Alcohol Use: No  . Drug Use: No  . Sexual Activity: Not Currently   Other Topics Concern  . Not on file   Social History Narrative    Family History  Problem Relation Age of Onset  . Depression Father   . Heart disease Father     Some sort of heart issue at end of life - possibly CAD  . Multiple sclerosis Mother   . Cancer Mother     Tumor in kidney, spread to bones    ROS- All systems are  reviewed and negative except as per the HPI above  Physical Exam: Filed Vitals:   10/24/14 1107  BP: 182/76  Pulse: 78  Height:  (1.905 m)  Weight: 257 lb (116.574 kg)    GEN- The patient is well appearing, alert and oriented x 3 today.   Head- normocephalic, atraumatic Eyes-  Sclera clear, conjunctiva pink Ears- hearing intact Oropharynx- clear Neck- clear, normal work of breathing Heart- Regular rate and rhythm, no murmurs, rubs or gallops, PMI not laterally displaced GI- soft, NT, ND, + BS Extremities- no significant LEE. MS- no significant deformity or atroph Skin- no rash or lesiony Psych- euthymic mood, full affect Neuro- strength and sensation are intact  EKG-SR with LBBB, with PAC,PVC  Assessment and Plan: 1. Persistent afib Doing well with amiodarone Continue  daily. Liver/throid panel today. Today,I had a long discussion with pt regarding his risk of stroke with h/o afib and chads2vasc sore of at least 3.  I am concerned about his embolic risks.  This patients CHA2DS2-VASc Score and unadjusted Ischemic Stroke Rate (% per year) is equal to 3.2 % stroke rate/year from a score of 3  Today, I discussed Coumadin as well as novel anticoagulants including Pradaxa, Xarelto, Savaysa, and Eliquis today as indicated for risk reduction in stroke and systemic emboli with nonvalvular atrial fibrillation.  Risks, benefits, and alternatives to each of these drugs were discussed at length today.  He is clear in his decision to decline anticoagulation.  I also offered left atrial appendage occlusion device placement as an alternative.  Though he may be willing to consider this is the future, he is not ready to do so at this time.  I am very concerned that he is at high risk of decompensation/ stroke/ recurrent CHF with AFib.  Unfortunately, he is somewhat in denial accepting his risk.  Above score calculated as 1 point each if present [CHF, HTN, DM, Vascular=MI/PAD/Aortic Plaque,  Age if 65-74, or Male] Above score calculated as 2 points each if present [Age > 75, or Stroke/TIA/TE]  2. 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  Hopefully his EF has improved with sinus rhythm.   I have referred to Dr Delton See for CHF management-->appointment pending 2/2. Continue lasix 2 gram sodium restriction Increase lisinopril to  daily for elevated BP today (repeat BP by MD is 158/80) Check bmet today  3. H/O GI Bleed with blood loss anemia  Continue protonix CBC today. Although Dr. Leone Payor thought he would be ok to resume NOAC, pt is very concerned re another GI bleed.  Risks discussion of stroke vs bleeding as above.  4. Swelling of RLE/ venous insufficiniency Resolved No DVT by  doppler Support hose  as needed  5. Hypertensive cardiovascular disease with CHF Uncontrolled BP.  Will increase lisinopril.  Return to afib clinic in 3 months. Follow-up with Dr Delton See soon.

## 2014-10-24 NOTE — Progress Notes (Deleted)
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  He is maintaining sinus rhythm on amiodarone  and feels very well, walking up to 2 miles a day. Marland Kitchen  His R leg edema hs resolved. No further gi issues with bleeding. He saw his GI doctor a few months back and  He thought it would be ok to restart NOAC, however pt  is adamant that he will not take any blood thinners  due to GI bleed in the fall on eliquis. He would consider a baby asa, but has a chads2vasc score of at least 3 and that would not be sufficient to be protective for stroke. He feels like he has been maintaining SR, with h/o TMC, and echo will need to be repeated to see if improvement in SR.   Today, pt denies any chest pain, dyspnea, dizziness, presyncope, syncope, or neurologic sequela. The patient is tolerating medications without difficulties and is otherwise without complaint today.   Past Medical History  Diagnosis Date  . Hypertension   . A-fib     a. Officially dx 05/27/14, sx may have been going on longer.  . Eczema   . Dysrhythmia   . Acute duodenal ulcer with hemorrhage 06/10/2014  . Acute blood loss anemia   . Atrial fibrillation, persistent   . Coagulopathy   . Venous insufficiency    Past Surgical History  Procedure Laterality Date  . Hernia repair  1993    [facility]  . Tee without cardioversion N/A 06/05/2014    Procedure: TRANSESOPHAGEAL ECHOCARDIOGRAM (TEE);  Surgeon: Quintella Reichert, MD;  Location: [facility] ENDOSCOPY;  Service: Cardiovascular;  Laterality: N/A;  . Cardioversion N/A 06/05/2014    Procedure: CARDIOVERSION;  Surgeon: Quintella Reichert, MD;  Location: [facility] ENDOSCOPY;  Service: Cardiovascular;  Laterality: N/A;  . Esophagogastroduodenoscopy N/A 06/07/2014    Procedure: ESOPHAGOGASTRODUODENOSCOPY (EGD);  Surgeon: Theda Belfast, MD;  Location: [facility] ENDOSCOPY;  Service: Endoscopy;  Laterality: N/A;  .  Esophagogastroduodenoscopy N/A 06/12/2014    Procedure: ESOPHAGOGASTRODUODENOSCOPY (EGD);  Surgeon: Iva Boop, MD;  Location: [facility] ENDOSCOPY;  Service: Endoscopy;  Laterality: N/A;    Current Outpatient Prescriptions  Medication Sig Dispense Refill  . amiodarone (PACERONE) 200 MG tablet Take 1 tablet (200 mg total) by mouth daily.    . carvedilol (COREG) 6.25 MG tablet Take 1 tablet (6.25 mg total) by mouth 2 (two) times daily with a meal. 60 tablet 6  . Dermatological Products, Misc. (ELETONE EX) Apply 1 application topically 2 (two) times daily as needed (eczema).    . ferrous sulfate 325 (65 FE) MG tablet Take 1 tablet (325 mg total) by mouth 2 (two) times daily with a meal.  3  . furosemide (LASIX) 40 MG tablet One tablet by mouth daily and as needed for swelling (Patient taking differently: One tablet by mouth daily and you can take one extra tablet by mouth daily as needed for swelling) 45 tablet 11  . lisinopril (PRINIVIL,ZESTRIL) 20 MG tablet Take 1 tablet (20 mg total) by mouth daily. 90 tablet 3  . pantoprazole (PROTONIX) 40 MG tablet Take 1 tablet (40 mg total) by mouth daily. 30 tablet 11   No current facility-administered medications for this visit.    No Known Allergies  History   Social History  . Marital Status: Single    Spouse Name: N/A  Number of Children: N/A  . Years of Education: N/A   Occupational History  . Not on file.   Social History Main Topics  . Smoking status: Never Smoker   . Smokeless tobacco: Never Used  . Alcohol Use: No  . Drug Use: No  . Sexual Activity: Not Currently   Other Topics Concern  . Not on file   Social History Narrative    Family History  Problem Relation Age of Onset  . Depression Father   . Heart disease Father     Some sort of heart issue at end of life - possibly CAD  . Multiple sclerosis Mother   . Cancer Mother     Tumor in kidney, spread to bones    ROS- All systems are reviewed and negative except as per  the HPI above  Physical Exam: Filed Vitals:   10/24/14 1107  BP: 182/76  Pulse: 78  Height:  (1.905 m)  Weight: 257 lb (116.574 kg)    GEN- The patient is well appearing, alert and oriented x 3 today.   Head- normocephalic, atraumatic Eyes-  Sclera clear, conjunctiva pink Ears- hearing intact Oropharynx- clear Neck- clear, normal work of breathing Heart- Regular rate and rhythm, no murmurs, rubs or gallops, PMI not laterally displaced GI- soft, NT, ND, + BS Extremities- no significant LEE. MS- no significant deformity or atroph Skin- no rash or lesiony Psych- euthymic mood, full affect Neuro- strength and sensation are intact  EKG-SR with LBBB, with PAC,PVC  Assessment and Plan: 1. Persistent afib Doing well with amiodarone Continue  daily. Liver/throid panel today. Long discussion with pt regarding his risk of stroke with h/o afib and chads2vasc sore of at least 3. Pt is somewhat in denial accepting his risk. Feels all of his prior symptoms were from anemia/gi bleed. Currently defers anticoagulant, and baby asa would not properly address stroke risk. May be a watchman devise candidate at some point in the future. Discussed with pt,  with lukewarm reception from the patient.  2. 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 Continue lasix 2 gram sodium restriction Increase lisinopril to  daily for elevated BP, recheck at 150/80. Check bmet today Echo to f/u on TMC,  now maintaining SR.  3. H/O GI Bleed with blood loss anemia  Continue protonix CBC today. Although Dr. Leone Payor thought he would be ok to resume NOAC, pt is very concerned re another GI bleed.  4. Swelling of RLE/ venous insufficiniency Resolved No DVT by  doppler Support hose as needed    Return to safib clinic in 3 months.

## 2014-10-24 NOTE — Patient Instructions (Signed)
Your physician recommends that you schedule a follow-up appointment in: 3 months with Rudi Coco, NP  Your physician recommends that you return for lab work today: BMP/CBC/TSH/T3/T4/liver  Your physician has requested that you have an echocardiogram. Echocardiography is a painless test that uses sound waves to create images of your heart. It provides your doctor with information about the size and shape of your heart and how well your heart's chambers and valves are working. This procedure takes approximately one hour. There are no restrictions for this procedure.  Your physician has recommended you make the following change in your medication:  1) Increase Lisinopril to 20 mg daily

## 2014-10-25 LAB — HEPATIC FUNCTION PANEL
ALBUMIN: 4 g/dL (ref 3.5–5.2)
ALK PHOS: 75 U/L (ref 39–117)
ALT: 13 U/L (ref 0–53)
AST: 16 U/L (ref 0–37)
BILIRUBIN DIRECT: 0.2 mg/dL (ref 0.0–0.3)
Total Bilirubin: 0.9 mg/dL (ref 0.2–1.2)
Total Protein: 7.2 g/dL (ref 6.0–8.3)

## 2014-10-25 LAB — BASIC METABOLIC PANEL
BUN: 16 mg/dL (ref 6–23)
CO2: 23 mEq/L (ref 19–32)
Calcium: 9.3 mg/dL (ref 8.4–10.5)
Chloride: 107 mEq/L (ref 96–112)
Creatinine, Ser: 1.22 mg/dL (ref 0.40–1.50)
GFR: 62.94 mL/min (ref >60.00)
Glucose, Bld: 63 mg/dL — ABNORMAL LOW (ref 70–99)
Potassium: 4.5 mEq/L (ref 3.5–5.1)
SODIUM: 146 meq/L — AB (ref 135–145)

## 2014-10-29 ENCOUNTER — Encounter: Payer: Self-pay | Admitting: Cardiology

## 2014-10-29 ENCOUNTER — Ambulatory Visit (INDEPENDENT_AMBULATORY_CARE_PROVIDER_SITE_OTHER): Payer: BLUE CROSS/BLUE SHIELD | Admitting: Cardiology

## 2014-10-29 VITALS — BP 172/92 | HR 73 | Ht 75.0 in | Wt 257.0 lb

## 2014-10-29 DIAGNOSIS — I5022 Chronic systolic (congestive) heart failure: Secondary | ICD-10-CM

## 2014-10-29 DIAGNOSIS — I509 Heart failure, unspecified: Secondary | ICD-10-CM

## 2014-10-29 MED ORDER — SPIRONOLACTONE 25 MG PO TABS: 25.0000 mg | ORAL_TABLET | Freq: Every day | ORAL | Status: DC

## 2014-10-29 NOTE — Addendum Note (Signed)
Addended by: Loa Socks on: 10/29/2014 06:35 PM   Modules accepted: Orders

## 2014-10-29 NOTE — Patient Instructions (Addendum)
Your physician has recommended you make the following change in your medication:   START TAKING SPIRONOLACTONE 25 MG ONCE DAILY    Your physician has requested that you have an echocardiogram. Echocardiography is a painless test that uses sound waves to create images of your heart. It provides your doctor with information about the size and shape of your heart and how well your heart's chambers and valves are working. This procedure takes approximately one hour. There are no restrictions for this procedure.  DR Allayne Stack LIKE THIS SCHEDULED FOR SOMETIME IN MARCH 2016   Your physician recommends that you schedule a follow-up appointment in: 3 MONTHS WITH DR Delton See

## 2014-10-29 NOTE — Progress Notes (Signed)
Patient Name: Bryan Moran Date of Encounter: 10/29/2014  Primary Care Provider:  Lorenda Peck, MD Primary Cardiologist:  Tobias Alexander H/Dr [facility]   Problem List   Past Medical History  Diagnosis Date  . Hypertension   . A-fib     a. Officially dx 05/27/14, sx may have been going on longer.  . Eczema   . Dysrhythmia   . Acute duodenal ulcer with hemorrhage 06/10/2014  . Acute blood loss anemia   . Atrial fibrillation, persistent   . Coagulopathy   . Venous insufficiency    Past Surgical History  Procedure Laterality Date  . Hernia repair  1993    [facility]  . Tee without cardioversion N/A 06/05/2014    Procedure: TRANSESOPHAGEAL ECHOCARDIOGRAM (TEE);  Surgeon: Quintella Reichert, MD;  Location: [facility] ENDOSCOPY;  Service: Cardiovascular;  Laterality: N/A;  . Cardioversion N/A 06/05/2014    Procedure: CARDIOVERSION;  Surgeon: Quintella Reichert, MD;  Location: [facility] ENDOSCOPY;  Service: Cardiovascular;  Laterality: N/A;  . Esophagogastroduodenoscopy N/A 06/07/2014    Procedure: ESOPHAGOGASTRODUODENOSCOPY (EGD);  Surgeon: Theda Belfast, MD;  Location: [facility] ENDOSCOPY;  Service: Endoscopy;  Laterality: N/A;  . Esophagogastroduodenoscopy N/A 06/12/2014    Procedure: ESOPHAGOGASTRODUODENOSCOPY (EGD);  Surgeon: Iva Boop, MD;  Location: [facility] ENDOSCOPY;  Service: Endoscopy;  Laterality: N/A;   Allergies  No Known Allergies  HPI  Bryan Moran is a 69 y.o. male presents today for EP follow-up.  He is maintaining sinus rhythm on amiodarone and feels very well, walking up to 2 miles a day. Marland Kitchen  His R leg edema hs resolved. No further gi issues with bleeding. He saw his GI doctor a few months back and  He thought it would be ok to restart NOAC, however pt  is adamant that he will not take any anticoagulants due to GI bleed in the fall on eliquis. He would consider a baby asa, but has a chads2vasc score of at least 3 and that would not be sufficient to be protective for stroke. He feels like  he has been maintaining SR, with h/o TMC, and echo will need to be repeated to see if improvement in SR.   He was just seen by Dr Johney Frame on 10/24/14 and is coming to establish CHF care. He denies any chest pain, dyspnea, dizziness, presyncope, syncope, or neurologic sequela. The patient is tolerating medications without difficulties and is otherwise without complaint. He has question about discontinuation of Amiodarone. The patient states that shortly after he was diagnosed with CHF he would be short of breath after walking a few steps however he started to exercise and now is able to walk to and half miles a day Nexium approximately 40 minutes.  Home Medications  Prior to Admission medications   Medication Sig Start Date End Date Taking? Authorizing Provider  amiodarone (PACERONE) 200 MG tablet Take 1 tablet (200 mg total) by mouth daily. 07/24/14  Yes Hillis Range, MD  carvedilol (COREG) 6.25 MG tablet Take 1 tablet (6.25 mg total) by mouth 2 (two) times daily with a meal. 06/14/14  Yes Janetta Hora, PA-C  Dermatological Products, Misc. (ELETONE EX) Apply 1 application topically 2 (two) times daily as needed (eczema).   Yes Historical Provider, MD  ferrous sulfate 325 (65 FE) MG tablet Take 1 tablet (325 mg total) by mouth 2 (two) times daily with a meal. 06/19/14  Yes Iva Boop, MD  furosemide (LASIX) 40 MG tablet One tablet by mouth daily  and as needed for swelling Patient taking differently: One tablet by mouth daily and you can take one extra tablet by mouth daily as needed for swelling 07/24/14  Yes Hillis Range, MD  lisinopril (PRINIVIL,ZESTRIL) 20 MG tablet Take 1 tablet (20 mg total) by mouth daily. 10/24/14  Yes Hillis Range, MD  pantoprazole (PROTONIX) 40 MG tablet Take 1 tablet (40 mg total) by mouth daily. 06/24/14  Yes Hillis Range, MD    Family History  Family History  Problem Relation Age of Onset  . Depression Father   . Heart disease Father     Some sort of heart  issue at end of life - possibly CAD  . Multiple sclerosis Mother   . Cancer Mother     Tumor in kidney, spread to bones    Social History  History   Social History  . Marital Status: Single    Spouse Name: N/A    Number of Children: N/A  . Years of Education: N/A   Occupational History  . Not on file.   Social History Main Topics  . Smoking status: Never Smoker   . Smokeless tobacco: Never Used  . Alcohol Use: No  . Drug Use: No  . Sexual Activity: Not Currently   Other Topics Concern  . Not on file   Social History Narrative     Review of Systems, as per HPI, otherwise negative General:  No chills, fever, night sweats or weight changes.  Cardiovascular:  No chest pain, dyspnea on exertion, edema, orthopnea, palpitations, paroxysmal nocturnal dyspnea. Dermatological: No rash, lesions/masses Respiratory: No cough, dyspnea Urologic: No hematuria, dysuria Abdominal:   No nausea, vomiting, diarrhea, bright Paone blood per rectum, melena, or hematemesis Neurologic:  No visual changes, wkns, changes in mental status. All other systems reviewed and are otherwise negative except as noted above.  Physical Exam  Blood pressure 172/92, pulse 73, height  (1.905 m), weight 257 lb (116.574 kg), SpO2 94 %.  General: Pleasant, NAD Psych: Normal affect. Neuro: Alert and oriented X 3. Moves all extremities spontaneously. HEENT: Normal  Neck: Supple without bruits or JVD. Lungs:  Resp regular and unlabored, CTA. Heart: RRR no s3, s4, 2/6 systolic murmur. Abdomen: Soft, non-tender, non-distended, BS + x 4.  Extremities: No clubbing, cyanosis , chronic venous stasis and insufficiency. DP/PT/Radials 2+ and equal bilaterally.  Labs:  No results for input(s): CKTOTAL, CKMB, TROPONINI in the last 72 hours. Lab Results  Component Value Date   WBC 8.5 10/24/2014   HGB 15.7 10/24/2014   HCT 46.9 10/24/2014   MCV 88.9 10/24/2014   PLT 246.0 10/24/2014    No results found for:  DDIMER Invalid input(s): POCBNP    Component Value Date/Time   NA 146* 10/24/2014 1234   K 4.5 10/24/2014 1234   CL 107 10/24/2014 1234   CO2 23 10/24/2014 1234   GLUCOSE 63* 10/24/2014 1234   BUN 16 10/24/2014 1234   CREATININE 1.22 10/24/2014 1234   CALCIUM 9.3 10/24/2014 1234   PROT 7.2 10/24/2014 1234   ALBUMIN 4.0 10/24/2014 1234   AST 16 10/24/2014 1234   ALT 13 10/24/2014 1234   ALKPHOS 75 10/24/2014 1234   BILITOT 0.9 10/24/2014 1234   GFRNONAA 85* 06/13/2014 0407   GFRAA >90 06/13/2014 0407   Lab Results  Component Value Date   CHOL 119 06/03/2014   HDL 24* 06/03/2014   LDLCALC 80 06/03/2014   TRIG 73 06/03/2014    Accessory Clinical Findings   Echo:  06/03/2014 Study Conclusions  - Left ventricle: There is profound contractile dyssynchrony, but no clear regional wall motion abnormality. The cavity size was mildly dilated. There was moderate concentric hypertrophy. Systolic function was severely reduced. The estimated ejection fraction was in the range of 15% to 20%. Diffuse hypokinesis. Features are consistent with a pseudonormal left ventricular filling pattern, with concomitant abnormal relaxation and increased filling pressure (grade 2 diastolic dysfunction). No evidence of thrombus. - Ventricular septum: Septal motion showed abnormal function, dyssynergy, and paradox. These changes are consistent with a left bundle branch block. - Aortic valve: There was mild regurgitation. - Mitral valve: There was moderate regurgitation directed centrally. - Left atrium: The atrium was severely dilated. - Right ventricle: The cavity size was moderately dilated. - Right atrium: The atrium was moderately to severely dilated.   Assessment and Plan:  1. Persistent afib Doing well with amiodarone, maintaining SR. He is inquiring about discontinuation of amiodarone, possibly if LVEF > 35% on the next echocardiogram. He will discuss with Dr Johney Frame.  Continue  daily for now. LFTs normal, TSH mildly abnormal, we wil recheck in 3 months.  CHA2DS2-VASc Score 3. He had a terrible experience with GIB after starting Eliquis in the hospital Hb 16--> 8.1 requiring transfusions. He is clear in his decision to decline anticoagulation. Dr Johney Frame offered left atrial appendage occlusion device placement as an alternative.  Though he may be willing to consider this is the future, he is not ready to do so at this time.    2. 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  Hopefully his EF has improved with sinus rhythm.    3. Chronic systolic CHF - well compensated, improving functional capacity,  Continue lasix 40 mg po daily Add spironolactone 25 mg po daily 2 gram sodium restriction Increase lisinopril to  daily for elevated BP today   4. H/O GI Bleed with blood loss anemia  Continue protonix CBC today. Although Dr. Leone Payor thought he would be ok to resume NOAC, pt is very concerned re another GI bleed.  Risks discussion of stroke vs bleeding as above.  5. Swelling of RLE/ venous insufficiniency Resolved No DVT by  doppler Support hose as needed  6. Hypertensive cardiovascular disease with CHF - uncontrolled, add spironolactone 25 mg po daily  Follow up in 3 months.   Lars Masson, MD, [facility] 10/29/2014, 5:32 PM

## 2014-11-01 ENCOUNTER — Telehealth: Payer: Self-pay | Admitting: *Deleted

## 2014-11-01 NOTE — Telephone Encounter (Signed)
pt called back to have labs explained once again..he wanted to make sure it was Hyperthyroidism.

## 2014-11-01 NOTE — Telephone Encounter (Signed)
spoke to pt and reviewed results, told him that Dr. Johney Frame suggested seeing PCP to be evaluated for hyperthyroidism.  Pt expressed understanding.Marland KitchenMarland Kitchen

## 2014-11-27 ENCOUNTER — Ambulatory Visit (HOSPITAL_COMMUNITY): Payer: BLUE CROSS/BLUE SHIELD | Attending: Cardiology | Admitting: Cardiology

## 2014-11-27 DIAGNOSIS — I509 Heart failure, unspecified: Secondary | ICD-10-CM | POA: Diagnosis present

## 2014-11-27 NOTE — Progress Notes (Addendum)
Echo performed.    Continue to titrate CHF medicines as able. I will discuss possible ICD with him when he returns though he was very reluctant to consider any additional procedures when I saw him last.

## 2014-12-02 ENCOUNTER — Telehealth: Payer: Self-pay | Admitting: Cardiology

## 2014-12-02 NOTE — Telephone Encounter (Signed)
12-02-14 called pt to set up appt  per conversation with nurse Lajoyce Corners that pt has been referred back to [facility] for consideration of ICD implant. Pt said he feels stronger than he did back in the fall and doesn't know what to do. I told him he would be making an appt to DISCUSS the implant and be able to ask questions to help with his decision. He feels maybe he will wait and talk to Dr. Delton See in May re echo results in further detail. I asked him to call me if he changed his mind and I would make him an appt, or perhaps we could move Dr. Lindaann Slough appt up sooner. Pt to think about options and call back if wants appt.

## 2014-12-03 NOTE — Telephone Encounter (Signed)
Informed the pt that per Dr Delton See she understands the hesitancy of getting an ICD, and if he wants to wait til May, he must understand the risk of potential SCD.  Provided pt education on this, and highly advised him to schedule a follow-up appt with Dr Johney Frame to go over any questions or concerns, and discuss ICD implantation.  Pt states he will go ahead and proceed with scheduling the appt.  Informed him that I will send Glynda Jaeger, the EP scheduler that he wants to proceed with scheduling the appt with Dr Johney Frame.  Pt agrees with plan.

## 2014-12-03 NOTE — Telephone Encounter (Signed)
1105 AM PT RTN CALL [phone]

## 2014-12-03 NOTE — Telephone Encounter (Signed)
I know that he was hesitant to get an ICD. Its ok if he wants to wait till May if he understands the risks of potential SCD.  KN

## 2014-12-03 NOTE — Telephone Encounter (Signed)
Left message for the pt to call back in regards to endorsing Dr Lindaann Slough message below.

## 2015-01-03 ENCOUNTER — Telehealth (HOSPITAL_COMMUNITY): Payer: Self-pay | Admitting: *Deleted

## 2015-01-03 NOTE — Telephone Encounter (Signed)
Called to set up 3 month follow up with afib clinic - pt states hes set up to see Dr. Delton See in May and would prefer to just follow up then.  Told him to call if any problems as Dr. Johney Frame had referred him to afib clinic for follow up. Patient verbalized understanding.

## 2015-01-07 ENCOUNTER — Other Ambulatory Visit: Payer: Self-pay | Admitting: *Deleted

## 2015-01-07 MED ORDER — CARVEDILOL 6.25 MG PO TABS: 6.2500 mg | ORAL_TABLET | Freq: Two times a day (BID) | ORAL | Status: DC

## 2015-02-04 ENCOUNTER — Encounter: Payer: Self-pay | Admitting: Cardiology

## 2015-02-04 ENCOUNTER — Ambulatory Visit (INDEPENDENT_AMBULATORY_CARE_PROVIDER_SITE_OTHER): Payer: BLUE CROSS/BLUE SHIELD | Admitting: Cardiology

## 2015-02-04 ENCOUNTER — Telehealth: Payer: Self-pay | Admitting: *Deleted

## 2015-02-04 VITALS — BP 162/82 | HR 72 | Ht 75.0 in | Wt 269.0 lb

## 2015-02-04 DIAGNOSIS — I519 Heart disease, unspecified: Secondary | ICD-10-CM | POA: Diagnosis not present

## 2015-02-04 DIAGNOSIS — I447 Left bundle-branch block, unspecified: Secondary | ICD-10-CM | POA: Diagnosis not present

## 2015-02-04 DIAGNOSIS — I5022 Chronic systolic (congestive) heart failure: Secondary | ICD-10-CM

## 2015-02-04 DIAGNOSIS — I480 Paroxysmal atrial fibrillation: Secondary | ICD-10-CM

## 2015-02-04 DIAGNOSIS — I10 Essential (primary) hypertension: Secondary | ICD-10-CM

## 2015-02-04 LAB — COMPREHENSIVE METABOLIC PANEL
ALT: 17 U/L (ref 0–53)
AST: 15 U/L (ref 0–37)
Albumin: 3.7 g/dL (ref 3.5–5.2)
Alkaline Phosphatase: 59 U/L (ref 39–117)
BUN: 16 mg/dL (ref 6–23)
CO2: 29 mEq/L (ref 19–32)
Calcium: 9.1 mg/dL (ref 8.4–10.5)
Chloride: 107 mEq/L (ref 96–112)
Creatinine, Ser: 1.03 mg/dL (ref 0.40–1.50)
GFR: 76.45 mL/min (ref >60.00)
Glucose, Bld: 119 mg/dL — ABNORMAL HIGH (ref 70–99)
Potassium: 4.1 mEq/L (ref 3.5–5.1)
Sodium: 141 mEq/L (ref 135–145)
Total Bilirubin: 1.2 mg/dL (ref 0.2–1.2)
Total Protein: 6.7 g/dL (ref 6.0–8.3)

## 2015-02-04 LAB — CBC WITH DIFFERENTIAL/PLATELET
Basophils Absolute: 0 10*3/uL (ref 0.0–0.1)
Basophils Relative: 0.5 % (ref 0.0–3.0)
Eosinophils Absolute: 0.2 10*3/uL (ref 0.0–0.7)
Eosinophils Relative: 2.3 % (ref 0.0–5.0)
HCT: 43.6 % (ref 39.0–52.0)
Hemoglobin: 14.8 g/dL (ref 13.0–17.0)
Lymphocytes Relative: 17.1 % (ref 12.0–46.0)
Lymphs Abs: 1.1 10*3/uL (ref 0.7–4.0)
MCHC: 34 g/dL (ref 30.0–36.0)
MCV: 94.5 fl (ref 78.0–100.0)
Monocytes Absolute: 0.6 10*3/uL (ref 0.1–1.0)
Monocytes Relative: 8.8 % (ref 3.0–12.0)
Neutro Abs: 4.7 10*3/uL (ref 1.4–7.7)
Neutrophils Relative %: 71.3 % (ref 43.0–77.0)
Platelets: 240 10*3/uL (ref 150.0–400.0)
RBC: 4.61 Mil/uL (ref 4.22–5.81)
RDW: 14.4 % (ref 11.5–15.5)
WBC: 6.6 10*3/uL (ref 4.0–10.5)

## 2015-02-04 LAB — TSH: TSH: 4.36 u[IU]/mL (ref 0.35–4.50)

## 2015-02-04 LAB — BRAIN NATRIURETIC PEPTIDE: Pro B Natriuretic peptide (BNP): 185 pg/mL — ABNORMAL HIGH (ref 0.0–100.0)

## 2015-02-04 NOTE — Progress Notes (Signed)
Patient ID: [name], male   DOB: 1946/09/25, 69 y.o.   MRN: [id number]   Patient Name: Arianna Delsanto Date of Encounter: 02/04/2015  Primary Care Provider:  Lorenda Peck, MD Primary Cardiologist:  Tobias Alexander H/Dr Allred  Problem List   Past Medical History  Diagnosis Date  . Hypertension   . A-fib     a. Officially dx 05/27/14, sx may have been going on longer.  . Eczema   . Dysrhythmia   . Acute duodenal ulcer with hemorrhage 06/10/2014  . Acute blood loss anemia   . Atrial fibrillation, persistent   . Coagulopathy   . Venous insufficiency    Past Surgical History  Procedure Laterality Date  . Hernia repair  1993    [facility]  . Tee without cardioversion N/A 06/05/2014    Procedure: TRANSESOPHAGEAL ECHOCARDIOGRAM (TEE);  Surgeon: Quintella Reichert, MD;  Location: [facility] ENDOSCOPY;  Service: Cardiovascular;  Laterality: N/A;  . Cardioversion N/A 06/05/2014    Procedure: CARDIOVERSION;  Surgeon: Quintella Reichert, MD;  Location: [facility] ENDOSCOPY;  Service: Cardiovascular;  Laterality: N/A;  . Esophagogastroduodenoscopy N/A 06/07/2014    Procedure: ESOPHAGOGASTRODUODENOSCOPY (EGD);  Surgeon: Theda Belfast, MD;  Location: [facility] ENDOSCOPY;  Service: Endoscopy;  Laterality: N/A;  . Esophagogastroduodenoscopy N/A 06/12/2014    Procedure: ESOPHAGOGASTRODUODENOSCOPY (EGD);  Surgeon: Iva Boop, MD;  Location: [facility] ENDOSCOPY;  Service: Endoscopy;  Laterality: N/A;   Allergies  No Known Allergies  Chief complain: DOE  HPI  Maleke Feria is a 69 y.o. male presents today for EP follow-up.  He is maintaining sinus rhythm on amiodarone and feels very well, walking up to 2 miles a day. Marland Kitchen  His R leg edema hs resolved. No further gi issues with bleeding. He saw his GI doctor a few months back and  He thought it would be ok to restart NOAC, however pt  is adamant that he will not take any anticoagulants due to GI bleed in the fall on eliquis. He would consider a baby asa, but has a chads2vasc  score of at least 3 and that would not be sufficient to be protective for stroke. He feels like he has been maintaining SR, with h/o TMC, and echo will need to be repeated to see if improvement in SR.   He was just seen by Dr Johney Frame on 10/24/14 and is coming to establish CHF care. He denies any chest pain, dyspnea, dizziness, presyncope, syncope, or neurologic sequela. The patient is tolerating medications without difficulties and is otherwise without complaint. He has question about discontinuation of Amiodarone. The patient states that shortly after he was diagnosed with CHF he would be short of breath after walking a few steps however he started to exercise and now is able to walk to and half miles a day Nexium approximately 40 minutes.  02/04/2015 - the patient is coming after 3 months, he has been doing well, started to exercise, walking - to running 2.5 miles - 4 miles daily. He feels very well, denies chest pain, palpitations, syncope. He has occasional DOE. No orthopnea or PND. His BP has been elevated, he is complaint with his meds and has several questions regarding diet and exercise.   Home Medications  Prior to Admission medications   Medication Sig Start Date End Date Taking? Authorizing Provider  amiodarone (PACERONE) 200 MG tablet Take 1 tablet (200 mg total) by mouth daily. 07/24/14  Yes Hillis Range, MD  carvedilol (COREG) 6.25 MG tablet Take 1 tablet (  6.25 mg total) by mouth 2 (two) times daily with a meal. 06/14/14  Yes Janetta Hora, PA-C  Dermatological Products, Misc. (ELETONE EX) Apply 1 application topically 2 (two) times daily as needed (eczema).   Yes Historical Provider, MD  ferrous sulfate 325 (65 FE) MG tablet Take 1 tablet (325 mg total) by mouth 2 (two) times daily with a meal. 06/19/14  Yes Iva Boop, MD  furosemide (LASIX) 40 MG tablet One tablet by mouth daily and as needed for swelling Patient taking differently: One tablet by mouth daily and you can take one  extra tablet by mouth daily as needed for swelling 07/24/14  Yes Hillis Range, MD  lisinopril (PRINIVIL,ZESTRIL) 20 MG tablet Take 1 tablet (20 mg total) by mouth daily. 10/24/14  Yes Hillis Range, MD  pantoprazole (PROTONIX) 40 MG tablet Take 1 tablet (40 mg total) by mouth daily. 06/24/14  Yes Hillis Range, MD    Family History  Family History  Problem Relation Age of Onset  . Depression Father   . Heart disease Father     Some sort of heart issue at end of life - possibly CAD  . Multiple sclerosis Mother   . Cancer Mother     Tumor in kidney, spread to bones    Social History  History   Social History  . Marital Status: Single    Spouse Name: N/A  . Number of Children: N/A  . Years of Education: N/A   Occupational History  . Not on file.   Social History Main Topics  . Smoking status: Never Smoker   . Smokeless tobacco: Never Used  . Alcohol Use: No  . Drug Use: No  . Sexual Activity: Not Currently   Other Topics Concern  . Not on file   Social History Narrative     Review of Systems, as per HPI, otherwise negative General:  No chills, fever, night sweats or weight changes.  Cardiovascular:  No chest pain, dyspnea on exertion, edema, orthopnea, palpitations, paroxysmal nocturnal dyspnea. Dermatological: No rash, lesions/masses Respiratory: No cough, dyspnea Urologic: No hematuria, dysuria Abdominal:   No nausea, vomiting, diarrhea, bright Pinkus blood per rectum, melena, or hematemesis Neurologic:  No visual changes, wkns, changes in mental status. All other systems reviewed and are otherwise negative except as noted above.  Physical Exam  Blood pressure 162/82, pulse 72, height  (1.905 m), weight 269 lb (122.018 kg).  General: Pleasant, NAD Psych: Normal affect. Neuro: Alert and oriented X 3. Moves all extremities spontaneously. HEENT: Normal  Neck: Supple without bruits or JVD. Lungs:  Resp regular and unlabored, CTA. Heart: RRR no s3, s4, 2/6  systolic murmur. Abdomen: Soft, non-tender, non-distended, BS + x 4.  Extremities: No clubbing, cyanosis , chronic venous stasis and insufficiency. DP/PT/Radials 2+ and equal bilaterally.  Labs:  No results for input(s): CKTOTAL, CKMB, TROPONINI in the last 72 hours. Lab Results  Component Value Date   WBC 8.5 10/24/2014   HGB 15.7 10/24/2014   HCT 46.9 10/24/2014   MCV 88.9 10/24/2014   PLT 246.0 10/24/2014    No results found for: DDIMER Invalid input(s): POCBNP    Component Value Date/Time   NA 146* 10/24/2014 1234   K 4.5 10/24/2014 1234   CL 107 10/24/2014 1234   CO2 23 10/24/2014 1234   GLUCOSE 63* 10/24/2014 1234   BUN 16 10/24/2014 1234   CREATININE 1.22 10/24/2014 1234   CALCIUM 9.3 10/24/2014 1234   PROT 7.2 10/24/2014 1234  ALBUMIN 4.0 10/24/2014 1234   AST 16 10/24/2014 1234   ALT 13 10/24/2014 1234   ALKPHOS 75 10/24/2014 1234   BILITOT 0.9 10/24/2014 1234   GFRNONAA 85* 06/13/2014 0407   GFRAA >90 06/13/2014 0407   Lab Results  Component Value Date   CHOL 119 06/03/2014   HDL 24* 06/03/2014   LDLCALC 80 06/03/2014   TRIG 73 06/03/2014    Accessory Clinical Findings   Echo: 06/03/2014 Study Conclusions  - Left ventricle: There is profound contractile dyssynchrony, but no clear regional wall motion abnormality. The cavity size was mildly dilated. There was moderate concentric hypertrophy. Systolic function was severely reduced. The estimated ejection fraction was in the range of 15% to 20%. Diffuse hypokinesis. Features are consistent with a pseudonormal left ventricular filling pattern, with concomitant abnormal relaxation and increased filling pressure (grade 2 diastolic dysfunction). No evidence of thrombus. - Ventricular septum: Septal motion showed abnormal function, dyssynergy, and paradox. These changes are consistent with a left bundle branch block. - Aortic valve: There was mild regurgitation. - Mitral valve: There  was moderate regurgitation directed centrally. - Left atrium: The atrium was severely dilated. - Right ventricle: The cavity size was moderately dilated. - Right atrium: The atrium was moderately to severely dilated.  11/27/2014 - Left ventricle: The cavity size was normal. There was mild concentric hypertrophy. Systolic function was severely reduced. The estimated ejection fraction was in the range of 25% to 30%. Diffuse hypokinesis. Possible hypokinesis of the mid-apicalanterior and apical myocardium. This may be artifactual and related to LBBB. Doppler parameters are consistent with abnormal left ventricular relaxation (grade 1 diastolic dysfunction). Indeterminate mean left atrial pressure. - Ventricular septum: Septal motion showed abnormal function, dyssynergy, and paradox. - Aortic valve: There was mild to moderate regurgitation directed centrally in the LVOT. - Mitral valve: There was mild regurgitation. - Left atrium: The atrium was moderately dilated. - Right atrium: The atrium was mildly dilated.  Impressions:  - Compared to September 2015, LVEF has improved, but remains severely depressed. There is profound systolic dyssynchrony.  Assessment and Plan:  1. Persistent afib Doing well with amiodarone, maintaining SR. He is inquiring about discontinuation of amiodarone, possibly if LVEF > 35% on the next echocardiogram. In march 2016 still LVEF 25-30%. CHA2DS2-VASc Score 3. He had a terrible experience with GIB after starting Eliquis in the hospital Hb 16--> 8.1 requiring transfusions. He is clear in his decision to decline anticoagulation. Dr Johney Frame offered left atrial appendage occlusion device placement as an alternative.  Though he may be willing to consider this is the future, he is not ready to do so at this time.    2. LV dysfunction, thought to be tachy mediated, however minimal improvement with 6 months of medical therapy.  We will check CMP  today and increase lisinopril to 40 mg po daily if crea stable.  He doesn't show any signs of fluid overload, however with persistently low EF and LBBB he should be considered for BiV-ICD.  We will refer to Dr Johney Frame.  We will repeat echo in 3months (he doesn't want to do it earlier).  3. Chronic systolic CHF - well compensated, improving functional capacity,  Continue lasix 40 mg po daily and spironolactone 25 mg po daily 2 gram sodium restriction Increase lisinopril to 40mg  daily for elevated BP today   4. H/O GI Bleed with blood loss anemia  Continue protonix CBC today. Although Dr. Leone Payor thought he would be ok to resume NOAC, pt is very  concerned re another GI bleed.  Risks discussion of stroke vs bleeding as above.  5. Swelling of RLE/ venous insufficiniency Resolved No DVT by  doppler Support hose as needed  6. Hypertension - increase lisinopril to 40 mg po daily of Crea stable.   Follow up in 3 months with echo prior to the visit, CMP, CBC today, referral to EP clinic.    Lars Masson, MD, [facility] 02/04/2015, 10:54 AM

## 2015-02-04 NOTE — Telephone Encounter (Signed)
Contacted the pt to inform him that per Dr Delton See his labs were normal, but she does want to increase his Lisinopril to 40 mg po daily.  Confirmed the pharmacy of choice with the pt and informed him that I will phone this medication into his pharmacy tomorrow during the pharmacies business hours.  Pt verbalized understanding and agrees with this plan.

## 2015-02-04 NOTE — Patient Instructions (Signed)
Medication Instructions:   Your physician recommends that you continue on your current medications as directed. Please refer to the Current Medication list given to you today.   Labwork:  TODAY---CMET, CBC W DIFF, TSH, BNP    Testing/Procedures:  Your physician has requested that you have an echocardiogram. Echocardiography is a painless test that uses sound waves to create images of your heart. It provides your doctor with information about the size and shape of your heart and how well your heart's chambers and valves are working. This procedure takes approximately one hour. There are no restrictions for this procedure.  PLEASE HAVE THIS SCHEDULED PRIOR TO YOUR 3 MONTH FOLLOW-UP APPOINTMENT WITH DR Delton See   Follow-Up:  Your physician recommends that you schedule a follow-up appointment in: 3 MONTHS WITH DR NELSON---PLEASE HAVE YOUR ECHO SCHEDULED PRIOR TO THIS OFFICE VISIT

## 2015-02-04 NOTE — Telephone Encounter (Signed)
-----   Message from Lars Masson, MD sent at 02/04/2015  5:46 PM EDT ----- All labs are normal, I would increase lisinopril to 40 mg po daily

## 2015-02-05 MED ORDER — LISINOPRIL 40 MG PO TABS: 40.0000 mg | ORAL_TABLET | Freq: Every day | ORAL | Status: DC

## 2015-03-17 ENCOUNTER — Encounter: Payer: Self-pay | Admitting: Internal Medicine

## 2015-03-17 ENCOUNTER — Ambulatory Visit (INDEPENDENT_AMBULATORY_CARE_PROVIDER_SITE_OTHER): Payer: BLUE CROSS/BLUE SHIELD | Admitting: Internal Medicine

## 2015-03-17 VITALS — BP 202/118 | HR 82 | Ht 75.0 in | Wt 259.2 lb

## 2015-03-17 DIAGNOSIS — I519 Heart disease, unspecified: Secondary | ICD-10-CM | POA: Diagnosis not present

## 2015-03-17 DIAGNOSIS — I509 Heart failure, unspecified: Secondary | ICD-10-CM

## 2015-03-17 DIAGNOSIS — I110 Hypertensive heart disease with heart failure: Secondary | ICD-10-CM | POA: Diagnosis not present

## 2015-03-17 DIAGNOSIS — I480 Paroxysmal atrial fibrillation: Secondary | ICD-10-CM | POA: Diagnosis not present

## 2015-03-17 DIAGNOSIS — I5022 Chronic systolic (congestive) heart failure: Secondary | ICD-10-CM | POA: Diagnosis not present

## 2015-03-17 MED ORDER — CARVEDILOL 12.5 MG PO TABS: 12.5000 mg | ORAL_TABLET | Freq: Two times a day (BID) | ORAL | Status: DC

## 2015-03-17 NOTE — Patient Instructions (Signed)
Medication Instructions:  Your physician has recommended you make the following change in your medication:  1) Increase Carvedilol to 12.5 mg twice daily   Labwork: None ordered  Testing/Procedures: None ordered today Will cancel echo and reschedule once medications are maximized   Follow-Up: Your physician recommends that you schedule a follow-up appointment as scheduled   Any Other Special Instructions Will Be Listed Below (If Applicable).

## 2015-03-18 NOTE — Progress Notes (Signed)
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 male presents today for EP follow-up.  He is maintaining sinus rhythm on amiodarone and feels very well, walking up to 3 miles a day. Marland Kitchen  His R leg edema has improved. No further gi issues with bleeding.  He has declined anticoagulation. He is followed by Dr Delton See also.  He presents today to discussion possible BiV ICD. Today, pt denies any chest pain, dyspnea, dizziness, presyncope, syncope, or neurologic sequela. The patient is tolerating medications without difficulties and is otherwise without complaint today.   Past Medical History  Diagnosis Date  . Hypertension   . A-fib     a. Officially dx 05/27/14, sx may have been going on longer.  . Eczema   . Dysrhythmia   . Acute duodenal ulcer with hemorrhage 06/10/2014  . Acute blood loss anemia   . Atrial fibrillation, persistent   . Coagulopathy   . Venous insufficiency    Past Surgical History  Procedure Laterality Date  . Hernia repair  1993    [facility]  . Tee without cardioversion N/A 06/05/2014    Procedure: TRANSESOPHAGEAL ECHOCARDIOGRAM (TEE);  Surgeon: Quintella Reichert, MD;  Location: [facility] ENDOSCOPY;  Service: Cardiovascular;  Laterality: N/A;  . Cardioversion N/A 06/05/2014    Procedure: CARDIOVERSION;  Surgeon: Quintella Reichert, MD;  Location: [facility] ENDOSCOPY;  Service: Cardiovascular;  Laterality: N/A;  . Esophagogastroduodenoscopy N/A 06/07/2014    Procedure: ESOPHAGOGASTRODUODENOSCOPY (EGD);  Surgeon: Theda Belfast, MD;  Location: [facility] ENDOSCOPY;  Service: Endoscopy;  Laterality: N/A;  . Esophagogastroduodenoscopy N/A 06/12/2014    Procedure: ESOPHAGOGASTRODUODENOSCOPY (EGD);  Surgeon: Iva Boop, MD;  Location: [facility] ENDOSCOPY;  Service: Endoscopy;  Laterality: N/A;    Current Outpatient Prescriptions  Medication Sig Dispense Refill  . amiodarone (PACERONE) 200 MG tablet Take 1 tablet  (200 mg total) by mouth daily.    . carvedilol (COREG) 6.25 MG tablet Take 1 tablet (6.25 mg total) by mouth 2 (two) times daily with a meal. 60 tablet 6  . Dermatological Products, Misc. (ELETONE EX) Apply 1 application topically 2 (two) times daily as needed (eczema).    . ferrous sulfate 325 (65 FE) MG tablet Take 1 tablet (325 mg total) by mouth 2 (two) times daily with a meal.  3  . furosemide (LASIX) 40 MG tablet One tablet by mouth daily and as needed for swelling (Patient taking differently: One tablet by mouth daily and you can take one extra tablet by mouth daily as needed for swelling) 45 tablet 11  . lisinopril (PRINIVIL,ZESTRIL) 20 MG tablet Take 1 tablet (20 mg total) by mouth daily. 90 tablet 3  . pantoprazole (PROTONIX) 40 MG tablet Take 1 tablet (40 mg total) by mouth daily. 30 tablet 11   No current facility-administered medications for this visit.    No Known Allergies  History   Social History  . Marital Status: Single    Spouse Name: N/A    Number of Children: N/A  . Years of Education: N/A   Occupational History  . Not on file.   Social History Main Topics  . Smoking status: Never Smoker   . Smokeless tobacco: Never Used  . Alcohol Use: No  . Drug Use: No  . Sexual Activity: Not Currently   Other Topics Concern  . Not on file   Social History Narrative  Family History  Problem Relation Age of Onset  . Depression Father   . Heart disease Father     Some sort of heart issue at end of life - possibly CAD  . Multiple sclerosis Mother   . Cancer Mother     Tumor in kidney, spread to bones    ROS- All systems are reviewed and negative except as per the HPI above  Physical Exam: Filed Vitals:   10/24/14 1107  BP: 182/76  Pulse: 78  Height:  (1.905 m)  Weight: 257 lb (116.574 kg)    GEN- The patient is well appearing, alert and oriented x 3 today.   Head- normocephalic, atraumatic Eyes-  Sclera clear, conjunctiva pink Ears- hearing  intact Oropharynx- clear Neck- clear, normal work of breathing Heart- Regular rate and rhythm, no murmurs, rubs or gallops, PMI not laterally displaced GI- soft, NT, ND, + BS Extremities- no significant LEE. MS- no significant deformity or atroph Skin- no rash or lesiony Psych- euthymic mood, full affect Neuro- strength and sensation are intact  EKG-SR with LBBB, with PAC,PVC  Assessment and Plan: 1. Persistent afib Doing well with amiodarone Continue  daily long term Today,I had a long discussion with pt regarding his risk of stroke with h/o afib and chads2vasc sore of at least 3.  I am concerned about his embolic risks.  This patients CHA2DS2-VASc Score and unadjusted Ischemic Stroke Rate (% per year) is equal to 3.2 % stroke rate/year from a score of 3  He is clear in his decision to decline anticoagulation  2. LV dysfunction  Previously attributed to AF with RVR though his EF has not recovered with sinus rhythm.  It may be reasonable to consider either stress testing or cath to exclude ischemia as I do not believe that he has had a prior ischemic workup.  I will defer to Dr Delton See. Today, we discussed BiV ICD implant.  He is very clear that he has not interest in device implant at this time.  He seems to be in a bit of denial about his health issues in general. Given BP 200/120, I think that he has substantial room for medicine titration. I will increase coreg to 12.5mg  BID today (He was previously taking only 6.25mg  daily). Continue to titrate coreg upon follow-up with Dr Delton See.  He would also be a candidate for corlanor or entresto.  Given his current reluctance to consider ICD, he may also be reluctant to take these new medicines also.  3. H/O GI Bleed with blood loss anemia  Resolved Could consider LAA occlusion in the future  4. Hypertensive cardiovascular disease with CHF Uncontrolled BP.  Will increase coreg today.  Follow-up with Dr Delton See as scheduled I am happy  to see again when needed

## 2015-05-07 ENCOUNTER — Other Ambulatory Visit (HOSPITAL_COMMUNITY): Payer: BLUE CROSS/BLUE SHIELD

## 2015-05-14 ENCOUNTER — Ambulatory Visit (INDEPENDENT_AMBULATORY_CARE_PROVIDER_SITE_OTHER): Payer: BLUE CROSS/BLUE SHIELD | Admitting: Cardiology

## 2015-05-14 ENCOUNTER — Encounter: Payer: Self-pay | Admitting: Cardiology

## 2015-05-14 VITALS — BP 186/100 | HR 73 | Ht 75.0 in | Wt 262.0 lb

## 2015-05-14 DIAGNOSIS — I10 Essential (primary) hypertension: Secondary | ICD-10-CM

## 2015-05-14 DIAGNOSIS — I480 Paroxysmal atrial fibrillation: Secondary | ICD-10-CM | POA: Diagnosis not present

## 2015-05-14 DIAGNOSIS — I5022 Chronic systolic (congestive) heart failure: Secondary | ICD-10-CM

## 2015-05-14 DIAGNOSIS — I481 Persistent atrial fibrillation: Secondary | ICD-10-CM | POA: Diagnosis not present

## 2015-05-14 DIAGNOSIS — I4819 Other persistent atrial fibrillation: Secondary | ICD-10-CM

## 2015-05-14 LAB — COMPREHENSIVE METABOLIC PANEL
ALT: 12 U/L (ref 0–53)
AST: 13 U/L (ref 0–37)
Albumin: 4 g/dL (ref 3.5–5.2)
Alkaline Phosphatase: 56 U/L (ref 39–117)
BUN: 15 mg/dL (ref 6–23)
CO2: 28 mEq/L (ref 19–32)
Calcium: 9.2 mg/dL (ref 8.4–10.5)
Chloride: 105 mEq/L (ref 96–112)
Creatinine, Ser: 1.15 mg/dL (ref 0.40–1.50)
GFR: 67.27 mL/min (ref >60.00)
Glucose, Bld: 92 mg/dL (ref 70–99)
Potassium: 4 mEq/L (ref 3.5–5.1)
Sodium: 140 mEq/L (ref 135–145)
Total Bilirubin: 1.1 mg/dL (ref 0.2–1.2)
Total Protein: 7 g/dL (ref 6.0–8.3)

## 2015-05-14 LAB — TSH: TSH: 3.58 u[IU]/mL (ref 0.35–4.50)

## 2015-05-14 MED ORDER — SACUBITRIL-VALSARTAN 49-51 MG PO TABS: 1.0000 | ORAL_TABLET | Freq: Two times a day (BID) | ORAL | Status: DC

## 2015-05-14 NOTE — Patient Instructions (Signed)
Medication Instructions:   STOP TAKING LISINOPRIL STARTING TODAY  START TAKING ENTRESTO 49/57 TWICE DAILY STARTING YOUR FIRST DOSE ON THIS Friday 05/16/15 AT 8 AM     Labwork:  TODAY--CMET AND TSH     Follow-Up:  Your physician wants you to follow-up in: 6 MONTHS WITH DR Johnell Comings will receive a reminder letter in the mail two months in advance. If you don't receive a letter, please call our office to schedule the follow-up appointment.'

## 2015-05-14 NOTE — Progress Notes (Signed)
Patient ID: [name], male   DOB: 11-24-1945, 69 y.o.   MRN: [id number]     Patient Name: Bryan Moran Date of Encounter: 05/14/2015  Primary Care Provider:  Lorenda Peck, MD Primary Cardiologist:  Tobias Alexander H/Dr Allred  Problem List   Past Medical History  Diagnosis Date  . Hypertension   . A-fib     a. Officially dx 05/27/14, sx may have been going on longer.  . Eczema   . Dysrhythmia   . Acute duodenal ulcer with hemorrhage 06/10/2014  . Acute blood loss anemia   . Atrial fibrillation, persistent   . Coagulopathy   . Venous insufficiency    Past Surgical History  Procedure Laterality Date  . Hernia repair  1993    [facility]  . Tee without cardioversion N/A 06/05/2014    Procedure: TRANSESOPHAGEAL ECHOCARDIOGRAM (TEE);  Surgeon: Quintella Reichert, MD;  Location: [facility] ENDOSCOPY;  Service: Cardiovascular;  Laterality: N/A;  . Cardioversion N/A 06/05/2014    Procedure: CARDIOVERSION;  Surgeon: Quintella Reichert, MD;  Location: [facility] ENDOSCOPY;  Service: Cardiovascular;  Laterality: N/A;  . Esophagogastroduodenoscopy N/A 06/07/2014    Procedure: ESOPHAGOGASTRODUODENOSCOPY (EGD);  Surgeon: Theda Belfast, MD;  Location: [facility] ENDOSCOPY;  Service: Endoscopy;  Laterality: N/A;  . Esophagogastroduodenoscopy N/A 06/12/2014    Procedure: ESOPHAGOGASTRODUODENOSCOPY (EGD);  Surgeon: Iva Boop, MD;  Location: [facility] ENDOSCOPY;  Service: Endoscopy;  Laterality: N/A;   Allergies  No Known Allergies  HPI  Bryan Moran is a 69 y.o. male presents today for EP follow-up.  He is maintaining sinus rhythm on amiodarone and feels very well, walking up to 2 miles a day. Marland Kitchen  His R leg edema hs resolved. No further gi issues with bleeding. He saw his GI doctor a few months back and  He thought it would be ok to restart NOAC, however pt  is adamant that he will not take any anticoagulants due to GI bleed in the fall on eliquis. He would consider a baby asa, but has a chads2vasc score of at least 3 and  that would not be sufficient to be protective for stroke. He feels like he has been maintaining SR, with h/o TMC, and echo will need to be repeated to see if improvement in SR.   Seen by Dr Johney Frame on 10/24/14 and is coming to establish CHF care. He denies any chest pain, dyspnea, dizziness, presyncope, syncope, or neurologic sequela. The patient is tolerating medications without difficulties and is otherwise without complaint. He has question about discontinuation of Amiodarone. The patient states that shortly after he was diagnosed with CHF he would be short of breath after walking a few steps however he started to exercise and now is able to walk to and half miles a day Nexium approximately 40 minutes.  05/14/15 - the patient was recently seen by Dr Johney Frame, he refuses BiV- ICD, he feels well, he is jogging several times per week and feels only mild DOE, no chest pain, no palpitations, syncope. No LE edema, orthopnea, PND. His most recent echocardiogram showed LVEF 25-30%.  Home Medications  Prior to Admission medications   Medication Sig Start Date End Date Taking? Authorizing Provider  amiodarone (PACERONE) 200 MG tablet Take 1 tablet (200 mg total) by mouth daily. 07/24/14  Yes Hillis Range, MD  carvedilol (COREG) 6.25 MG tablet Take 1 tablet (6.25 mg total) by mouth 2 (two) times daily with a meal. 06/14/14  Yes Janetta Hora, PA-C  Dermatological Products,  Misc. (ELETONE EX) Apply 1 application topically 2 (two) times daily as needed (eczema).   Yes Historical Provider, MD  ferrous sulfate 325 (65 FE) MG tablet Take 1 tablet (325 mg total) by mouth 2 (two) times daily with a meal. 06/19/14  Yes Iva Boop, MD  furosemide (LASIX) 40 MG tablet One tablet by mouth daily and as needed for swelling Patient taking differently: One tablet by mouth daily and you can take one extra tablet by mouth daily as needed for swelling 07/24/14  Yes Hillis Range, MD  lisinopril (PRINIVIL,ZESTRIL) 20 MG  tablet Take 1 tablet (20 mg total) by mouth daily. 10/24/14  Yes Hillis Range, MD  pantoprazole (PROTONIX) 40 MG tablet Take 1 tablet (40 mg total) by mouth daily. 06/24/14  Yes Hillis Range, MD    Family History  Family History  Problem Relation Age of Onset  . Depression Father   . Heart disease Father     Some sort of heart issue at end of life - possibly CAD  . Multiple sclerosis Mother   . Cancer Mother     Tumor in kidney, spread to bones    Social History  Social History   Social History  . Marital Status: Single    Spouse Name: N/A  . Number of Children: N/A  . Years of Education: N/A   Occupational History  . Not on file.   Social History Main Topics  . Smoking status: Never Smoker   . Smokeless tobacco: Never Used  . Alcohol Use: No  . Drug Use: No  . Sexual Activity: Not Currently   Other Topics Concern  . Not on file   Social History Narrative     Review of Systems, as per HPI, otherwise negative General:  No chills, fever, night sweats or weight changes.  Cardiovascular:  No chest pain, dyspnea on exertion, edema, orthopnea, palpitations, paroxysmal nocturnal dyspnea. Dermatological: No rash, lesions/masses Respiratory: No cough, dyspnea Urologic: No hematuria, dysuria Abdominal:   No nausea, vomiting, diarrhea, bright Sullenberger blood per rectum, melena, or hematemesis Neurologic:  No visual changes, wkns, changes in mental status. All other systems reviewed and are otherwise negative except as noted above.  Physical Exam  Blood pressure 186/100, pulse 73, height  (1.905 m), weight 262 lb (118.842 kg), SpO2 96 %.  General: Pleasant, NAD Psych: Normal affect. Neuro: Alert and oriented X 3. Moves all extremities spontaneously. HEENT: Normal  Neck: Supple without bruits or JVD. Lungs:  Resp regular and unlabored, CTA. Heart: RRR no s3, s4, 2/6 systolic murmur. Abdomen: Soft, non-tender, non-distended, BS + x 4.  Extremities: No clubbing, cyanosis  , chronic venous stasis and insufficiency. DP/PT/Radials 2+ and equal bilaterally.  Labs:  No results for input(s): CKTOTAL, CKMB, TROPONINI in the last 72 hours. Lab Results  Component Value Date   WBC 6.6 02/04/2015   HGB 14.8 02/04/2015   HCT 43.6 02/04/2015   MCV 94.5 02/04/2015   PLT 240.0 02/04/2015    No results found for: DDIMER Invalid input(s): POCBNP    Component Value Date/Time   NA 141 02/04/2015 1145   K 4.1 02/04/2015 1145   CL 107 02/04/2015 1145   CO2 29 02/04/2015 1145   GLUCOSE 119* 02/04/2015 1145   BUN 16 02/04/2015 1145   CREATININE 1.03 02/04/2015 1145   CALCIUM 9.1 02/04/2015 1145   PROT 6.7 02/04/2015 1145   ALBUMIN 3.7 02/04/2015 1145   AST 15 02/04/2015 1145   ALT 17 02/04/2015 1145  ALKPHOS 59 02/04/2015 1145   BILITOT 1.2 02/04/2015 1145   GFRNONAA 85* 06/13/2014 0407   GFRAA >90 06/13/2014 0407   Lab Results  Component Value Date   CHOL 119 06/03/2014   HDL 24* 06/03/2014   LDLCALC 80 06/03/2014   TRIG 73 06/03/2014    Accessory Clinical Findings   Echo: 06/03/2014 Study Conclusions  - Left ventricle: There is profound contractile dyssynchrony, but no clear regional wall motion abnormality. The cavity size was mildly dilated. There was moderate concentric hypertrophy. Systolic function was severely reduced. The estimated ejection fraction was in the range of 15% to 20%. Diffuse hypokinesis. Features are consistent with a pseudonormal left ventricular filling pattern, with concomitant abnormal relaxation and increased filling pressure (grade 2 diastolic dysfunction). No evidence of thrombus. - Ventricular septum: Septal motion showed abnormal function, dyssynergy, and paradox. These changes are consistent with a left bundle branch block. - Aortic valve: There was mild regurgitation. - Mitral valve: There was moderate regurgitation directed centrally. - Left atrium: The atrium was severely dilated. -  Right ventricle: The cavity size was moderately dilated. - Right atrium: The atrium was moderately to severely dilated.  TTE: 11/2014 - Left ventricle: The cavity size was normal. There was mild concentric hypertrophy. Systolic function was severely reduced. The estimated ejection fraction was in the range of 25% to 30%. Diffuse hypokinesis. Possible hypokinesis of the mid-apicalanterior and apical myocardium. This may be artifactual and related to LBBB. Doppler parameters are consistent with abnormal left ventricular relaxation (grade 1 diastolic dysfunction). Indeterminate mean left atrial pressure. - Ventricular septum: Septal motion showed abnormal function, dyssynergy, and paradox. - Aortic valve: There was mild to moderate regurgitation directed centrally in the LVOT. - Mitral valve: There was mild regurgitation. - Left atrium: The atrium was moderately dilated. - Right atrium: The atrium was mildly dilated.  Impressions: - Compared to September 2015, LVEF has improved, but remains severely depressed. There is profound systolic dyssynchrony.    Assessment and Plan:  1. Persistent afib Doing well with amiodarone, maintaining SR. The most recent TSH was normal. CHA2DS2-VASc Score 3. He had a terrible experience with GIB after starting Eliquis in the hospital Hb 16--> 8.1 requiring transfusions. He is clear in his decision to decline anticoagulation. Dr Johney Frame offered left atrial appendage occlusion device placement as an alternative.  Though he may be willing to consider this is the future, he is not ready to do so at this time.    2. 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  The most recent LVEF 25-30% - refused BiV ICD.  I will discontinue lisinopril and start Entresto 49/51 mg BID. I wanted to repeat echo in 6 months, he refuses.  3. Chronic systolic CHF - well  compensated, improving functional capacity,  As above  4. H/O GI Bleed with blood loss anemia  Continue protonix CBC today. Although Dr. Leone Payor thought he would be ok to resume NOAC, pt is very concerned re another GI bleed.  Risks discussion of stroke vs bleeding as above.  5. Swelling of RLE/ venous insufficiniency Resolved No DVT by  doppler Support hose as needed  6. Hypertensive cardiovascular disease with CHF - uncontrolled, started on Entresto today.  Follow up in 6 months.   Lars Masson, MD, [facility] 05/14/2015, 9:47 AM   Assessment and Plan:  1. Persistent afib Doing well with amiodarone Continue 200mg  daily long term Today,I had a  long discussion with pt regarding his risk of stroke with h/o afib and chads2vasc sore of at least 3.  I am concerned about his embolic risks.  This patients CHA2DS2-VASc Score and unadjusted Ischemic Stroke Rate (% per year) is equal to 3.2 % stroke rate/year from a score of 3  He is clear in his decision to decline anticoagulation  2. LV dysfunction  Previously attributed to AF with RVR though his EF has not recovered with sinus rhythm.  It may be reasonable to consider either stress testing or cath to exclude ischemia as I do not believe that he has had a prior ischemic workup.  I will defer to Dr Delton See. Today, we discussed BiV ICD implant.  He is very clear that he has not interest in device implant at this time.  He seems to be in a bit of denial about his health issues in general. Given BP 200/120, I think that he has substantial room for medicine titration. I will increase coreg to 12.5mg  BID today (He was previously taking only 6.25mg  daily). Continue to titrate coreg upon follow-up with Dr Delton See.  He would also be a candidate for corlanor or entresto.  Given his current reluctance to consider ICD, he may also be reluctant to take these new medicines also.  3. H/o GI Bleed with blood loss anemia  Resolved Could consider LAA  occlusion in the future  4. Hypertensive cardiovascular disease with CHF Uncontrolled BP.  Will increase coreg today.  Follow-up with Dr Delton See as scheduled I am happy to see again when needed

## 2015-07-28 ENCOUNTER — Telehealth: Payer: Self-pay | Admitting: Cardiology

## 2015-07-28 DIAGNOSIS — I4819 Other persistent atrial fibrillation: Secondary | ICD-10-CM

## 2015-07-28 MED ORDER — AMIODARONE HCL 200 MG PO TABS: 200.0000 mg | ORAL_TABLET | Freq: Every day | ORAL | Status: DC

## 2015-07-28 NOTE — Telephone Encounter (Signed)
New message      Requested Prescriptions    No prescriptions requested or ordered in this encounter

## 2015-07-28 NOTE — Telephone Encounter (Signed)
New message       STAT if patient is at the pharmacy , call can be transferred to refill team.   1. Which medications need to be refilled? Amiodarone 200mg  2. Which pharmacy/location is medication to be sent to?[facility]  3. Do they need a 30 day or 90 day supply? 120 day supply

## 2015-10-27 ENCOUNTER — Telehealth: Payer: Self-pay

## 2015-10-27 NOTE — Telephone Encounter (Signed)
Pt called requesting refill on Spironolactone. I cannot find it on his med list from last OV w/Nelson 05/14/15. He is requesting a 90 day supply to [facility]. Please advise

## 2015-10-28 MED ORDER — SPIRONOLACTONE 25 MG PO TABS: 25.0000 mg | ORAL_TABLET | Freq: Every day | ORAL | Status: DC

## 2015-10-28 NOTE — Telephone Encounter (Signed)
Pt is currently taking this med.  Please refill accordingly.

## 2015-10-28 NOTE — Telephone Encounter (Signed)
Clarified with pt's pharmacy that he is taking spironolactone 25 mg daily since 10/2014 by Dr Delton See and requests 90 day supply e-scribed to [facility] pharmacy

## 2015-12-01 ENCOUNTER — Encounter (INDEPENDENT_AMBULATORY_CARE_PROVIDER_SITE_OTHER): Payer: Self-pay

## 2015-12-01 ENCOUNTER — Encounter: Payer: Self-pay | Admitting: Cardiology

## 2015-12-01 ENCOUNTER — Ambulatory Visit (INDEPENDENT_AMBULATORY_CARE_PROVIDER_SITE_OTHER): Payer: BLUE CROSS/BLUE SHIELD | Admitting: Cardiology

## 2015-12-01 VITALS — BP 186/100 | HR 79 | Ht 75.0 in | Wt 264.0 lb

## 2015-12-01 DIAGNOSIS — I5022 Chronic systolic (congestive) heart failure: Secondary | ICD-10-CM

## 2015-12-01 DIAGNOSIS — I481 Persistent atrial fibrillation: Secondary | ICD-10-CM | POA: Diagnosis not present

## 2015-12-01 DIAGNOSIS — I519 Heart disease, unspecified: Secondary | ICD-10-CM

## 2015-12-01 DIAGNOSIS — I480 Paroxysmal atrial fibrillation: Secondary | ICD-10-CM

## 2015-12-01 DIAGNOSIS — I4819 Other persistent atrial fibrillation: Secondary | ICD-10-CM

## 2015-12-01 DIAGNOSIS — I110 Hypertensive heart disease with heart failure: Secondary | ICD-10-CM | POA: Diagnosis not present

## 2015-12-01 NOTE — Patient Instructions (Signed)
Medication Instructions:   Your physician recommends that you continue on your current medications as directed. Please refer to the Current Medication list given to you today.    Testing/Procedures:  Your physician has requested that you have an echocardiogram. Echocardiography is a painless test that uses sound waves to create images of your heart. It provides your doctor with information about the size and shape of your heart and how well your heart's chambers and valves are working. This procedure takes approximately one hour. There are no restrictions for this procedure.    Follow-Up:  Your physician wants you to follow-up in: 6 MONTHS WITH DR NELSON You will receive a reminder letter in the mail two months in advance. If you don't receive a letter, please call our office to schedule the follow-up appointment.      If you need a refill on your cardiac medications before your next appointment, please call your pharmacy.   

## 2015-12-01 NOTE — Progress Notes (Signed)
Patient ID: Bryan Moran, male   DOB: Apr 06, 1946, 70 y.o.   MRN: [id number]     Patient Name: Bryan Moran Date of Encounter: 12/01/2015  Primary Care Provider:  Lorenda Peck, MD Primary Cardiologist:  Tobias Alexander H/Dr Allred  Problem List   Past Medical History  Diagnosis Date  . Hypertension   . A-fib (HCC)     a. Officially dx 05/27/14, sx may have been going on longer.  . Eczema   . Dysrhythmia   . Acute duodenal ulcer with hemorrhage 06/10/2014  . Acute blood loss anemia   . Atrial fibrillation, persistent (HCC)   . Coagulopathy (HCC)   . Venous insufficiency    Past Surgical History  Procedure Laterality Date  . Hernia repair  1993    [facility]  . Tee without cardioversion N/A 06/05/2014    Procedure: TRANSESOPHAGEAL ECHOCARDIOGRAM (TEE);  Surgeon: Quintella Reichert, MD;  Location: [facility] ENDOSCOPY;  Service: Cardiovascular;  Laterality: N/A;  . Cardioversion N/A 06/05/2014    Procedure: CARDIOVERSION;  Surgeon: Quintella Reichert, MD;  Location: [facility] ENDOSCOPY;  Service: Cardiovascular;  Laterality: N/A;  . Esophagogastroduodenoscopy N/A 06/07/2014    Procedure: ESOPHAGOGASTRODUODENOSCOPY (EGD);  Surgeon: Theda Belfast, MD;  Location: [facility] ENDOSCOPY;  Service: Endoscopy;  Laterality: N/A;  . Esophagogastroduodenoscopy N/A 06/12/2014    Procedure: ESOPHAGOGASTRODUODENOSCOPY (EGD);  Surgeon: Iva Boop, MD;  Location: [facility] ENDOSCOPY;  Service: Endoscopy;  Laterality: N/A;   Allergies  No Known Allergies  HPI  Bryan Moran is a 70 y.o. male presents today for EP follow-up.  He is maintaining sinus rhythm on amiodarone and feels very well, walking up to 2 miles a day. Marland Kitchen  His R leg edema hs resolved. No further gi issues with bleeding. He saw his GI doctor a few months back and  He thought it would be ok to restart NOAC, however pt  is adamant that he will not take any anticoagulants due to GI bleed in the fall on eliquis. He would consider a baby asa, but has a chads2vasc score  of at least 3 and that would not be sufficient to be protective for stroke. He feels like he has been maintaining SR, with h/o TMC, and echo will need to be repeated to see if improvement in SR.   Seen by Dr Johney Frame on 10/24/14 and is coming to establish CHF care. He denies any chest pain, dyspnea, dizziness, presyncope, syncope, or neurologic sequela. The patient is tolerating medications without difficulties and is otherwise without complaint. He has question about discontinuation of Amiodarone. The patient states that shortly after he was diagnosed with CHF he would be short of breath after walking a few steps however he started to exercise and now is able to walk to and half miles a day Nexium approximately 40 minutes.  12/01/2015 - she is coming after 6 months, he feels well and is able to walk and even run 2 miles a day without significant difficulties. He was seen by Dr. Johney Frame in the past but refused BiV- ICD, mostly because of financial concern and significant co-pays in the past. He denies palpitations or syncope. At the last visit he was found hypertensive and his lisinopril was switched to [facility], however he stop using it as he became significantly hypotensive and lightheaded. He states that at home his blood pressure is in 120s but in our office it's always measured high.  His most recent echocardiogram  performed in March 2016 showed LVEF 25-30%.  Home Medications  Prior to Admission medications   Medication Sig Start Date End Date Taking? Authorizing Provider  amiodarone (PACERONE) 200 MG tablet Take 1 tablet (200 mg total) by mouth daily. 07/24/14  Yes Hillis Range, MD  carvedilol (COREG) 6.25 MG tablet Take 1 tablet (6.25 mg total) by mouth 2 (two) times daily with a meal. 06/14/14  Yes Janetta Hora, PA-C  Dermatological Products, Misc. (ELETONE EX) Apply 1 application topically 2 (two) times daily as needed (eczema).   Yes Historical Provider, MD  ferrous sulfate 325 (65 FE) MG  tablet Take 1 tablet (325 mg total) by mouth 2 (two) times daily with a meal. 06/19/14  Yes Iva Boop, MD  furosemide (LASIX) 40 MG tablet One tablet by mouth daily and as needed for swelling Patient taking differently: One tablet by mouth daily and you can take one extra tablet by mouth daily as needed for swelling 07/24/14  Yes Hillis Range, MD  lisinopril (PRINIVIL,ZESTRIL) 20 MG tablet Take 1 tablet (20 mg total) by mouth daily. 10/24/14  Yes Hillis Range, MD  pantoprazole (PROTONIX) 40 MG tablet Take 1 tablet (40 mg total) by mouth daily. 06/24/14  Yes Hillis Range, MD    Family History  Family History  Problem Relation Age of Onset  . Depression Father   . Heart disease Father     Some sort of heart issue at end of life - possibly CAD  . Multiple sclerosis Mother   . Cancer Mother     Tumor in kidney, spread to bones    Social History  Social History   Social History  . Marital Status: Single    Spouse Name: N/A  . Number of Children: N/A  . Years of Education: N/A   Occupational History  . Not on file.   Social History Main Topics  . Smoking status: Never Smoker   . Smokeless tobacco: Never Used  . Alcohol Use: No  . Drug Use: No  . Sexual Activity: Not Currently   Other Topics Concern  . Not on file   Social History Narrative     Review of Systems, as per HPI, otherwise negative General:  No chills, fever, night sweats or weight changes.  Cardiovascular:  No chest pain, dyspnea on exertion, edema, orthopnea, palpitations, paroxysmal nocturnal dyspnea. Dermatological: No rash, lesions/masses Respiratory: No cough, dyspnea Urologic: No hematuria, dysuria Abdominal:   No nausea, vomiting, diarrhea, bright Bryan Moran blood per rectum, melena, or hematemesis Neurologic:  No visual changes, wkns, changes in mental status. All other systems reviewed and are otherwise negative except as noted above.  Physical Exam  Height 6\' 3"  (1.905 m).  General: Pleasant,  NAD Psych: Normal affect. Neuro: Alert and oriented X 3. Moves all extremities spontaneously. HEENT: Normal  Neck: Supple without bruits or JVD. Lungs:  Resp regular and unlabored, CTA. Heart: RRR no s3, s4, 2/6 systolic murmur. Abdomen: Soft, non-tender, non-distended, BS + x 4.  Extremities: No clubbing, cyanosis , chronic venous stasis and insufficiency. DP/PT/Radials 2+ and equal bilaterally.  Labs:  No results for input(s): CKTOTAL, CKMB, TROPONINI in the last 72 hours. Lab Results  Component Value Date   WBC 6.6 02/04/2015   HGB 14.8 02/04/2015   HCT 43.6 02/04/2015   MCV 94.5 02/04/2015   PLT 240.0 02/04/2015    No results found for: DDIMER Invalid input(s): POCBNP    Component Value Date/Time   NA 140 05/14/2015 1025   K 4.0 05/14/2015 1025   CL  105 05/14/2015 1025   CO2 28 05/14/2015 1025   GLUCOSE 92 05/14/2015 1025   BUN 15 05/14/2015 1025   CREATININE 1.15 05/14/2015 1025   CALCIUM 9.2 05/14/2015 1025   PROT 7.0 05/14/2015 1025   ALBUMIN 4.0 05/14/2015 1025   AST 13 05/14/2015 1025   ALT 12 05/14/2015 1025   ALKPHOS 56 05/14/2015 1025   BILITOT 1.1 05/14/2015 1025   GFRNONAA 85* 06/13/2014 0407   GFRAA >90 06/13/2014 0407   Lab Results  Component Value Date   CHOL 119 06/03/2014   HDL 24* 06/03/2014   LDLCALC 80 06/03/2014   TRIG 73 06/03/2014    Accessory Clinical Findings   Echo: 06/03/2014 Study Conclusions  - Left ventricle: There is profound contractile dyssynchrony, but no clear regional wall motion abnormality. The cavity size was mildly dilated. There was moderate concentric hypertrophy. Systolic function was severely reduced. The estimated ejection fraction was in the range of 15% to 20%. Diffuse hypokinesis. Features are consistent with a pseudonormal left ventricular filling pattern, with concomitant abnormal relaxation and increased filling pressure (grade 2 diastolic dysfunction). No evidence of thrombus. -  Ventricular septum: Septal motion showed abnormal function, dyssynergy, and paradox. These changes are consistent with a left bundle branch block. - Aortic valve: There was mild regurgitation. - Mitral valve: There was moderate regurgitation directed centrally. - Left atrium: The atrium was severely dilated. - Right ventricle: The cavity size was moderately dilated. - Right atrium: The atrium was moderately to severely dilated.  TTE: 11/2014 - Left ventricle: The cavity size was normal. There was mild concentric hypertrophy. Systolic function was severely reduced. The estimated ejection fraction was in the range of 25% to 30%. Diffuse hypokinesis. Possible hypokinesis of the mid-apicalanterior and apical myocardium. This may be artifactual and related to LBBB. Doppler parameters are consistent with abnormal left ventricular relaxation (grade 1 diastolic dysfunction). Indeterminate mean left atrial pressure. - Ventricular septum: Septal motion showed abnormal function, dyssynergy, and paradox. - Aortic valve: There was mild to moderate regurgitation directed centrally in the LVOT. - Mitral valve: There was mild regurgitation. - Left atrium: The atrium was moderately dilated. - Right atrium: The atrium was mildly dilated.  Impressions: - Compared to September 2015, LVEF has improved, but remains severely depressed. There is profound systolic dyssynchrony.    Assessment and Plan:  1. Persistent afib Doing well with amiodarone, maintaining SR. The most recent TSH was normal. CHA2DS2-VASc Score 3. He had a terrible experience with GIB after starting Eliquis in the hospital Hb 16--> 8.1 requiring transfusions. He is clear in his decision to decline anticoagulation. Dr Johney Frame offered left atrial appendage occlusion device placement as an alternative.  Though he may be willing to consider this is the future, he is not ready to do so at this time.    2. 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  The most recent LVEF 25-30% - refused BiV ICD.  I will discontinue lisinopril and start Entresto 49/51 mg BID. I wanted to repeat echo in 6 months, he refuses.  3. Chronic systolic CHF - well compensated, improving functional capacity,  As above  4. H/O GI Bleed with blood loss anemia  Continue protonix CBC today. Although Dr. Leone Payor thought he would be ok to resume NOAC, pt is very concerned re another GI bleed.  Risks discussion of stroke vs bleeding as above.  5. Swelling of RLE/ venous insufficiniency Resolved  No DVT by  doppler Support hose as needed  6. Hypertensive cardiovascular disease with CHF - uncontrolled, started on Entresto today.  Follow up in 6 months.   Bryan Masson, MD, [facility] 12/01/2015, 2:29 PM   Assessment and Plan:  1. Paroxysmal atrial fibrillation - Doing well with amiodarone, currently in sinus rhythm, continue  daily long term. He refuses to take anticoagulation considering prior significant GI bleed.  CHADS2-VASc score is 3.  I am concerned about his embolic risks. He is clear in his decision to decline anticoagulation.  2. LV dysfunction  Previously attributed to AF with RVR though his EF has not recovered with sinus rhythm. He is currently asymptomatic and euvolemic. He hasn't had an echocardiogram for a year as he is concerned about co-pay. We'll order today and based on Price he will decide whether to undergo or not. He previously denied by the ICD but would considering again the main concern is the price. He is currently NYHA class I. We'll continue carvedilol and lisinopril, spironolactone and furosemide.  3. H/o GI Bleed with blood loss anemia  Resolved Could consider LAA occlusion in the future.   4. Hypertensive cardiovascular disease with CHF Uncontrolled BP.Restarted lisinopril 40 mg daily.    Follow-up in 6 months.  Bryan Masson, MD  12/01/2015

## 2015-12-17 ENCOUNTER — Other Ambulatory Visit (HOSPITAL_COMMUNITY): Payer: BLUE CROSS/BLUE SHIELD

## 2016-01-30 ENCOUNTER — Other Ambulatory Visit: Payer: Self-pay | Admitting: *Deleted

## 2016-01-30 DIAGNOSIS — I4819 Other persistent atrial fibrillation: Secondary | ICD-10-CM

## 2016-01-30 MED ORDER — AMIODARONE HCL 200 MG PO TABS: 200.0000 mg | ORAL_TABLET | Freq: Every day | ORAL | Status: DC

## 2016-03-25 ENCOUNTER — Other Ambulatory Visit: Payer: Self-pay

## 2016-03-25 DIAGNOSIS — I480 Paroxysmal atrial fibrillation: Secondary | ICD-10-CM

## 2016-03-25 MED ORDER — CARVEDILOL 12.5 MG PO TABS: 12.5000 mg | ORAL_TABLET | Freq: Two times a day (BID) | ORAL | Status: DC

## 2016-03-25 NOTE — Telephone Encounter (Signed)
Pt should be taking 12.5 mg po BID, for this is his current medication of coreg.  Please fill this accordingly.  Thanks!

## 2016-04-29 ENCOUNTER — Other Ambulatory Visit: Payer: Self-pay | Admitting: Cardiology

## 2016-04-29 DIAGNOSIS — I4819 Other persistent atrial fibrillation: Secondary | ICD-10-CM

## 2016-04-29 MED ORDER — AMIODARONE HCL 200 MG PO TABS: 200.0000 mg | ORAL_TABLET | Freq: Every day | ORAL | 2 refills | Status: DC

## 2016-05-19 ENCOUNTER — Encounter: Payer: Self-pay | Admitting: *Deleted

## 2016-06-02 ENCOUNTER — Encounter (INDEPENDENT_AMBULATORY_CARE_PROVIDER_SITE_OTHER): Payer: Self-pay

## 2016-06-02 ENCOUNTER — Encounter: Payer: Self-pay | Admitting: Cardiology

## 2016-06-02 ENCOUNTER — Ambulatory Visit (INDEPENDENT_AMBULATORY_CARE_PROVIDER_SITE_OTHER): Payer: BLUE CROSS/BLUE SHIELD | Admitting: Cardiology

## 2016-06-02 VITALS — BP 146/92 | HR 63 | Ht 75.0 in | Wt 264.0 lb

## 2016-06-02 DIAGNOSIS — I481 Persistent atrial fibrillation: Secondary | ICD-10-CM | POA: Diagnosis not present

## 2016-06-02 DIAGNOSIS — I480 Paroxysmal atrial fibrillation: Secondary | ICD-10-CM

## 2016-06-02 DIAGNOSIS — D62 Acute posthemorrhagic anemia: Secondary | ICD-10-CM

## 2016-06-02 DIAGNOSIS — I10 Essential (primary) hypertension: Secondary | ICD-10-CM

## 2016-06-02 DIAGNOSIS — I872 Venous insufficiency (chronic) (peripheral): Secondary | ICD-10-CM

## 2016-06-02 DIAGNOSIS — I519 Heart disease, unspecified: Secondary | ICD-10-CM | POA: Diagnosis not present

## 2016-06-02 DIAGNOSIS — I447 Left bundle-branch block, unspecified: Secondary | ICD-10-CM | POA: Diagnosis not present

## 2016-06-02 DIAGNOSIS — I4819 Other persistent atrial fibrillation: Secondary | ICD-10-CM

## 2016-06-02 DIAGNOSIS — I110 Hypertensive heart disease with heart failure: Secondary | ICD-10-CM

## 2016-06-02 LAB — CBC WITH DIFFERENTIAL/PLATELET
Basophils Absolute: 0 cells/uL (ref 0–200)
Basophils Relative: 0 %
Eosinophils Absolute: 210 cells/uL (ref 15–500)
Eosinophils Relative: 3 %
HCT: 43 % (ref 38.5–50.0)
Hemoglobin: 14.7 g/dL (ref 13.2–17.1)
Lymphocytes Relative: 19 %
Lymphs Abs: 1330 cells/uL (ref 850–3900)
MCH: 31.9 pg (ref 27.0–33.0)
MCHC: 34.2 g/dL (ref 32.0–36.0)
MCV: 93.3 fL (ref 80.0–100.0)
MPV: 10.7 fL (ref 7.5–12.5)
Monocytes Absolute: 560 cells/uL (ref 200–950)
Monocytes Relative: 8 %
Neutro Abs: 4900 cells/uL (ref 1500–7800)
Neutrophils Relative %: 70 %
Platelets: 274 10*3/uL (ref 140–400)
RBC: 4.61 MIL/uL (ref 4.20–5.80)
RDW: 13.2 % (ref 11.0–15.0)
WBC: 7 10*3/uL (ref 3.8–10.8)

## 2016-06-02 LAB — COMPREHENSIVE METABOLIC PANEL
ALT: 11 U/L (ref 9–46)
AST: 12 U/L (ref 10–35)
Albumin: 4 g/dL (ref 3.6–5.1)
Alkaline Phosphatase: 50 U/L (ref 40–115)
BUN: 20 mg/dL (ref 7–25)
CO2: 26 mmol/L (ref 20–31)
Calcium: 9 mg/dL (ref 8.6–10.3)
Chloride: 106 mmol/L (ref 98–110)
Creat: 1.12 mg/dL (ref 0.70–1.25)
Glucose, Bld: 98 mg/dL (ref 65–99)
Potassium: 4.5 mmol/L (ref 3.5–5.3)
Sodium: 141 mmol/L (ref 135–146)
Total Bilirubin: 1.3 mg/dL — ABNORMAL HIGH (ref 0.2–1.2)
Total Protein: 6.7 g/dL (ref 6.1–8.1)

## 2016-06-02 LAB — TSH: TSH: 4.49 mIU/L (ref 0.40–4.50)

## 2016-06-02 LAB — LIPID PANEL
Cholesterol: 182 mg/dL (ref 125–200)
HDL: 44 mg/dL (ref >=40)
LDL Cholesterol: 115 mg/dL (ref <130)
Total CHOL/HDL Ratio: 4.1 Ratio (ref <=5.0)
Triglycerides: 115 mg/dL (ref <150)
VLDL: 23 mg/dL (ref <30)

## 2016-06-02 NOTE — Patient Instructions (Signed)
Medication Instructions:   Your physician recommends that you continue on your current medications as directed. Please refer to the Current Medication list given to you today.    Labwork:  TODAY--CMET, CBC W DIFF, TSH, AND LIPIDS      Follow-Up:  Your physician wants you to follow-up in: 6 MONTHS WITH DR NELSON You will receive a reminder letter in the mail two months in advance. If you don't receive a letter, please call our office to schedule the follow-up appointment.        If you need a refill on your cardiac medications before your next appointment, please call your pharmacy.   

## 2016-06-02 NOTE — Progress Notes (Addendum)
Patient ID: Bryan Moran, male   DOB: 08-12-1946, 70 y.o.   MRN: [id number]     Patient Name: Bryan Moran Date of Encounter: 06/02/2016  Primary Care Provider:  Lorenda Peck, MD Primary Cardiologist:  Aris Lot Tequan Redmon/Dr Allred  Problem List   Past Medical History:  Diagnosis Date  . A-fib (HCC)    a. Officially dx 05/27/14, sx may have been going on longer.  . Acute blood loss anemia   . Acute duodenal ulcer with hemorrhage 06/10/2014  . Atrial fibrillation, persistent (HCC)   . Coagulopathy (HCC)   . Dysrhythmia   . Eczema   . Hypertension   . Venous insufficiency    Past Surgical History:  Procedure Laterality Date  . CARDIOVERSION N/A 06/05/2014   Procedure: CARDIOVERSION;  Surgeon: Quintella Reichert, MD;  Location: [facility] ENDOSCOPY;  Service: Cardiovascular;  Laterality: N/A;  . ESOPHAGOGASTRODUODENOSCOPY N/A 06/07/2014   Procedure: ESOPHAGOGASTRODUODENOSCOPY (EGD);  Surgeon: Theda Belfast, MD;  Location: [facility] ENDOSCOPY;  Service: Endoscopy;  Laterality: N/A;  . ESOPHAGOGASTRODUODENOSCOPY N/A 06/12/2014   Procedure: ESOPHAGOGASTRODUODENOSCOPY (EGD);  Surgeon: Iva Boop, MD;  Location: [facility] ENDOSCOPY;  Service: Endoscopy;  Laterality: N/A;  . HERNIA REPAIR  1993   [facility]  . TEE WITHOUT CARDIOVERSION N/A 06/05/2014   Procedure: TRANSESOPHAGEAL ECHOCARDIOGRAM (TEE);  Surgeon: Quintella Reichert, MD;  Location: [facility] ENDOSCOPY;  Service: Cardiovascular;  Laterality: N/A;   Allergies  No Known Allergies  HPI  Bryan Moran is a 70 y.o. male presents today for EP follow-up.  He is maintaining sinus rhythm on amiodarone and feels very well, walking up to 2 miles a day. Marland Kitchen  His R leg edema hs resolved. No further gi issues with bleeding. He saw his GI doctor a few months back and  He thought it would be ok to restart NOAC, however pt  is adamant that he will not take any anticoagulants due to GI bleed in the fall on eliquis. He would consider a baby asa, but has a chads2vasc score of at  least 3 and that would not be sufficient to be protective for stroke. He feels like he has been maintaining SR, with h/o TMC, and echo will need to be repeated to see if improvement in SR.   Seen by Dr Johney Frame on 10/24/14 and is coming to establish CHF care. He denies any chest pain, dyspnea, dizziness, presyncope, syncope, or neurologic sequela. The patient is tolerating medications without difficulties and is otherwise without complaint. He has question about discontinuation of Amiodarone. The patient states that shortly after he was diagnosed with CHF he would be short of breath after walking a few steps however he started to exercise and now is able to walk to and half miles a day Nexium approximately 40 minutes.  12/01/2015 - she is coming after 6 months, he feels well and is able to walk and even run 2 miles a day without significant difficulties. He was seen by Dr. Johney Frame in the past but refused BiV- ICD, mostly because of financial concern and significant co-pays in the past. He denies palpitations or syncope. At the last visit he was found hypertensive and his lisinopril was switched to [facility], however he stop using it as he became significantly hypotensive and lightheaded. He states that at home his blood pressure is in 120s but in our office it's always measured high.  His most recent echocardiogram  performed in March 2016 showed LVEF 25-30%.  06/02/2016 - [name] is coming after  6 months he states that he feels great, he started running again around 3 times a week for miles. He denies any chest pain or significant shortness of breath with that. It takes him approximately 50 minutes to run 4 miles. He denies any palpitations or syncope. He also denies lower extremity edema orthopnea or paroxysmal nocturnal dyspnea. At the last visit we started him on Entresto but his blood pressure became very low and he was symptomatic with hypotension so he started to take back to his usual dose of lisinopril  40 mg daily.  Home Medications  Prior to Admission medications   Medication Sig Start Date End Date Taking? Authorizing Provider  amiodarone (PACERONE) 200 MG tablet Take 1 tablet (200 mg total) by mouth daily. 07/24/14  Yes Hillis Range, MD  carvedilol (COREG) 6.25 MG tablet Take 1 tablet (6.25 mg total) by mouth 2 (two) times daily with a meal. 06/14/14  Yes Janetta Hora, PA-C  Dermatological Products, Misc. (ELETONE EX) Apply 1 application topically 2 (two) times daily as needed (eczema).   Yes Historical Provider, MD  ferrous sulfate 325 (65 FE) MG tablet Take 1 tablet (325 mg total) by mouth 2 (two) times daily with a meal. 06/19/14  Yes Iva Boop, MD  furosemide (LASIX) 40 MG tablet One tablet by mouth daily and as needed for swelling Patient taking differently: One tablet by mouth daily and you can take one extra tablet by mouth daily as needed for swelling 07/24/14  Yes Hillis Range, MD  lisinopril (PRINIVIL,ZESTRIL) 20 MG tablet Take 1 tablet (20 mg total) by mouth daily. 10/24/14  Yes Hillis Range, MD  pantoprazole (PROTONIX) 40 MG tablet Take 1 tablet (40 mg total) by mouth daily. 06/24/14  Yes Hillis Range, MD    Family History  Family History  Problem Relation Age of Onset  . Depression Father   . Heart disease Father     Some sort of heart issue at end of life - possibly CAD  . Multiple sclerosis Mother   . Cancer Mother     Tumor in kidney, spread to bones    Social History  Social History   Social History  . Marital status: Single    Spouse name: N/A  . Number of children: N/A  . Years of education: N/A   Occupational History  . Not on file.   Social History Main Topics  . Smoking status: Never Smoker  . Smokeless tobacco: Never Used  . Alcohol use No  . Drug use: No  . Sexual activity: Not Currently   Other Topics Concern  . Not on file   Social History Narrative  . No narrative on file     Review of Systems, as per HPI, otherwise  negative General:  No chills, fever, night sweats or weight changes.  Cardiovascular:  No chest pain, dyspnea on exertion, edema, orthopnea, palpitations, paroxysmal nocturnal dyspnea. Dermatological: No rash, lesions/masses Respiratory: No cough, dyspnea Urologic: No hematuria, dysuria Abdominal:   No nausea, vomiting, diarrhea, bright [place] blood per rectum, melena, or hematemesis Neurologic:  No visual changes, wkns, changes in mental status. All other systems reviewed and are otherwise negative except as noted above.  Physical Exam  Blood pressure (!) 146/92, pulse 63, height 6\' 3"  (1.905 m), weight 264 lb (119.7 kg), SpO2 95 %.  General: Pleasant, NAD Psych: Normal affect. Neuro: Alert and oriented X 3. Moves all extremities spontaneously. HEENT: Normal  Neck: Supple without bruits or JVD. Lungs:  Resp regular and unlabored, CTA. Heart: RRR no s3, s4, 2/6 systolic murmur. Abdomen: Soft, non-tender, non-distended, BS + x 4.  Extremities: No clubbing, cyanosis , chronic venous stasis and insufficiency. DP/PT/Radials 2+ and equal bilaterally.  Labs:  No results for input(s): CKTOTAL, CKMB, TROPONINI in the last 72 hours. Lab Results  Component Value Date   WBC 6.6 02/04/2015   HGB 14.8 02/04/2015   HCT 43.6 02/04/2015   MCV 94.5 02/04/2015   PLT 240.0 02/04/2015    No results found for: DDIMER Invalid input(s): POCBNP    Component Value Date/Time   NA 140 05/14/2015 1025   K 4.0 05/14/2015 1025   CL 105 05/14/2015 1025   CO2 28 05/14/2015 1025   GLUCOSE 92 05/14/2015 1025   BUN 15 05/14/2015 1025   CREATININE 1.15 05/14/2015 1025   CALCIUM 9.2 05/14/2015 1025   PROT 7.0 05/14/2015 1025   ALBUMIN 4.0 05/14/2015 1025   AST 13 05/14/2015 1025   ALT 12 05/14/2015 1025   ALKPHOS 56 05/14/2015 1025   BILITOT 1.1 05/14/2015 1025   GFRNONAA 85 (L) 06/13/2014 0407   GFRAA >90 06/13/2014 0407   Lab Results  Component Value Date   CHOL 119 06/03/2014   HDL 24 (L)  06/03/2014   LDLCALC 80 06/03/2014   TRIG 73 06/03/2014    Accessory Clinical Findings   Echo: 06/03/2014 Study Conclusions  - Left ventricle: There is profound contractile dyssynchrony, but no clear regional wall motion abnormality. The cavity size was mildly dilated. There was moderate concentric hypertrophy. Systolic function was severely reduced. The estimated ejection fraction was in the range of 15% to 20%. Diffuse hypokinesis. Features are consistent with a pseudonormal left ventricular filling pattern, with concomitant abnormal relaxation and increased filling pressure (grade 2 diastolic dysfunction). No evidence of thrombus. - Ventricular septum: Septal motion showed abnormal function, dyssynergy, and paradox. These changes are consistent with a left bundle branch block. - Aortic valve: There was mild regurgitation. - Mitral valve: There was moderate regurgitation directed centrally. - Left atrium: The atrium was severely dilated. - Right ventricle: The cavity size was moderately dilated. - Right atrium: The atrium was moderately to severely dilated.  TTE: 11/2014 - Left ventricle: The cavity size was normal. There was mild concentric hypertrophy. Systolic function was severely reduced. The estimated ejection fraction was in the range of 25% to 30%. Diffuse hypokinesis. Possible hypokinesis of the mid-apicalanterior and apical myocardium. This may be artifactual and related to LBBB. Doppler parameters are consistent with abnormal left ventricular relaxation (grade 1 diastolic dysfunction). Indeterminate mean left atrial pressure. - Ventricular septum: Septal motion showed abnormal function, dyssynergy, and paradox. - Aortic valve: There was mild to moderate regurgitation directed centrally in the LVOT. - Mitral valve: There was mild regurgitation. - Left atrium: The atrium was moderately dilated. - Right atrium: The atrium was  mildly dilated.  Impressions: - Compared to September 2015, LVEF has improved, but remains severely depressed. There is profound systolic dyssynchrony.    Assessment and Plan:  1. Persistent afib Doing well with amiodarone, maintaining SR. The most recent TSH was normal. CHA2DS2-VASc Score 3. He had a terrible experience with GIB after starting Eliquis in the hospital Hb 16--> 8.1 requiring transfusions. He is clear in his decision to decline anticoagulation. Dr Johney FrameAllred offered left atrial appendage occlusion device placement as an alternative.  Though he may be willing to consider this is the future, he is not ready to do so at this time.  His  major concern a high cost or advanced therapies and would rather wait for now.  2. 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  The most recent LVEF 25-30% - refused BiV ICD. Again today he was explained risk of not having an ICD with low EF states that he feels well he is willing to take the risk and would rather wait right now. He doesn't want to have a repeat echocardiogram as he states he feels well his co-pay last time was high so he is afraid of that.  3. Chronic systolic CHF, NYHA I - well compensated, improving functional capacity,  As above  4. H/O GI Bleed with blood loss anemia  Continue protonix, no further bleeding repeat hemoglobin in May 2016 was 14.8. CBC today. Although Dr. Leone Payor thought he would be ok to resume NOAC, pt is very concerned re another GI bleed.  Risks discussion of stroke vs bleeding as above.  5. Swelling of RLE/ venous insufficiniency Resolved No DVT by  doppler Support hose as needed  6. Hypertensive cardiovascular disease with CHF - repeated blood pressure today 130/85, I would continue the same management.  Follow up in 6 months.   Tobias Alexander, MD, [facility] 06/02/2016, 11:26 AM

## 2016-09-07 ENCOUNTER — Other Ambulatory Visit: Payer: Self-pay | Admitting: Cardiology

## 2016-09-07 DIAGNOSIS — I480 Paroxysmal atrial fibrillation: Secondary | ICD-10-CM

## 2016-09-07 MED ORDER — CARVEDILOL 12.5 MG PO TABS: 12.5000 mg | ORAL_TABLET | Freq: Two times a day (BID) | ORAL | 3 refills | Status: DC

## 2016-09-07 MED ORDER — LISINOPRIL 40 MG PO TABS: 40.0000 mg | ORAL_TABLET | Freq: Every day | ORAL | 3 refills | Status: DC

## 2016-09-07 MED ORDER — SPIRONOLACTONE 25 MG PO TABS: 25.0000 mg | ORAL_TABLET | Freq: Every day | ORAL | 3 refills | Status: DC

## 2017-02-17 ENCOUNTER — Ambulatory Visit (INDEPENDENT_AMBULATORY_CARE_PROVIDER_SITE_OTHER): Payer: BLUE CROSS/BLUE SHIELD | Admitting: Cardiology

## 2017-02-17 ENCOUNTER — Encounter: Payer: Self-pay | Admitting: Cardiology

## 2017-02-17 VITALS — BP 126/82 | HR 78 | Ht 75.0 in | Wt 257.0 lb

## 2017-02-17 DIAGNOSIS — I872 Venous insufficiency (chronic) (peripheral): Secondary | ICD-10-CM | POA: Diagnosis not present

## 2017-02-17 DIAGNOSIS — I10 Essential (primary) hypertension: Secondary | ICD-10-CM | POA: Diagnosis not present

## 2017-02-17 DIAGNOSIS — I480 Paroxysmal atrial fibrillation: Secondary | ICD-10-CM | POA: Diagnosis not present

## 2017-02-17 DIAGNOSIS — I447 Left bundle-branch block, unspecified: Secondary | ICD-10-CM | POA: Diagnosis not present

## 2017-02-17 DIAGNOSIS — I519 Heart disease, unspecified: Secondary | ICD-10-CM

## 2017-02-17 MED ORDER — AMIODARONE HCL 200 MG PO TABS: 200.0000 mg | ORAL_TABLET | Freq: Every day | ORAL | 2 refills | Status: DC

## 2017-02-17 MED ORDER — FUROSEMIDE 40 MG PO TABS: 40.0000 mg | ORAL_TABLET | Freq: Every day | ORAL | 6 refills | Status: DC | PRN

## 2017-02-17 MED ORDER — CARVEDILOL 12.5 MG PO TABS: 12.5000 mg | ORAL_TABLET | Freq: Two times a day (BID) | ORAL | 3 refills | Status: DC

## 2017-02-17 MED ORDER — LISINOPRIL 40 MG PO TABS: 40.0000 mg | ORAL_TABLET | Freq: Every day | ORAL | 3 refills | Status: DC

## 2017-02-17 MED ORDER — SPIRONOLACTONE 25 MG PO TABS: 25.0000 mg | ORAL_TABLET | Freq: Every day | ORAL | 3 refills | Status: DC

## 2017-02-17 NOTE — Patient Instructions (Signed)

## 2017-02-17 NOTE — Progress Notes (Signed)
Patient ID: Bryan Moran, male   DOB: 24-Dec-1945, 71 y.o.   MRN: [id number]     Patient Name: Bryan Moran Date of Encounter: 02/17/2017  Primary Care Provider:  Burton Apley, MD Primary Cardiologist:  Aris Lot Kenishia Plack/Dr Allred  Problem List   Past Medical History:  Diagnosis Date  . A-fib (HCC)    a. Officially dx 05/27/14, sx may have been going on longer.  . Acute blood loss anemia   . Acute duodenal ulcer with hemorrhage 06/10/2014  . Atrial fibrillation, persistent (HCC)   . Coagulopathy (HCC)   . Dysrhythmia   . Eczema   . Hypertension   . Venous insufficiency    Past Surgical History:  Procedure Laterality Date  . CARDIOVERSION N/A 06/05/2014   Procedure: CARDIOVERSION;  Surgeon: Quintella Reichert, MD;  Location: [facility] ENDOSCOPY;  Service: Cardiovascular;  Laterality: N/A;  . ESOPHAGOGASTRODUODENOSCOPY N/A 06/07/2014   Procedure: ESOPHAGOGASTRODUODENOSCOPY (EGD);  Surgeon: Theda Belfast, MD;  Location: [facility] ENDOSCOPY;  Service: Endoscopy;  Laterality: N/A;  . ESOPHAGOGASTRODUODENOSCOPY N/A 06/12/2014   Procedure: ESOPHAGOGASTRODUODENOSCOPY (EGD);  Surgeon: Iva Boop, MD;  Location[facility] ENDOSCOPY;  Service: Endoscopy;  Laterality: N/A;  . HERNIA REPAIR  1993   [facility]  . TEE WITHOUT CARDIOVERSION N/A 06/05/2014   Procedure: TRANSESOPHAGEAL ECHOCARDIOGRAM (TEE);  Surgeon: Quintella Reichert, MD;  Location: [facility] ENDOSCOPY;  Service: Cardiovascular;  Laterality: N/A;   Allergies  No Known Allergies  HPI  [name] is a 71 y.o. male presents today for EP follow-up.  He is maintaining sinus rhythm on amiodarone and feels very well, walking up to 2 miles a day. Marland Kitchen  His R leg edema hs resolved. No further gi issues with bleeding. He saw his GI doctor a few months back and  He thought it would be ok to restart NOAC, however pt  is adamant that he will not take any anticoagulants due to GI bleed in the fall on eliquis. He would consider a baby asa, but has a chads2vasc score of at least  3 and that would not be sufficient to be protective for stroke. He feels like he has been maintaining SR, with h/o TMC, and echo will need to be repeated to see if improvement in SR.   Seen by Dr Johney Frame on 10/24/14 and is coming to establish CHF care. He denies any chest pain, dyspnea, dizziness, presyncope, syncope, or neurologic sequela. The patient is tolerating medications without difficulties and is otherwise without complaint. He has question about discontinuation of Amiodarone. The patient states that shortly after he was diagnosed with CHF he would be short of breath after walking a few steps however he started to exercise and now is able to walk to and half miles a day Nexium approximately 40 minutes.  12/01/2015 - she is coming after 6 months, he feels well and is able to walk and even run 2 miles a day without significant difficulties. He was seen by Dr. Johney Frame in the past but refused BiV- ICD, mostly because of financial concern and significant co-pays in the past. He denies palpitations or syncope. At the last visit he was found hypertensive and his lisinopril was switched to [facility], however he stop using it as he became significantly hypotensive and lightheaded. He states that at home his blood pressure is in 120s but in our office it's always measured high.  His most recent echocardiogram  performed in March 2016 showed LVEF 25-30%.  02/17/2017  - 6 months follow-up the patient  is feeling and looking great, he started to run short distances and would like to increase it. He denies any chest pain shortness of breath no palpitations dizziness or syncope. He is complaining of weak legs sometimes give him part-time with his balance.  He denies any lower extremity edema other than chronic markers wanes, no orthopnea paroxysmal nocturnal dyspnea. He is being compliant with his meds.   Home Medications  Prior to Admission medications   Medication Sig Start Date End Date Taking? Authorizing  Provider  amiodarone (PACERONE) 200 MG tablet Take 1 tablet (200 mg total) by mouth daily. 07/24/14  Yes Hillis Range, MD  carvedilol (COREG) 6.25 MG tablet Take 1 tablet (6.25 mg total) by mouth 2 (two) times daily with a meal. 06/14/14  Yes Janetta Hora, PA-C  Dermatological Products, Misc. (ELETONE EX) Apply 1 application topically 2 (two) times daily as needed (eczema).   Yes Historical Provider, MD  ferrous sulfate 325 (65 FE) MG tablet Take 1 tablet (325 mg total) by mouth 2 (two) times daily with a meal. 06/19/14  Yes Iva Boop, MD  furosemide (LASIX) 40 MG tablet One tablet by mouth daily and as needed for swelling Patient taking differently: One tablet by mouth daily and you can take one extra tablet by mouth daily as needed for swelling 07/24/14  Yes Hillis Range, MD  lisinopril (PRINIVIL,ZESTRIL) 20 MG tablet Take 1 tablet (20 mg total) by mouth daily. 10/24/14  Yes Hillis Range, MD  pantoprazole (PROTONIX) 40 MG tablet Take 1 tablet (40 mg total) by mouth daily. 06/24/14  Yes Hillis Range, MD    Family History  Family History  Problem Relation Age of Onset  . Depression Father   . Heart disease Father        Some sort of heart issue at end of life - possibly CAD  . Multiple sclerosis Mother   . Cancer Mother        Tumor in kidney, spread to bones    Social History  Social History   Social History  . Marital status: Single    Spouse name: N/A  . Number of children: N/A  . Years of education: N/A   Occupational History  . Not on file.   Social History Main Topics  . Smoking status: Never Smoker  . Smokeless tobacco: Never Used  . Alcohol use No  . Drug use: No  . Sexual activity: Not Currently   Other Topics Concern  . Not on file   Social History Narrative  . No narrative on file     Review of Systems, as per HPI, otherwise negative General:  No chills, fever, night sweats or weight changes.  Cardiovascular:  No chest pain, dyspnea on exertion,  edema, orthopnea, palpitations, paroxysmal nocturnal dyspnea. Dermatological: No rash, lesions/masses Respiratory: No cough, dyspnea Urologic: No hematuria, dysuria Abdominal:   No nausea, vomiting, diarrhea, bright [place] blood per rectum, melena, or hematemesis Neurologic:  No visual changes, wkns, changes in mental status. All other systems reviewed and are otherwise negative except as noted above.  Physical Exam  Blood pressure 126/82, pulse 78, height 6\' 3"  (1.905 m), weight 257 lb (116.6 kg).  General: Pleasant, NAD Psych: Normal affect. Neuro: Alert and oriented X 3. Moves all extremities spontaneously. HEENT: Normal  Neck: Supple without bruits or JVD. Lungs:  Resp regular and unlabored, CTA. Heart: RRR no s3, s4, 2/6 systolic murmur. Abdomen: Soft, non-tender, non-distended, BS + x 4.  Extremities: No clubbing,  cyanosis , chronic venous stasis and insufficiency. DP/PT/Radials 2+ and equal bilaterally.  Labs:  No results for input(s): CKTOTAL, CKMB, TROPONINI in the last 72 hours. Lab Results  Component Value Date   WBC 7.0 06/02/2016   HGB 14.7 06/02/2016   HCT 43.0 06/02/2016   MCV 93.3 06/02/2016   PLT 274 06/02/2016    No results found for: DDIMER Invalid input(s): POCBNP    Component Value Date/Time   NA 141 06/02/2016 1205   K 4.5 06/02/2016 1205   CL 106 06/02/2016 1205   CO2 26 06/02/2016 1205   GLUCOSE 98 06/02/2016 1205   BUN 20 06/02/2016 1205   CREATININE 1.12 06/02/2016 1205   CALCIUM 9.0 06/02/2016 1205   PROT 6.7 06/02/2016 1205   ALBUMIN 4.0 06/02/2016 1205   AST 12 06/02/2016 1205   ALT 11 06/02/2016 1205   ALKPHOS 50 06/02/2016 1205   BILITOT 1.3 (H) 06/02/2016 1205   GFRNONAA 85 (L) 06/13/2014 0407   GFRAA >90 06/13/2014 0407   Lab Results  Component Value Date   CHOL 182 06/02/2016   HDL 44 06/02/2016   LDLCALC 115 06/02/2016   TRIG 115 06/02/2016    Accessory Clinical Findings   Echo: 06/03/2014 Study Conclusions  - Left  ventricle: There is profound contractile dyssynchrony, but no clear regional wall motion abnormality. The cavity size was mildly dilated. There was moderate concentric hypertrophy. Systolic function was severely reduced. The estimated ejection fraction was in the range of 15% to 20%. Diffuse hypokinesis. Features are consistent with a pseudonormal left ventricular filling pattern, with concomitant abnormal relaxation and increased filling pressure (grade 2 diastolic dysfunction). No evidence of thrombus. - Ventricular septum: Septal motion showed abnormal function, dyssynergy, and paradox. These changes are consistent with a left bundle branch block. - Aortic valve: There was mild regurgitation. - Mitral valve: There was moderate regurgitation directed centrally. - Left atrium: The atrium was severely dilated. - Right ventricle: The cavity size was moderately dilated. - Right atrium: The atrium was moderately to severely dilated.  TTE: 11/2014 - Left ventricle: The cavity size was normal. There was mild concentric hypertrophy. Systolic function was severely reduced. The estimated ejection fraction was in the range of 25% to 30%. Diffuse hypokinesis. Possible hypokinesis of the mid-apicalanterior and apical myocardium. This may be artifactual and related to LBBB. Doppler parameters are consistent with abnormal left ventricular relaxation (grade 1 diastolic dysfunction). Indeterminate mean left atrial pressure. - Ventricular septum: Septal motion showed abnormal function, dyssynergy, and paradox. - Aortic valve: There was mild to moderate regurgitation directed centrally in the LVOT. - Mitral valve: There was mild regurgitation. - Left atrium: The atrium was moderately dilated. - Right atrium: The atrium was mildly dilated.  Impressions: - Compared to September 2015, LVEF has improved, but remains severely depressed. There is profound  systolic dyssynchrony.  EKG performed today 02/14/2017 shows normal sinus rhythm, left bundle branch block, unchanged from prior. Personally reviewed.  Assessment and Plan:  1. Paroxysmal a-fib Doing well with amiodarone, maintaining SR. The most recent TSH was normal. CHA2DS2-VASc Score 3. He had a terrible experience with GIB after starting Eliquis in the hospital Hb 16--> 8.1 requiring transfusions. He is clear in his decision to decline anticoagulation. Dr Johney Frame offered left atrial appendage occlusion device placement as an alternative.  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 He agrees to that.  2. 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  The most recent LVEF 25-30% - refused BiV ICD. This was the L4 to several times including today but he is not interested. He is also not interesting in having repeat echocardiogram. We'll continue amiodarone 200 mg by mouth daily. We will continue lisinopril and carvedilol as well as spironolactone daily, furosemide just as needed.  3. Chronic systolic CHF, NYHA I - well compensated, improving functional capacity,  He is motivated to exercise on a regular basis, he has lost 7 pounds and would like to lose more.  4. H/O GI Bleed with blood loss anemia  Continue protonix, no further bleeding repeat hemoglobin in May 2016 was 14.8. CBC today. Although Dr. Leone Payor thought he would be ok to resume NOAC, pt is very concerned re another GI bleed.  Risks discussion of stroke vs bleeding as above.  5. Swelling of RLE/ venous insufficiniency Resolved No DVT by  doppler Support hose as needed  6. Hypertensive cardiovascular disease with CHF -well controlled.   Tobias Alexander,  MD 02/17/2017

## 2017-11-04 ENCOUNTER — Other Ambulatory Visit: Payer: Self-pay | Admitting: Cardiology

## 2017-11-04 DIAGNOSIS — I480 Paroxysmal atrial fibrillation: Secondary | ICD-10-CM

## 2017-11-04 DIAGNOSIS — I10 Essential (primary) hypertension: Secondary | ICD-10-CM

## 2017-11-04 MED ORDER — LISINOPRIL 40 MG PO TABS: 40.0000 mg | ORAL_TABLET | Freq: Every day | ORAL | 0 refills | Status: DC

## 2017-11-04 MED ORDER — SPIRONOLACTONE 25 MG PO TABS: 25.0000 mg | ORAL_TABLET | Freq: Every day | ORAL | 0 refills | Status: DC

## 2017-11-04 MED ORDER — CARVEDILOL 12.5 MG PO TABS: 12.5000 mg | ORAL_TABLET | Freq: Two times a day (BID) | ORAL | 0 refills | Status: DC

## 2017-11-04 MED ORDER — AMIODARONE HCL 200 MG PO TABS: 200.0000 mg | ORAL_TABLET | Freq: Every day | ORAL | 0 refills | Status: DC

## 2017-11-04 NOTE — Telephone Encounter (Signed)
Pt's medication was sent to pt's pharmacy as requested. Confirmation received.  °

## 2017-11-30 ENCOUNTER — Encounter: Payer: Self-pay | Admitting: Cardiology

## 2017-12-02 ENCOUNTER — Encounter: Payer: Self-pay | Admitting: Cardiology

## 2017-12-07 ENCOUNTER — Ambulatory Visit: Payer: BLUE CROSS/BLUE SHIELD | Admitting: Cardiology

## 2017-12-15 ENCOUNTER — Ambulatory Visit: Payer: BLUE CROSS/BLUE SHIELD | Admitting: Cardiology

## 2017-12-15 ENCOUNTER — Encounter: Payer: Self-pay | Admitting: Cardiology

## 2017-12-15 VITALS — BP 142/80 | HR 83 | Ht 75.0 in | Wt 250.8 lb

## 2017-12-15 DIAGNOSIS — I10 Essential (primary) hypertension: Secondary | ICD-10-CM | POA: Diagnosis not present

## 2017-12-15 DIAGNOSIS — I110 Hypertensive heart disease with heart failure: Secondary | ICD-10-CM | POA: Diagnosis not present

## 2017-12-15 DIAGNOSIS — I519 Heart disease, unspecified: Secondary | ICD-10-CM

## 2017-12-15 DIAGNOSIS — I480 Paroxysmal atrial fibrillation: Secondary | ICD-10-CM

## 2017-12-15 MED ORDER — CARVEDILOL 25 MG PO TABS: 25.0000 mg | ORAL_TABLET | Freq: Two times a day (BID) | ORAL | 3 refills | Status: DC

## 2017-12-15 MED ORDER — CARVEDILOL 12.5 MG PO TABS: 12.5000 mg | ORAL_TABLET | Freq: Two times a day (BID) | ORAL | 0 refills | Status: DC

## 2017-12-15 NOTE — Progress Notes (Signed)
Patient ID: [name], male   DOB: [birth year], 72 y.o.   MRN: [id number]     Patient Name: Bryan Moran Date of Encounter: 12/15/2017  Primary Care Provider:  Burton Apley, MD Primary Cardiologist:  Aris Lot Reena Borromeo/Dr Allred  Problem List   Past Medical History:  Diagnosis Date  . A-fib (HCC)    a. Officially dx 05/27/14, sx may have been going on longer.  . Acute blood loss anemia   . Acute duodenal ulcer with hemorrhage 06/10/2014  . Atrial fibrillation, persistent (HCC)   . Coagulopathy (HCC)   . Dysrhythmia   . Eczema   . Hypertension   . Venous insufficiency    Past Surgical History:  Procedure Laterality Date  . CARDIOVERSION N/A 06/05/2014   Procedure: CARDIOVERSION;  Surgeon: Quintella Reichert, MD;  Location: [facility] ENDOSCOPY;  Service: Cardiovascular;  Laterality: N/A;  . ESOPHAGOGASTRODUODENOSCOPY N/A 06/07/2014   Procedure: ESOPHAGOGASTRODUODENOSCOPY (EGD);  Surgeon: Theda Belfast, MD;  Location: [facility] ENDOSCOPY;  Service: Endoscopy;  Laterality: N/A;  . ESOPHAGOGASTRODUODENOSCOPY N/A 06/12/2014   Procedure: ESOPHAGOGASTRODUODENOSCOPY (EGD);  Surgeon: Iva Boop, MD;  Location: [facility] ENDOSCOPY;  Service: Endoscopy;  Laterality: N/A;  . HERNIA REPAIR  1993   [facility]  . TEE WITHOUT CARDIOVERSION N/A 06/05/2014   Procedure: TRANSESOPHAGEAL ECHOCARDIOGRAM (TEE);  Surgeon: Quintella Reichert, MD;  Location: [facility] ENDOSCOPY;  Service: Cardiovascular;  Laterality: N/A;   Allergies  No Known Allergies  HPI  Jaelin Fackler is a 72 y.o. male presents today for EP follow-up.  He is maintaining sinus rhythm on amiodarone and feels very well, walking up to 2 miles a day. Marland Kitchen  His R leg edema hs resolved. No further gi issues with bleeding. He saw his GI doctor a few months back and  He thought it would be ok to restart NOAC, however pt  is adamant that he will not take any anticoagulants due to GI bleed in the fall on eliquis. He would consider a baby asa, but has a chads2vasc score of at least  3 and that would not be sufficient to be protective for stroke. He feels like he has been maintaining SR, with h/o TMC, and echo will need to be repeated to see if improvement in SR.   Seen by Dr Johney Frame on 10/24/14 and is coming to establish CHF care. He denies any chest pain, dyspnea, dizziness, presyncope, syncope, or neurologic sequela. The patient is tolerating medications without difficulties and is otherwise without complaint. He has question about discontinuation of Amiodarone. The patient states that shortly after he was diagnosed with CHF he would be short of breath after walking a few steps however he started to exercise and now is able to walk to and half miles a day Nexium approximately 40 minutes.  12/01/2015 - she is coming after 6 months, he feels well and is able to walk and even run 2 miles a day without significant difficulties. He was seen by Dr. Johney Frame in the past but refused BiV- ICD, mostly because of financial concern and significant co-pays in the past. He denies palpitations or syncope. At the last visit he was found hypertensive and his lisinopril was switched to [facility], however he stop using it as he became significantly hypotensive and lightheaded. He states that at home his blood pressure is in 120s but in our office it's always measured high.  His most recent echocardiogram  performed in March 2016 showed LVEF 25-30%.  02/17/2017  - 6 months follow-up the patient  is feeling and looking great, he started to run short distances and would like to increase it. He denies any chest pain shortness of breath no palpitations dizziness or syncope. He is complaining of weak legs sometimes give him part-time with his balance.  He denies any lower extremity edema other than chronic markers wanes, no orthopnea paroxysmal nocturnal dyspnea. He is being compliant with his meds.   12/15/2017 - the patient is coming  After one year, he has been doing great, he continues to exercise and denies  any chest pain shortness of breath, he hasn't had any lower extremity edema since he was started on diuretics in 2015. He denies any palpitations dizziness or syncope, no orthopnea paroxysmal nocturnal dyspnea. He tolerates his medications well but because of side effects of amiodarone he decided to stop taking it approximately 9 months ago. He hasn't felt any episodes of A. Fib.  Home Medications  Prior to Admission medications   Medication Sig Start Date End Date Taking? Authorizing Provider  amiodarone (PACERONE) 200 MG tablet Take 1 tablet (200 mg total) by mouth daily. 07/24/14  Yes Hillis Range, MD  carvedilol (COREG) 6.25 MG tablet Take 1 tablet (6.25 mg total) by mouth 2 (two) times daily with a meal. 06/14/14  Yes Janetta Hora, PA-C  Dermatological Products, Misc. (ELETONE EX) Apply 1 application topically 2 (two) times daily as needed (eczema).   Yes Historical Provider, MD  ferrous sulfate 325 (65 FE) MG tablet Take 1 tablet (325 mg total) by mouth 2 (two) times daily with a meal. 06/19/14  Yes Iva Boop, MD  furosemide (LASIX) 40 MG tablet One tablet by mouth daily and as needed for swelling Patient taking differently: One tablet by mouth daily and you can take one extra tablet by mouth daily as needed for swelling 07/24/14  Yes Hillis Range, MD  lisinopril (PRINIVIL,ZESTRIL) 20 MG tablet Take 1 tablet (20 mg total) by mouth daily. 10/24/14  Yes Hillis Range, MD  pantoprazole (PROTONIX) 40 MG tablet Take 1 tablet (40 mg total) by mouth daily. 06/24/14  Yes Hillis Range, MD    Family History  Family History  Problem Relation Age of Onset  . Depression Father   . Heart disease Father        Some sort of heart issue at end of life - possibly CAD  . Multiple sclerosis Mother   . Cancer Mother        Tumor in kidney, spread to bones    Social History  Social History   Socioeconomic History  . Marital status: Single    Spouse name: Not on file  . Number of children:  Not on file  . Years of education: Not on file  . Highest education level: Not on file  Occupational History  . Not on file  Social Needs  . Financial resource strain: Not on file  . Food insecurity:    Worry: Not on file    Inability: Not on file  . Transportation needs:    Medical: Not on file    Non-medical: Not on file  Tobacco Use  . Smoking status: Never Smoker  . Smokeless tobacco: Never Used  Substance and Sexual Activity  . Alcohol use: No  . Drug use: No  . Sexual activity: Not Currently  Lifestyle  . Physical activity:    Days per week: Not on file    Minutes per session: Not on file  . Stress: Not on file  Relationships  .  Social connections:    Talks on phone: Not on file    Gets together: Not on file    Attends religious service: Not on file    Active member of club or organization: Not on file    Attends meetings of clubs or organizations: Not on file    Relationship status: Not on file  . Intimate partner violence:    Fear of current or ex partner: Not on file    Emotionally abused: Not on file    Physically abused: Not on file    Forced sexual activity: Not on file  Other Topics Concern  . Not on file  Social History Narrative  . Not on file     Review of Systems, as per HPI, otherwise negative General:  No chills, fever, night sweats or weight changes.  Cardiovascular:  No chest pain, dyspnea on exertion, edema, orthopnea, palpitations, paroxysmal nocturnal dyspnea. Dermatological: No rash, lesions/masses Respiratory: No cough, dyspnea Urologic: No hematuria, dysuria Abdominal:   No nausea, vomiting, diarrhea, bright Cashwell blood per rectum, melena, or hematemesis Neurologic:  No visual changes, wkns, changes in mental status. All other systems reviewed and are otherwise negative except as noted above.  Physical Exam  Blood pressure (!) 142/80, pulse 83, height 6\' 3"  (1.905 m), weight 250 lb 12.8 oz (113.8 kg), SpO2 93 %.  General: Pleasant,  NAD Psych: Normal affect. Neuro: Alert and oriented X 3. Moves all extremities spontaneously. HEENT: Normal  Neck: Supple without bruits or JVD. Lungs:  Resp regular and unlabored, CTA. Heart: RRR no s3, s4, 2/6 systolic murmur. Abdomen: Soft, non-tender, non-distended, BS + x 4.  Extremities: No clubbing, cyanosis , chronic venous stasis and insufficiency. DP/PT/Radials 2+ and equal bilaterally.  Labs:  No results for input(s): CKTOTAL, CKMB, TROPONINI in the last 72 hours. Lab Results  Component Value Date   WBC 7.0 06/02/2016   HGB 14.7 06/02/2016   HCT 43.0 06/02/2016   MCV 93.3 06/02/2016   PLT 274 06/02/2016    No results found for: DDIMER Invalid input(s): POCBNP    Component Value Date/Time   NA 141 06/02/2016 1205   K 4.5 06/02/2016 1205   CL 106 06/02/2016 1205   CO2 26 06/02/2016 1205   GLUCOSE 98 06/02/2016 1205   BUN 20 06/02/2016 1205   CREATININE 1.12 06/02/2016 1205   CALCIUM 9.0 06/02/2016 1205   PROT 6.7 06/02/2016 1205   ALBUMIN 4.0 06/02/2016 1205   AST 12 06/02/2016 1205   ALT 11 06/02/2016 1205   ALKPHOS 50 06/02/2016 1205   BILITOT 1.3 (H) 06/02/2016 1205   GFRNONAA 85 (L) 06/13/2014 0407   GFRAA >90 06/13/2014 0407   Lab Results  Component Value Date   CHOL 182 06/02/2016   HDL 44 06/02/2016   LDLCALC 115 06/02/2016   TRIG 115 06/02/2016    Accessory Clinical Findings  Echo: 06/03/2014 Study Conclusions  - Left ventricle: There is profound contractile dyssynchrony, but no clear regional wall motion abnormality. The cavity size was mildly dilated. There was moderate concentric hypertrophy. Systolic function was severely reduced. The estimated ejection fraction was in the range of 15% to 20%. Diffuse hypokinesis. Features are consistent with a pseudonormal left ventricular filling pattern, with concomitant abnormal relaxation and increased filling pressure (grade 2 diastolic dysfunction). No evidence of thrombus. -  Ventricular septum: Septal motion showed abnormal function, dyssynergy, and paradox. These changes are consistent with a left bundle branch block. - Aortic valve: There was mild regurgitation. - Mitral  valve: There was moderate regurgitation directed centrally. - Left atrium: The atrium was severely dilated. - Right ventricle: The cavity size was moderately dilated. - Right atrium: The atrium was moderately to severely dilated.  TTE: 11/2014 - Left ventricle: The cavity size was normal. There was mild concentric hypertrophy. Systolic function was severely reduced. The estimated ejection fraction was in the range of 25% to 30%. Diffuse hypokinesis. Possible hypokinesis of the mid-apicalanterior and apical myocardium. This may be artifactual and related to LBBB. Doppler parameters are consistent with abnormal left ventricular relaxation (grade 1 diastolic dysfunction). Indeterminate mean left atrial pressure. - Ventricular septum: Septal motion showed abnormal function, dyssynergy, and paradox. - Aortic valve: There was mild to moderate regurgitation directed centrally in the LVOT. - Mitral valve: There was mild regurgitation. - Left atrium: The atrium was moderately dilated. - Right atrium: The atrium was mildly dilated.  Impressions: - Compared to September 2015, LVEF has improved, but remains severely depressed. There is profound systolic dyssynchrony.  EKG performed today 12/15/2017 shows sinus rhythm with PACs left bundle branch block unchanged from prior. Assessment and Plan:  1. Paroxysmal a-fib Doing well off amiodarone, maintaining SR. He had side effects in regards to worsening hyperthyroidism. CHA2DS2-VASc Score 3. He had a terrible experience with GIB after starting Eliquis in the hospital Hb 16--> 8.1 requiring transfusions. He is clear in his decision to decline anticoagulation. Dr Johney Frame offered left atrial appendage occlusion device placement  as an alternative.  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 He agreed to that however later discontinued anyway..  2. 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  The most recent LVEF 25-30% - refused BiV ICD. I would continue lisinopril and increase carvedilol to 25 mg by mouth twice a day as his blood pressures elevated heart rate is in 80s.Ideally he should be on Entresto however cost was always concern; continue lisinopril for now.  3. H/O GI Bleed with blood loss anemia  Continue protonix, no further bleeding repeat hemoglobin in May 2016 was 14.8. CBC today. Although Dr. Leone Payor thought he would be ok to resume NOAC, pt is very concerned re another GI bleed.  Risks discussion of stroke vs bleeding as above.  4. Swelling of RLE/ venous insufficiniency Resolved No DVT by  doppler Support hose as needed We will obtain his labs from his primary care physician.  5. Hypertensive cardiovascular disease with CHF -increase carvedilol 25 mg by mouth twice a day.  Follow-up in 6 months.  Tobias Alexander, MD 12/15/2017

## 2017-12-15 NOTE — Patient Instructions (Addendum)
Medication Instructions:   INCREASE YOUR CARVEDILOL TO 25 MG TWICE DAILY WITH MEALS    Follow-Up:  Your physician wants you to follow-up in: 6 MONTHS WITH DR Johnell Comings will receive a reminder letter in the mail two months in advance. If you don't receive a letter, please call our office to schedule the follow-up appointment.        If you need a refill on your cardiac medications before your next appointment, please call your pharmacy.

## 2018-07-27 ENCOUNTER — Ambulatory Visit: Payer: BLUE CROSS/BLUE SHIELD | Admitting: Cardiology

## 2018-07-27 VITALS — BP 126/80 | HR 85 | Ht 75.0 in | Wt 247.0 lb

## 2018-07-27 DIAGNOSIS — I4819 Other persistent atrial fibrillation: Secondary | ICD-10-CM | POA: Diagnosis not present

## 2018-07-27 DIAGNOSIS — I480 Paroxysmal atrial fibrillation: Secondary | ICD-10-CM

## 2018-07-27 DIAGNOSIS — I110 Hypertensive heart disease with heart failure: Secondary | ICD-10-CM | POA: Diagnosis not present

## 2018-07-27 DIAGNOSIS — I447 Left bundle-branch block, unspecified: Secondary | ICD-10-CM

## 2018-07-27 DIAGNOSIS — I519 Heart disease, unspecified: Secondary | ICD-10-CM

## 2018-07-27 NOTE — Patient Instructions (Signed)
Medication Instructions:  Your physician recommends that you continue on your current medications as directed. Please refer to the Current Medication list given to you today.  If you need a refill on your cardiac medications before your next appointment, please call your pharmacy.   Lab work: None If you have labs (blood work) drawn today and your tests are completely normal, you will receive your results only by: Marland Kitchen MyChart Message (if you have MyChart) OR . A paper copy in the mail If you have any lab test that is abnormal or we need to change your treatment, we will call you to review the results.  Testing/Procedures: None  Follow-Up: At [facility], you and your health needs are our priority.  As part of our continuing mission to provide you with exceptional heart care, we have created designated Provider Care Teams.  These Care Teams include your primary Cardiologist (physician) and Advanced Practice Providers (APPs -  Physician Assistants and Nurse Practitioners) who all work together to provide you with the care you need, when you need it. You will need a follow up appointment in 6 months.  Please call our office 2 months in advance to schedule this appointment.  You may see Tobias Alexander, MD or one of the following Advanced Practice Providers on your designated Care Team:   Norma Fredrickson, NP Nada Boozer, NP . Georgie Chard, NP  Any Other Special Instructions Will Be Listed Below (If Applicable).

## 2018-07-27 NOTE — Progress Notes (Signed)
Patient ID: [name], male   DOB: August 24, 1946, 72 y.o.   MRN: [id number]     Patient Name: Zubair Lofton Date of Encounter: 07/27/2018  Primary Care Provider:  Burton Apley, MD Primary Cardiologist:  Aris Lot Sefora Tietje/Dr Allred  Reason for visit: 6 months follow-up  Problem List   Past Medical History:  Diagnosis Date  . A-fib (HCC)    a. Officially dx 05/27/14, sx may have been going on longer.  . Acute blood loss anemia   . Acute duodenal ulcer with hemorrhage 06/10/2014  . Atrial fibrillation, persistent (HCC)   . Coagulopathy (HCC)   . Dysrhythmia   . Eczema   . Hypertension   . Venous insufficiency    Past Surgical History:  Procedure Laterality Date  . CARDIOVERSION N/A 06/05/2014   Procedure: CARDIOVERSION;  Surgeon: Quintella Reichert, MD;  Location: [facility] ENDOSCOPY;  Service: Cardiovascular;  Laterality: N/A;  . ESOPHAGOGASTRODUODENOSCOPY N/A 06/07/2014   Procedure: ESOPHAGOGASTRODUODENOSCOPY (EGD);  Surgeon: Theda Belfast, MD;  Location: [facility] ENDOSCOPY;  Service: Endoscopy;  Laterality: N/A;  . ESOPHAGOGASTRODUODENOSCOPY N/A 06/12/2014   Procedure: ESOPHAGOGASTRODUODENOSCOPY (EGD);  Surgeon: Iva Boop, MD;  Location: [facility] ENDOSCOPY;  Service: Endoscopy;  Laterality: N/A;  . HERNIA REPAIR  1993   [facility]  . TEE WITHOUT CARDIOVERSION N/A 06/05/2014   Procedure: TRANSESOPHAGEAL ECHOCARDIOGRAM (TEE);  Surgeon: Quintella Reichert, MD;  Location: [facility] ENDOSCOPY;  Service: Cardiovascular;  Laterality: N/A;   Allergies  No Known Allergies  HPI  Rajon Bisig is a 72 y.o. male with prior medical history of chronic systolic CHF, last LVEF 25 to 40% on the echo in 2016,LBBB, refused BiV ICD, NYHA IIa, hypertensive heart disease with CHF, paroxysmal atrial fibrillation controlled by amiodarone and no anticoagulation secondary to GI bleed.   07/27/2018 -this is 6 months follow-up, patient is doing great, he does not feel limited in his activities, he can exercise walk to all activities  of daily living, and denies any palpitations no dizziness, he has no lower extremity edema, orthopnea, proximal nocturnal dyspnea.  He is not using amiodarone anymore.  Home Medications  Prior to Admission medications   Medication Sig Start Date End Date Taking? Authorizing Provider  amiodarone (PACERONE) 200 MG tablet Take 1 tablet (200 mg total) by mouth daily. 07/24/14  Yes Hillis Range, MD  carvedilol (COREG) 6.25 MG tablet Take 1 tablet (6.25 mg total) by mouth 2 (two) times daily with a meal. 06/14/14  Yes Janetta Hora, PA-C  Dermatological Products, Misc. (ELETONE EX) Apply 1 application topically 2 (two) times daily as needed (eczema).   Yes Historical Provider, MD  ferrous sulfate 325 (65 FE) MG tablet Take 1 tablet (325 mg total) by mouth 2 (two) times daily with a meal. 06/19/14  Yes Iva Boop, MD  furosemide (LASIX) 40 MG tablet One tablet by mouth daily and as needed for swelling Patient taking differently: One tablet by mouth daily and you can take one extra tablet by mouth daily as needed for swelling 07/24/14  Yes Hillis Range, MD  lisinopril (PRINIVIL,ZESTRIL) 20 MG tablet Take 1 tablet (20 mg total) by mouth daily. 10/24/14  Yes Hillis Range, MD  pantoprazole (PROTONIX) 40 MG tablet Take 1 tablet (40 mg total) by mouth daily. 06/24/14  Yes Hillis Range, MD    Family History  Family History  Problem Relation Age of Onset  . Depression Father   . Heart disease Father        Some sort of  heart issue at end of life - possibly CAD  . Multiple sclerosis Mother   . Cancer Mother        Tumor in kidney, spread to bones   Social History  Social History   Socioeconomic History  . Marital status: Single    Spouse name: Not on file  . Number of children: Not on file  . Years of education: Not on file  . Highest education level: Not on file  Occupational History  . Not on file  Social Needs  . Financial resource strain: Not on file  . Food insecurity:    Worry:  Not on file    Inability: Not on file  . Transportation needs:    Medical: Not on file    Non-medical: Not on file  Tobacco Use  . Smoking status: Never Smoker  . Smokeless tobacco: Never Used  Substance and Sexual Activity  . Alcohol use: No  . Drug use: No  . Sexual activity: Not Currently  Lifestyle  . Physical activity:    Days per week: Not on file    Minutes per session: Not on file  . Stress: Not on file  Relationships  . Social connections:    Talks on phone: Not on file    Gets together: Not on file    Attends religious service: Not on file    Active member of club or organization: Not on file    Attends meetings of clubs or organizations: Not on file    Relationship status: Not on file  . Intimate partner violence:    Fear of current or ex partner: Not on file    Emotionally abused: Not on file    Physically abused: Not on file    Forced sexual activity: Not on file  Other Topics Concern  . Not on file  Social History Narrative  . Not on file     Review of Systems, as per HPI, otherwise negative General:  No chills, fever, night sweats or weight changes.  Cardiovascular:  No chest pain, dyspnea on exertion, edema, orthopnea, palpitations, paroxysmal nocturnal dyspnea. Dermatological: No rash, lesions/masses Respiratory: No cough, dyspnea Urologic: No hematuria, dysuria Abdominal:   No nausea, vomiting, diarrhea, bright Mazzeo blood per rectum, melena, or hematemesis Neurologic:  No visual changes, wkns, changes in mental status. All other systems reviewed and are otherwise negative except as noted above.  Physical Exam  Blood pressure 126/80, pulse 85, height 6\' 3"  (1.905 m), weight 247 lb (112 kg).  General: Pleasant, NAD Psych: Normal affect. Neuro: Alert and oriented X 3. Moves all extremities spontaneously. HEENT: Normal  Neck: Supple without bruits or JVD. Lungs:  Resp regular and unlabored, CTA. Heart: RRR no s3, s4, 2/6 systolic murmur. Abdomen:  Soft, non-tender, non-distended, BS + x 4.  Extremities: No clubbing, cyanosis , chronic venous stasis and insufficiency. DP/PT/Radials 2+ and equal bilaterally.  Labs:  No results for input(s): CKTOTAL, CKMB, TROPONINI in the last 72 hours. Lab Results  Component Value Date   WBC 7.0 06/02/2016   HGB 14.7 06/02/2016   HCT 43.0 06/02/2016   MCV 93.3 06/02/2016   PLT 274 06/02/2016    No results found for: DDIMER Invalid input(s): POCBNP    Component Value Date/Time   NA 141 06/02/2016 1205   K 4.5 06/02/2016 1205   CL 106 06/02/2016 1205   CO2 26 06/02/2016 1205   GLUCOSE 98 06/02/2016 1205   BUN 20 06/02/2016 1205   CREATININE 1.12  06/02/2016 1205   CALCIUM 9.0 06/02/2016 1205   PROT 6.7 06/02/2016 1205   ALBUMIN 4.0 06/02/2016 1205   AST 12 06/02/2016 1205   ALT 11 06/02/2016 1205   ALKPHOS 50 06/02/2016 1205   BILITOT 1.3 (H) 06/02/2016 1205   GFRNONAA 85 (L) 06/13/2014 0407   GFRAA >90 06/13/2014 0407   Lab Results  Component Value Date   CHOL 182 06/02/2016   HDL 44 06/02/2016   LDLCALC 115 06/02/2016   TRIG 115 06/02/2016   Accessory Clinical Findings  Echo: 06/03/2014 Study Conclusions  - Left ventricle: There is profound contractile dyssynchrony, but no clear regional wall motion abnormality. The cavity size was mildly dilated. There was moderate concentric hypertrophy. Systolic function was severely reduced. The estimated ejection fraction was in the range of 15% to 20%. Diffuse hypokinesis. Features are consistent with a pseudonormal left ventricular filling pattern, with concomitant abnormal relaxation and increased filling pressure (grade 2 diastolic dysfunction). No evidence of thrombus. - Ventricular septum: Septal motion showed abnormal function, dyssynergy, and paradox. These changes are consistent with a left bundle branch block. - Aortic valve: There was mild regurgitation. - Mitral valve: There was moderate regurgitation  directed centrally. - Left atrium: The atrium was severely dilated. - Right ventricle: The cavity size was moderately dilated. - Right atrium: The atrium was moderately to severely dilated.  TTE: 11/2014 - Left ventricle: The cavity size was normal. There was mild concentric hypertrophy. Systolic function was severely reduced. The estimated ejection fraction was in the range of 25% to 30%. Diffuse hypokinesis. Possible hypokinesis of the mid-apicalanterior and apical myocardium. This may be artifactual and related to LBBB. Doppler parameters are consistent with abnormal left ventricular relaxation (grade 1 diastolic dysfunction). Indeterminate mean left atrial pressure. - Ventricular septum: Septal motion showed abnormal function, dyssynergy, and paradox. - Aortic valve: There was mild to moderate regurgitation directed centrally in the LVOT. - Mitral valve: There was mild regurgitation. - Left atrium: The atrium was moderately dilated. - Right atrium: The atrium was mildly dilated.  Impressions: - Compared to September 2015, LVEF has improved, but remains severely depressed. There is profound systolic dyssynchrony.  EKG performed today 07/27/2018 shows sinus rhythm with left bundle branch block, unchanged from prior, this was personally reviewed.   Assessment and Plan:  1. Paroxysmal a-fib Doing well off amiodarone, maintaining SR. CHA2DS2-VASc Score 3. He had a terrible experience with GIB after starting Eliquis in the hospital Hb 16--> 8.1 requiring transfusions. He is clear in his decision to decline anticoagulation.   2. 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  The most recent LVEF 25-30% - refused BiV ICD.  Sherryll Burger was too expensive, will continue lisinopril and carvedilol, he is functional class NYHA IIa.  3. H/O GI Bleed with blood loss anemia  Although  Dr. Leone Payor thought he would be ok to resume NOAC, pt is very concerned re another GI bleed.  Risks discussion of stroke vs bleeding as above.  4. Hypertensive cardiovascular disease with CHF -well-controlled.  Follow-up in 6 months.  Tobias Alexander, MD 07/27/2018

## 2020-01-03 ENCOUNTER — Ambulatory Visit: Payer: BC Managed Care – PPO | Attending: Internal Medicine

## 2020-01-03 DIAGNOSIS — Z23 Encounter for immunization: Secondary | ICD-10-CM

## 2020-01-03 NOTE — Progress Notes (Signed)
   Covid-19 Vaccination Clinic  Name:  Bryan Moran    MRN: [id number] DOB: 1945/12/26  01/03/2020  [name] was observed post Covid-19 immunization for 15 minutes without incident. He was provided with Vaccine Information Sheet and instruction to access the V-Safe system.   [name] was instructed to call 911 with any severe reactions post vaccine: Marland Kitchen Difficulty breathing  . Swelling of face and throat  . A fast heartbeat  . A bad rash all over body  . Dizziness and weakness   Immunizations Administered    Name Date Dose VIS Date Route   Pfizer COVID-19 Vaccine 01/03/2020  9:47 AM 0.3 mL 09/07/2019 Intramuscular   Manufacturer: [employer], [employer]   Lot: [id number]   NDC: [address]-6

## 2020-01-29 ENCOUNTER — Ambulatory Visit: Payer: BC Managed Care – PPO | Attending: Internal Medicine

## 2020-01-29 DIAGNOSIS — Z23 Encounter for immunization: Secondary | ICD-10-CM

## 2020-01-29 NOTE — Progress Notes (Signed)
   Covid-19 Vaccination Clinic  Name:  Bryan Moran    MRN: [id number] DOB: 1946-08-24  01/29/2020  [name] was observed post Covid-19 immunization for 15 minutes without incident. He was provided with Vaccine Information Sheet and instruction to access the V-Safe system.   [name] was instructed to call 911 with any severe reactions post vaccine: Marland Kitchen Difficulty breathing  . Swelling of face and throat  . A fast heartbeat  . A bad rash all over body  . Dizziness and weakness   Immunizations Administered    Name Date Dose VIS Date Route   Pfizer COVID-19 Vaccine 01/29/2020  9:43 AM 0.3 mL 11/21/2018 Intramuscular   Manufacturer: [employer], [employer]   Lot: [id number]   NDC: [address]-3

## 2020-02-05 ENCOUNTER — Emergency Department (HOSPITAL_COMMUNITY): Payer: BC Managed Care – PPO

## 2020-02-05 ENCOUNTER — Inpatient Hospital Stay (HOSPITAL_COMMUNITY)
Admission: EM | Admit: 2020-02-05 | Discharge: 2020-02-13 | DRG: 308 | Disposition: A | Discharge status: Home Health | Payer: BC Managed Care – PPO | Attending: Family Medicine | Admitting: Family Medicine

## 2020-02-05 ENCOUNTER — Other Ambulatory Visit: Payer: Self-pay

## 2020-02-05 ENCOUNTER — Encounter (HOSPITAL_COMMUNITY): Payer: Self-pay

## 2020-02-05 DIAGNOSIS — I519 Heart disease, unspecified: Secondary | ICD-10-CM | POA: Diagnosis present

## 2020-02-05 DIAGNOSIS — Z79890 Hormone replacement therapy (postmenopausal): Secondary | ICD-10-CM

## 2020-02-05 DIAGNOSIS — N179 Acute kidney failure, unspecified: Secondary | ICD-10-CM | POA: Diagnosis not present

## 2020-02-05 DIAGNOSIS — Z79899 Other long term (current) drug therapy: Secondary | ICD-10-CM

## 2020-02-05 DIAGNOSIS — I130 Hypertensive heart and chronic kidney disease with heart failure and stage 1 through stage 4 chronic kidney disease, or unspecified chronic kidney disease: Secondary | ICD-10-CM | POA: Diagnosis present

## 2020-02-05 DIAGNOSIS — R197 Diarrhea, unspecified: Secondary | ICD-10-CM | POA: Diagnosis present

## 2020-02-05 DIAGNOSIS — Z9119 Patient's noncompliance with other medical treatment and regimen: Secondary | ICD-10-CM

## 2020-02-05 DIAGNOSIS — I509 Heart failure, unspecified: Secondary | ICD-10-CM

## 2020-02-05 DIAGNOSIS — Z20822 Contact with and (suspected) exposure to covid-19: Secondary | ICD-10-CM | POA: Diagnosis present

## 2020-02-05 DIAGNOSIS — I4891 Unspecified atrial fibrillation: Secondary | ICD-10-CM | POA: Diagnosis present

## 2020-02-05 DIAGNOSIS — Z809 Family history of malignant neoplasm, unspecified: Secondary | ICD-10-CM

## 2020-02-05 DIAGNOSIS — J9621 Acute and chronic respiratory failure with hypoxia: Secondary | ICD-10-CM | POA: Diagnosis present

## 2020-02-05 DIAGNOSIS — I10 Essential (primary) hypertension: Secondary | ICD-10-CM | POA: Diagnosis present

## 2020-02-05 DIAGNOSIS — N1831 Chronic kidney disease, stage 3a: Secondary | ICD-10-CM | POA: Diagnosis present

## 2020-02-05 DIAGNOSIS — E876 Hypokalemia: Secondary | ICD-10-CM | POA: Diagnosis present

## 2020-02-05 DIAGNOSIS — J9601 Acute respiratory failure with hypoxia: Secondary | ICD-10-CM | POA: Diagnosis not present

## 2020-02-05 DIAGNOSIS — I959 Hypotension, unspecified: Secondary | ICD-10-CM | POA: Diagnosis not present

## 2020-02-05 DIAGNOSIS — Z9114 Patient's other noncompliance with medication regimen: Secondary | ICD-10-CM

## 2020-02-05 DIAGNOSIS — E860 Dehydration: Secondary | ICD-10-CM | POA: Diagnosis present

## 2020-02-05 DIAGNOSIS — I4821 Permanent atrial fibrillation: Principal | ICD-10-CM | POA: Diagnosis present

## 2020-02-05 DIAGNOSIS — I5043 Acute on chronic combined systolic (congestive) and diastolic (congestive) heart failure: Secondary | ICD-10-CM | POA: Diagnosis present

## 2020-02-05 DIAGNOSIS — I447 Left bundle-branch block, unspecified: Secondary | ICD-10-CM | POA: Diagnosis present

## 2020-02-05 DIAGNOSIS — Z818 Family history of other mental and behavioral disorders: Secondary | ICD-10-CM

## 2020-02-05 DIAGNOSIS — Z820 Family history of epilepsy and other diseases of the nervous system: Secondary | ICD-10-CM

## 2020-02-05 DIAGNOSIS — Z8249 Family history of ischemic heart disease and other diseases of the circulatory system: Secondary | ICD-10-CM

## 2020-02-05 DIAGNOSIS — K922 Gastrointestinal hemorrhage, unspecified: Secondary | ICD-10-CM | POA: Diagnosis present

## 2020-02-05 LAB — CBC
HCT: 50.3 % (ref 39.0–52.0)
Hemoglobin: 15.8 g/dL (ref 13.0–17.0)
MCH: 31.4 pg (ref 26.0–34.0)
MCHC: 31.4 g/dL (ref 30.0–36.0)
MCV: 100 fL (ref 80.0–100.0)
Platelets: 161 10*3/uL (ref 150–400)
RBC: 5.03 MIL/uL (ref 4.22–5.81)
RDW: 15.6 % — ABNORMAL HIGH (ref 11.5–15.5)
WBC: 7 10*3/uL (ref 4.0–10.5)
nRBC: 0 % (ref 0.0–0.2)

## 2020-02-05 LAB — BRAIN NATRIURETIC PEPTIDE: B Natriuretic Peptide: 789.9 pg/mL — ABNORMAL HIGH (ref 0.0–100.0)

## 2020-02-05 LAB — SARS CORONAVIRUS 2 BY RT PCR (HOSPITAL ORDER, PERFORMED IN ~~LOC~~ HOSPITAL LAB): SARS Coronavirus 2: NEGATIVE

## 2020-02-05 LAB — TROPONIN I (HIGH SENSITIVITY)
Troponin I (High Sensitivity): 75 ng/L — ABNORMAL HIGH (ref <18)
Troponin I (High Sensitivity): 81 ng/L — ABNORMAL HIGH (ref <18)
Troponin I (High Sensitivity): 83 ng/L — ABNORMAL HIGH (ref <18)

## 2020-02-05 LAB — COMPREHENSIVE METABOLIC PANEL
ALT: 41 U/L (ref 0–44)
AST: 28 U/L (ref 15–41)
Albumin: 2.8 g/dL — ABNORMAL LOW (ref 3.5–5.0)
Alkaline Phosphatase: 52 U/L (ref 38–126)
Anion gap: 11 (ref 5–15)
BUN: 32 mg/dL — ABNORMAL HIGH (ref 8–23)
CO2: 24 mmol/L (ref 22–32)
Calcium: 8.6 mg/dL — ABNORMAL LOW (ref 8.9–10.3)
Chloride: 108 mmol/L (ref 98–111)
Creatinine, Ser: 1.73 mg/dL — ABNORMAL HIGH (ref 0.61–1.24)
GFR calc Af Amer: 45 mL/min — ABNORMAL LOW (ref >60)
GFR calc non Af Amer: 39 mL/min — ABNORMAL LOW (ref >60)
Glucose, Bld: 86 mg/dL (ref 70–99)
Potassium: 4.3 mmol/L (ref 3.5–5.1)
Sodium: 143 mmol/L (ref 135–145)
Total Bilirubin: 2 mg/dL — ABNORMAL HIGH (ref 0.3–1.2)
Total Protein: 5.1 g/dL — ABNORMAL LOW (ref 6.5–8.1)

## 2020-02-05 LAB — MAGNESIUM: Magnesium: 2.5 mg/dL — ABNORMAL HIGH (ref 1.7–2.4)

## 2020-02-05 LAB — LACTIC ACID, PLASMA
Lactic Acid, Venous: 2.4 mmol/L (ref 0.5–1.9)
Lactic Acid, Venous: 2.1 mmol/L (ref 0.5–1.9)

## 2020-02-05 LAB — LIPASE, BLOOD: Lipase: 28 U/L (ref 11–51)

## 2020-02-05 MED ORDER — ONDANSETRON HCL 4 MG/2ML IJ SOLN: 4.0000 mg | Freq: Four times a day (QID) | INTRAMUSCULAR | Status: DC | PRN

## 2020-02-05 MED ORDER — SODIUM CHLORIDE 0.9% FLUSH
3.0000 mL | Freq: Once | INTRAVENOUS | Status: AC
  Administered 2020-02-05: 19:00:00 3 mL via INTRAVENOUS

## 2020-02-05 MED ORDER — DILTIAZEM HCL-DEXTROSE 125-5 MG/125ML-% IV SOLN (PREMIX)
5.0000 mg/h | INTRAVENOUS | Status: DC
  Administered 2020-02-05: 5 mg/h via INTRAVENOUS
  Filled 2020-02-05 (×4): qty 125

## 2020-02-05 MED ORDER — SODIUM CHLORIDE 0.9 % IV BOLUS
500.0000 mL | Freq: Once | INTRAVENOUS | Status: AC
  Administered 2020-02-05: 21:00:00 500 mL via INTRAVENOUS

## 2020-02-05 MED ORDER — ENOXAPARIN SODIUM 40 MG/0.4ML ~~LOC~~ SOLN: 40.0000 mg | Freq: Every day | SUBCUTANEOUS | Status: DC

## 2020-02-05 MED ORDER — SODIUM CHLORIDE 0.9 % IV BOLUS
500.0000 mL | Freq: Once | INTRAVENOUS | Status: AC
  Administered 2020-02-05: 19:00:00 500 mL via INTRAVENOUS

## 2020-02-05 MED ORDER — ONDANSETRON HCL 4 MG PO TABS: 4.0000 mg | ORAL_TABLET | Freq: Four times a day (QID) | ORAL | Status: DC | PRN

## 2020-02-05 NOTE — ED Provider Notes (Signed)
[facility] EMERGENCY DEPARTMENT Provider Note   CSN: [id number] Arrival date & time: 02/05/20  1727     History Chief Complaint  Patient presents with  . Abdominal Pain    Bryan Moran is a 74 y.o. male.  The history is provided by the patient and medical records. No language interpreter was used.  Illness Location:  Fatigue, nausea, diarrhea, Severity:  Severe Onset quality:  Gradual Duration:  3 days Timing:  Constant Progression:  Unchanged Associated symptoms: cough, diarrhea, fatigue, nausea and shortness of breath   Associated symptoms: no abdominal pain, no chest pain, no congestion, no fever, no loss of consciousness, no vomiting and no wheezing        Past Medical History:  Diagnosis Date  . A-fib (HCC)    a. Officially dx 05/27/14, sx may have been going on longer.  . Acute blood loss anemia   . Acute duodenal ulcer with hemorrhage 06/10/2014  . Atrial fibrillation, persistent (HCC)   . Coagulopathy (HCC)   . Dysrhythmia   . Eczema   . Hypertension   . Venous insufficiency     Patient Active Problem List   Diagnosis Date Noted  . HTN (hypertension) 05/14/2015  . Chronic systolic CHF (congestive heart failure), NYHA class 2 (HCC) 10/29/2014  . Hypertensive cardiovascular disease 10/24/2014  . Chronic systolic dysfunction of left ventricle 07/25/2014  . Venous insufficiency 07/25/2014  . Acute duodenal ulcer with hemorrhage 06/10/2014  . Acute blood loss anemia 06/10/2014  . Hematemesis 06/07/2014  . Coagulopathy (HCC) 06/07/2014  . Atrial fibrillation (HCC) 06/05/2014  . Persistent atrial fibrillation (HCC) 06/02/2014    Past Surgical History:  Procedure Laterality Date  . CARDIOVERSION N/A 06/05/2014   Procedure: CARDIOVERSION;  Surgeon: Quintella Reichert, MD;  Location: [facility] ENDOSCOPY;  Service: Cardiovascular;  Laterality: N/A;  . ESOPHAGOGASTRODUODENOSCOPY N/A 06/07/2014   Procedure: ESOPHAGOGASTRODUODENOSCOPY (EGD);  Surgeon: Theda Belfast, MD;  Location: [facility] ENDOSCOPY;  Service: Endoscopy;  Laterality: N/A;  . ESOPHAGOGASTRODUODENOSCOPY N/A 06/12/2014   Procedure: ESOPHAGOGASTRODUODENOSCOPY (EGD);  Surgeon: Iva Boop, MD;  Location: [facility] ENDOSCOPY;  Service: Endoscopy;  Laterality: N/A;  . HERNIA REPAIR  1993   [facility]  . TEE WITHOUT CARDIOVERSION N/A 06/05/2014   Procedure: TRANSESOPHAGEAL ECHOCARDIOGRAM (TEE);  Surgeon: Quintella Reichert, MD;  Location: [facility] ENDOSCOPY;  Service: Cardiovascular;  Laterality: N/A;       Family History  Problem Relation Age of Onset  . Depression Father   . Heart disease Father        Some sort of heart issue at end of life - possibly CAD  . Multiple sclerosis Mother   . Cancer Mother        Tumor in kidney, spread to bones    Social History   Tobacco Use  . Smoking status: Never Smoker  . Smokeless tobacco: Never Used  Substance Use Topics  . Alcohol use: No  . Drug use: No    Home Medications Prior to Admission medications   Medication Sig Start Date End Date Taking? Authorizing Provider  carvedilol (COREG) 25 MG tablet Take 1 tablet (25 mg total) by mouth 2 (two) times daily with a meal. 12/15/17 07/27/18  Lars Masson, MD  Dermatological Products, Misc. Dahlia Byes EX) Apply 1 application topically 2 (two) times daily as needed (eczema).    [provider]  furosemide (LASIX) 40 MG tablet Take 1 tablet (40 mg total) by mouth daily as needed (swelling). 02/17/17  Dorothy Spark, MD  levothyroxine (SYNTHROID, LEVOTHROID) 50 MCG tablet Take 50 mcg by mouth daily before breakfast.    [provider]  lisinopril (PRINIVIL,ZESTRIL) 40 MG tablet Take 1 tablet (40 mg total) by mouth daily. Please keep upcoming appt for future refills. Thank you Patient taking differently: Take 40 mg by mouth daily.  11/04/17   Dorothy Spark, MD  pantoprazole (PROTONIX) 40 MG tablet Take 1 tablet (40 mg total) by mouth daily. 06/24/14   Allred, Jeneen Rinks, MD   spironolactone (ALDACTONE) 25 MG tablet Take 1 tablet (25 mg total) by mouth daily. Please keep upcoming appt for future refills. Thank you Patient taking differently: Take 25 mg by mouth daily.  11/04/17   Dorothy Spark, MD    Allergies    Patient has no known allergies.  Review of Systems   Review of Systems  Constitutional: Positive for fatigue. Negative for diaphoresis and fever.  HENT: Negative for congestion.   Respiratory: Positive for cough and shortness of breath. Negative for chest tightness and wheezing.   Cardiovascular: Negative for chest pain, palpitations and leg swelling.  Gastrointestinal: Positive for diarrhea and nausea. Negative for abdominal pain, constipation and vomiting.  Genitourinary: Positive for decreased urine volume. Negative for dysuria, flank pain and frequency.  Musculoskeletal: Negative for back pain, neck pain and neck stiffness.  Neurological: Positive for light-headedness. Negative for dizziness, loss of consciousness and weakness.  Psychiatric/Behavioral: Negative for agitation and confusion.  All other systems reviewed and are negative.   Physical Exam Updated Vital Signs There were no vitals taken for this visit.  Physical Exam Vitals and nursing note reviewed.  Constitutional:      General: He is not in acute distress.    Appearance: He is not ill-appearing, toxic-appearing or diaphoretic.  Eyes:     Extraocular Movements: Extraocular movements intact.     Pupils: Pupils are equal, round, and reactive to light.  Cardiovascular:     Rate and Rhythm: Tachycardia present. Rhythm irregular.     Heart sounds: Murmur present.  Pulmonary:     Effort: No respiratory distress.     Breath sounds: No wheezing or rhonchi.  Chest:     Chest wall: No tenderness.  Abdominal:     General: Abdomen is flat. Bowel sounds are normal.     Tenderness: There is no abdominal tenderness. There is no right CVA tenderness or left CVA tenderness.  Skin:     Capillary Refill: Capillary refill takes less than 2 seconds.  Neurological:     General: No focal deficit present.     Mental Status: He is alert.  Psychiatric:        Mood and Affect: Mood normal.     ED Results / Procedures / Treatments   Labs (all labs ordered are listed, but only abnormal results are displayed) Labs Reviewed  COMPREHENSIVE METABOLIC PANEL - Abnormal; Notable for the following components:      Result Value   BUN 32 (*)    Creatinine, Ser 1.73 (*)    Calcium 8.6 (*)    Total Protein 5.1 (*)    Albumin 2.8 (*)    Total Bilirubin 2.0 (*)    GFR calc non Af Amer 39 (*)    GFR calc Af Amer 45 (*)    All other components within normal limits  CBC - Abnormal; Notable for the following components:   RDW 15.6 (*)    All other components within normal limits  LACTIC  ACID, PLASMA - Abnormal; Notable for the following components:   Lactic Acid, Venous 2.4 (*)    All other components within normal limits  LACTIC ACID, PLASMA - Abnormal; Notable for the following components:   Lactic Acid, Venous 2.1 (*)    All other components within normal limits  BRAIN NATRIURETIC PEPTIDE - Abnormal; Notable for the following components:   B Natriuretic Peptide 789.9 (*)    All other components within normal limits  MAGNESIUM - Abnormal; Notable for the following components:   Magnesium 2.5 (*)    All other components within normal limits  TROPONIN I (HIGH SENSITIVITY) - Abnormal; Notable for the following components:   Troponin I (High Sensitivity) 75 (*)    All other components within normal limits  TROPONIN I (HIGH SENSITIVITY) - Abnormal; Notable for the following components:   Troponin I (High Sensitivity) 81 (*)    All other components within normal limits  SARS CORONAVIRUS 2 BY RT PCR (HOSPITAL ORDER, PERFORMED IN [facility])  URINE CULTURE  C DIFFICILE QUICK SCREEN W PCR REFLEX  CULTURE, BLOOD (ROUTINE X 2)  CULTURE, BLOOD (ROUTINE X 2)  LIPASE,  BLOOD  URINALYSIS, ROUTINE W REFLEX MICROSCOPIC  TSH  PROCALCITONIN  TROPONIN I (HIGH SENSITIVITY)    EKG EKG Interpretation  Date/Time:  Tuesday Feb 05 2020 17:39:42 EDT Ventricular Rate:  126 PR Interval:    QRS Duration: 151 QT Interval:  381 QTC Calculation: 552 R Axis:   29 Text Interpretation: Atrial fibrillation Left bundle branch block When compared to prior, now afib with RVR. similar LBBB. No STEMI Confirmed by Theda Belfast ([id number]) on 02/05/2020 5:42:51 PM   Radiology CT Chest Wo Contrast  Result Date: 02/05/2020 CLINICAL DATA:  Shortness of breath. EXAM: CT CHEST WITHOUT CONTRAST TECHNIQUE: Multidetector CT imaging of the chest was performed following the standard protocol without IV contrast. COMPARISON:  Chest plain film, dated Feb 05, 2020. FINDINGS: Cardiovascular: There is mild calcification of the aortic arch. There is mild to moderate severity cardiomegaly. No pericardial effusion. Mediastinum/Nodes: No enlarged mediastinal or axillary lymph nodes. Thyroid gland, trachea, and esophagus demonstrate no significant findings. Lungs/Pleura: A 3.7 cm x 2.1 cm area of patchy airspace disease is seen within the lateral aspect of the right upper lobe. Additional 7.3 cm x 3.9 cm patchy area of airspace disease is seen along the anterior aspect of the left hilum. This corresponds to the area of abnormality seen within this region on the prior chest plain film. Small patchy areas of airspace disease are seen bilateral lower lobes and posteromedial aspect of the right upper lobe. There are small bilateral pleural effusions. No pneumothorax is identified. Upper Abdomen: Metallic density surgical coils are seen within the right upper quadrant. Musculoskeletal: Multilevel degenerative changes seen throughout the thoracic spine. IMPRESSION: 1. Patchy bilateral areas of airspace disease, most pronounced within the lateral aspect of the right upper lobe and along the anterior aspect of the  left hilum. Follow-up to resolution is recommended, as the presence of an underlying neoplastic process cannot be excluded. 2. Small bilateral pleural effusions. Aortic Atherosclerosis (ICD10-I70.0). Electronically Signed   By: Aram Candela M.D.   On: 02/05/2020 22:05   DG Chest Portable 1 View  Result Date: 02/05/2020 CLINICAL DATA:  Atrial fibrillation with rapid ventricular response EXAM: PORTABLE CHEST 1 VIEW COMPARISON:  06/02/2014 FINDINGS: Two frontal views of the chest demonstrate an enlarged cardiac silhouette with prominent left atrial dilatation. Increased density at the left hilum could  reflect left suprahilar consolidation, though underlying mass could be considered. No effusion or pneumothorax. No acute bony abnormalities. IMPRESSION: 1. Left hilar density, differential includes left upper lobe consolidation versus left hilar mass. Follow-up chest CT without IV contrast may be useful when clinical situation permits. 2. Cardiomegaly with enlarged left atrium, stable. Electronically Signed   By: Sharlet Salina M.D.   On: 02/05/2020 19:13    Procedures Procedures (including critical care time)  CRITICAL CARE Performed by: Canary Brim Gottlieb Zuercher Total critical care time: 35 minutes Critical care time was exclusive of separately billable procedures and treating other patients. Critical care was necessary to treat or prevent imminent or life-threatening deterioration. Critical care was time spent personally by me on the following activities: development of treatment plan with patient and/or surrogate as well as nursing, discussions with consultants, evaluation of patient's response to treatment, examination of patient, obtaining history from patient or surrogate, ordering and performing treatments and interventions, ordering and review of laboratory studies, ordering and review of radiographic studies, pulse oximetry and re-evaluation of patient's condition.   Medications Ordered in  ED Medications  diltiazem (CARDIZEM) 125 mg in dextrose 5% 125 mL (1 mg/mL) infusion (7.5 mg/hr Intravenous Rate/Dose Change 02/05/20 2115)  sodium chloride flush (NS) 0.9 % injection 3 mL (3 mLs Intravenous Given 02/05/20 1834)  sodium chloride 0.9 % bolus 500 mL (0 mLs Intravenous Stopped 02/05/20 2021)  sodium chloride 0.9 % bolus 500 mL (0 mLs Intravenous Stopped 02/05/20 2201)    ED Course  I have reviewed the triage vital signs and the nursing notes.  Pertinent labs & imaging results that were available during my care of the patient were reviewed by me and considered in my medical decision making (see chart for details).    MDM Rules/Calculators/A&P                      Bryan Moran is a 74 y.o. male with a past medical history significant for atrial fibrillation not on anticoagulation due to prior GI hemorrhage, CHF, hypertension, and eczema who presents with severe fatigue, lightheadedness, shortness of breath, several weeks of watery diarrhea, decreased urination, lightheadedness/near syncope, and mild cough.  He specifically denies any chest pain, palpitations, or abdominal pain for me.  He reports this is been ongoing for the last week or so.  He thinks that his second coronavirus shot may have contributed to his symptoms that he had 1 week ago.  He reports he thinks he is dehydrated due to the persistent diarrhea for approximately 3 weeks is very watery.  He also has had decreased appetite and has not been eating or drinking as much.  He reports nausea but no vomiting.  He reports has had a dry cough at times but has not had any production.  He reports feeling some chills but no fevers at home.  He feels general malaise.  On exam, patient is tachycardic in the 130s to 140s with A. fib.  EKG does not show STEMI but confirms the A. fib.  His blood pressure on my initial evaluation was in the 90s systolic.  Will order a gentle amount of fluids for rehydration given the reported nausea, decreased  oral intake, and diarrhea.  He had no focal neurologic deficits on my exam.  Abdomen was nontender.  Chest was nontender.  No murmur.  Legs were not significant edematous and his lungs did not have rales.  Clinically he appears more dehydrated with dry mucous membranes than  fluid overloaded.  We will give some fluids for the soft blood pressures and fast heart rate.  He reports that his A. fib also was provoked in the past by anemia from GI bleed.  Although he denies any GI bleed symptoms, we will check blood counts.  We will also get work-up to look for occult infection prompting this.  We will start him on a diltiazem drip to try to improve his rate and the fluids should help with his blood pressure.  Anticipate admission after work-up due to A. fib with RVR with near syncope, blood pressure in the 90s, and ill appearance.  8:25 PM Work-up again returned.  Patient does have elevated BNP elevated at 789.9.  Troponin is also elevated at 75, will trend.  CBC shows no leukocytosis or anemia.  Patient has acute kidney injury grade to prior with creatinine of 1.73.  Blood pressure has elevated significantly after getting some fluids and being started on the diltiazem.  We will continue to monitor this.  He still has no chest pain or abdominal pain.  I am concerned about fluid balance being a problem with his AKI and elevated BNP in the setting of A. fib with RVR.  Chest x-ray showed concern for a opacity either infectious or mass.  CT without contrast recommended.  Due to the AKI, will do a noncontrast CT.  With lack of any chest pain or abdominal pain and previously low blood pressures, have low suspicion for dissection at this time.    We will get the CT scan and then call for admission for A. fib with RVR, AKI, and heart failure.    Final Clinical Impression(s) / ED Diagnoses Final diagnoses:  Atrial fibrillation with RVR (HCC)  Acute on chronic congestive heart failure, unspecified heart failure type  (HCC)  AKI (acute kidney injury) (HCC)  Diarrhea, unspecified type  Dehydration    Clinical Impression: 1. Atrial fibrillation with RVR (HCC)   2. Acute on chronic congestive heart failure, unspecified heart failure type (HCC)   3. AKI (acute kidney injury) (HCC)   4. Diarrhea, unspecified type   5. Dehydration     Disposition: Admit  This note was prepared with assistance of Dragon voice recognition software. Occasional wrong-word or sound-a-like substitutions may have occurred due to the inherent limitations of voice recognition software.     Tierra Thoma, Canary Brim, MD 02/05/20 [phone]

## 2020-02-05 NOTE — ED Triage Notes (Signed)
Per [facility] pt from home w/increase lethargy, BLE weakness, diarrhea, SOB, abd/chest pain x1 week s/p covid vaccine.  Pt states; "I just cant go on, I'm terribly weak."   128/70 118 18 97.2 CBG 221  20g RH 100 cc NS

## 2020-02-05 NOTE — H&P (Addendum)
History and Physical    Bryan Moran MGQ:[id number] DOB: 12/17/1945 DOA: 02/05/2020  PCP: Lorene Dy, MD  Patient coming from: Home.  Chief Complaint: Weakness and shortness of breath.  HPI: Bryan Moran is a 74 y.o. male with history of paroxysmal atrial fibrillation chronic systolic heart failure last EF was 25 to 30% refused ICD presently on lisinopril has been experiencing multiple episodes of diarrhea for almost a week now with no nausea vomiting or abdominal pain or fever chills.  Denies any chest pain but has been having some shortness of breath.  Patient states he is weak that he is not able to walk properly but did not have any fall or any focal deficits or any difficulties speaking or swallowing.  Denies any recent sick contacts or travel.  ED Course: In the ER patient was in A. fib with RVR blood pressure was fluctuating between normal to very high and was started on Cardizem infusion.  Due to mildly elevated lactate and low normal blood pressure final cc normal saline bolus was given for which blood pressure has remained stable.  Patient's creatinine has increased from 1.1 about 3 years ago to 1.70.  LFTs are normal abdomen appears benign.  Chest x-ray showing features concerning for left hilar mass with upper lobe density for which CT chest was recommended and CT chest showed bilateral hilar airspace disease for which follow-up was recommended.  Patient is afebrile denies any productive cough fever or chills.  No definite signs of any pneumonia.  BNP was elevated at 789 high sensitive troponin was 75-81 lactic acid was 2.4 going improved to 2.1 CBC was unremarkable.  Mild lower extremity edema.  Stool studies have been ordered.  Patient admitted for A. fib with RVR with shortness of breath which likely could be from CHF.  And diarrhea.  Review of Systems: As per HPI, rest all negative.   Past Medical History:  Diagnosis Date  . A-fib ([place])    a. Officially dx 05/27/14, sx may have been  going on longer.  . Acute blood loss anemia   . Acute duodenal ulcer with hemorrhage 06/10/2014  . Atrial fibrillation, persistent ([place])   . Coagulopathy ([place])   . Dysrhythmia   . Eczema   . Hypertension   . Venous insufficiency     Past Surgical History:  Procedure Laterality Date  . CARDIOVERSION N/A 06/05/2014   Procedure: CARDIOVERSION;  Surgeon: Sueanne Margarita, MD;  Location: [facility] ENDOSCOPY;  Service: Cardiovascular;  Laterality: N/A;  . ESOPHAGOGASTRODUODENOSCOPY N/A 06/07/2014   Procedure: ESOPHAGOGASTRODUODENOSCOPY (EGD);  Surgeon: Beryle Beams, MD;  Location: [facility] ENDOSCOPY;  Service: Endoscopy;  Laterality: N/A;  . ESOPHAGOGASTRODUODENOSCOPY N/A 06/12/2014   Procedure: ESOPHAGOGASTRODUODENOSCOPY (EGD);  Surgeon: Gatha Mayer, MD;  Location: [facility] ENDOSCOPY;  Service: Endoscopy;  Laterality: N/A;  . [place]   [facility]  . TEE WITHOUT CARDIOVERSION N/A 06/05/2014   Procedure: TRANSESOPHAGEAL ECHOCARDIOGRAM (TEE);  Surgeon: Sueanne Margarita, MD;  Location: [facility] ENDOSCOPY;  Service: Cardiovascular;  Laterality: N/A;     reports that he has never smoked. He has never used smokeless tobacco. He reports that he does not drink alcohol or use drugs.  No Known Allergies  Family History  Problem Relation Age of Onset  . Depression Father   . Heart disease Father        Some sort of heart issue at end of life - possibly CAD  . Multiple sclerosis Mother   . Cancer Mother  Tumor in kidney, spread to bones    Prior to Admission medications   Medication Sig Start Date End Date Taking? Authorizing Provider  clotrimazole-betamethasone (LOTRISONE) cream Apply 1 application topically daily.  12/28/19  Yes [provider]  Dermatological Products, Misc. (ELETONE EX) Apply 1 application topically 2 (two) times daily as needed (eczema).   Yes [provider]  furosemide (LASIX) 40 MG tablet Take 1 tablet (40 mg total) by mouth daily as needed (swelling).  02/17/17  Yes Lars Masson, MD  lisinopril (PRINIVIL,ZESTRIL) 40 MG tablet Take 1 tablet (40 mg total) by mouth daily. Please keep upcoming appt for future refills. Thank you Patient taking differently: Take 40 mg by mouth daily.  11/04/17  Yes Lars Masson, MD  triamcinolone cream (KENALOG) 0.1 % SMARTSIG:1 Application Topical 2-3 Times Daily 09/20/19  Yes [provider]  carvedilol (COREG) 25 MG tablet Take 1 tablet (25 mg total) by mouth 2 (two) times daily with a meal. Patient not taking: Reported on 02/05/2020 12/15/17 07/27/18  Lars Masson, MD  pantoprazole (PROTONIX) 40 MG tablet Take 1 tablet (40 mg total) by mouth daily. Patient not taking: Reported on 02/05/2020 06/24/14   Hillis Range, MD  spironolactone (ALDACTONE) 25 MG tablet Take 1 tablet (25 mg total) by mouth daily. Please keep upcoming appt for future refills. Thank you Patient not taking: Reported on 02/05/2020 11/04/17   Lars Masson, MD    Physical Exam: Constitutional: Moderately built and nourished. Vitals:   02/05/20 2120 02/05/20 2130 02/05/20 2200 02/05/20 2220  BP: 99/75 105/90 111/77 111/78  Pulse: (!) 112 79 (!) 105 (!) 106  Resp: (!) 25 16 (!) 27 (!) 27  Temp:      TempSrc:      SpO2: 95% 96% 94% 97%   Eyes: Anicteric no pallor. ENMT: No discharge from the ears eyes nose or mouth. Neck: No mass felt.  No neck rigidity. Respiratory: No rhonchi or crepitations. Cardiovascular: S1-S2 heard. Abdomen: Soft nontender bowel sounds present. Musculoskeletal: Mild bilateral lower extremity edema present. Skin: No rash. Neurologic: Alert awake oriented to time place and person.  Moves all extremities. Psychiatric: Appears normal.  Normal affect.   Labs on Admission: I have personally reviewed following labs and imaging studies  CBC: Recent Labs  Lab 02/05/20 1800  WBC 7.0  HGB 15.8  HCT 50.3  MCV 100.0  PLT 161   Basic Metabolic Panel: Recent Labs  Lab 02/05/20 1800  NA  143  K 4.3  CL 108  CO2 24  GLUCOSE 86  BUN 32*  CREATININE 1.73*  CALCIUM 8.6*  MG 2.5*   GFR: CrCl cannot be calculated (Unknown ideal weight.). Liver Function Tests: Recent Labs  Lab 02/05/20 1800  AST 28  ALT 41  ALKPHOS 52  BILITOT 2.0*  PROT 5.1*  ALBUMIN 2.8*   Recent Labs  Lab 02/05/20 1800  LIPASE 28   No results for input(s): AMMONIA in the last 168 hours. Coagulation Profile: No results for input(s): INR, PROTIME in the last 168 hours. Cardiac Enzymes: No results for input(s): CKTOTAL, CKMB, CKMBINDEX, TROPONINI in the last 168 hours. BNP (last 3 results) No results for input(s): PROBNP in the last 8760 hours. HbA1C: No results for input(s): HGBA1C in the last 72 hours. CBG: No results for input(s): GLUCAP in the last 168 hours. Lipid Profile: No results for input(s): CHOL, HDL, LDLCALC, TRIG, CHOLHDL, LDLDIRECT in the last 72 hours. Thyroid Function Tests: No results for input(s):  TSH, T4TOTAL, FREET4, T3FREE, THYROIDAB in the last 72 hours. Anemia Panel: No results for input(s): VITAMINB12, FOLATE, FERRITIN, TIBC, IRON, RETICCTPCT in the last 72 hours. Urine analysis: No results found for: COLORURINE, APPEARANCEUR, LABSPEC, PHURINE, GLUCOSEU, HGBUR, BILIRUBINUR, KETONESUR, PROTEINUR, UROBILINOGEN, NITRITE, LEUKOCYTESUR Sepsis Labs: @LABRCNTIP (procalcitonin:4,lacticidven:4) ) Recent Results (from the past 240 hour(s))  SARS Coronavirus 2 by RT PCR (hospital order, performed in [facility] hospital lab) Nasopharyngeal Nasopharyngeal Swab     Status: None   Collection Time: 02/05/20  6:36 PM   Specimen: Nasopharyngeal Swab  Result Value Ref Range Status   SARS Coronavirus 2 NEGATIVE NEGATIVE Final    Comment: (NOTE) SARS-CoV-2 target nucleic acids are NOT DETECTED. The SARS-CoV-2 RNA is generally detectable in upper and lower respiratory specimens during the acute phase of infection. The lowest concentration of SARS-CoV-2 viral copies this assay  can detect is 250 copies / mL. A negative result does not preclude SARS-CoV-2 infection and should not be used as the sole basis for treatment or other patient management decisions.  A negative result may occur with improper specimen collection / handling, submission of specimen other than nasopharyngeal swab, presence of viral mutation(s) within the areas targeted by this assay, and inadequate number of viral copies (<250 copies / mL). A negative result must be combined with clinical observations, patient history, and epidemiological information. Fact Sheet for Patients:   04/06/20 Fact Sheet for Healthcare Providers: BoilerBrush.com.cy This test is not yet approved or cleared  by the https://pope.com/ FDA and has been authorized for detection and/or diagnosis of SARS-CoV-2 by FDA under an Emergency Use Authorization (EUA).  This EUA will remain in effect (meaning this test can be used) for the duration of the COVID-19 declaration under Section 564(b)(1) of the Act, 21 U.S.C. section 360bbb-3(b)(1), unless the authorization is terminated or revoked sooner. Performed at [facility], [address] [place], [place]      Radiological Exams on Admission: CT Chest Wo Contrast  Result Date: 02/05/2020 CLINICAL DATA:  Shortness of breath. EXAM: CT CHEST WITHOUT CONTRAST TECHNIQUE: Multidetector CT imaging of the chest was performed following the standard protocol without IV contrast. COMPARISON:  Chest plain film, dated Feb 05, 2020. FINDINGS: Cardiovascular: There is mild calcification of the aortic arch. There is mild to moderate severity cardiomegaly. No pericardial effusion. Mediastinum/Nodes: No enlarged mediastinal or axillary lymph nodes. Thyroid gland, trachea, and esophagus demonstrate no significant findings. Lungs/Pleura: A 3.7 cm x 2.1 cm area of patchy airspace disease is seen within the lateral aspect of the  right upper lobe. Additional 7.3 cm x 3.9 cm patchy area of airspace disease is seen along the anterior aspect of the left hilum. This corresponds to the area of abnormality seen within this region on the prior chest plain film. Small patchy areas of airspace disease are seen bilateral lower lobes and posteromedial aspect of the right upper lobe. There are small bilateral pleural effusions. No pneumothorax is identified. Upper Abdomen: Metallic density surgical coils are seen within the right upper quadrant. Musculoskeletal: Multilevel degenerative changes seen throughout the thoracic spine. IMPRESSION: 1. Patchy bilateral areas of airspace disease, most pronounced within the lateral aspect of the right upper lobe and along the anterior aspect of the left hilum. Follow-up to resolution is recommended, as the presence of an underlying neoplastic process cannot be excluded. 2. Small bilateral pleural effusions. Aortic Atherosclerosis (ICD10-I70.0). Electronically Signed   By: Feb 07, 2020 M.D.   On: 02/05/2020 22:05   DG  Chest Portable 1 View  Result Date: 02/05/2020 CLINICAL DATA:  Atrial fibrillation with rapid ventricular response EXAM: PORTABLE CHEST 1 VIEW COMPARISON:  06/02/2014 FINDINGS: Two frontal views of the chest demonstrate an enlarged cardiac silhouette with prominent left atrial dilatation. Increased density at the left hilum could reflect left suprahilar consolidation, though underlying mass could be considered. No effusion or pneumothorax. No acute bony abnormalities. IMPRESSION: 1. Left hilar density, differential includes left upper lobe consolidation versus left hilar mass. Follow-up chest CT without IV contrast may be useful when clinical situation permits. 2. Cardiomegaly with enlarged left atrium, stable. Electronically Signed   By: Sharlet Salina M.D.   On: 02/05/2020 19:13    EKG: Independently reviewed.  A. fib with RVR.  Assessment/Plan Active Problems:   Chronic systolic  dysfunction of left ventricle   HTN (hypertension)   Atrial fibrillation with RVR (H[facility])   Diarrhea   AKI (acute kidney injury) (HCC)    1. A. fib with RVR for which patient has been started on Cardizem infusion.  Will start Coreg and try to wean off Cardizem.  Check TSH trend troponins, D-dimer check 2D echo consult cardiology.  Patient's chads 2 vasc score is at least 3 but patient is not on any anticoagulation secondary to previous massive GI bleed. 2. Shortness of breath with abnormal CT chest no definite evidence of pneumonia clinically.  Will check procalcitonin blood cultures.  Suspect patient's shortness of breath is likely from CHF for which we will check 2D echo.  Patient's blood pressures are low normal so not diuresing.  Check D-dimer. 3. History of chronic systolic heart failure last EF measured was 25 to 30% on lisinopril.  Lisinopril on hold due to low normal blood pressure.  Recheck 2D echo consult cardiology.  Likely reason for patient's shortness of breath. 4. Diarrhea cause not clear stool studies have been ordered. 5. Generalized weakness could be from fatigue from A. fib with RVR.  Could also be contributed from patient's diarrhea and dehydration.  No definite focal deficits.  Closely observe.  Will need physical therapy consult. 6. Acute on chronic kidney disease stage III creatinine likely worsened because of diarrhea and patient be on lisinopril.  Patient did receive fluid bolus in the ER.  Repeat metabolic panel will follow intake output and UA.  Patient has abnormal CT chest will need close follow-up as outpatient.   DVT prophylaxis: Lovenox. Code Status: Full code. Family Communication: Discussed with patient. Disposition Plan: Home. Consults called: Cardiology. Admission status: Observation.   Eduard Clos MD [facility] Pager [phone].  If 7PM-7AM, please contact night-coverage [email or web address] Password [email or web address]  02/05/2020, 11:22 PM

## 2020-02-06 ENCOUNTER — Encounter (HOSPITAL_COMMUNITY): Payer: Self-pay | Admitting: Internal Medicine

## 2020-02-06 ENCOUNTER — Observation Stay (HOSPITAL_COMMUNITY): Payer: BC Managed Care – PPO

## 2020-02-06 DIAGNOSIS — Z79899 Other long term (current) drug therapy: Secondary | ICD-10-CM | POA: Diagnosis not present

## 2020-02-06 DIAGNOSIS — Z8249 Family history of ischemic heart disease and other diseases of the circulatory system: Secondary | ICD-10-CM | POA: Diagnosis not present

## 2020-02-06 DIAGNOSIS — E876 Hypokalemia: Secondary | ICD-10-CM | POA: Diagnosis present

## 2020-02-06 DIAGNOSIS — I361 Nonrheumatic tricuspid (valve) insufficiency: Secondary | ICD-10-CM | POA: Diagnosis not present

## 2020-02-06 DIAGNOSIS — I340 Nonrheumatic mitral (valve) insufficiency: Secondary | ICD-10-CM | POA: Diagnosis not present

## 2020-02-06 DIAGNOSIS — Z820 Family history of epilepsy and other diseases of the nervous system: Secondary | ICD-10-CM | POA: Diagnosis not present

## 2020-02-06 DIAGNOSIS — J9601 Acute respiratory failure with hypoxia: Secondary | ICD-10-CM | POA: Diagnosis present

## 2020-02-06 DIAGNOSIS — I4891 Unspecified atrial fibrillation: Secondary | ICD-10-CM

## 2020-02-06 DIAGNOSIS — I447 Left bundle-branch block, unspecified: Secondary | ICD-10-CM | POA: Diagnosis present

## 2020-02-06 DIAGNOSIS — Z809 Family history of malignant neoplasm, unspecified: Secondary | ICD-10-CM | POA: Diagnosis not present

## 2020-02-06 DIAGNOSIS — I351 Nonrheumatic aortic (valve) insufficiency: Secondary | ICD-10-CM | POA: Diagnosis not present

## 2020-02-06 DIAGNOSIS — Z20822 Contact with and (suspected) exposure to covid-19: Secondary | ICD-10-CM | POA: Diagnosis present

## 2020-02-06 DIAGNOSIS — E860 Dehydration: Secondary | ICD-10-CM | POA: Diagnosis present

## 2020-02-06 DIAGNOSIS — I959 Hypotension, unspecified: Secondary | ICD-10-CM | POA: Diagnosis not present

## 2020-02-06 DIAGNOSIS — Z79890 Hormone replacement therapy (postmenopausal): Secondary | ICD-10-CM | POA: Diagnosis not present

## 2020-02-06 DIAGNOSIS — I5043 Acute on chronic combined systolic (congestive) and diastolic (congestive) heart failure: Secondary | ICD-10-CM | POA: Diagnosis present

## 2020-02-06 DIAGNOSIS — K922 Gastrointestinal hemorrhage, unspecified: Secondary | ICD-10-CM | POA: Diagnosis present

## 2020-02-06 DIAGNOSIS — I4821 Permanent atrial fibrillation: Secondary | ICD-10-CM | POA: Diagnosis present

## 2020-02-06 DIAGNOSIS — J9621 Acute and chronic respiratory failure with hypoxia: Secondary | ICD-10-CM | POA: Diagnosis present

## 2020-02-06 DIAGNOSIS — I519 Heart disease, unspecified: Secondary | ICD-10-CM | POA: Diagnosis not present

## 2020-02-06 DIAGNOSIS — N179 Acute kidney failure, unspecified: Secondary | ICD-10-CM | POA: Diagnosis present

## 2020-02-06 DIAGNOSIS — N1831 Chronic kidney disease, stage 3a: Secondary | ICD-10-CM | POA: Diagnosis present

## 2020-02-06 DIAGNOSIS — Z9119 Patient's noncompliance with other medical treatment and regimen: Secondary | ICD-10-CM | POA: Diagnosis not present

## 2020-02-06 DIAGNOSIS — I130 Hypertensive heart and chronic kidney disease with heart failure and stage 1 through stage 4 chronic kidney disease, or unspecified chronic kidney disease: Secondary | ICD-10-CM | POA: Diagnosis present

## 2020-02-06 DIAGNOSIS — Z818 Family history of other mental and behavioral disorders: Secondary | ICD-10-CM | POA: Diagnosis not present

## 2020-02-06 DIAGNOSIS — I10 Essential (primary) hypertension: Secondary | ICD-10-CM | POA: Diagnosis not present

## 2020-02-06 DIAGNOSIS — Z9114 Patient's other noncompliance with medication regimen: Secondary | ICD-10-CM | POA: Diagnosis not present

## 2020-02-06 LAB — URINALYSIS, ROUTINE W REFLEX MICROSCOPIC
Bacteria, UA: NONE SEEN
Bilirubin Urine: NEGATIVE
Glucose, UA: NEGATIVE mg/dL
Ketones, ur: NEGATIVE mg/dL
Leukocytes,Ua: NEGATIVE
Nitrite: NEGATIVE
Protein, ur: 30 mg/dL — AB
Specific Gravity, Urine: 1.026 (ref 1.005–1.030)
pH: 5 (ref 5.0–8.0)

## 2020-02-06 LAB — CBC
HCT: 46.9 % (ref 39.0–52.0)
Hemoglobin: 15.4 g/dL (ref 13.0–17.0)
MCH: 32.4 pg (ref 26.0–34.0)
MCHC: 32.8 g/dL (ref 30.0–36.0)
MCV: 98.5 fL (ref 80.0–100.0)
Platelets: 145 10*3/uL — ABNORMAL LOW (ref 150–400)
RBC: 4.76 MIL/uL (ref 4.22–5.81)
RDW: 15.7 % — ABNORMAL HIGH (ref 11.5–15.5)
WBC: 7.4 10*3/uL (ref 4.0–10.5)
nRBC: 0 % (ref 0.0–0.2)

## 2020-02-06 LAB — URINE CULTURE: Culture: NO GROWTH

## 2020-02-06 LAB — BASIC METABOLIC PANEL
Anion gap: 11 (ref 5–15)
BUN: 29 mg/dL — ABNORMAL HIGH (ref 8–23)
CO2: 20 mmol/L — ABNORMAL LOW (ref 22–32)
Calcium: 8.2 mg/dL — ABNORMAL LOW (ref 8.9–10.3)
Chloride: 109 mmol/L (ref 98–111)
Creatinine, Ser: 1.36 mg/dL — ABNORMAL HIGH (ref 0.61–1.24)
GFR calc Af Amer: 60 mL/min — ABNORMAL LOW (ref >60)
GFR calc non Af Amer: 52 mL/min — ABNORMAL LOW (ref >60)
Glucose, Bld: 99 mg/dL (ref 70–99)
Potassium: 3.8 mmol/L (ref 3.5–5.1)
Sodium: 140 mmol/L (ref 135–145)

## 2020-02-06 LAB — HEPATIC FUNCTION PANEL
ALT: 35 U/L (ref 0–44)
AST: 21 U/L (ref 15–41)
Albumin: 2.7 g/dL — ABNORMAL LOW (ref 3.5–5.0)
Alkaline Phosphatase: 53 U/L (ref 38–126)
Bilirubin, Direct: 0.6 mg/dL — ABNORMAL HIGH (ref 0.0–0.2)
Indirect Bilirubin: 2.1 mg/dL — ABNORMAL HIGH (ref 0.3–0.9)
Total Bilirubin: 2.7 mg/dL — ABNORMAL HIGH (ref 0.3–1.2)
Total Protein: 5 g/dL — ABNORMAL LOW (ref 6.5–8.1)

## 2020-02-06 LAB — C DIFFICILE QUICK SCREEN W PCR REFLEX
C Diff antigen: NEGATIVE
C Diff interpretation: NOT DETECTED
C Diff toxin: NEGATIVE

## 2020-02-06 LAB — MRSA PCR SCREENING: MRSA by PCR: NEGATIVE

## 2020-02-06 LAB — TSH: TSH: 3.183 u[IU]/mL (ref 0.350–4.500)

## 2020-02-06 LAB — LACTIC ACID, PLASMA: Lactic Acid, Venous: 1.3 mmol/L (ref 0.5–1.9)

## 2020-02-06 LAB — ECHOCARDIOGRAM COMPLETE

## 2020-02-06 LAB — D-DIMER, QUANTITATIVE: D-Dimer, Quant: 1.92 ug/mL-FEU — ABNORMAL HIGH (ref 0.00–0.50)

## 2020-02-06 LAB — TROPONIN I (HIGH SENSITIVITY): Troponin I (High Sensitivity): 75 ng/L — ABNORMAL HIGH (ref <18)

## 2020-02-06 LAB — PROCALCITONIN: Procalcitonin: <0.1 ng/mL

## 2020-02-06 MED ORDER — CLOTRIMAZOLE-BETAMETHASONE 1-0.05 % EX CREA
1.0000 "application " | TOPICAL_CREAM | Freq: Every day | CUTANEOUS | Status: DC
  Administered 2020-02-06 – 2020-02-08 (×3): 1 via TOPICAL
  Filled 2020-02-06: qty 15

## 2020-02-06 MED ORDER — AMIODARONE IV BOLUS ONLY 150 MG/100ML: 150.0000 mg | Freq: Once | INTRAVENOUS | Status: DC

## 2020-02-06 MED ORDER — SODIUM CHLORIDE 0.9 % IV BOLUS
500.0000 mL | Freq: Once | INTRAVENOUS | Status: AC
  Administered 2020-02-06: 19:00:00 500 mL via INTRAVENOUS

## 2020-02-06 MED ORDER — AMIODARONE HCL IN DEXTROSE 360-4.14 MG/200ML-% IV SOLN
30.0000 mg/h | INTRAVENOUS | Status: DC
  Administered 2020-02-07 (×2): 30 mg/h via INTRAVENOUS
  Filled 2020-02-06 (×3): qty 200

## 2020-02-06 MED ORDER — SENNOSIDES-DOCUSATE SODIUM 8.6-50 MG PO TABS: 2.0000 | ORAL_TABLET | Freq: Every evening | ORAL | Status: DC | PRN

## 2020-02-06 MED ORDER — FUROSEMIDE 40 MG PO TABS
40.0000 mg | ORAL_TABLET | Freq: Two times a day (BID) | ORAL | Status: DC
  Administered 2020-02-06 – 2020-02-13 (×14): 40 mg via ORAL
  Filled 2020-02-06 (×14): qty 1

## 2020-02-06 MED ORDER — AMIODARONE LOAD VIA INFUSION
150.0000 mg | Freq: Once | INTRAVENOUS | Status: AC
  Administered 2020-02-06: 15:00:00 150 mg via INTRAVENOUS
  Filled 2020-02-06: qty 83.34

## 2020-02-06 MED ORDER — CARVEDILOL 12.5 MG PO TABS
12.5000 mg | ORAL_TABLET | Freq: Two times a day (BID) | ORAL | Status: DC
  Administered 2020-02-06: 15:00:00 12.5 mg via ORAL
  Filled 2020-02-06: qty 1

## 2020-02-06 MED ORDER — POLYETHYLENE GLYCOL 3350 17 G PO PACK: 17.0000 g | PACK | Freq: Every day | ORAL | Status: DC | PRN

## 2020-02-06 MED ORDER — AMIODARONE LOAD VIA INFUSION: 150.0000 mg | Freq: Once | INTRAVENOUS | Status: DC

## 2020-02-06 MED ORDER — AMIODARONE HCL IN DEXTROSE 360-4.14 MG/200ML-% IV SOLN
60.0000 mg/h | INTRAVENOUS | Status: DC
  Administered 2020-02-06: 60 mg/h via INTRAVENOUS
  Filled 2020-02-06: qty 200

## 2020-02-06 MED ORDER — APIXABAN 5 MG PO TABS
5.0000 mg | ORAL_TABLET | Freq: Two times a day (BID) | ORAL | Status: DC
  Administered 2020-02-06 – 2020-02-13 (×15): 5 mg via ORAL
  Filled 2020-02-06 (×16): qty 1

## 2020-02-06 MED ORDER — WHITE PETROLATUM EX OINT: TOPICAL_OINTMENT | Freq: Two times a day (BID) | CUTANEOUS | Status: DC | PRN

## 2020-02-06 MED ORDER — ELETONE EX CREA: TOPICAL_CREAM | Freq: Two times a day (BID) | CUTANEOUS | Status: DC | PRN

## 2020-02-06 MED ORDER — METOPROLOL TARTRATE 5 MG/5ML IV SOLN: 5.0000 mg | INTRAVENOUS | Status: DC | PRN

## 2020-02-06 MED ORDER — IPRATROPIUM-ALBUTEROL 0.5-2.5 (3) MG/3ML IN SOLN
3.0000 mL | RESPIRATORY_TRACT | Status: DC | PRN
  Administered 2020-02-12 – 2020-02-13 (×3): 3 mL via RESPIRATORY_TRACT
  Filled 2020-02-06 (×3): qty 3

## 2020-02-06 NOTE — ED Notes (Signed)
SDU  Breakfast Tray Ordered 

## 2020-02-06 NOTE — ED Notes (Signed)
Call to [facility], RN to call back for report

## 2020-02-06 NOTE — ED Notes (Signed)
Lunch Tray Ordered @ 1043. 

## 2020-02-06 NOTE — ED Notes (Signed)
On report from [place], Charity fundraiser, she reported she was only able to obtain 1 set of [facility] and made the doctor aware.

## 2020-02-06 NOTE — Progress Notes (Signed)
PT Cancellation Note  Patient Details Name: Bryan Moran MRN: [id number] DOB: 07/24/1946   Cancelled Treatment:    Reason Eval/Treat Not Completed: Patient not medically ready Spoke with RN and HR still elevated. Will hold until pt medically appropriate and follow up as schedule allows.   Cindee Salt, DPT  Acute Rehabilitation Services  Pager: [phone] Office: [phone]    Bryan Moran 02/06/2020, 2:48 PM

## 2020-02-06 NOTE — Progress Notes (Signed)
PROGRESS NOTE    Bryan Moran  WYO:[id number] DOB: 14-Mar-1946 DOA: 02/05/2020 PCP: Burton Apley, MD   Brief Narrative:  73 years with history of paroxysmal A. fib, chronic systolic CHF EF 25% refused ICD presents with multiple episodes of diarrhea in atrial fibrillation with RVR, lactic acidosis.  Also noted to be in CHF exacerbation   Assessment & Plan:   Active Problems:   Chronic systolic dysfunction of left ventricle   HTN (hypertension)   Atrial fibrillation with RVR (HCC)   Diarrhea   AKI (acute kidney injury) (HCC)  Atrial fibrillation with RVR -TSH-normal -Echocardiogram-pending -Consulted cardiology-appreciate input. -Started Coreg, Cardizem drip and IV amiodarone.  If no spontaneous conversion, planned cardioversion on Friday.  Wean off Cardizem drip.  Shortness of breath secondary acute congestive heart failure with ejection fraction 25-30%; Class III -will place him on Lasix 40 mg twice daily p.o. Cont Coreg. Rest of meds eventually per cardiology.   Generalized weakness -Once heart rate is better only PT/OT  AKI chronic CKD stage III -Baseline creatinine around 1.3.  Admission creatinine 1.7.  Closely monitor.  Medication noncompliance -In the past he has refused multiple medications.  Essential hypertension -Lisinopril is currently on hold.  Apparently has not been taking his Aldactone or other medications.   DVT prophylaxis: Eliquis Code Status: Full code Family Communication: None at bedside  Status is: Inpatient   Dispo: The patient is from: Home              Anticipated d/c is to: Home              Anticipated d/c date is: 2 days              Patient currently is not medically stable to d/c.  Receiving oral diuretics as able.  Needs better rate control.  Currently on amiodarone and Cardizem drip.     Consultants:  Cardiology   Subjective: Seen and examined at bedside.  Had mild, to his penis as he was urinating in the ER appears to be  self-limiting.  States his shortness of breath and weakness is slightly better since his weight is improved.  Review of Systems Otherwise negative except as per HPI, including: General: Denies fever, chills, night sweats or unintended weight loss. Resp: Denies cough, wheezing, shortness of breath. Cardiac: Denies chest pain, palpitations, orthopnea, paroxysmal nocturnal dyspnea. GI: Denies abdominal pain, nausea, vomiting, diarrhea or constipation GU: Denies dysuria, frequency, hesitancy or incontinence MS: Denies muscle aches, joint pain or swelling Neuro: Denies headache, neurologic deficits (focal weakness, numbness, tingling), abnormal gait Psych: Denies anxiety, depression, SI/HI/AVH Skin: Denies new rashes or lesions ID: Denies sick contacts, exotic exposures, travel  Examination:  Constitutional: Not in acute distress Respiratory: Bibasilar crackles Cardiovascular: Irregularly irregular Abdomen: Nontender nondistended good bowel sounds Musculoskeletal: 1+ bilateral lower extremity pitting edema Skin: No rashes seen Neurologic: CN 2-12 grossly intact.  And nonfocal Psychiatric: Normal judgment and insight. Alert and oriented x 3. Normal mood.  Objective: Vitals:   02/06/20 0710 02/06/20 0715 02/06/20 0720 02/06/20 0730  BP:      Pulse: (!) 103 93 89 82  Resp:      Temp:      TempSrc:      SpO2:  93% 96% 95%   No intake or output data in the 24 hours ending 02/06/20 0922 There were no vitals filed for this visit.   Data Reviewed:   CBC: Recent Labs  Lab 02/05/20 1800 02/06/20 0012  WBC 7.0  7.4  HGB 15.8 15.4  HCT 50.3 46.9  MCV 100.0 98.5  PLT 161 500*   Basic Metabolic Panel: Recent Labs  Lab 02/05/20 1800 02/06/20 0510  NA 143 140  K 4.3 3.8  CL 108 109  CO2 24 20*  GLUCOSE 86 99  BUN 32* 29*  CREATININE 1.73* 1.36*  CALCIUM 8.6* 8.2*  MG 2.5*  --    GFR: CrCl cannot be calculated (Unknown ideal weight.). Liver Function Tests: Recent Labs   Lab 02/05/20 1800 02/06/20 0510  AST 28 21  ALT 41 35  ALKPHOS 52 53  BILITOT 2.0* 2.7*  PROT 5.1* 5.0*  ALBUMIN 2.8* 2.7*   Recent Labs  Lab 02/05/20 1800  LIPASE 28   No results for input(s): AMMONIA in the last 168 hours. Coagulation Profile: No results for input(s): INR, PROTIME in the last 168 hours. Cardiac Enzymes: No results for input(s): CKTOTAL, CKMB, CKMBINDEX, TROPONINI in the last 168 hours. BNP (last 3 results) No results for input(s): PROBNP in the last 8760 hours. HbA1C: No results for input(s): HGBA1C in the last 72 hours. CBG: No results for input(s): GLUCAP in the last 168 hours. Lipid Profile: No results for input(s): CHOL, HDL, LDLCALC, TRIG, CHOLHDL, LDLDIRECT in the last 72 hours. Thyroid Function Tests: Recent Labs    02/05/20 2304  TSH 3.183   Anemia Panel: No results for input(s): VITAMINB12, FOLATE, FERRITIN, TIBC, IRON, RETICCTPCT in the last 72 hours. Sepsis Labs: Recent Labs  Lab 02/05/20 1802 02/05/20 2013 02/05/20 2304 02/06/20 0510  PROCALCITON  --   --  <0.10  --   LATICACIDVEN 2.4* 2.1*  --  1.3    Recent Results (from the past 240 hour(s))  SARS Coronavirus 2 by RT PCR (hospital order, performed in [facility] hospital lab) Nasopharyngeal Nasopharyngeal Swab     Status: None   Collection Time: 02/05/20  6:36 PM   Specimen: Nasopharyngeal Swab  Result Value Ref Range Status   SARS Coronavirus 2 NEGATIVE NEGATIVE Final    Comment: (NOTE) SARS-CoV-2 target nucleic acids are NOT DETECTED. The SARS-CoV-2 RNA is generally detectable in upper and lower respiratory specimens during the acute phase of infection. The lowest concentration of SARS-CoV-2 viral copies this assay can detect is 250 copies / mL. A negative result does not preclude SARS-CoV-2 infection and should not be used as the sole basis for treatment or other patient management decisions.  A negative result may occur with improper specimen collection / handling,  submission of specimen other than nasopharyngeal swab, presence of viral mutation(s) within the areas targeted by this assay, and inadequate number of viral copies (<250 copies / mL). A negative result must be combined with clinical observations, patient history, and epidemiological information. Fact Sheet for Patients:   StrictlyIdeas.no Fact Sheet for Healthcare Providers: BankingDealers.co.za This test is not yet approved or cleared  by the [place] FDA and has been authorized for detection and/or diagnosis of SARS-CoV-2 by FDA under an Emergency Use Authorization (EUA).  This EUA will remain in effect (meaning this test can be used) for the duration of the COVID-19 declaration under Section 564(b)(1) of the Act, 21 U.S.C. section 360bbb-3(b)(1), unless the authorization is terminated or revoked sooner. Performed at [facility], [address] [place], [place] [address]   Culture, blood (routine x 2)     Status: None (Preliminary result)   Collection Time: 02/05/20 11:05 PM   Specimen: BLOOD LEFT HAND  Result Value Ref Range Status  Specimen Description BLOOD LEFT HAND  Final   Special Requests   Final    BOTTLES DRAWN AEROBIC AND ANAEROBIC Blood Culture adequate volume   Culture   Final    NO GROWTH < 12 HOURS Performed at [facility], [address] [place], [place] [address]    Report Status PENDING  Incomplete         Radiology Studies: CT Chest Wo Contrast  Result Date: 02/05/2020 CLINICAL DATA:  Shortness of breath. EXAM: CT CHEST WITHOUT CONTRAST TECHNIQUE: Multidetector CT imaging of the chest was performed following the standard protocol without IV contrast. COMPARISON:  Chest plain film, dated Feb 05, 2020. FINDINGS: Cardiovascular: There is mild calcification of the aortic arch. There is mild to moderate severity cardiomegaly. No pericardial effusion. Mediastinum/Nodes: No enlarged mediastinal or  axillary lymph nodes. Thyroid gland, trachea, and esophagus demonstrate no significant findings. Lungs/Pleura: A 3.7 cm x 2.1 cm area of patchy airspace disease is seen within the lateral aspect of the right upper lobe. Additional 7.3 cm x 3.9 cm patchy area of airspace disease is seen along the anterior aspect of the left hilum. This corresponds to the area of abnormality seen within this region on the prior chest plain film. Small patchy areas of airspace disease are seen bilateral lower lobes and posteromedial aspect of the right upper lobe. There are small bilateral pleural effusions. No pneumothorax is identified. Upper Abdomen: Metallic density surgical coils are seen within the right upper quadrant. Musculoskeletal: Multilevel degenerative changes seen throughout the thoracic spine. IMPRESSION: 1. Patchy bilateral areas of airspace disease, most pronounced within the lateral aspect of the right upper lobe and along the anterior aspect of the left hilum. Follow-up to resolution is recommended, as the presence of an underlying neoplastic process cannot be excluded. 2. Small bilateral pleural effusions. Aortic Atherosclerosis (ICD10-I70.0). Electronically Signed   By: Aram Candela M.D.   On: 02/05/2020 22:05   DG Chest Portable 1 View  Result Date: 02/05/2020 CLINICAL DATA:  Atrial fibrillation with rapid ventricular response EXAM: PORTABLE CHEST 1 VIEW COMPARISON:  06/02/2014 FINDINGS: Two frontal views of the chest demonstrate an enlarged cardiac silhouette with prominent left atrial dilatation. Increased density at the left hilum could reflect left suprahilar consolidation, though underlying mass could be considered. No effusion or pneumothorax. No acute bony abnormalities. IMPRESSION: 1. Left hilar density, differential includes left upper lobe consolidation versus left hilar mass. Follow-up chest CT without IV contrast may be useful when clinical situation permits. 2. Cardiomegaly with enlarged  left atrium, stable. Electronically Signed   By: Sharlet Salina M.D.   On: 02/05/2020 19:13        Scheduled Meds: . enoxaparin (LOVENOX) injection  40 mg Subcutaneous Daily   Continuous Infusions: . diltiazem (CARDIZEM) infusion 7.5 mg/hr (02/06/20 0723)     LOS: 0 days   Time spent= 35 mins    Caili Escalera Joline Maxcy, MD [facility]  If 7PM-7AM, please contact night-coverage  02/06/2020, 9:22 AM

## 2020-02-06 NOTE — Progress Notes (Signed)
Latest bp-81/55 not in any form of distress.

## 2020-02-06 NOTE — Consult Note (Signed)
Cardiology Consultation:   Patient ID: Bryan Moran; [id number]; [birth year]   Admit date: 02/05/2020 Date of Consult: 02/06/2020  Primary Care Provider: Lorene Dy, MD Primary Cardiologist: Dr. Ena Dawley, MD  Primary Electrophysiologist: Dr. Rayann Heman, MD   Patient Profile:   Bryan Moran is a 74 y.o. male with a hx of chronic systolic CHF with last LVEF at 25 to 30% per echo in 2016>>followed by Dr. Rayann Heman and refused Biv ICD, chronic LBBB, hypertensive heart disease with CHF, paroxysmal/persistent atrial fibrillation previously controlled by amiodarone and no anticoagulation secondary to GI bleed who is being seen today for the evaluation of AF with RVR and CHF at the request of Dr. Hal Hope   History of Present Illness:   Bryan Moran is a 54 yoM with a hx as stated above who presented to [facility] on 02/06/20 with a month hx of SOB and diarrhea. He states that he received his COVID vaccines and has been symptomatic since that time. He specifically denies chest pain, fevers, and cough, but has had significant fatigue along with chills, nausea and what sounds like orthopnea symptoms. Given symptom persistence, he proceeded to the ED for further evaluation. ]  In the ED, EKG/telemetry showed AF with RVR, LBBB (known). He was placed on a Diltiazem infusion. Lactic acid was found to be elevated, along with mild hypotension therefore he was given IVF bolus. Creatinine found to be elevated above baseline of 1.1>>>1.7>>now improved to 1.36 after hydration. CXR with left hilar density, differential includes left upper lobe consolidation versus left hilar mass. Chest CT was obtained which showed patchy bilateral areas of airspace disease. Follow-up to resolution is recommended, as the presence of an underlying neoplastic process cannot be excluded. Also with small bilateral pleural effusions. BNP was found to be elevated at 789. hsT 75>>81>>83>>75, flat trend not consistent with ACS.   He has been followed  by Dr. Meda Coffee for his cardiology care (as well as Dr. Rayann Heman) and was last seen in follow up 07/27/2018. Initial echocardiogram from 05/2014 showed an EF of 15-20% at which time he was managed with guideline directed therapy. Repeat echo 11/2014 with mild improvement in function at 25-30% however he was referred to EP for possible BiV ICD placement which he subsequently refused. Systolic dysfunction felt to be tachycardia medicated due to atrial fibrillation. He initially was on Entresto however he had issues with hypotension therefore this was discontinued. He was continued on lisinopril and carvedilol. At last follow up, he was maintaining NSR. He was initially anticoagulated with Eliquis at AF dx however experienced a terrible GI bleed requiring multiple transfusions. He has since been cleared per GI to resume NOAC but the patient declines despite increased risk of stroke. It appears that he was asking about the discontinuation of his amiodarone 200mg  pO QD during an OV in 2019 but this was felt to be necessary given his tachycardia medicated LV dysfunction and deferral of ICD placement. He later self discontinued therapy around 11/2017. He has not been seen in follow up since 06/2018. He also states that he has not been taking spironolactone or carvedilol. He lives alone and reports his only family is his sister in [place].   Past Medical History:  Diagnosis Date  . A-fib ([place])    a. Officially dx 05/27/14, sx may have been going on longer.  . Acute blood loss anemia   . Acute duodenal ulcer with hemorrhage 06/10/2014  . Atrial fibrillation, persistent ([place])   . Coagulopathy ([place])   .  Dysrhythmia   . Eczema   . Hypertension   . Venous insufficiency     Past Surgical History:  Procedure Laterality Date  . CARDIOVERSION N/A 06/05/2014   Procedure: CARDIOVERSION;  Surgeon: Quintella Reichert, MD;  Location: [facility] ENDOSCOPY;  Service: Cardiovascular;  Laterality: N/A;  . ESOPHAGOGASTRODUODENOSCOPY N/A  06/07/2014   Procedure: ESOPHAGOGASTRODUODENOSCOPY (EGD);  Surgeon: Theda Belfast, MD;  Location: [facility] ENDOSCOPY;  Service: Endoscopy;  Laterality: N/A;  . ESOPHAGOGASTRODUODENOSCOPY N/A 06/12/2014   Procedure: ESOPHAGOGASTRODUODENOSCOPY (EGD);  Surgeon: Iva Boop, MD;  Location: [facility] ENDOSCOPY;  Service: Endoscopy;  Laterality: N/A;  . HERNIA REPAIR  1993   [facility]  . TEE WITHOUT CARDIOVERSION N/A 06/05/2014   Procedure: TRANSESOPHAGEAL ECHOCARDIOGRAM (TEE);  Surgeon: Quintella Reichert, MD;  Location: [facility] ENDOSCOPY;  Service: Cardiovascular;  Laterality: N/A;     Prior to Admission medications   Medication Sig Start Date End Date Taking? Authorizing Provider  clotrimazole-betamethasone (LOTRISONE) cream Apply 1 application topically daily.  12/28/19  Yes [provider]  Dermatological Products, Misc. (ELETONE EX) Apply 1 application topically 2 (two) times daily as needed (eczema).   Yes [provider]  furosemide (LASIX) 40 MG tablet Take 1 tablet (40 mg total) by mouth daily as needed (swelling). 02/17/17  Yes Lars Masson, MD  lisinopril (PRINIVIL,ZESTRIL) 40 MG tablet Take 1 tablet (40 mg total) by mouth daily. Please keep upcoming appt for future refills. Thank you Patient taking differently: Take 40 mg by mouth daily.  11/04/17  Yes Lars Masson, MD  triamcinolone cream (KENALOG) 0.1 % SMARTSIG:1 Application Topical 2-3 Times Daily 09/20/19  Yes [provider]  carvedilol (COREG) 25 MG tablet Take 1 tablet (25 mg total) by mouth 2 (two) times daily with a meal. Patient not taking: Reported on 02/05/2020 12/15/17 07/27/18  Lars Masson, MD  pantoprazole (PROTONIX) 40 MG tablet Take 1 tablet (40 mg total) by mouth daily. Patient not taking: Reported on 02/05/2020 06/24/14   Hillis Range, MD  spironolactone (ALDACTONE) 25 MG tablet Take 1 tablet (25 mg total) by mouth daily. Please keep upcoming appt for future refills. Thank you Patient not  taking: Reported on 02/05/2020 11/04/17   Lars Masson, MD    Inpatient Medications: Scheduled Meds: . amiodarone  150 mg Intravenous Once  . apixaban  5 mg Oral BID   Continuous Infusions: . diltiazem (CARDIZEM) infusion 7.5 mg/hr (02/06/20 0723)   PRN Meds: ipratropium-albuterol, ondansetron **OR** ondansetron (ZOFRAN) IV, polyethylene glycol, senna-docusate  Allergies:   No Known Allergies  Social History:   Social History   Socioeconomic History  . Marital status: Single    Spouse name: Not on file  . Number of children: Not on file  . Years of education: Not on file  . Highest education level: Not on file  Occupational History  . Not on file  Tobacco Use  . Smoking status: Never Smoker  . Smokeless tobacco: Never Used  Substance and Sexual Activity  . Alcohol use: No  . Drug use: No  . Sexual activity: Not Currently  Other Topics Concern  . Not on file  Social History Narrative  . Not on file   Social Determinants of Health   Financial Resource Strain:   . Difficulty of Paying Living Expenses:   Food Insecurity:   . Worried About Programme researcher, broadcasting/film/video in the Last Year:   . Barista in the Last Year:   Transportation Needs:   .  Lack of Transportation (Medical):   Marland Kitchen Lack of Transportation (Non-Medical):   Physical Activity:   . Days of Exercise per Week:   . Minutes of Exercise per Session:   Stress:   . Feeling of Stress :   Social Connections:   . Frequency of Communication with Friends and Family:   . Frequency of Social Gatherings with Friends and Family:   . Attends Religious Services:   . Active Member of Clubs or Organizations:   . Attends Banker Meetings:   Marland Kitchen Marital Status:   Intimate Partner Violence:   . Fear of Current or Ex-Partner:   . Emotionally Abused:   Marland Kitchen Physically Abused:   . Sexually Abused:     Family History:   Family History  Problem Relation Age of Onset  . Depression Father   . Heart disease  Father        Some sort of heart issue at end of life - possibly CAD  . Multiple sclerosis Mother   . Cancer Mother        Tumor in kidney, spread to bones   Family Status:  Family Status  Relation Name Status  . Father  Deceased  . Mother  Deceased  . MGM  Deceased  . MGF  Deceased  . PGM  Deceased  . PGF  Deceased    ROS:  Please see the history of present illness.  All other ROS reviewed and negative.     Physical Exam/Data:   Vitals:   02/06/20 0715 02/06/20 0720 02/06/20 0730 02/06/20 0924  BP:    99/72  Pulse: 93 89 82 (!) 103  Resp:    19  Temp:    97.7 F (36.5 C)  TempSrc:    Oral  SpO2: 93% 96% 95% 94%   No intake or output data in the 24 hours ending 02/06/20 1017 There were no vitals filed for this visit. There is no height or weight on file to calculate BMI.   Affect appropriate Somewhat unkept white male  HEENT: normal Neck supple with no adenopathy JVP normal no bruits no thyromegaly Lungs clear with no wheezing and good diaphragmatic motion Heart:  S1/S2 no murmur, no rub, gallop or click PMI normal Abdomen: benighn, BS positve, no tenderness, no AAA no bruit.  No HSM or HJR Distal pulses intact with no bruits No edema Neuro non-focal Skin warm and dry poor nail beds feet  No muscular weakness    EKG:  The EKG was personally reviewed and demonstrates:  02/05/20 Atrial fibrillation with RVR, LBBB (known) and no acute changes  Telemetry:  Telemetry was personally reviewed and demonstrates: 02/06/20 AF with HR in the 90-100's   Relevant CV Studies:  Echocardiogram 11/27/2014: Study Conclusions   - Left ventricle: The cavity size was normal. There was mild  concentric hypertrophy. Systolic function was severely reduced.  The estimated ejection fraction was in the range of 25% to 30%.  Diffuse hypokinesis. Possible hypokinesis of the  mid-apicalanterior and apical myocardium. This may be artifactual  and related to LBBB. Doppler  parameters are consistent with  abnormal left ventricular relaxation (grade 1 diastolic  dysfunction). Indeterminate mean left atrial pressure.  - Ventricular septum: Septal motion showed abnormal function,  dyssynergy, and paradox.  - Aortic valve: There was mild to moderate regurgitation directed  centrally in the LVOT.  - Mitral valve: There was mild regurgitation.  - Left atrium: The atrium was moderately dilated.  - Right atrium: The  atrium was mildly dilated.   Impressions:   - Compared to September 2015, LVEF has improved, but remains  severely depressed. There is profound systolic dyssynchrony.   Echocardiogram 06/03/2014:  Study Conclusions   - Left ventricle: There is profound contractile dyssynchrony, but  no clear regional wall motion abnormality. The cavity size was  mildly dilated. There was moderate concentric hypertrophy.  Systolic function was severely reduced. The estimated ejection  fraction was in the range of 15% to 20%. Diffuse hypokinesis.  Features are consistent with a pseudonormal left ventricular  filling pattern, with concomitant abnormal relaxation and  increased filling pressure (grade 2 diastolic dysfunction). No  evidence of thrombus.  - Ventricular septum: Septal motion showed abnormal function,  dyssynergy, and paradox. These changes are consistent with a left  bundle branch block.  - Aortic valve: There was mild regurgitation.  - Mitral valve: There was moderate regurgitation directed  centrally.  - Left atrium: The atrium was severely dilated.  - Right ventricle: The cavity size was moderately dilated.  - Right atrium: The atrium was moderately to severely dilated.   Laboratory Data:  Chemistry Recent Labs  Lab 02/05/20 1800 02/06/20 0510  NA 143 140  K 4.3 3.8  CL 108 109  CO2 24 20*  GLUCOSE 86 99  BUN 32* 29*  CREATININE 1.73* 1.36*  CALCIUM 8.6* 8.2*  GFRNONAA 39* 52*  GFRAA 45* 60*  ANIONGAP  11 11    Total Protein  Date Value Ref Range Status  02/06/2020 5.0 (L) 6.5 - 8.1 g/dL Final   Albumin  Date Value Ref Range Status  02/06/2020 2.7 (L) 3.5 - 5.0 g/dL Final   AST  Date Value Ref Range Status  02/06/2020 21 15 - 41 U/L Final   ALT  Date Value Ref Range Status  02/06/2020 35 0 - 44 U/L Final   Alkaline Phosphatase  Date Value Ref Range Status  02/06/2020 53 38 - 126 U/L Final   Total Bilirubin  Date Value Ref Range Status  02/06/2020 2.7 (H) 0.3 - 1.2 mg/dL Final   Hematology Recent Labs  Lab 02/05/20 1800 02/06/20 0012  WBC 7.0 7.4  RBC 5.03 4.76  HGB 15.8 15.4  HCT 50.3 46.9  MCV 100.0 98.5  MCH 31.4 32.4  MCHC 31.4 32.8  RDW 15.6* 15.7*  PLT 161 145*   Cardiac EnzymesNo results for input(s): TROPONINI in the last 168 hours. No results for input(s): TROPIPOC in the last 168 hours.  BNP Recent Labs  Lab 02/05/20 1802  BNP 789.9*    DDimer  Recent Labs  Lab 02/06/20 0640  DDIMER 1.92*   TSH:  Lab Results  Component Value Date   TSH 3.183 02/05/2020   Lipids: Lab Results  Component Value Date   CHOL 182 06/02/2016   HDL 44 06/02/2016   LDLCALC 115 06/02/2016   TRIG 115 06/02/2016   CHOLHDL 4.1 06/02/2016   HgbA1c: Lab Results  Component Value Date   HGBA1C 6.2 (H) 06/08/2014    Radiology/Studies:  CT Chest Wo Contrast  Result Date: 02/05/2020 CLINICAL DATA:  Shortness of breath. EXAM: CT CHEST WITHOUT CONTRAST TECHNIQUE: Multidetector CT imaging of the chest was performed following the standard protocol without IV contrast. COMPARISON:  Chest plain film, dated Feb 05, 2020. FINDINGS: Cardiovascular: There is mild calcification of the aortic arch. There is mild to moderate severity cardiomegaly. No pericardial effusion. Mediastinum/Nodes: No enlarged mediastinal or axillary lymph nodes. Thyroid gland, trachea, and esophagus demonstrate no significant  findings. Lungs/Pleura: A 3.7 cm x 2.1 cm area of patchy airspace disease is  seen within the lateral aspect of the right upper lobe. Additional 7.3 cm x 3.9 cm patchy area of airspace disease is seen along the anterior aspect of the left hilum. This corresponds to the area of abnormality seen within this region on the prior chest plain film. Small patchy areas of airspace disease are seen bilateral lower lobes and posteromedial aspect of the right upper lobe. There are small bilateral pleural effusions. No pneumothorax is identified. Upper Abdomen: Metallic density surgical coils are seen within the right upper quadrant. Musculoskeletal: Multilevel degenerative changes seen throughout the thoracic spine. IMPRESSION: 1. Patchy bilateral areas of airspace disease, most pronounced within the lateral aspect of the right upper lobe and along the anterior aspect of the left hilum. Follow-up to resolution is recommended, as the presence of an underlying neoplastic process cannot be excluded. 2. Small bilateral pleural effusions. Aortic Atherosclerosis (ICD10-I70.0). Electronically Signed   By: Aram Candela M.D.   On: 02/05/2020 22:05   DG Chest Portable 1 View  Result Date: 02/05/2020 CLINICAL DATA:  Atrial fibrillation with rapid ventricular response EXAM: PORTABLE CHEST 1 VIEW COMPARISON:  06/02/2014 FINDINGS: Two frontal views of the chest demonstrate an enlarged cardiac silhouette with prominent left atrial dilatation. Increased density at the left hilum could reflect left suprahilar consolidation, though underlying mass could be considered. No effusion or pneumothorax. No acute bony abnormalities. IMPRESSION: 1. Left hilar density, differential includes left upper lobe consolidation versus left hilar mass. Follow-up chest CT without IV contrast may be useful when clinical situation permits. 2. Cardiomegaly with enlarged left atrium, stable. Electronically Signed   By: Sharlet Salina M.D.   On: 02/05/2020 19:13   Assessment and Plan:   1. Paroxysmal atrial fibrillation with RVR:   CHADVASC 4 start coreg for rate control and iv amiodarone Discussed need for restoration of NSR given CHF and low EF. Willing to try eliquis and do TEE/DCC Friday if he does not convert on his own   2. Acute on chronic systolic HF: repeat echo pending EF has been severely reduced in past 25-30% TTE 2016 Mild CHF now Continue lasix see above regarding Rx for PAF he has refused CRT/AICD in past   3. History of GI Bleed with blood loss anemia: Hb 15.4 cleared by GI in past to resume DOAC  4. Moderate AR, mild MR: repeat echo pending this admission    For questions or updates, please contact [facility] Please consult [email or web address] for contact info under Cardiology/STEMI.   Charlton Haws MD [facility]

## 2020-02-06 NOTE — Progress Notes (Signed)
Blood pressure on 60's-70's asymptomatic,  Dr Nelson Chimes made aware. Amiodarone gtt placed on hold . NS 500  for 2 hours started . Pt watching tv with no complaints. Continue to monitor.

## 2020-02-06 NOTE — Progress Notes (Signed)
Admission from the ED by bed awake and alert. Minimal bleeding noted in the penis,claimed to have cut it when using urinal. Site cleansed. No further bleeding noted.

## 2020-02-06 NOTE — Progress Notes (Signed)
  Echocardiogram 2D Echocardiogram has been performed.  Pieter Partridge 02/06/2020, 3:29 PM

## 2020-02-06 NOTE — Progress Notes (Signed)
Patient currently on Amiodarone drip. Currently in A fib with HR 80s but SBP in 70-80s as well. Patient doesn't have any complaints.  For now I have advised RN to pause Amiodarone, bolus NS 500cc over 2 hrs, d/c Coreg. Ordered prn lopressor if it becomes necessary overnight after his BP stabilizes.   If these measures doesn't help, notify Cardiology or on call for [facility] for further management help.   Stephania Fragmin MD [facility]

## 2020-02-06 NOTE — Care Management (Signed)
Per [place] PH# [phone].  Co-pay amount for Eliquis 2.5 mg and 71m. bid for a 30 day supply. 203.83.  Co-pay amount for Xarelto 15 mg. bid and 20 mg. daily $203.83.  No PA required Deductible not met (he has a $200.00 deductible only $41.17 has been met) cnce deductible has been met co-pay for his medication will be $45.00.  Tier 2  Retail Pharmacy [facility].

## 2020-02-07 DIAGNOSIS — I519 Heart disease, unspecified: Secondary | ICD-10-CM

## 2020-02-07 LAB — MAGNESIUM: Magnesium: 2.3 mg/dL (ref 1.7–2.4)

## 2020-02-07 LAB — COMPREHENSIVE METABOLIC PANEL
ALT: 29 U/L (ref 0–44)
AST: 19 U/L (ref 15–41)
Albumin: 2.6 g/dL — ABNORMAL LOW (ref 3.5–5.0)
Alkaline Phosphatase: 50 U/L (ref 38–126)
Anion gap: 10 (ref 5–15)
BUN: 36 mg/dL — ABNORMAL HIGH (ref 8–23)
CO2: 18 mmol/L — ABNORMAL LOW (ref 22–32)
Calcium: 8.1 mg/dL — ABNORMAL LOW (ref 8.9–10.3)
Chloride: 112 mmol/L — ABNORMAL HIGH (ref 98–111)
Creatinine, Ser: 1.9 mg/dL — ABNORMAL HIGH (ref 0.61–1.24)
GFR calc Af Amer: 40 mL/min — ABNORMAL LOW (ref >60)
GFR calc non Af Amer: 34 mL/min — ABNORMAL LOW (ref >60)
Glucose, Bld: 122 mg/dL — ABNORMAL HIGH (ref 70–99)
Potassium: 4.2 mmol/L (ref 3.5–5.1)
Sodium: 140 mmol/L (ref 135–145)
Total Bilirubin: 1.5 mg/dL — ABNORMAL HIGH (ref 0.3–1.2)
Total Protein: 4.9 g/dL — ABNORMAL LOW (ref 6.5–8.1)

## 2020-02-07 LAB — CBC
HCT: 47.9 % (ref 39.0–52.0)
Hemoglobin: 15.3 g/dL (ref 13.0–17.0)
MCH: 31.8 pg (ref 26.0–34.0)
MCHC: 31.9 g/dL (ref 30.0–36.0)
MCV: 99.6 fL (ref 80.0–100.0)
Platelets: 138 10*3/uL — ABNORMAL LOW (ref 150–400)
RBC: 4.81 MIL/uL (ref 4.22–5.81)
RDW: 15.4 % (ref 11.5–15.5)
WBC: 8.4 10*3/uL (ref 4.0–10.5)
nRBC: 0 % (ref 0.0–0.2)

## 2020-02-07 NOTE — Evaluation (Signed)
Physical Therapy Evaluation Patient Details Name: Bryan Moran MRN: [id number] DOB: June 08, 1946 Today's Date: 02/07/2020   History of Present Illness  Pt adm with SOB and weakness and found to have a-fib with rvr. PMH - chf, htn, ckd  Clinical Impression  Pt presents to PT with slightly unsteady gait. Expect he will make good progress back toward his baseline. Recommend HHPT after dc to address any residual deficits. May benefit from use of rollator vs walker at DC to incr gait stability.     Follow Up Recommendations Home health PT;Supervision - Intermittent    Equipment Recommendations  Other (comment)(possible rollator)    Recommendations for Other Services       Precautions / Restrictions Precautions Precautions: Fall      Mobility  Bed Mobility               General bed mobility comments: Pt up on EOB with OT  Transfers Overall transfer level: Needs assistance Equipment used: Rolling walker (2 wheeled);None Transfers: Sit to/from Stand Sit to Stand: Min guard;Min assist         General transfer comment: Verbal cues for hand placement. Min assist to bring hips up from low bed. Min guard to come up from recliner.   Ambulation/Gait Ambulation/Gait assistance: Min guard Gait Distance (Feet): 150 Feet Assistive device: Rolling walker (2 wheeled);None Gait Pattern/deviations: Step-through pattern;Decreased stride length Gait velocity: decr Gait velocity interpretation: 1.31 - 2.62 ft/sec, indicative of limited community ambulator General Gait Details: Slightly unsteady gait but no loss of balance with or without assistive device  Stairs            Wheelchair Mobility    Modified Rankin (Stroke Patients Only)       Balance Overall balance assessment: Needs assistance Sitting-balance support: No upper extremity supported;Feet supported Sitting balance-Leahy Scale: Good     Standing balance support: No upper extremity supported Standing  balance-Leahy Scale: Fair                               Pertinent Vitals/Pain Pain Assessment: No/denies pain    Home Living Family/patient expects to be discharged to:: Private residence Living Arrangements: Alone Available Help at Discharge: Family Type of Home: House Home Access: Stairs to enter   [employer] of Steps: 2 Home Layout: One level;Other (Comment)(has attic and basement but doesnt have to go there) Home Equipment: Cane - single point;Walker - 2 wheels Additional Comments: cane and walker from previous family members    Prior Function Level of Independence: Independent               Hand Dominance        Extremity/Trunk Assessment   Upper Extremity Assessment Upper Extremity Assessment: Defer to OT evaluation    Lower Extremity Assessment Lower Extremity Assessment: Generalized weakness       Communication   Communication: No difficulties  Cognition Arousal/Alertness: Awake/alert Behavior During Therapy: WFL for tasks assessed/performed Overall Cognitive Status: Within Functional Limits for tasks assessed                                        General Comments General comments (skin integrity, edema, etc.): SpO2 >94% on RA with amb    Exercises     Assessment/Plan    PT Assessment Patient needs continued PT services  PT Problem List  Decreased strength;Decreased balance;Decreased mobility;Obesity       PT Treatment Interventions DME instruction;Gait training;Functional mobility training;Therapeutic activities;Therapeutic exercise;Balance training;Patient/family education;Stair training    PT Goals (Current goals can be found in the Care Plan section)  Acute Rehab PT Goals Patient Stated Goal: return home PT Goal Formulation: With patient Time For Goal Achievement: 02/21/20 Potential to Achieve Goals: Good    Frequency Min 3X/week   Barriers to discharge Decreased caregiver support lives  alone    Co-evaluation               AM-PAC PT "6 Clicks" Mobility  Outcome Measure Help needed turning from your back to your side while in a flat bed without using bedrails?: None Help needed moving from lying on your back to sitting on the side of a flat bed without using bedrails?: None Help needed moving to and from a bed to a chair (including a wheelchair)?: A Little Help needed standing up from a chair using your arms (e.g., wheelchair or bedside chair)?: A Little Help needed to walk in hospital room?: A Little Help needed climbing 3-5 steps with a railing? : A Little 6 Click Score: 20    End of Session Equipment Utilized During Treatment: Gait belt Activity Tolerance: Patient tolerated treatment well Patient left: in chair;with call bell/phone within reach Nurse Communication: Mobility status PT Visit Diagnosis: Unsteadiness on feet (R26.81);Muscle weakness (generalized) (M62.81)    Time: 7673-4193 PT Time Calculation (min) (ACUTE ONLY): 16 min   Charges:   PT Evaluation $PT Eval Moderate Complexity: [place] Pager [phone] Office [place] 02/07/2020, 1:45 PM

## 2020-02-07 NOTE — Progress Notes (Signed)
Md made aware about the small cut in the penis sustained when he used the urinal in the ED yesterday. No order given.

## 2020-02-07 NOTE — Progress Notes (Addendum)
Progress Note  Patient Name: Bryan Moran Date of Encounter: 02/07/2020  Primary Cardiologist: Ena Dawley, MD   Subjective   Patient remains in Afib RVR, rates 110-120. No chest pain. On 2L O2.  Amiodarone and beta blocker were d/c due to low BP over night   Inpatient Medications    Scheduled Meds:  apixaban  5 mg Oral BID   clotrimazole-betamethasone  1 application Topical Daily   furosemide  40 mg Oral BID   Continuous Infusions:  amiodarone 30 mg/hr (02/07/20 0814)   PRN Meds: ipratropium-albuterol, metoprolol tartrate, ondansetron **OR** ondansetron (ZOFRAN) IV, polyethylene glycol, senna-docusate, white petrolatum   Vital Signs    Vitals:   02/06/20 2130 02/06/20 2200 02/06/20 2300 02/07/20 0717  BP: (!) 83/67 (!) 88/70 (!) 84/61 100/80  Pulse:  81 92 63  Resp:    (!) 21  Temp:      TempSrc:      SpO2:    96%  Weight:      Height:        Intake/Output Summary (Last 24 hours) at 02/07/2020 0847 Last data filed at 02/07/2020 0535 Gross per 24 hour  Intake 103.68 ml  Output 900 ml  Net -796.32 ml   Last 3 Weights 02/06/2020 07/27/2018 12/15/2017  Weight (lbs) 245 lb 9.5 oz 247 lb 250 lb 12.8 oz  Weight (kg) 111.4 kg 112.038 kg 113.762 kg      Telemetry    Afib HR 110-120, PVCs - Personally Reviewed  ECG    Afib RVR with chronic LBBB, HR 126 on admission - Personally Reviewed  Physical Exam   GEN: No acute distress.   Neck: + JVD Cardiac: Irreg Irreg, no murmurs, rubs, or gallops.  Respiratory: Clear to auscultation bilaterally. GI: Soft, nontender, non-distended  MS: Mild lower leg edema; No deformity. Neuro:  Nonfocal  Psych: Normal affect   Labs    High Sensitivity Troponin:   Recent Labs  Lab 02/05/20 1800 02/05/20 2013 02/05/20 2304 02/06/20 0510  TROPONINIHS 75* 81* 83* 75*      Chemistry Recent Labs  Lab 02/05/20 1800 02/06/20 0510 02/07/20 0217  NA 143 140 140  K 4.3 3.8 4.2  CL 108 109 112*  CO2 24 20* 18*  GLUCOSE  86 99 122*  BUN 32* 29* 36*  CREATININE 1.73* 1.36* 1.90*  CALCIUM 8.6* 8.2* 8.1*  PROT 5.1* 5.0* 4.9*  ALBUMIN 2.8* 2.7* 2.6*  AST 28 21 19   ALT 41 35 29  ALKPHOS 52 53 50  BILITOT 2.0* 2.7* 1.5*  GFRNONAA 39* 52* 34*  GFRAA 45* 60* 40*  ANIONGAP 11 11 10      Hematology Recent Labs  Lab 02/05/20 1800 02/06/20 0012 02/07/20 0217  WBC 7.0 7.4 8.4  RBC 5.03 4.76 4.81  HGB 15.8 15.4 15.3  HCT 50.3 46.9 47.9  MCV 100.0 98.5 99.6  MCH 31.4 32.4 31.8  MCHC 31.4 32.8 31.9  RDW 15.6* 15.7* 15.4  PLT 161 145* 138*    BNP Recent Labs  Lab 02/05/20 1802  BNP 789.9*     DDimer  Recent Labs  Lab 02/06/20 0640  DDIMER 1.92*     Radiology    CT Chest Wo Contrast  Result Date: 02/05/2020 CLINICAL DATA:  Shortness of breath. EXAM: CT CHEST WITHOUT CONTRAST TECHNIQUE: Multidetector CT imaging of the chest was performed following the standard protocol without IV contrast. COMPARISON:  Chest plain film, dated Feb 05, 2020. FINDINGS: Cardiovascular: There is mild calcification of the  aortic arch. There is mild to moderate severity cardiomegaly. No pericardial effusion. Mediastinum/Nodes: No enlarged mediastinal or axillary lymph nodes. Thyroid gland, trachea, and esophagus demonstrate no significant findings. Lungs/Pleura: A 3.7 cm x 2.1 cm area of patchy airspace disease is seen within the lateral aspect of the right upper lobe. Additional 7.3 cm x 3.9 cm patchy area of airspace disease is seen along the anterior aspect of the left hilum. This corresponds to the area of abnormality seen within this region on the prior chest plain film. Small patchy areas of airspace disease are seen bilateral lower lobes and posteromedial aspect of the right upper lobe. There are small bilateral pleural effusions. No pneumothorax is identified. Upper Abdomen: Metallic density surgical coils are seen within the right upper quadrant. Musculoskeletal: Multilevel degenerative changes seen throughout the  thoracic spine. IMPRESSION: 1. Patchy bilateral areas of airspace disease, most pronounced within the lateral aspect of the right upper lobe and along the anterior aspect of the left hilum. Follow-up to resolution is recommended, as the presence of an underlying neoplastic process cannot be excluded. 2. Small bilateral pleural effusions. Aortic Atherosclerosis (ICD10-I70.0). Electronically Signed   By: Aram Candela M.D.   On: 02/05/2020 22:05   DG Chest Portable 1 View  Result Date: 02/05/2020 CLINICAL DATA:  Atrial fibrillation with rapid ventricular response EXAM: PORTABLE CHEST 1 VIEW COMPARISON:  06/02/2014 FINDINGS: Two frontal views of the chest demonstrate an enlarged cardiac silhouette with prominent left atrial dilatation. Increased density at the left hilum could reflect left suprahilar consolidation, though underlying mass could be considered. No effusion or pneumothorax. No acute bony abnormalities. IMPRESSION: 1. Left hilar density, differential includes left upper lobe consolidation versus left hilar mass. Follow-up chest CT without IV contrast may be useful when clinical situation permits. 2. Cardiomegaly with enlarged left atrium, stable. Electronically Signed   By: Sharlet Salina M.D.   On: 02/05/2020 19:13   ECHOCARDIOGRAM COMPLETE  Result Date: 02/06/2020    ECHOCARDIOGRAM REPORT   Patient Name:   Bryan Moran   Date of Exam: 02/06/2020 Medical Rec #:  [id number]  Height:       75.0 in Accession #:    [id number] Weight:       247.0 lb Date of Birth:  Jan 30, 1946 BSA:          2.400 m Patient Age:    73 years   BP:           101/81 mmHg Patient Gender: M          HR:           99 bpm. Exam Location:  Inpatient Procedure: 2D Echo, Cardiac Doppler and Color Doppler Indications:    Atrial fibrillation  History:        Patient has prior history of Echocardiogram examinations, most                 recent 11/27/2014. CHF, Arrythmias:Atrial Fibrillation and LBBB,                  Signs/Symptoms:Shortness of Breath; Risk Factors:Hypertension.  Sonographer:    Lavenia Atlas Referring Phys: [id number] ARSHAD N KAKRAKANDY IMPRESSIONS  1. Left ventricular ejection fraction, by estimation, is 20 to 25%. The left ventricle has severely decreased function. The left ventricle demonstrates global hypokinesis with significant septal dyssynchrony. The left ventricular internal cavity size was severely dilated. There is mild concentric left ventricular hypertrophy.  2. Right ventricular systolic function is moderately reduced. The right ventricular size  is moderately enlarged. There is normal pulmonary artery systolic pressure. The estimated right ventricular systolic pressure is 26.2 mmHg.  3. Left atrial size was severely dilated.  4. The mitral valve is normal in structure. Mild mitral valve regurgitation. No evidence of mitral stenosis.  5. The aortic valve is normal in structure. Aortic valve regurgitation is mild. No aortic stenosis is present.  6. The inferior vena cava is normal in size with greater than 50% respiratory variability, suggesting right atrial pressure of 3 mmHg.  7. Right atrial size was mildly dilated. FINDINGS  Left Ventricle: Left ventricular ejection fraction, by estimation, is <20%. The left ventricle has severely decreased function. The left ventricle demonstrates global hypokinesis. The left ventricular internal cavity size was severely dilated. There is mild concentric left ventricular hypertrophy. Abnormal (paradoxical) septal motion, consistent with left bundle branch block. Left ventricular diastolic parameters are consistent with Grade II diastolic dysfunction (pseudonormalization). Elevated left atrial pressure. Right Ventricle: The right ventricular size is moderately enlarged. No increase in right ventricular wall thickness. Right ventricular systolic function is moderately reduced. There is normal pulmonary artery systolic pressure. The tricuspid regurgitant velocity  is 2.41 m/s, and with an assumed right atrial pressure of 3 mmHg, the estimated right ventricular systolic pressure is 26.2 mmHg. Left Atrium: Left atrial size was severely dilated. Right Atrium: Right atrial size was mildly dilated. Pericardium: Trivial pericardial effusion is present. There is no evidence of cardiac tamponade. Mitral Valve: The mitral valve is normal in structure. Normal mobility of the mitral valve leaflets. Mild mitral valve regurgitation, with centrally-directed jet. No evidence of mitral valve stenosis. Tricuspid Valve: The tricuspid valve is normal in structure. Tricuspid valve regurgitation is mild . No evidence of tricuspid stenosis. Aortic Valve: The aortic valve is normal in structure. Aortic valve regurgitation is mild. No aortic stenosis is present. Pulmonic Valve: The pulmonic valve was normal in structure. Pulmonic valve regurgitation is not visualized. No evidence of pulmonic stenosis. Aorta: The aortic root is normal in size and structure. Venous: The inferior vena cava is normal in size with greater than 50% respiratory variability, suggesting right atrial pressure of 3 mmHg. IAS/Shunts: No atrial level shunt detected by color flow Doppler.  LEFT VENTRICLE PLAX 2D LVIDd:         7.20 cm  Diastology LVIDs:         6.50 cm  LV e' lateral:   4.24 cm/s LV PW:         1.10 cm  LV E/e' lateral: 19.0 LV IVS:        1.10 cm  LV e' medial:    4.79 cm/s LVOT diam:     2.70 cm  LV E/e' medial:  16.8 LV SV:         64 LV SV Index:   26 LVOT Area:     5.73 cm  RIGHT VENTRICLE RV Basal diam:  4.20 cm RV S prime:     9.36 cm/s TAPSE (M-mode): 2.5 cm LEFT ATRIUM             Index       RIGHT ATRIUM           Index LA diam:        5.60 cm 2.33 cm/m  RA Area:     19.40 cm LA Vol (A2C):   71.5 ml 29.79 ml/m RA Volume:   48.20 ml  20.08 ml/m LA Vol (A4C):   89.1 ml 37.12 ml/m LA Biplane Vol: 88.0  ml 36.66 ml/m  AORTIC VALVE LVOT Vmax:   69.20 cm/s LVOT Vmean:  43.300 cm/s LVOT VTI:    0.111 m   AORTA Ao Root diam: 3.30 cm MITRAL VALVE               TRICUSPID VALVE MV Area (PHT): 4.80 cm    TR Peak grad:   23.2 mmHg MV Decel Time: 158 msec    TR Vmax:        241.00 cm/s MV E velocity: 80.60 cm/s MV A velocity: 47.60 cm/s  SHUNTS MV E/A ratio:  1.69        Systemic VTI:  0.11 m                            Systemic Diam: 2.70 cm Tobias Alexander MD Electronically signed by Tobias Alexander MD Signature Date/Time: 02/06/2020/4:14:29 PM    Final     Cardiac Studies   Echo 02/06/20  1. Left ventricular ejection fraction, by estimation, is 20 to 25%. The  left ventricle has severely decreased function. The left ventricle  demonstrates global hypokinesis with significant septal dyssynchrony. The  left ventricular internal cavity size  was severely dilated. There is mild concentric left ventricular  hypertrophy.   2. Right ventricular systolic function is moderately reduced. The right  ventricular size is moderately enlarged. There is normal pulmonary artery  systolic pressure. The estimated right ventricular systolic pressure is  26.2 mmHg.   3. Left atrial size was severely dilated.   4. The mitral valve is normal in structure. Mild mitral valve  regurgitation. No evidence of mitral stenosis.   5. The aortic valve is normal in structure. Aortic valve regurgitation is  mild. No aortic stenosis is present.   6. The inferior vena cava is normal in size with greater than 50%  respiratory variability, suggesting right atrial pressure of 3 mmHg.   7. Right atrial size was mildly dilated.   Patient Profile     74 y.o. male with a hx of chronic systolic CHF with last LVEF at 25 to 30% per echo in 2016>>followed by Dr. Johney Frame and refused Biv ICD, chronic LBBB, hypertensive heart disease with CHF, paroxysmal/persistent atrial fibrillation previously controlled by amiodarone and no anticoagulation secondary to GI bleed who is being seen today for the evaluation of AF with RVR and CHF.   Assessment &  Plan   Paroxysmal Afib with RVR - previous noncompliance with medications - CHADSVASC 4 - coreg started for rate control - IV amiodarone started - Eliquis 5mg  BID. Patient has previous GIB, continue to monitor daily labs - TSH normal - Patient remains in rapid Afib - Plan for TEE/DCCV tomorrow  Acute on chronic systolic HF - EF 87-56% in 2016 - repeat echo showed EF 20-25% global hypokinesis with significant septal dyssynchrony - Oral lasix 40 mg BID - put out urine - creatinine 1.26>1.90. Will continue laisx for now given mild edema on exam and firm abdomen. Might require IV lasix  Mild MR/mild AR - per echo  AKI.CKD stage 3 - 1.90 this AM - lisinopril held  HTN - lisinopril held for AKI - pressures soft this AM, yesterday pressures were as low as systolics in the 60s. Most recent was 100/80 - If pressures tolerate can consider starting Entresto for low EF  For questions or updates, please contact [facility] Please consult [email or web address] for contact info under  Signed, Cadence David Stall, PA-C  02/07/2020, 8:47 AM    Patient seems to have poor insight into CHF diagnosis. Lungs better as is edema. Discussed with nurse not to hold amiodarone for low BP Will restart with no bolus at 30 mg/hr. He is unlikely to hold NSR without it. Discussed TEE/DCC again with patient scheduled with Dr Duke Salvia Tomorrow will have at least 3-4 doses of eliquis by then May need Advanced CHF consult if unable to get back in NSR  Charlton Haws MD [facility]

## 2020-02-07 NOTE — TOC Initial Note (Addendum)
Transition of Care [facility]) - Initial/Assessment Note    Patient Details  Name: Bryan Moran MRN: [id number] Date of Birth: 31-Jul-1946  Transition of Care [facility]) CM/SW Contact:    Leone Haven, RN Phone Number: 02/07/2020, 4:50 PM  Clinical Narrative:                 NCM offered choice for HHPT, he states [facility] will be alright, NCM made referral to [place] , she is able to take referral, soc will begin 24 to 48 hrs post dc.  He states he has a walker at home and does not want a rollator.   Expected Discharge Plan: Home w Home Health Services Barriers to Discharge: Continued Medical Work up   Patient Goals and CMS Choice Patient states their goals for this hospitalization and ongoing recovery are:: get better CMS Medicare.gov Compare Post Acute Care list provided to:: Patient Choice offered to / list presented to : Patient  Expected Discharge Plan and Services Expected Discharge Plan: Home w Home Health Services   Discharge Planning Services: CM Consult Post Acute Care Choice: Home Health Living arrangements for the past 2 months: Single Family Home                   DME Agency: NA       HH Arranged: PT HH Agency: [facility] Date HH Agency Contacted: 02/07/20 Time HH Agency Contacted: 1650 Representative spoke with at [facility] Agency: [place]  Prior Living Arrangements/Services Living arrangements for the past 2 months: Single Family Home Lives with:: Self Patient language and need for interpreter reviewed:: Yes Do you feel safe going back to the place where you live?: Yes      Need for Family Participation in Patient Care: No (Comment) Care giver support system in place?: No (comment)   Criminal Activity/Legal Involvement Pertinent to Current Situation/Hospitalization: No - Comment as needed  Activities of Daily Living Home Assistive Devices/Equipment: Eyeglasses ADL Screening (condition at time of admission) Patient's cognitive ability adequate to safely  complete daily activities?: Yes Is the patient deaf or have difficulty hearing?: No Does the patient have difficulty seeing, even when wearing glasses/contacts?: No Does the patient have difficulty concentrating, remembering, or making decisions?: No Patient able to express need for assistance with ADLs?: Yes Does the patient have difficulty dressing or bathing?: No Independently performs ADLs?: Yes (appropriate for developmental age) Does the patient have difficulty walking or climbing stairs?: Yes Weakness of Legs: Both Weakness of Arms/Hands: None  Permission Sought/Granted                  Emotional Assessment Appearance:: Appears stated age Attitude/Demeanor/Rapport: Engaged Affect (typically observed): Appropriate Orientation: : Oriented to Self, Oriented to Place, Oriented to  Time, Oriented to Situation Alcohol / Substance Use: Not Applicable Psych Involvement: No (comment)  Admission diagnosis:  Dehydration [E86.0] AKI (acute kidney injury) (HCC) [N17.9] Atrial fibrillation with RVR (HCC) [I48.91] Diarrhea, unspecified type [R19.7] Acute on chronic congestive heart failure, unspecified heart failure type (HCC) [I50.9] Acute respiratory failure with hypoxia (HCC) [J96.01] Patient Active Problem List   Diagnosis Date Noted  . Acute respiratory failure with hypoxia (HCC) 02/06/2020  . Atrial fibrillation with RVR (HCC) 02/05/2020  . Diarrhea 02/05/2020  . AKI (acute kidney injury) (HCC) 02/05/2020  . HTN (hypertension) 05/14/2015  . Chronic systolic CHF (congestive heart failure), NYHA class 2 (HCC) 10/29/2014  . Hypertensive cardiovascular disease 10/24/2014  . Chronic systolic dysfunction of left ventricle 07/25/2014  .  Venous insufficiency 07/25/2014  . Acute duodenal ulcer with hemorrhage 06/10/2014  . Acute blood loss anemia 06/10/2014  . Hematemesis 06/07/2014  . Coagulopathy ([place]) 06/07/2014  . Atrial fibrillation ([place]) 06/05/2014  . Persistent atrial  fibrillation ([place]) 06/02/2014   PCP:  Lorene Dy, MD Pharmacy:   [place], [place] - [address] [place] [address] Phone: [phone] Fax: [phone]  [facility] [place] [employer] [address] [place] [address] Phone: [phone] Fax: [phone]     Social Determinants of Health (SDOH) Interventions    Readmission Risk Interventions No flowsheet data found.

## 2020-02-07 NOTE — Plan of Care (Signed)
  Problem: Education: Goal: Knowledge of General Education information will improve Description: Including pain rating scale, medication(s)/side effects and non-pharmacologic comfort measures Outcome: Progressing   Problem: Clinical Measurements: Goal: Ability to maintain clinical measurements within normal limits will improve Outcome: Progressing Goal: Will remain free from infection Outcome: Progressing Goal: Respiratory complications will improve Outcome: Progressing   Problem: Activity: Goal: Risk for activity intolerance will decrease Outcome: Progressing   Problem: Nutrition: Goal: Adequate nutrition will be maintained Outcome: Progressing   Problem: Coping: Goal: Level of anxiety will decrease Outcome: Progressing   Problem: Pain Managment: Goal: General experience of comfort will improve Outcome: Progressing   Problem: Safety: Goal: Ability to remain free from injury will improve Outcome: Progressing   

## 2020-02-07 NOTE — Progress Notes (Signed)
PROGRESS NOTE    Bryan Moran  PPJ:[id number] DOB: [birth year] DOA: 02/05/2020 PCP: Burton Apley, MD    Brief Narrative:  Unknown Schleyer is a 74 year old Caucasian male with past medical history remarkable for chronic combined systolic/diastolic congestive heart failure last EF 25-30% with previous refusal of ICD, chronic left bundle branch block, essential hypertension, paroxysmal/persistent atrial fibrillation CKD stage III who presented with progressive shortness of breath, weakness.  In the ER patient was in A. fib with RVR blood pressure was fluctuating between normal to very high and was started on Cardizem infusion.  Due to mildly elevated lactate and low normal blood pressure final cc normal saline bolus was given for which blood pressure has remained stable.  Patient's creatinine has increased from 1.1 about 3 years ago to 1.70.  LFTs are normal abdomen appears benign.  Chest x-ray showing features concerning for left hilar mass with upper lobe density for which CT chest was recommended and CT chest showed bilateral hilar airspace disease for which follow-up was recommended.  Patient is afebrile denies any productive cough fever or chills.  No definite signs of any pneumonia.  BNP was elevated at 789 high sensitive troponin was 75-81 lactic acid was 2.4 going improved to 2.1 CBC was unremarkable.  Mild lower extremity edema.  Stool studies have been ordered.  Patient admitted for A. fib with RVR with shortness of breath which likely could be from CHF.    Assessment & Plan:   Active Problems:   Chronic systolic dysfunction of left ventricle   HTN (hypertension)   Atrial fibrillation with RVR (HCC)   Diarrhea   AKI (acute kidney injury) (HCC)   Acute respiratory failure with hypoxia (HCC)   Acute hypoxic respiratory failure Acute on chronic decompensated combined systolic/diastolic CHF exacerbation Patient presenting with progressive shortness of breath, history of medical noncompliance.   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  BNP elevated 789.  TTE with EF 20-25%, severely decreased LV function with global hypokinesis and septal dyssynchrony, severely dilated LV, RV function mildly reduced, LA severely dilated with normal IVC and grade 2 diastolic dysfunction. --Cardiology following, appreciate assistance --Initially started on Coreg drip but was discontinued secondary to hypotension on 5/12 --Continue furosemide 40 mg p.o. twice daily; watch renal function cautiously as now creatinine increased to 1.90 today --Strict I's and O's and daily weights --Continue supplemental oxygen, maintain SPO2 greater than 92%; on 2 LNC today  Paroxysmal/permanent atrial fibrillation with rapid ventricular response Etiology likely secondary to noncompliance with medication regimen.  Previous was on amiodarone and he has stopped for some time now with unclear reason. --Continue amiodarone drip --Coreg/Cardizem was discontinued 5/12 secondary to hypotension --Continue Eliquis for anticoagulation --Cardiology plans TEE/DCCV on 02/07/2020 --Continue monitor on telemetry  Acute on chronic kidney disease stage IIIa Baseline creatinine 1.3 --Cr 1.73-->1.36-->1.90.  --Patient's home lisinopril discontinued --Continues on furosemide 40 mg p.o. twice daily --Continue to monitor renal function closely with diuresis  Generalized weakness, Deconditioning Patient currently lives at home alone, no significant family support close as sister lives 90 miles away in [place]. --PT/OT consult: Pending  Patchy airspace disease, right upper lobe/anterior left hilum Incidental finding on CT chest.  Will need follow-up CT chest in 2-3 months to ensure resolution as underlying neoplastic process cannot be excluded at this time.   DVT prophylaxis: Eliquis Code Status: Full code Family Communication: No family present at  bedside  Disposition Plan:  Status is: Inpatient  Remains inpatient appropriate because:Hemodynamically unstable, Ongoing diagnostic testing needed not appropriate for outpatient work up, Unsafe d/c plan and IV treatments appropriate due to intensity of illness or inability to take PO pending T EE/DCCV, heart rate still not controlled, continues on IV amiodarone, patient hypotensive   Dispo: The patient is from: Home              Anticipated d/c is to: To be determined, home versus SNF              Anticipated d/c date is: 3 days              Patient currently is not medically stable to d/c.   Consultants:   Cardiology  Procedures:   None  Antimicrobials:   None   Subjective: Patient seen and examined at bedside, nursing present.  Continues with mild shortness of breath, slightly improved.  Continues require supplemental oxygen, now on 2 L nasal cannula.  Also reports generalized weakness/fatigue.  No other specific complaints this morning.  Denies headache, no visual changes, no chest pain, no palpitations, no abdominal pain, no cough/congestion, no nausea/vomiting/diarrhea, no fever/chills/night sweats.  Nursing with concerns of hypotension overnight, otherwise no other acute events overnight per nursing staff.  Objective: Vitals:   02/06/20 2130 02/06/20 2200 02/06/20 2300 02/07/20 0717  BP: (!) 83/67 (!) 88/70 (!) 84/61 100/80  Pulse:  81 92 63  Resp:    (!) 21  Temp:      TempSrc:      SpO2:    96%  Weight:      Height:        Intake/Output Summary (Last 24 hours) at 02/07/2020 0946 Last data filed at 02/07/2020 0535 Gross per 24 hour  Intake 103.68 ml  Output 900 ml  Net -796.32 ml   Filed Weights   02/06/20 1630  Weight: 111.4 kg    Examination:  General exam: Appears calm and comfortable  Respiratory system: Clear to auscultation. Respiratory effort normal. On 2L [place] oxygenating 98% Cardiovascular system: Tachycardic, irregularly irregular rhythm. No  JVD, murmurs, rubs, gallops or clicks.  1+ pitting edema bilateral lower extremities, right greater than left Gastrointestinal system: Abdomen is nondistended, soft and nontender. No organomegaly or masses felt. Normal bowel sounds heard. Central nervous system: Alert and oriented. No focal neurological deficits. Extremities: Symmetric 5 x 5 power. Skin: No rashes, lesions or ulcers Psychiatry: Judgement and insight appear poor. Mood & affect appropriate.     Data Reviewed: I have personally reviewed following labs and imaging studies  CBC: Recent Labs  Lab 02/05/20 1800 02/06/20 0012 02/07/20 0217  WBC 7.0 7.4 8.4  HGB 15.8 15.4 15.3  HCT 50.3 46.9 47.9  MCV 100.0 98.5 99.6  PLT 161 145* 138*   Basic Metabolic Panel: Recent Labs  Lab 02/05/20 1800 02/06/20 0510 02/07/20 0217  NA 143 140 140  K 4.3 3.8 4.2  CL 108 109 112*  CO2 24 20* 18*  GLUCOSE 86 99 122*  BUN 32* 29* 36*  CREATININE 1.73* 1.36* 1.90*  CALCIUM 8.6* 8.2* 8.1*  MG 2.5*  --  2.3   GFR: Estimated Creatinine Clearance: 47.4 mL/min (A) (by C-G formula based on SCr of 1.9 mg/dL (H)). Liver Function Tests: Recent Labs  Lab 02/05/20 1800 02/06/20 0510 02/07/20 0217  AST 28 21 19   ALT 41 35 29  ALKPHOS 52 53 50  BILITOT 2.0* 2.7* 1.5*  PROT 5.1* 5.0* 4.9*  ALBUMIN 2.8*  2.7* 2.6*   Recent Labs  Lab 02/05/20 1800  LIPASE 28   No results for input(s): AMMONIA in the last 168 hours. Coagulation Profile: No results for input(s): INR, PROTIME in the last 168 hours. Cardiac Enzymes: No results for input(s): CKTOTAL, CKMB, CKMBINDEX, TROPONINI in the last 168 hours. BNP (last 3 results) No results for input(s): PROBNP in the last 8760 hours. HbA1C: No results for input(s): HGBA1C in the last 72 hours. CBG: No results for input(s): GLUCAP in the last 168 hours. Lipid Profile: No results for input(s): CHOL, HDL, LDLCALC, TRIG, CHOLHDL, LDLDIRECT in the last 72 hours. Thyroid Function  Tests: Recent Labs    02/05/20 2304  TSH 3.183   Anemia Panel: No results for input(s): VITAMINB12, FOLATE, FERRITIN, TIBC, IRON, RETICCTPCT in the last 72 hours. Sepsis Labs: Recent Labs  Lab 02/05/20 1802 02/05/20 2013 02/05/20 2304 02/06/20 0510  PROCALCITON  --   --  <0.10  --   LATICACIDVEN 2.4* 2.1*  --  1.3    Recent Results (from the past 240 hour(s))  SARS Coronavirus 2 by RT PCR (hospital order, performed in [facility] hospital lab) Nasopharyngeal Nasopharyngeal Swab     Status: None   Collection Time: 02/05/20  6:36 PM   Specimen: Nasopharyngeal Swab  Result Value Ref Range Status   SARS Coronavirus 2 NEGATIVE NEGATIVE Final    Comment: (NOTE) SARS-CoV-2 target nucleic acids are NOT DETECTED. The SARS-CoV-2 RNA is generally detectable in upper and lower respiratory specimens during the acute phase of infection. The lowest concentration of SARS-CoV-2 viral copies this assay can detect is 250 copies / mL. A negative result does not preclude SARS-CoV-2 infection and should not be used as the sole basis for treatment or other patient management decisions.  A negative result may occur with improper specimen collection / handling, submission of specimen other than nasopharyngeal swab, presence of viral mutation(s) within the areas targeted by this assay, and inadequate number of viral copies (<250 copies / mL). A negative result must be combined with clinical observations, patient history, and epidemiological information. Fact Sheet for Patients:   BoilerBrush.com.cy Fact Sheet for Healthcare Providers: https://pope.com/ This test is not yet approved or cleared  by the [place] FDA and has been authorized for detection and/or diagnosis of SARS-CoV-2 by FDA under an Emergency Use Authorization (EUA).  This EUA will remain in effect (meaning this test can be used) for the duration of the COVID-19 declaration under  Section 564(b)(1) of the Act, 21 U.S.C. section 360bbb-3(b)(1), unless the authorization is terminated or revoked sooner. Performed at [facility], [address], [place], [place] [address]   Culture, blood (routine x 2)     Status: None (Preliminary result)   Collection Time: 02/05/20 11:05 PM   Specimen: BLOOD LEFT HAND  Result Value Ref Range Status   Specimen Description BLOOD LEFT HAND  Final   Special Requests   Final    BOTTLES DRAWN AEROBIC AND ANAEROBIC Blood Culture adequate volume   Culture   Final    NO GROWTH < 12 HOURS Performed at [facility], [address], [place], [place] [address]    Report Status PENDING  Incomplete  Urine culture     Status: None   Collection Time: 02/06/20  1:48 AM   Specimen: Urine, Random  Result Value Ref Range Status   Specimen Description URINE, RANDOM  Final   Special Requests NONE  Final   Culture  Final    NO GROWTH Performed at [facility], [address] [place], [place] [address]    Report Status 02/06/2020 FINAL  Final  MRSA PCR Screening     Status: None   Collection Time: 02/06/20  3:47 PM   Specimen: Nasopharyngeal  Result Value Ref Range Status   MRSA by PCR NEGATIVE NEGATIVE Final    Comment:        The GeneXpert MRSA Assay (FDA approved for NASAL specimens only), is one component of a comprehensive MRSA colonization surveillance program. It is not intended to diagnose MRSA infection nor to guide or monitor treatment for MRSA infections. Performed at [facility], [address], [place], [place] [address]   C Difficile Quick Screen w PCR reflex     Status: None   Collection Time: 02/06/20  5:53 PM   Specimen: STOOL  Result Value Ref Range Status   C Diff antigen NEGATIVE NEGATIVE Final   C Diff toxin NEGATIVE NEGATIVE Final   C Diff interpretation No C. difficile detected.  Final    Comment: Performed at [facility], [address], [place], [place] [address]          Radiology Studies: CT Chest Wo Contrast  Result Date: 02/05/2020 CLINICAL DATA:  Shortness of breath. EXAM: CT CHEST WITHOUT CONTRAST TECHNIQUE: Multidetector CT imaging of the chest was performed following the standard protocol without IV contrast. COMPARISON:  Chest plain film, dated Feb 05, 2020. FINDINGS: Cardiovascular: There is mild calcification of the aortic arch. There is mild to moderate severity cardiomegaly. No pericardial effusion. Mediastinum/Nodes: No enlarged mediastinal or axillary lymph nodes. Thyroid gland, trachea, and esophagus demonstrate no significant findings. Lungs/Pleura: A 3.7 cm x 2.1 cm area of patchy airspace disease is seen within the lateral aspect of the right upper lobe. Additional 7.3 cm x 3.9 cm patchy area of airspace disease is seen along the anterior aspect of the left hilum. This corresponds to the area of abnormality seen within this region on the prior chest plain film. Small patchy areas of airspace disease are seen bilateral lower lobes and posteromedial aspect of the right upper lobe. There are small bilateral pleural effusions. No pneumothorax is identified. Upper Abdomen: Metallic density surgical coils are seen within the right upper quadrant. Musculoskeletal: Multilevel degenerative changes seen throughout the thoracic spine. IMPRESSION: 1. Patchy bilateral areas of airspace disease, most pronounced within the lateral aspect of the right upper lobe and along the anterior aspect of the left hilum. Follow-up to resolution is recommended, as the presence of an underlying neoplastic process cannot be excluded. 2. Small bilateral pleural effusions. Aortic Atherosclerosis (ICD10-I70.0). Electronically Signed   By: Aram Candela M.D.   On: 02/05/2020 22:05   DG Chest Portable 1 View  Result Date: 02/05/2020 CLINICAL DATA:  Atrial fibrillation with rapid ventricular response EXAM: PORTABLE CHEST 1 VIEW COMPARISON:  06/02/2014 FINDINGS: Two frontal  views of the chest demonstrate an enlarged cardiac silhouette with prominent left atrial dilatation. Increased density at the left hilum could reflect left suprahilar consolidation, though underlying mass could be considered. No effusion or pneumothorax. No acute bony abnormalities. IMPRESSION: 1. Left hilar density, differential includes left upper lobe consolidation versus left hilar mass. Follow-up chest CT without IV contrast may be useful when clinical situation permits. 2. Cardiomegaly with enlarged left atrium, stable. Electronically Signed   By: Sharlet Salina M.D.   On: 02/05/2020 19:13   ECHOCARDIOGRAM COMPLETE  Result Date: 02/06/2020  ECHOCARDIOGRAM REPORT   Patient Name:   [facility]   Date of Exam: 02/06/2020 Medical Rec #:  [id number]  Height:       75.0 in Accession #:    [id number] Weight:       247.0 lb Date of Birth:  1945-11-16 BSA:          2.400 m Patient Age:    73 years   BP:           101/81 mmHg Patient Gender: M          HR:           99 bpm. Exam Location:  Inpatient Procedure: 2D Echo, Cardiac Doppler and Color Doppler Indications:    Atrial fibrillation  History:        Patient has prior history of Echocardiogram examinations, most                 recent 11/27/2014. CHF, Arrythmias:Atrial Fibrillation and LBBB,                 Signs/Symptoms:Shortness of Breath; Risk Factors:Hypertension.  Sonographer:    Lavenia Atlas Referring Phys: [phone] ARSHAD N KAKRAKANDY IMPRESSIONS  1. Left ventricular ejection fraction, by estimation, is 20 to 25%. The left ventricle has severely decreased function. The left ventricle demonstrates global hypokinesis with significant septal dyssynchrony. The left ventricular internal cavity size was severely dilated. There is mild concentric left ventricular hypertrophy.  2. Right ventricular systolic function is moderately reduced. The right ventricular size is moderately enlarged. There is normal pulmonary artery systolic pressure. The estimated right  ventricular systolic pressure is 26.2 mmHg.  3. Left atrial size was severely dilated.  4. The mitral valve is normal in structure. Mild mitral valve regurgitation. No evidence of mitral stenosis.  5. The aortic valve is normal in structure. Aortic valve regurgitation is mild. No aortic stenosis is present.  6. The inferior vena cava is normal in size with greater than 50% respiratory variability, suggesting right atrial pressure of 3 mmHg.  7. Right atrial size was mildly dilated. FINDINGS  Left Ventricle: Left ventricular ejection fraction, by estimation, is <20%. The left ventricle has severely decreased function. The left ventricle demonstrates global hypokinesis. The left ventricular internal cavity size was severely dilated. There is mild concentric left ventricular hypertrophy. Abnormal (paradoxical) septal motion, consistent with left bundle branch block. Left ventricular diastolic parameters are consistent with Grade II diastolic dysfunction (pseudonormalization). Elevated left atrial pressure. Right Ventricle: The right ventricular size is moderately enlarged. No increase in right ventricular wall thickness. Right ventricular systolic function is moderately reduced. There is normal pulmonary artery systolic pressure. The tricuspid regurgitant velocity is 2.41 m/s, and with an assumed right atrial pressure of 3 mmHg, the estimated right ventricular systolic pressure is 26.2 mmHg. Left Atrium: Left atrial size was severely dilated. Right Atrium: Right atrial size was mildly dilated. Pericardium: Trivial pericardial effusion is present. There is no evidence of cardiac tamponade. Mitral Valve: The mitral valve is normal in structure. Normal mobility of the mitral valve leaflets. Mild mitral valve regurgitation, with centrally-directed jet. No evidence of mitral valve stenosis. Tricuspid Valve: The tricuspid valve is normal in structure. Tricuspid valve regurgitation is mild . No evidence of tricuspid stenosis.  Aortic Valve: The aortic valve is normal in structure. Aortic valve regurgitation is mild. No aortic stenosis is present. Pulmonic Valve: The pulmonic valve was normal in structure. Pulmonic valve regurgitation is not visualized. No evidence of pulmonic stenosis. Aorta: The  aortic root is normal in size and structure. Venous: The inferior vena cava is normal in size with greater than 50% respiratory variability, suggesting right atrial pressure of 3 mmHg. IAS/Shunts: No atrial level shunt detected by color flow Doppler.  LEFT VENTRICLE PLAX 2D LVIDd:         7.20 cm  Diastology LVIDs:         6.50 cm  LV e' lateral:   4.24 cm/s LV PW:         1.10 cm  LV E/e' lateral: 19.0 LV IVS:        1.10 cm  LV e' medial:    4.79 cm/s LVOT diam:     2.70 cm  LV E/e' medial:  16.8 LV SV:         64 LV SV Index:   26 LVOT Area:     5.73 cm  RIGHT VENTRICLE RV Basal diam:  4.20 cm RV S prime:     9.36 cm/s TAPSE (M-mode): 2.5 cm LEFT ATRIUM             Index       RIGHT ATRIUM           Index LA diam:        5.60 cm 2.33 cm/m  RA Area:     19.40 cm LA Vol (A2C):   71.5 ml 29.79 ml/m RA Volume:   48.20 ml  20.08 ml/m LA Vol (A4C):   89.1 ml 37.12 ml/m LA Biplane Vol: 88.0 ml 36.66 ml/m  AORTIC VALVE LVOT Vmax:   69.20 cm/s LVOT Vmean:  43.300 cm/s LVOT VTI:    0.111 m  AORTA Ao Root diam: 3.30 cm MITRAL VALVE               TRICUSPID VALVE MV Area (PHT): 4.80 cm    TR Peak grad:   23.2 mmHg MV Decel Time: 158 msec    TR Vmax:        241.00 cm/s MV E velocity: 80.60 cm/s MV A velocity: 47.60 cm/s  SHUNTS MV E/A ratio:  1.69        Systemic VTI:  0.11 m                            Systemic Diam: 2.70 cm Ena Dawley MD Electronically signed by Ena Dawley MD Signature Date/Time: 02/06/2020/4:14:29 PM    Final         Scheduled Meds:  apixaban  5 mg Oral BID   clotrimazole-betamethasone  1 application Topical Daily   furosemide  40 mg Oral BID   Continuous Infusions:  amiodarone 30 mg/hr (02/07/20 0814)      LOS: 1 day    Time spent: 38 minutes spent on chart review, discussion with nursing staff, consultants, updating family and interview/physical exam; more than 50% of that time was spent in counseling and/or coordination of care.    Evalise Abruzzese J [place] DO [facility] Available via Epic secure chat 7am-7pm After these hours, please refer to coverage provider listed on [email or web address] 02/07/2020, 9:46 AM

## 2020-02-07 NOTE — Progress Notes (Signed)
Heart Failure Stewardship Pharmacist Progress Note   PCP: Burton Apley, MD PCP-Cardiologist: Tobias Alexander, MD    HPI:  74 yo M with PMH significant for paroxysmal afib (not on anticoagulation secondary to GI bleed), chronic systolic heart failure (previously refused ICD), chronic LBBB, and hypertensive heart disease. Admitted on 02/05/20 with afib with RVR.  ECHO from March 2016 with LVEF 25-30%.   LVEF now further reduced to 20-25% on ECHO from 02/06/20.  Anticipated DCCV on Friday  Current HF Medications: Furosemide 40 mg BID  Prior to admission HF Medications: Furosemide 40 mg daily PRN swelling Carvedilol 25 mg BID - not taking Lisinopril 40 mg daily Spironolactone 25 mg daily - not taking  Pertinent Lab Values: . Serum creatinine 1.36>1.9, BUN 36, Potassium 4.2, Sodium 140, BNP 789.9, Magnesium 2.3  Vital Signs: . Weight: 245 lbs (estimated dry weight: 245 lbs) . Blood pressure: 90/70s . Heart rate: 90-100s   Medication Assistance / Insurance Benefits Check: Does the patient have prescription insurance? Pending benefits check with case management Type of insurance plan: pending  Does the patient qualify for medication assistance through manufacturers or grants? pending  Eligible grants and/or patient assistance programs: pending  Medication assistance applications in progress: None  Medication assistance applications approved: None  Approved medication assistance renewals will be completed by: Dr. Lindaann Slough office  Outpatient Pharmacy:  Prior to admission outpatient pharmacy: [facility] Is the patient willing to use [facility] TOC pharmacy at discharge? Yes - discharge pharmacy has been updated Is the patient willing to transition their outpatient pharmacy to utilize a [facility] outpatient pharmacy? TBD    Assessment: 1. Chronic systolic CHF (EF 22-02%). NYHA class II/III symptoms. Mildly volume overloaded on MD exam. - Continue furosemide 40 mg  BID - Agree with holding carvedilol with low BP - Holding lisinopril with AKI - Consider restarting spironolactone once BP and SCr improve   Plan: 1) Medication changes recommended at this time: - None - BP soft with current regimen  2) Patient assistance application(s): - None pending benefits check  3)  Education - To be completed prior to discharge  Danae Orleans, PharmD, BCPS Heart Failure Stewardship Pharmacist Phone [phone] 02/07/2020       4:22 PM

## 2020-02-07 NOTE — Evaluation (Signed)
Occupational Therapy Evaluation Patient Details Name: Bryan Moran MRN: [id number] DOB: 17-Mar-1946 Today's Date: 02/07/2020    History of Present Illness Pt adm with SOB and weakness and found to have a-fib with rvr. PMH - chf, htn, ckd   Clinical Impression   PTA pt living alone, independent for BADL and in community. At time of eval, pt presents with generalized weakness and poor activity tolerance limiting ability to engage in BADL at desired level of independence. Pt completed bed mobility at min guard level, stated slight dizziness once EOB with VSS. Initiated ECS education for pacing techniques to improve independence with BADL mobility. Pt then completed sit <> stand and household level of functional mobility at min guard level with RW. Suspect pt will progress without need for post acute OT follow up, but will continue to follow acutely to safely progress BADLs during admission. Will follow per POC listed below.    Follow Up Recommendations  No OT follow up;Supervision - Intermittent    Equipment Recommendations  3 in 1 bedside commode    Recommendations for Other Services       Precautions / Restrictions Precautions Precautions: Fall Restrictions Weight Bearing Restrictions: No      Mobility Bed Mobility Overal bed mobility: Needs Assistance Bed Mobility: Supine to Sit     Supine to sit: Min guard     General bed mobility comments: increased time and effort, stated mild orthostatic symptoms on initial EOB That quickly passed with VSS  Transfers Overall transfer level: Needs assistance Equipment used: Rolling walker (2 wheeled) Transfers: Sit to/from Stand Sit to Stand: Min guard         General transfer comment: min guard for increased time and effort    Balance Overall balance assessment: Needs assistance Sitting-balance support: No upper extremity supported;Feet supported Sitting balance-Leahy Scale: Good     Standing balance support: Bilateral upper  extremity supported;During functional activity Standing balance-Leahy Scale: Fair Standing balance comment: more steady with external support                           ADL either performed or assessed with clinical judgement   ADL Overall ADL's : Needs assistance/impaired Eating/Feeding: Set up;Sitting   Grooming: Set up;Sitting;Standing   Upper Body Bathing: Set up;Sitting   Lower Body Bathing: Minimal assistance;Sit to/from stand;Sitting/lateral leans   Upper Body Dressing : Set up;Sitting   Lower Body Dressing: Minimal assistance;Sit to/from stand;Sitting/lateral leans   Toilet Transfer: Min guard;Ambulation;Regular Toilet;Grab bars;RW   Toileting- Clothing Manipulation and Hygiene: Set up;Sitting/lateral lean;Sit to/from stand   Tub/ Shower Transfer: Agricultural engineer;Ambulation;Shower seat   Functional mobility during ADLs: Min guard;Rolling walker       Vision Patient Visual Report: No change from baseline       Perception     Praxis      Pertinent Vitals/Pain Pain Assessment: No/denies pain     Hand Dominance     Extremity/Trunk Assessment Upper Extremity Assessment Upper Extremity Assessment: Overall WFL for tasks assessed   Lower Extremity Assessment Lower Extremity Assessment: Generalized weakness       Communication Communication Communication: No difficulties   Cognition Arousal/Alertness: Awake/alert Behavior During Therapy: WFL for tasks assessed/performed Overall Cognitive Status: Within Functional Limits for tasks assessed  General Comments  SpO2 >94% on RA with amb    Exercises     Shoulder Instructions      Home Living Family/patient expects to be discharged to:: Private residence Living Arrangements: Alone Available Help at Discharge: Family Type of Home: House Home Access: Stairs to enter [employer] of Steps: 2   Home Layout: One level;Other  (Comment)(has attic and basement but does not have to go in there)     Bathroom Shower/Tub: Producer, television/film/video: Standard     Home Equipment: Cane - single point;Walker - 2 wheels   Additional Comments: cane and walker from previous family members      Prior Functioning/Environment Level of Independence: Independent                 OT Problem List: Decreased strength;Decreased knowledge of use of DME or AE;Decreased activity tolerance;Cardiopulmonary status limiting activity;Impaired balance (sitting and/or standing)      OT Treatment/Interventions: Self-care/ADL training;Therapeutic exercise;Patient/family education;Balance training;Energy conservation;Therapeutic activities;DME and/or AE instruction    OT Goals(Current goals can be found in the care plan section) Acute Rehab OT Goals Patient Stated Goal: return to independence OT Goal Formulation: With patient Time For Goal Achievement: 02/21/20 Potential to Achieve Goals: Good  OT Frequency: Min 2X/week   Barriers to D/C:            Co-evaluation              AM-PAC OT "6 Clicks" Daily Activity     Outcome Measure Help from another person eating meals?: None Help from another person taking care of personal grooming?: None Help from another person toileting, which includes using toliet, bedpan, or urinal?: A Little Help from another person bathing (including washing, rinsing, drying)?: A Little Help from another person to put on and taking off regular upper body clothing?: None Help from another person to put on and taking off regular lower body clothing?: A Little 6 Click Score: 21   End of Session Equipment Utilized During Treatment: Gait belt;Rolling walker Nurse Communication: Mobility status  Activity Tolerance: Patient tolerated treatment well Patient left: Other (comment)(in hallway with PT)  OT Visit Diagnosis: Unsteadiness on feet (R26.81);Other abnormalities of gait and mobility  (R26.89);Muscle weakness (generalized) (M62.81)                Time: 2458-0998 OT Time Calculation (min): 13 min Charges:  OT General Charges $OT Visit: 1 Visit OT Evaluation $OT Eval Moderate Complexity: 1 Mod  Dalphine Handing, MSOT, OTR/L Acute Rehabilitation Services [facility] Office Number: [phone] Pager: [phone]  Dalphine Handing 02/07/2020, 4:55 PM

## 2020-02-08 ENCOUNTER — Inpatient Hospital Stay (HOSPITAL_COMMUNITY): Payer: BC Managed Care – PPO | Admitting: Certified Registered Nurse Anesthetist

## 2020-02-08 ENCOUNTER — Encounter (HOSPITAL_COMMUNITY)
Admission: EM | Disposition: A | Discharge status: Home Health | Payer: Self-pay | Source: Home / Self Care | Attending: Internal Medicine

## 2020-02-08 ENCOUNTER — Inpatient Hospital Stay (HOSPITAL_COMMUNITY): Payer: BC Managed Care – PPO

## 2020-02-08 ENCOUNTER — Encounter (HOSPITAL_COMMUNITY): Payer: Self-pay | Admitting: Internal Medicine

## 2020-02-08 DIAGNOSIS — I340 Nonrheumatic mitral (valve) insufficiency: Secondary | ICD-10-CM

## 2020-02-08 DIAGNOSIS — I4891 Unspecified atrial fibrillation: Secondary | ICD-10-CM

## 2020-02-08 HISTORY — PX: CARDIOVERSION: SHX1299

## 2020-02-08 HISTORY — PX: TEE WITHOUT CARDIOVERSION: SHX5443

## 2020-02-08 LAB — CBC
HCT: 47.7 % (ref 39.0–52.0)
Hemoglobin: 15.3 g/dL (ref 13.0–17.0)
MCH: 31.4 pg (ref 26.0–34.0)
MCHC: 32.1 g/dL (ref 30.0–36.0)
MCV: 97.9 fL (ref 80.0–100.0)
Platelets: 137 10*3/uL — ABNORMAL LOW (ref 150–400)
RBC: 4.87 MIL/uL (ref 4.22–5.81)
RDW: 15.3 % (ref 11.5–15.5)
WBC: 7.8 10*3/uL (ref 4.0–10.5)
nRBC: 0 % (ref 0.0–0.2)

## 2020-02-08 LAB — COMPREHENSIVE METABOLIC PANEL
ALT: 30 U/L (ref 0–44)
AST: 14 U/L — ABNORMAL LOW (ref 15–41)
Albumin: 2.7 g/dL — ABNORMAL LOW (ref 3.5–5.0)
Alkaline Phosphatase: 52 U/L (ref 38–126)
Anion gap: 8 (ref 5–15)
BUN: 37 mg/dL — ABNORMAL HIGH (ref 8–23)
CO2: 22 mmol/L (ref 22–32)
Calcium: 8.1 mg/dL — ABNORMAL LOW (ref 8.9–10.3)
Chloride: 109 mmol/L (ref 98–111)
Creatinine, Ser: 1.81 mg/dL — ABNORMAL HIGH (ref 0.61–1.24)
GFR calc Af Amer: 42 mL/min — ABNORMAL LOW (ref >60)
GFR calc non Af Amer: 37 mL/min — ABNORMAL LOW (ref >60)
Glucose, Bld: 112 mg/dL — ABNORMAL HIGH (ref 70–99)
Potassium: 3.5 mmol/L (ref 3.5–5.1)
Sodium: 139 mmol/L (ref 135–145)
Total Bilirubin: 1.2 mg/dL (ref 0.3–1.2)
Total Protein: 5.2 g/dL — ABNORMAL LOW (ref 6.5–8.1)

## 2020-02-08 LAB — MAGNESIUM: Magnesium: 2.1 mg/dL (ref 1.7–2.4)

## 2020-02-08 SURGERY — ECHOCARDIOGRAM, TRANSESOPHAGEAL
Anesthesia: Monitor Anesthesia Care

## 2020-02-08 MED ORDER — PROPOFOL 10 MG/ML IV BOLUS
INTRAVENOUS | Status: DC | PRN
  Administered 2020-02-08: 20 mg via INTRAVENOUS

## 2020-02-08 MED ORDER — BUTAMBEN-TETRACAINE-BENZOCAINE 2-2-14 % EX AERO
INHALATION_SPRAY | CUTANEOUS | Status: DC | PRN
  Administered 2020-02-08: 2 via TOPICAL

## 2020-02-08 MED ORDER — SODIUM CHLORIDE 0.9 % IV SOLN: INTRAVENOUS | Status: DC | PRN

## 2020-02-08 MED ORDER — PROPOFOL 500 MG/50ML IV EMUL
INTRAVENOUS | Status: DC | PRN
  Administered 2020-02-08: 80 ug/kg/min via INTRAVENOUS

## 2020-02-08 MED ORDER — PHENYLEPHRINE 40 MCG/ML (10ML) SYRINGE FOR IV PUSH (FOR BLOOD PRESSURE SUPPORT)
PREFILLED_SYRINGE | INTRAVENOUS | Status: DC | PRN
  Administered 2020-02-08: 120 ug via INTRAVENOUS
  Administered 2020-02-08 (×3): 80 ug via INTRAVENOUS
  Administered 2020-02-08: 120 ug via INTRAVENOUS
  Administered 2020-02-08 (×2): 80 ug via INTRAVENOUS
  Administered 2020-02-08: 120 ug via INTRAVENOUS

## 2020-02-08 MED ORDER — SODIUM CHLORIDE 0.9 % IV SOLN: INTRAVENOUS | Status: DC

## 2020-02-08 NOTE — Anesthesia Preprocedure Evaluation (Signed)
Anesthesia Evaluation  Patient identified by MRN, date of birth, ID band Patient awake    Reviewed: Allergy & Precautions, NPO status , Patient's Chart, lab work & pertinent test results  Airway Mallampati: II  TM Distance: >3 FB Neck ROM: Full    Dental  (+) Teeth Intact, Dental Advisory Given   Pulmonary     + decreased breath sounds      Cardiovascular hypertension,  Rhythm:Irregular Rate:Normal     Neuro/Psych    GI/Hepatic   Endo/Other    Renal/GU      Musculoskeletal   Abdominal   Peds  Hematology   Anesthesia Other Findings   Reproductive/Obstetrics                             Anesthesia Physical Anesthesia Plan  ASA: III  Anesthesia Plan: MAC and General   Post-op Pain Management:    Induction:   PONV Risk Score and Plan: Propofol infusion  Airway Management Planned: Natural Airway and Nasal Cannula  Additional Equipment:   Intra-op Plan:   Post-operative Plan:   Informed Consent: I have reviewed the patients History and Physical, chart, labs and discussed the procedure including the risks, benefits and alternatives for the proposed anesthesia with the patient or authorized representative who has indicated his/her understanding and acceptance.     Dental advisory given  Plan Discussed with: CRNA and Anesthesiologist  Anesthesia Plan Comments:         Anesthesia Quick Evaluation

## 2020-02-08 NOTE — Progress Notes (Signed)
Progress Note  Patient Name: Bryan Moran Date of Encounter: 02/08/2020  Primary Cardiologist: Tobias Alexander, MD   Subjective   Patient remains in Afib RVR, rates 90's Amiodarone tolerated at 30 mg/hr Yesterday after being stopped overnight for low BP He is scheduled for noon With Dr Duke Salvia  Inpatient Medications    Scheduled Meds: . apixaban  5 mg Oral BID  . clotrimazole-betamethasone  1 application Topical Daily  . furosemide  40 mg Oral BID   Continuous Infusions: . sodium chloride    . amiodarone 30 mg/hr (02/07/20 1900)   PRN Meds: ipratropium-albuterol, metoprolol tartrate, ondansetron **OR** ondansetron (ZOFRAN) IV, polyethylene glycol, senna-docusate, white petrolatum   Vital Signs    Vitals:   02/08/20 0011 02/08/20 0514 02/08/20 0541 02/08/20 0716  BP: 102/74 (!) 130/101 96/78 105/90  Pulse:  95 89 86  Resp:  (!) 22 (!) 22 20  Temp: (!) 96.4 F (35.8 C)  (!) 97.4 F (36.3 C) 97.6 F (36.4 C)  TempSrc: Axillary  Axillary Axillary  SpO2:  98% 98% 95%  Weight:      Height:        Intake/Output Summary (Last 24 hours) at 02/08/2020 0811 Last data filed at 02/07/2020 1900 Gross per 24 hour  Intake 2240.36 ml  Output 300 ml  Net 1940.36 ml   Last 3 Weights 02/06/2020 07/27/2018 12/15/2017  Weight (lbs) 245 lb 9.5 oz 247 lb 250 lb 12.8 oz  Weight (kg) 111.4 kg 112.038 kg 113.762 kg      Telemetry    Afib HR 110-120, PVCs - Personally Reviewed  ECG    Afib RVR with chronic LBBB, HR 126 on admission - Personally Reviewed  Physical Exam   GEN: No acute distress.   Neck: + JVD Cardiac: Irreg Irreg, no murmurs, rubs, or gallops.  Respiratory: Clear to auscultation bilaterally. GI: Soft, nontender, non-distended  MS: Mild lower leg edema; No deformity. Neuro:  Nonfocal  Psych: Normal affect   Labs    High Sensitivity Troponin:   Recent Labs  Lab 02/05/20 1800 02/05/20 2013 02/05/20 2304 02/06/20 0510  TROPONINIHS 75* 81* 83* 75*       Chemistry Recent Labs  Lab 02/06/20 0510 02/07/20 0217 02/08/20 0243  NA 140 140 139  K 3.8 4.2 3.5  CL 109 112* 109  CO2 20* 18* 22  GLUCOSE 99 122* 112*  BUN 29* 36* 37*  CREATININE 1.36* 1.90* 1.81*  CALCIUM 8.2* 8.1* 8.1*  PROT 5.0* 4.9* 5.2*  ALBUMIN 2.7* 2.6* 2.7*  AST 21 19 14*  ALT 35 29 30  ALKPHOS 53 50 52  BILITOT 2.7* 1.5* 1.2  GFRNONAA 52* 34* 37*  GFRAA 60* 40* 42*  ANIONGAP 11 10 8      Hematology Recent Labs  Lab 02/06/20 0012 02/07/20 0217 02/08/20 0243  WBC 7.4 8.4 7.8  RBC 4.76 4.81 4.87  HGB 15.4 15.3 15.3  HCT 46.9 47.9 47.7  MCV 98.5 99.6 97.9  MCH 32.4 31.8 31.4  MCHC 32.8 31.9 32.1  RDW 15.7* 15.4 15.3  PLT 145* 138* 137*    BNP Recent Labs  Lab 02/05/20 1802  BNP 789.9*     DDimer  Recent Labs  Lab 02/06/20 0640  DDIMER 1.92*     Radiology    ECHOCARDIOGRAM COMPLETE  Result Date: 02/06/2020    ECHOCARDIOGRAM REPORT   Patient Name:   Bryan Moran   Date of Exam: 02/06/2020 Medical Rec #:  04/07/2020  Height:  75.0 in Accession #:    [id number] Weight:       247.0 lb Date of Birth:  02/10/1946 BSA:          2.400 m Patient Age:    73 years   BP:           101/81 mmHg Patient Gender: M          HR:           99 bpm. Exam Location:  Inpatient Procedure: 2D Echo, Cardiac Doppler and Color Doppler Indications:    Atrial fibrillation  History:        Patient has prior history of Echocardiogram examinations, most                 recent 11/27/2014. CHF, Arrythmias:Atrial Fibrillation and LBBB,                 Signs/Symptoms:Shortness of Breath; Risk Factors:Hypertension.  Sonographer:    Lavenia Atlas Referring Phys: [phone] ARSHAD N KAKRAKANDY IMPRESSIONS  1. Left ventricular ejection fraction, by estimation, is 20 to 25%. The left ventricle has severely decreased function. The left ventricle demonstrates global hypokinesis with significant septal dyssynchrony. The left ventricular internal cavity size was severely dilated. There is mild  concentric left ventricular hypertrophy.  2. Right ventricular systolic function is moderately reduced. The right ventricular size is moderately enlarged. There is normal pulmonary artery systolic pressure. The estimated right ventricular systolic pressure is 26.2 mmHg.  3. Left atrial size was severely dilated.  4. The mitral valve is normal in structure. Mild mitral valve regurgitation. No evidence of mitral stenosis.  5. The aortic valve is normal in structure. Aortic valve regurgitation is mild. No aortic stenosis is present.  6. The inferior vena cava is normal in size with greater than 50% respiratory variability, suggesting right atrial pressure of 3 mmHg.  7. Right atrial size was mildly dilated. FINDINGS  Left Ventricle: Left ventricular ejection fraction, by estimation, is <20%. The left ventricle has severely decreased function. The left ventricle demonstrates global hypokinesis. The left ventricular internal cavity size was severely dilated. There is mild concentric left ventricular hypertrophy. Abnormal (paradoxical) septal motion, consistent with left bundle branch block. Left ventricular diastolic parameters are consistent with Grade II diastolic dysfunction (pseudonormalization). Elevated left atrial pressure. Right Ventricle: The right ventricular size is moderately enlarged. No increase in right ventricular wall thickness. Right ventricular systolic function is moderately reduced. There is normal pulmonary artery systolic pressure. The tricuspid regurgitant velocity is 2.41 m/s, and with an assumed right atrial pressure of 3 mmHg, the estimated right ventricular systolic pressure is 26.2 mmHg. Left Atrium: Left atrial size was severely dilated. Right Atrium: Right atrial size was mildly dilated. Pericardium: Trivial pericardial effusion is present. There is no evidence of cardiac tamponade. Mitral Valve: The mitral valve is normal in structure. Normal mobility of the mitral valve leaflets. Mild  mitral valve regurgitation, with centrally-directed jet. No evidence of mitral valve stenosis. Tricuspid Valve: The tricuspid valve is normal in structure. Tricuspid valve regurgitation is mild . No evidence of tricuspid stenosis. Aortic Valve: The aortic valve is normal in structure. Aortic valve regurgitation is mild. No aortic stenosis is present. Pulmonic Valve: The pulmonic valve was normal in structure. Pulmonic valve regurgitation is not visualized. No evidence of pulmonic stenosis. Aorta: The aortic root is normal in size and structure. Venous: The inferior vena cava is normal in size with greater than 50% respiratory variability, suggesting right atrial pressure of 3  mmHg. IAS/Shunts: No atrial level shunt detected by color flow Doppler.  LEFT VENTRICLE PLAX 2D LVIDd:         7.20 cm  Diastology LVIDs:         6.50 cm  LV e' lateral:   4.24 cm/s LV PW:         1.10 cm  LV E/e' lateral: 19.0 LV IVS:        1.10 cm  LV e' medial:    4.79 cm/s LVOT diam:     2.70 cm  LV E/e' medial:  16.8 LV SV:         64 LV SV Index:   26 LVOT Area:     5.73 cm  RIGHT VENTRICLE RV Basal diam:  4.20 cm RV S prime:     9.36 cm/s TAPSE (M-mode): 2.5 cm LEFT ATRIUM             Index       RIGHT ATRIUM           Index LA diam:        5.60 cm 2.33 cm/m  RA Area:     19.40 cm LA Vol (A2C):   71.5 ml 29.79 ml/m RA Volume:   48.20 ml  20.08 ml/m LA Vol (A4C):   89.1 ml 37.12 ml/m LA Biplane Vol: 88.0 ml 36.66 ml/m  AORTIC VALVE LVOT Vmax:   69.20 cm/s LVOT Vmean:  43.300 cm/s LVOT VTI:    0.111 m  AORTA Ao Root diam: 3.30 cm MITRAL VALVE               TRICUSPID VALVE MV Area (PHT): 4.80 cm    TR Peak grad:   23.2 mmHg MV Decel Time: 158 msec    TR Vmax:        241.00 cm/s MV E velocity: 80.60 cm/s MV A velocity: 47.60 cm/s  SHUNTS MV E/A ratio:  1.69        Systemic VTI:  0.11 m                            Systemic Diam: 2.70 cm Ena Dawley MD Electronically signed by Ena Dawley MD Signature Date/Time:  02/06/2020/4:14:29 PM    Final     Cardiac Studies   Echo 02/06/20  1. Left ventricular ejection fraction, by estimation, is 20 to 25%. The  left ventricle has severely decreased function. The left ventricle  demonstrates global hypokinesis with significant septal dyssynchrony. The  left ventricular internal cavity size  was severely dilated. There is mild concentric left ventricular  hypertrophy.   2. Right ventricular systolic function is moderately reduced. The right  ventricular size is moderately enlarged. There is normal pulmonary artery  systolic pressure. The estimated right ventricular systolic pressure is  78.2 mmHg.   3. Left atrial size was severely dilated.   4. The mitral valve is normal in structure. Mild mitral valve  regurgitation. No evidence of mitral stenosis.   5. The aortic valve is normal in structure. Aortic valve regurgitation is  mild. No aortic stenosis is present.   6. The inferior vena cava is normal in size with greater than 50%  respiratory variability, suggesting right atrial pressure of 3 mmHg.   7. Right atrial size was mildly dilated.   Patient Profile     74 y.o. male with a hx of chronic systolic CHF with last LVEF at 25 to 30% per  echo in 2016>>followed by Dr. Johney Frame and refused Biv ICD, chronic LBBB, hypertensive heart disease with CHF, paroxysmal/persistent atrial fibrillation previously controlled by amiodarone and no anticoagulation secondary to GI bleed who is being seen today for the evaluation of AF with RVR and CHF.   Assessment & Plan   Paroxysmal Afib with RVR - previous noncompliance with medications - CHADSVASC 4 - IV amiodarone started - Eliquis 5mg  BID. Patient has previous GIB, continue to monitor daily labs - TSH normal - Patient remains in rapid Afib - Plan for TEE/DCCVtoday Discussed risks including intubation and need for TMP, esophageal injury Willing to proceed   Acute on chronic systolic HF - EF in 2016 -  repeat echo showed EF 20-25% global hypokinesis with significant septal dyssynchrony - Oral lasix 40 mg BID - put out 2017 urine - creatinine 1.26>1.90. Will continue laisx for now given mild edema on exam and firm abdomen. Might require IV lasix  Mild MR/mild AR - per echo  AKI.CKD stage 3 - 1.90 this AM - lisinopril held  HTN - lisinopril held for AKI - pressures soft this AM, yesterday pressures were as low as systolics in the 60s. Most recent was 100/80 - If pressures tolerate can consider starting Entresto for low EF  For questions or updates, please contact [facility] Please consult [email or web address] for contact info under        Signed, , MD  02/08/2020, 8:11 AM

## 2020-02-08 NOTE — Progress Notes (Signed)
PT Cancellation Note  Patient Details Name: Bryan Moran MRN: [id number] DOB: June 23, 1946   Cancelled Treatment:    Reason Eval/Treat Not Completed: Patient at procedure or test/unavailable   Angelina Ok [address] [facility] 02/08/2020, 11:00 AM  Skip Mayer PT Acute Rehabilitation Services Pager [phone] Office [phone]

## 2020-02-08 NOTE — H&P (View-Only) (Signed)
 Progress Note  Patient Name: Bryan Moran Date of Encounter: 02/08/2020  Primary Cardiologist: Katarina Nelson, MD   Subjective   Patient remains in Afib RVR, rates 90's Amiodarone tolerated at 30 mg/hr Yesterday after being stopped overnight for low BP He is scheduled for noon With Dr Tupelo  Inpatient Medications    Scheduled Meds: . apixaban  5 mg Oral BID  . clotrimazole-betamethasone  1 application Topical Daily  . furosemide  40 mg Oral BID   Continuous Infusions: . sodium chloride    . amiodarone 30 mg/hr (02/07/20 1900)   PRN Meds: ipratropium-albuterol, metoprolol tartrate, ondansetron **OR** ondansetron (ZOFRAN) IV, polyethylene glycol, senna-docusate, white petrolatum   Vital Signs    Vitals:   02/08/20 0011 02/08/20 0514 02/08/20 0541 02/08/20 0716  BP: 102/74 (!) 130/101 96/78 105/90  Pulse:  95 89 86  Resp:  (!) 22 (!) 22 20  Temp: (!) 96.4 F (35.8 C)  (!) 97.4 F (36.3 C) 97.6 F (36.4 C)  TempSrc: Axillary  Axillary Axillary  SpO2:  98% 98% 95%  Weight:      Height:        Intake/Output Summary (Last 24 hours) at 02/08/2020 0811 Last data filed at 02/07/2020 1900 Gross per 24 hour  Intake 2240.36 ml  Output 300 ml  Net 1940.36 ml   Last 3 Weights 02/06/2020 07/27/2018 12/15/2017  Weight (lbs) 245 lb 9.5 oz 247 lb 250 lb 12.8 oz  Weight (kg) 111.4 kg 112.038 kg 113.762 kg      Telemetry    Afib HR 110-120, PVCs - Personally Reviewed  ECG    Afib RVR with chronic LBBB, HR 126 on admission - Personally Reviewed  Physical Exam   GEN: No acute distress.   Neck: + JVD Cardiac: Irreg Irreg, no murmurs, rubs, or gallops.  Respiratory: Clear to auscultation bilaterally. GI: Soft, nontender, non-distended  MS: Mild lower leg edema; No deformity. Neuro:  Nonfocal  Psych: Normal affect   Labs    High Sensitivity Troponin:   Recent Labs  Lab 02/05/20 1800 02/05/20 2013 02/05/20 2304 02/06/20 0510  TROPONINIHS 75* 81* 83* 75*       Chemistry Recent Labs  Lab 02/06/20 0510 02/07/20 0217 02/08/20 0243  NA 140 140 139  K 3.8 4.2 3.5  CL 109 112* 109  CO2 20* 18* 22  GLUCOSE 99 122* 112*  BUN 29* 36* 37*  CREATININE 1.36* 1.90* 1.81*  CALCIUM 8.2* 8.1* 8.1*  PROT 5.0* 4.9* 5.2*  ALBUMIN 2.7* 2.6* 2.7*  AST 21 19 14*  ALT 35 29 30  ALKPHOS 53 50 52  BILITOT 2.7* 1.5* 1.2  GFRNONAA 52* 34* 37*  GFRAA 60* 40* 42*  ANIONGAP 11 10 8     Hematology Recent Labs  Lab 02/06/20 0012 02/07/20 0217 02/08/20 0243  WBC 7.4 8.4 7.8  RBC 4.76 4.81 4.87  HGB 15.4 15.3 15.3  HCT 46.9 47.9 47.7  MCV 98.5 99.6 97.9  MCH 32.4 31.8 31.4  MCHC 32.8 31.9 32.1  RDW 15.7* 15.4 15.3  PLT 145* 138* 137*    BNP Recent Labs  Lab 02/05/20 1802  BNP 789.9*     DDimer  Recent Labs  Lab 02/06/20 0640  DDIMER 1.92*     Radiology    ECHOCARDIOGRAM COMPLETE  Result Date: 02/06/2020    ECHOCARDIOGRAM REPORT   Patient Name:   Katelyn Schadler   Date of Exam: 02/06/2020 Medical Rec #:  [id number]  Height:         75.0 in Accession #:    [id number] Weight:       247.0 lb Date of Birth:  02/10/1946 BSA:          2.400 m Patient Age:    73 years   BP:           101/81 mmHg Patient Gender: M          HR:           99 bpm. Exam Location:  Inpatient Procedure: 2D Echo, Cardiac Doppler and Color Doppler Indications:    Atrial fibrillation  History:        Patient has prior history of Echocardiogram examinations, most                 recent 11/27/2014. CHF, Arrythmias:Atrial Fibrillation and LBBB,                 Signs/Symptoms:Shortness of Breath; Risk Factors:Hypertension.  Sonographer:    Lavenia Atlas Referring Phys: [phone] ARSHAD N KAKRAKANDY IMPRESSIONS  1. Left ventricular ejection fraction, by estimation, is 20 to 25%. The left ventricle has severely decreased function. The left ventricle demonstrates global hypokinesis with significant septal dyssynchrony. The left ventricular internal cavity size was severely dilated. There is mild  concentric left ventricular hypertrophy.  2. Right ventricular systolic function is moderately reduced. The right ventricular size is moderately enlarged. There is normal pulmonary artery systolic pressure. The estimated right ventricular systolic pressure is 26.2 mmHg.  3. Left atrial size was severely dilated.  4. The mitral valve is normal in structure. Mild mitral valve regurgitation. No evidence of mitral stenosis.  5. The aortic valve is normal in structure. Aortic valve regurgitation is mild. No aortic stenosis is present.  6. The inferior vena cava is normal in size with greater than 50% respiratory variability, suggesting right atrial pressure of 3 mmHg.  7. Right atrial size was mildly dilated. FINDINGS  Left Ventricle: Left ventricular ejection fraction, by estimation, is <20%. The left ventricle has severely decreased function. The left ventricle demonstrates global hypokinesis. The left ventricular internal cavity size was severely dilated. There is mild concentric left ventricular hypertrophy. Abnormal (paradoxical) septal motion, consistent with left bundle branch block. Left ventricular diastolic parameters are consistent with Grade II diastolic dysfunction (pseudonormalization). Elevated left atrial pressure. Right Ventricle: The right ventricular size is moderately enlarged. No increase in right ventricular wall thickness. Right ventricular systolic function is moderately reduced. There is normal pulmonary artery systolic pressure. The tricuspid regurgitant velocity is 2.41 m/s, and with an assumed right atrial pressure of 3 mmHg, the estimated right ventricular systolic pressure is 26.2 mmHg. Left Atrium: Left atrial size was severely dilated. Right Atrium: Right atrial size was mildly dilated. Pericardium: Trivial pericardial effusion is present. There is no evidence of cardiac tamponade. Mitral Valve: The mitral valve is normal in structure. Normal mobility of the mitral valve leaflets. Mild  mitral valve regurgitation, with centrally-directed jet. No evidence of mitral valve stenosis. Tricuspid Valve: The tricuspid valve is normal in structure. Tricuspid valve regurgitation is mild . No evidence of tricuspid stenosis. Aortic Valve: The aortic valve is normal in structure. Aortic valve regurgitation is mild. No aortic stenosis is present. Pulmonic Valve: The pulmonic valve was normal in structure. Pulmonic valve regurgitation is not visualized. No evidence of pulmonic stenosis. Aorta: The aortic root is normal in size and structure. Venous: The inferior vena cava is normal in size with greater than 50% respiratory variability, suggesting right atrial pressure of 3  mmHg. IAS/Shunts: No atrial level shunt detected by color flow Doppler.  LEFT VENTRICLE PLAX 2D LVIDd:         7.20 cm  Diastology LVIDs:         6.50 cm  LV e' lateral:   4.24 cm/s LV PW:         1.10 cm  LV E/e' lateral: 19.0 LV IVS:        1.10 cm  LV e' medial:    4.79 cm/s LVOT diam:     2.70 cm  LV E/e' medial:  16.8 LV SV:         64 LV SV Index:   26 LVOT Area:     5.73 cm  RIGHT VENTRICLE RV Basal diam:  4.20 cm RV S prime:     9.36 cm/s TAPSE (M-mode): 2.5 cm LEFT ATRIUM             Index       RIGHT ATRIUM           Index LA diam:        5.60 cm 2.33 cm/m  RA Area:     19.40 cm LA Vol (A2C):   71.5 ml 29.79 ml/m RA Volume:   48.20 ml  20.08 ml/m LA Vol (A4C):   89.1 ml 37.12 ml/m LA Biplane Vol: 88.0 ml 36.66 ml/m  AORTIC VALVE LVOT Vmax:   69.20 cm/s LVOT Vmean:  43.300 cm/s LVOT VTI:    0.111 m  AORTA Ao Root diam: 3.30 cm MITRAL VALVE               TRICUSPID VALVE MV Area (PHT): 4.80 cm    TR Peak grad:   23.2 mmHg MV Decel Time: 158 msec    TR Vmax:        241.00 cm/s MV E velocity: 80.60 cm/s MV A velocity: 47.60 cm/s  SHUNTS MV E/A ratio:  1.69        Systemic VTI:  0.11 m                            Systemic Diam: 2.70 cm Ena Dawley MD Electronically signed by Ena Dawley MD Signature Date/Time:  02/06/2020/4:14:29 PM    Final     Cardiac Studies   Echo 02/06/20  1. Left ventricular ejection fraction, by estimation, is 20 to 25%. The  left ventricle has severely decreased function. The left ventricle  demonstrates global hypokinesis with significant septal dyssynchrony. The  left ventricular internal cavity size  was severely dilated. There is mild concentric left ventricular  hypertrophy.   2. Right ventricular systolic function is moderately reduced. The right  ventricular size is moderately enlarged. There is normal pulmonary artery  systolic pressure. The estimated right ventricular systolic pressure is  78.2 mmHg.   3. Left atrial size was severely dilated.   4. The mitral valve is normal in structure. Mild mitral valve  regurgitation. No evidence of mitral stenosis.   5. The aortic valve is normal in structure. Aortic valve regurgitation is  mild. No aortic stenosis is present.   6. The inferior vena cava is normal in size with greater than 50%  respiratory variability, suggesting right atrial pressure of 3 mmHg.   7. Right atrial size was mildly dilated.   Patient Profile     74 y.o. male with a hx of chronic systolic CHF with last LVEF at 25 to 30% per  echo in 2016>>followed by Dr. Allred and refused Biv ICD, chronic LBBB, hypertensive heart disease with CHF, paroxysmal/persistent atrial fibrillation previously controlled by amiodarone and no anticoagulation secondary to GI bleed who is being seen today for the evaluation of AF with RVR and CHF.   Assessment & Plan   Paroxysmal Afib with RVR - previous noncompliance with medications - CHADSVASC 4 - IV amiodarone started - Eliquis 5mg BID. Patient has previous GIB, continue to monitor daily labs - TSH normal - Patient remains in rapid Afib - Plan for TEE/DCCVtoday Discussed risks including intubation and need for TMP, esophageal injury Willing to proceed   Acute on chronic systolic HF - EF 25-30% in 2016 -  repeat echo showed EF 20-25% global hypokinesis with significant septal dyssynchrony - Oral lasix 40 mg BID - put out 900mL urine - creatinine 1.26>1.90. Will continue laisx for now given mild edema on exam and firm abdomen. Might require IV lasix  Mild MR/mild AR - per echo  AKI.CKD stage 3 - 1.90 this AM - lisinopril held  HTN - lisinopril held for AKI - pressures soft this AM, yesterday pressures were as low as systolics in the 60s. Most recent was 100/80 - If pressures tolerate can consider starting Entresto for low EF  For questions or updates, please contact [facility] Please consult [email or web address] for contact info under        Signed, Neaveh Belanger, MD  02/08/2020, 8:11 AM     

## 2020-02-08 NOTE — Progress Notes (Signed)
  Echocardiogram Echocardiogram Transesophageal has been performed.  Gerda Diss 02/08/2020, 11:55 AM

## 2020-02-08 NOTE — Interval H&P Note (Signed)
History and Physical Interval Note:  02/08/2020 10:56 AM  Bryan Moran  has presented today for surgery, with the diagnosis of afib.  The various methods of treatment have been discussed with the patient and family. After consideration of risks, benefits and other options for treatment, the patient has consented to  Procedure(s): TRANSESOPHAGEAL ECHOCARDIOGRAM (TEE) (N/A) CARDIOVERSION (N/A) as a surgical intervention.  The patient's history has been reviewed, patient examined, no change in status, stable for surgery.  I have reviewed the patient's chart and labs.  Questions were answered to the patient's satisfaction.     Chilton Si, MD

## 2020-02-08 NOTE — Plan of Care (Signed)
  Problem: Education: Goal: Knowledge of General Education information will improve Description: Including pain rating scale, medication(s)/side effects and non-pharmacologic comfort measures Outcome: Progressing   Problem: Clinical Measurements: Goal: Ability to maintain clinical measurements within normal limits will improve Outcome: Progressing Goal: Will remain free from infection Outcome: Progressing   

## 2020-02-08 NOTE — Progress Notes (Signed)
Heart Failure Stewardship Pharmacist Progress Note   PCP: Burton Apley, MD PCP-Cardiologist: Tobias Alexander, MD    HPI:  74 yo M with PMH significant for paroxysmal afib (not on anticoagulation secondary to GI bleed), chronic systolic heart failure (previously refused ICD), chronic LBBB, and hypertensive heart disease. Admitted on 02/05/20 with afib with RVR.  ECHO from March 2016 with LVEF 25-30%.   LVEF now further reduced to 20-25% on ECHO from 02/06/20.  Planned DCCV today  Current HF Medications: Furosemide 40 mg BID  Prior to admission HF Medications: Furosemide 40 mg daily PRN swelling Carvedilol 25 mg BID - not taking Lisinopril 40 mg daily Spironolactone 25 mg daily - not taking  Pertinent Lab Values: . Serum creatinine 1.81, BUN 37, Potassium 3.5, Sodium 139, BNP 789.9, Magnesium 2.1  Vital Signs: . Weight: 245 lbs (estimated dry weight: 245 lbs) . Blood pressure: ~90/70s . Heart rate: 90-100s   Medication Assistance / Insurance Benefits Check: Does the patient have prescription insurance? Pending benefits check with case management Type of insurance plan: pending  Does the patient qualify for medication assistance through manufacturers or grants? pending  Eligible grants and/or patient assistance programs: pending  Medication assistance applications in progress: None  Medication assistance applications approved: None  Approved medication assistance renewals will be completed by: Dr. Lindaann Slough office  Outpatient Pharmacy:  Prior to admission outpatient pharmacy: [facility] Is the patient willing to use [facility] pharmacy at discharge? Yes - discharge pharmacy has been updated Is the patient willing to transition their outpatient pharmacy to utilize a [facility] outpatient pharmacy? TBD    Assessment: 1. Chronic systolic CHF (EF 27-78%). NYHA class II/III symptoms. Mildly volume overloaded on MD exam. - Continue furosemide 40 mg BID - Agree  with holding carvedilol with low BP - Holding lisinopril with AKI - Consider restarting spironolactone once BP and SCr improve   Plan: 1) Medication changes recommended at this time: - None - BP soft with current regimen  2) Patient assistance application(s): - None pending benefits check  3)  Education - To be completed prior to discharge  Danae Orleans, PharmD, BCPS Heart Failure Stewardship Pharmacist Phone [phone] 02/08/2020       8:30 AM

## 2020-02-08 NOTE — Transfer of Care (Signed)
Immediate Anesthesia Transfer of Care Note  Patient: Bryan Moran  Procedure(s) Performed: TRANSESOPHAGEAL ECHOCARDIOGRAM (TEE) (N/A ) CARDIOVERSION (N/A )  Patient Location: Endoscopy Unit  Anesthesia Type:MAC  Level of Consciousness: awake and alert   Airway & Oxygen Therapy: Patient Spontanous Breathing and Patient connected to nasal cannula oxygen  Post-op Assessment: Report given to RN and Post -op Vital signs reviewed and stable  Post vital signs: Reviewed and stable  Last Vitals:  Vitals Value Taken Time  BP    Temp    Pulse 52   Resp 22 02/08/20 1153  SpO2 98%   Vitals shown include unvalidated device data.  Last Pain:  Vitals:   02/08/20 1015  TempSrc: Temporal  PainSc: 0-No pain         Complications: No apparent anesthesia complications

## 2020-02-08 NOTE — Progress Notes (Signed)
PROGRESS NOTE    Bryan Moran  LKJ:[id number] DOB: [birth year] DOA: 02/05/2020 PCP: Burton Apley, MD    Brief Narrative:  Bryan Moran is a 74 year old Caucasian male with past medical history remarkable for chronic combined systolic/diastolic congestive heart failure last EF 25-30% with previous refusal of ICD, chronic left bundle branch block, essential hypertension, paroxysmal/persistent atrial fibrillation CKD stage III who presented with progressive shortness of breath, weakness.  In the ER patient was in A. fib with RVR blood pressure was fluctuating between normal to very high and was started on Cardizem infusion.  Due to mildly elevated lactate and low normal blood pressure final cc normal saline bolus was given for which blood pressure has remained stable.  Patient's creatinine has increased from 1.1 about 3 years ago to 1.70.  LFTs are normal abdomen appears benign.  Chest x-ray showing features concerning for left hilar mass with upper lobe density for which CT chest was recommended and CT chest showed bilateral hilar airspace disease for which follow-up was recommended.  Patient is afebrile denies any productive cough fever or chills.  No definite signs of any pneumonia.  BNP was elevated at 789 high sensitive troponin was 75-81 lactic acid was 2.4 going improved to 2.1 CBC was unremarkable.  Mild lower extremity edema.  Stool studies have been ordered.  Patient admitted for A. fib with RVR with shortness of breath which likely could be from CHF.    Assessment & Plan:   Active Problems:   Chronic systolic dysfunction of left ventricle   HTN (hypertension)   Atrial fibrillation with RVR (HCC)   Diarrhea   AKI (acute kidney injury) (HCC)   Acute respiratory failure with hypoxia (HCC)   Acute hypoxic respiratory failure Acute on chronic decompensated combined systolic/diastolic CHF exacerbation Patient presenting with progressive shortness of breath, history of medical noncompliance.   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  BNP elevated 789.  TTE with EF 20-25%, severely decreased LV function with global hypokinesis and septal dyssynchrony, severely dilated LV, RV function mildly reduced, LA severely dilated with normal IVC and grade 2 diastolic dysfunction. --Cardiology following, appreciate assistance --Initially started on Coreg drip but was discontinued secondary to hypotension on 5/12 --Continue furosemide 40 mg p.o.BID --Strict I's and O's and daily weights --Continue supplemental oxygen, maintain SPO2 greater than 92%; on 2 LNC today; although nare portion of nasal cannula pulled down over chin  Paroxysmal/permanent atrial fibrillation with rapid ventricular response Etiology likely secondary to noncompliance with medication regimen.  Previous was on amiodarone and he has stopped for some time now with unclear reason. --Continue amiodarone drip --Coreg/Cardizem was discontinued 5/12 secondary to hypotension --Continue Eliquis for anticoagulation --Cardiology plans TEE/DCCV this afternoon --Continue monitor on telemetry  Acute on chronic kidney disease stage IIIa Baseline creatinine 1.3 --Cr 1.73-->1.36-->1.90-->1.81.  --Patient's home lisinopril discontinued --Continues on furosemide 40 mg p.o. twice daily --Continue to monitor renal function closely with diuresis  Generalized weakness, Deconditioning Patient currently lives at home alone, no significant family support close as sister lives 90 miles away in [place].  Evaluated by PT recommends home health.  OT with no recommendations.  Patchy airspace disease, right upper lobe/anterior left hilum Incidental finding on CT chest.  Will need follow-up CT chest in 2-3 months to ensure resolution as underlying neoplastic process cannot be excluded at this time.   DVT prophylaxis: Eliquis Code Status: Full code Family  Communication: No family  present at bedside  Disposition Plan:  Status is: Inpatient  Remains inpatient appropriate because:Hemodynamically unstable, Ongoing diagnostic testing needed not appropriate for outpatient work up, Unsafe d/c plan and IV treatments appropriate due to intensity of illness or inability to take PO pending TEE/DCCV, heart rate still not controlled, continues on IV amiodarone, patient hypotensive   Dispo: The patient is from: Home              Anticipated d/c is to: Home              Anticipated d/c date is: 2 days              Patient currently is not medically stable to d/c.   Consultants:   Cardiology  Procedures:   T EE/DCCV: Pending for today  Antimicrobials:   None   Subjective: Patient seen and examined at bedside, cardiology and nursing present.  Continues with mild shortness of breath, slightly improved.  Continues require supplemental oxygen, now on 2 L nasal cannula, although currently not appropriately placed within his nares and oxygenating well.  Also reports generalized weakness/fatigue.  Patient with many questions regarding procedure this afternoon, which was explained in detail by cardiology this morning.  No other specific complaints this morning.  Denies headache, no visual changes, no chest pain, no palpitations, no abdominal pain, no cough/congestion, no nausea/vomiting/diarrhea, no fever/chills/night sweats.  Nursing with concerns of hypotension overnight, otherwise no other acute events overnight per nursing staff.  Objective: Vitals:   02/08/20 0514 02/08/20 0541 02/08/20 0716 02/08/20 1015  BP: (!) 130/101 96/78 105/90 100/65  Pulse: 95 89 86 93  Resp: (!) 22 (!) 22 20 (!) 24  Temp:  (!) 97.4 F (36.3 C) 97.6 F (36.4 C) (!) 97.2 F (36.2 C)  TempSrc:  Axillary Axillary Temporal  SpO2: 98% 98% 95% 94%  Weight:      Height:        Intake/Output Summary (Last 24 hours) at 02/08/2020 1150 Last data filed at 02/08/2020  1149 Gross per 24 hour  Intake 2490.35 ml  Output 300 ml  Net 2190.35 ml   Filed Weights   02/06/20 1630  Weight: 111.4 kg    Examination:  General exam: Appears calm and comfortable  Respiratory system: Clear to auscultation. Respiratory effort normal. On 2L [place] oxygenating 98% Cardiovascular system: Tachycardic, irregularly irregular rhythm. No JVD, murmurs, rubs, gallops or clicks.  1+ pitting edema bilateral lower extremities, right greater than left Gastrointestinal system: Abdomen is nondistended, soft and nontender. No organomegaly or masses felt. Normal bowel sounds heard. Central nervous system: Alert and oriented. No focal neurological deficits. Extremities: Symmetric 5 x 5 power. Skin: No rashes, lesions or ulcers Psychiatry: Judgement and insight appear poor. Mood & affect appropriate.     Data Reviewed: I have personally reviewed following labs and imaging studies  CBC: Recent Labs  Lab 02/05/20 1800 02/06/20 0012 02/07/20 0217 02/08/20 0243  WBC 7.0 7.4 8.4 7.8  HGB 15.8 15.4 15.3 15.3  HCT 50.3 46.9 47.9 47.7  MCV 100.0 98.5 99.6 97.9  PLT 161 145* 138* 137*   Basic Metabolic Panel: Recent Labs  Lab 02/05/20 1800 02/06/20 0510 02/07/20 0217 02/08/20 0243  NA 143 140 140 139  K 4.3 3.8 4.2 3.5  CL 108 109 112* 109  CO2 24 20* 18* 22  GLUCOSE 86 99 122* 112*  BUN 32* 29* 36* 37*  CREATININE 1.73* 1.36* 1.90* 1.81*  CALCIUM 8.6* 8.2* 8.1* 8.1*  MG 2.5*  --  2.3 2.1   GFR: Estimated Creatinine Clearance: 49.7 mL/min (A) (by C-G formula based on SCr of 1.81 mg/dL (H)). Liver Function Tests: Recent Labs  Lab 02/05/20 1800 02/06/20 0510 02/07/20 0217 02/08/20 0243  AST 28 21 19  14*  ALT 41 35 29 30  ALKPHOS 52 53 50 52  BILITOT 2.0* 2.7* 1.5* 1.2  PROT 5.1* 5.0* 4.9* 5.2*  ALBUMIN 2.8* 2.7* 2.6* 2.7*   Recent Labs  Lab 02/05/20 1800  LIPASE 28   No results for input(s): AMMONIA in the last 168 hours. Coagulation Profile: No results  for input(s): INR, PROTIME in the last 168 hours. Cardiac Enzymes: No results for input(s): CKTOTAL, CKMB, CKMBINDEX, TROPONINI in the last 168 hours. BNP (last 3 results) No results for input(s): PROBNP in the last 8760 hours. HbA1C: No results for input(s): HGBA1C in the last 72 hours. CBG: No results for input(s): GLUCAP in the last 168 hours. Lipid Profile: No results for input(s): CHOL, HDL, LDLCALC, TRIG, CHOLHDL, LDLDIRECT in the last 72 hours. Thyroid Function Tests: Recent Labs    02/05/20 2304  TSH 3.183   Anemia Panel: No results for input(s): VITAMINB12, FOLATE, FERRITIN, TIBC, IRON, RETICCTPCT in the last 72 hours. Sepsis Labs: Recent Labs  Lab 02/05/20 1802 02/05/20 2013 02/05/20 2304 02/06/20 0510  PROCALCITON  --   --  <0.10  --   LATICACIDVEN 2.4* 2.1*  --  1.3    Recent Results (from the past 240 hour(s))  SARS Coronavirus 2 by RT PCR (hospital order, performed in [facility] hospital lab) Nasopharyngeal Nasopharyngeal Swab     Status: None   Collection Time: 02/05/20  6:36 PM   Specimen: Nasopharyngeal Swab  Result Value Ref Range Status   SARS Coronavirus 2 NEGATIVE NEGATIVE Final    Comment: (NOTE) SARS-CoV-2 target nucleic acids are NOT DETECTED. The SARS-CoV-2 RNA is generally detectable in upper and lower respiratory specimens during the acute phase of infection. The lowest concentration of SARS-CoV-2 viral copies this assay can detect is 250 copies / mL. A negative result does not preclude SARS-CoV-2 infection and should not be used as the sole basis for treatment or other patient management decisions.  A negative result may occur with improper specimen collection / handling, submission of specimen other than nasopharyngeal swab, presence of viral mutation(s) within the areas targeted by this assay, and inadequate number of viral copies (<250 copies / mL). A negative result must be combined with clinical observations, patient history, and  epidemiological information. Fact Sheet for Patients:   StrictlyIdeas.no Fact Sheet for Healthcare Providers: BankingDealers.co.za This test is not yet approved or cleared  by the [place] FDA and has been authorized for detection and/or diagnosis of SARS-CoV-2 by FDA under an Emergency Use Authorization (EUA).  This EUA will remain in effect (meaning this test can be used) for the duration of the COVID-19 declaration under Section 564(b)(1) of the Act, 21 U.S.C. section 360bbb-3(b)(1), unless the authorization is terminated or revoked sooner. Performed at [facility], [place] [address] [place],  [address]   Culture, blood (routine x 2)     Status: None (Preliminary result)   Collection Time: 02/05/20 11:05 PM   Specimen: BLOOD LEFT HAND  Result Value Ref Range Status   Specimen Description BLOOD LEFT HAND  Final   Special Requests   Final    BOTTLES DRAWN AEROBIC AND ANAEROBIC Blood Culture adequate volume   Culture   Final    NO GROWTH 2 DAYS  Performed at [facility], [address], [place], [place] [address]    Report Status PENDING  Incomplete  Urine culture     Status: None   Collection Time: 02/06/20  1:48 AM   Specimen: Urine, Random  Result Value Ref Range Status   Specimen Description URINE, RANDOM  Final   Special Requests NONE  Final   Culture   Final    NO GROWTH Performed at [facility], [address] [place], [place] [address]    Report Status 02/06/2020 FINAL  Final  Culture, blood (routine x 2)     Status: None (Preliminary result)   Collection Time: 02/06/20  5:10 AM   Specimen: BLOOD  Result Value Ref Range Status   Specimen Description BLOOD LEFT HAND  Final   Special Requests   Final    BOTTLES DRAWN AEROBIC AND ANAEROBIC Blood Culture adequate volume   Culture   Final    NO GROWTH 1 DAY Performed at [facility], [address] [place], [place] [address]     Report Status PENDING  Incomplete  MRSA PCR Screening     Status: None   Collection Time: 02/06/20  3:47 PM   Specimen: Nasopharyngeal  Result Value Ref Range Status   MRSA by PCR NEGATIVE NEGATIVE Final    Comment:        The GeneXpert MRSA Assay (FDA approved for NASAL specimens only), is one component of a comprehensive MRSA colonization surveillance program. It is not intended to diagnose MRSA infection nor to guide or monitor treatment for MRSA infections. Performed at [facility], [address], [place], [place] [address]   C Difficile Quick Screen w PCR reflex     Status: None   Collection Time: 02/06/20  5:53 PM   Specimen: STOOL  Result Value Ref Range Status   C Diff antigen NEGATIVE NEGATIVE Final   C Diff toxin NEGATIVE NEGATIVE Final   C Diff interpretation No C. difficile detected.  Final    Comment: Performed at [facility], [address] [place], [place] [address]         Radiology Studies: ECHOCARDIOGRAM COMPLETE  Result Date: 02/06/2020    ECHOCARDIOGRAM REPORT   Patient Name:   [facility]   Date of Exam: 02/06/2020 Medical Rec #:  [id number]  Height:       75.0 in Accession #:    [id number] Weight:       247.0 lb Date of Birth:  20-Oct-1945 BSA:          2.400 m Patient Age:    73 years   BP:           101/81 mmHg Patient Gender: M          HR:           99 bpm. Exam Location:  Inpatient Procedure: 2D Echo, Cardiac Doppler and Color Doppler Indications:    Atrial fibrillation  History:        Patient has prior history of Echocardiogram examinations, most                 recent 11/27/2014. CHF, Arrythmias:Atrial Fibrillation and LBBB,                 Signs/Symptoms:Shortness of Breath; Risk Factors:Hypertension.  Sonographer:    Lavenia Atlas Referring Phys: [phone] ARSHAD N KAKRAKANDY IMPRESSIONS  1. Left ventricular ejection fraction, by estimation, is 20 to 25%.  The left ventricle has severely decreased function. The left ventricle demonstrates  global hypokinesis with significant septal dyssynchrony. The left ventricular internal cavity size was severely dilated. There is mild concentric left ventricular hypertrophy.  2. Right ventricular systolic function is moderately reduced. The right ventricular size is moderately enlarged. There is normal pulmonary artery systolic pressure. The estimated right ventricular systolic pressure is 26.2 mmHg.  3. Left atrial size was severely dilated.  4. The mitral valve is normal in structure. Mild mitral valve regurgitation. No evidence of mitral stenosis.  5. The aortic valve is normal in structure. Aortic valve regurgitation is mild. No aortic stenosis is present.  6. The inferior vena cava is normal in size with greater than 50% respiratory variability, suggesting right atrial pressure of 3 mmHg.  7. Right atrial size was mildly dilated. FINDINGS  Left Ventricle: Left ventricular ejection fraction, by estimation, is <20%. The left ventricle has severely decreased function. The left ventricle demonstrates global hypokinesis. The left ventricular internal cavity size was severely dilated. There is mild concentric left ventricular hypertrophy. Abnormal (paradoxical) septal motion, consistent with left bundle branch block. Left ventricular diastolic parameters are consistent with Grade II diastolic dysfunction (pseudonormalization). Elevated left atrial pressure. Right Ventricle: The right ventricular size is moderately enlarged. No increase in right ventricular wall thickness. Right ventricular systolic function is moderately reduced. There is normal pulmonary artery systolic pressure. The tricuspid regurgitant velocity is 2.41 m/s, and with an assumed right atrial pressure of 3 mmHg, the estimated right ventricular systolic pressure is 26.2 mmHg. Left Atrium: Left atrial size was severely dilated. Right Atrium: Right atrial size was mildly dilated. Pericardium: Trivial pericardial effusion is present. There is no  evidence of cardiac tamponade. Mitral Valve: The mitral valve is normal in structure. Normal mobility of the mitral valve leaflets. Mild mitral valve regurgitation, with centrally-directed jet. No evidence of mitral valve stenosis. Tricuspid Valve: The tricuspid valve is normal in structure. Tricuspid valve regurgitation is mild . No evidence of tricuspid stenosis. Aortic Valve: The aortic valve is normal in structure. Aortic valve regurgitation is mild. No aortic stenosis is present. Pulmonic Valve: The pulmonic valve was normal in structure. Pulmonic valve regurgitation is not visualized. No evidence of pulmonic stenosis. Aorta: The aortic root is normal in size and structure. Venous: The inferior vena cava is normal in size with greater than 50% respiratory variability, suggesting right atrial pressure of 3 mmHg. IAS/Shunts: No atrial level shunt detected by color flow Doppler.  LEFT VENTRICLE PLAX 2D LVIDd:         7.20 cm  Diastology LVIDs:         6.50 cm  LV e' lateral:   4.24 cm/s LV PW:         1.10 cm  LV E/e' lateral: 19.0 LV IVS:        1.10 cm  LV e' medial:    4.79 cm/s LVOT diam:     2.70 cm  LV E/e' medial:  16.8 LV SV:         64 LV SV Index:   26 LVOT Area:     5.73 cm  RIGHT VENTRICLE RV Basal diam:  4.20 cm RV S prime:     9.36 cm/s TAPSE (M-mode): 2.5 cm LEFT ATRIUM             Index       RIGHT ATRIUM           Index LA diam:  5.60 cm 2.33 cm/m  RA Area:     19.40 cm LA Vol (A2C):   71.5 ml 29.79 ml/m RA Volume:   48.20 ml  20.08 ml/m LA Vol (A4C):   89.1 ml 37.12 ml/m LA Biplane Vol: 88.0 ml 36.66 ml/m  AORTIC VALVE LVOT Vmax:   69.20 cm/s LVOT Vmean:  43.300 cm/s LVOT VTI:    0.111 m  AORTA Ao Root diam: 3.30 cm MITRAL VALVE               TRICUSPID VALVE MV Area (PHT): 4.80 cm    TR Peak grad:   23.2 mmHg MV Decel Time: 158 msec    TR Vmax:        241.00 cm/s MV E velocity: 80.60 cm/s MV A velocity: 47.60 cm/s  SHUNTS MV E/A ratio:  1.69        Systemic VTI:  0.11 m                             Systemic Diam: 2.70 cm Tobias Alexander MD Electronically signed by Tobias Alexander MD Signature Date/Time: 02/06/2020/4:14:29 PM    Final         Scheduled Meds: . [MAR Hold] apixaban  5 mg Oral BID  . [MAR Hold] clotrimazole-betamethasone  1 application Topical Daily  . [MAR Hold] furosemide  40 mg Oral BID   Continuous Infusions: . sodium chloride    . amiodarone Stopped (02/08/20 1140)     LOS: 2 days    Time spent: 36 minutes spent on chart review, discussion with nursing staff, consultants, updating family and interview/physical exam; more than 50% of that time was spent in counseling and/or coordination of care.    Alvira Philips [place], DO [facility] Available via Epic secure chat 7am-7pm After these hours, please refer to coverage provider listed on [email or web address] 02/08/2020, 11:50 AM

## 2020-02-08 NOTE — Progress Notes (Signed)
Patient via wheelchair to ENDO for TEE

## 2020-02-08 NOTE — Anesthesia Postprocedure Evaluation (Signed)
Anesthesia Post Note  Patient: Bryan Moran  Procedure(s) Performed: TRANSESOPHAGEAL ECHOCARDIOGRAM (TEE) (N/A ) CARDIOVERSION (N/A )     Patient location during evaluation: Endoscopy Anesthesia Type: MAC Level of consciousness: awake and alert Pain management: pain level controlled Vital Signs Assessment: post-procedure vital signs reviewed and stable Respiratory status: spontaneous breathing, nonlabored ventilation, respiratory function stable and patient connected to nasal cannula oxygen Cardiovascular status: stable and blood pressure returned to baseline Postop Assessment: no apparent nausea or vomiting Anesthetic complications: no    Last Vitals:  Vitals:   02/08/20 1230 02/08/20 1541  BP:  97/71  Pulse:  89  Resp:  18  Temp:  36.9 C  SpO2: 99% 100%    Last Pain:  Vitals:   02/08/20 1541  TempSrc: Oral  PainSc:                  [name]

## 2020-02-08 NOTE — CV Procedure (Signed)
Brief TEE Note  LVEF 20-25% Mild AR, PR and TR.  Moderate MR. No LA/LAA thrombus   For additional detail see full report.  Electrical Cardioversion Procedure Note Bryan Moran [id number] [birth year]  Procedure: Electrical Cardioversion Indications:  Atrial Fibrillation  Procedure Details Consent: Risks of procedure as well as the alternatives and risks of each were explained to the (patient/caregiver).  Consent for procedure obtained. Time Out: Verified patient identification, verified procedure, site/side was marked, verified correct patient position, special equipment/implants available, medications/allergies/relevent history reviewed, required imaging and test results available.  Performed  Patient placed on cardiac monitor, pulse oximetry, supplemental oxygen as necessary.  Sedation given: propofol Pacer pads placed anterior and posterior chest.  Cardioverted 1 time(s).  Cardioverted at 150J.  Evaluation Findings: Post procedure EKG shows: Sinus rhythm with frequent PVCs and PACs.  Junctional at times. Complications: None Patient did tolerate procedure well.   Chilton Si, MD 02/08/2020, 11:34 AM

## 2020-02-08 NOTE — Anesthesia Procedure Notes (Signed)
Procedure Name: MAC Date/Time: 02/08/2020 11:02 AM Performed by: Janene Harvey, CRNA Pre-anesthesia Checklist: Patient identified, Emergency Drugs available, Suction available and Patient being monitored Patient Re-evaluated:Patient Re-evaluated prior to induction Oxygen Delivery Method: Nasal cannula Placement Confirmation: positive ETCO2 Dental Injury: Teeth and Oropharynx as per pre-operative assessment

## 2020-02-09 LAB — COMPREHENSIVE METABOLIC PANEL
ALT: 27 U/L (ref 0–44)
AST: 16 U/L (ref 15–41)
Albumin: 2.5 g/dL — ABNORMAL LOW (ref 3.5–5.0)
Alkaline Phosphatase: 49 U/L (ref 38–126)
Anion gap: 9 (ref 5–15)
BUN: 39 mg/dL — ABNORMAL HIGH (ref 8–23)
CO2: 24 mmol/L (ref 22–32)
Calcium: 7.7 mg/dL — ABNORMAL LOW (ref 8.9–10.3)
Chloride: 107 mmol/L (ref 98–111)
Creatinine, Ser: 1.67 mg/dL — ABNORMAL HIGH (ref 0.61–1.24)
GFR calc Af Amer: 47 mL/min — ABNORMAL LOW (ref >60)
GFR calc non Af Amer: 40 mL/min — ABNORMAL LOW (ref >60)
Glucose, Bld: 119 mg/dL — ABNORMAL HIGH (ref 70–99)
Potassium: 3.3 mmol/L — ABNORMAL LOW (ref 3.5–5.1)
Sodium: 140 mmol/L (ref 135–145)
Total Bilirubin: 1.5 mg/dL — ABNORMAL HIGH (ref 0.3–1.2)
Total Protein: 5 g/dL — ABNORMAL LOW (ref 6.5–8.1)

## 2020-02-09 LAB — MAGNESIUM: Magnesium: 2 mg/dL (ref 1.7–2.4)

## 2020-02-09 LAB — CBC
HCT: 46.5 % (ref 39.0–52.0)
Hemoglobin: 15 g/dL (ref 13.0–17.0)
MCH: 31.4 pg (ref 26.0–34.0)
MCHC: 32.3 g/dL (ref 30.0–36.0)
MCV: 97.3 fL (ref 80.0–100.0)
Platelets: 129 10*3/uL — ABNORMAL LOW (ref 150–400)
RBC: 4.78 MIL/uL (ref 4.22–5.81)
RDW: 15.1 % (ref 11.5–15.5)
WBC: 9.2 10*3/uL (ref 4.0–10.5)
nRBC: 0 % (ref 0.0–0.2)

## 2020-02-09 MED ORDER — POTASSIUM CHLORIDE CRYS ER 20 MEQ PO TBCR
20.0000 meq | EXTENDED_RELEASE_TABLET | Freq: Every day | ORAL | Status: DC
  Administered 2020-02-09 – 2020-02-13 (×5): 20 meq via ORAL
  Filled 2020-02-09 (×5): qty 1

## 2020-02-09 MED ORDER — AMIODARONE HCL 200 MG PO TABS
400.0000 mg | ORAL_TABLET | Freq: Two times a day (BID) | ORAL | Status: DC
  Administered 2020-02-09 – 2020-02-13 (×9): 400 mg via ORAL
  Filled 2020-02-09 (×9): qty 2

## 2020-02-09 MED ORDER — POTASSIUM CHLORIDE CRYS ER 20 MEQ PO TBCR
30.0000 meq | EXTENDED_RELEASE_TABLET | ORAL | Status: DC
  Administered 2020-02-09: 30 meq via ORAL
  Filled 2020-02-09: qty 1

## 2020-02-09 MED ORDER — CARVEDILOL 3.125 MG PO TABS
3.1250 mg | ORAL_TABLET | Freq: Two times a day (BID) | ORAL | Status: DC
  Administered 2020-02-09 – 2020-02-13 (×9): 3.125 mg via ORAL
  Filled 2020-02-09 (×9): qty 1

## 2020-02-09 NOTE — Progress Notes (Signed)
Progress Note  Patient Name: Bryan Moran Date of Encounter: 02/09/2020  Primary Cardiologist: Ena Dawley, MD   Subjective   Post TEE/DCC 5/14 going in/out of afib will need more amiodarone   Inpatient Medications    Scheduled Meds: . apixaban  5 mg Oral BID  . clotrimazole-betamethasone  1 application Topical Daily  . furosemide  40 mg Oral BID  . potassium chloride  30 mEq Oral Q3H   Continuous Infusions: . amiodarone Stopped (02/08/20 1140)   PRN Meds: ipratropium-albuterol, metoprolol tartrate, ondansetron **OR** ondansetron (ZOFRAN) IV, polyethylene glycol, senna-docusate, white petrolatum   Vital Signs    Vitals:   02/08/20 2300 02/08/20 2327 02/09/20 0300 02/09/20 0400  BP:  94/77  100/86  Pulse: 88 92 93 93  Resp: 20 18 14 16   Temp:  97.6 F (36.4 C)  (!) 97.5 F (36.4 C)  TempSrc:  Oral  Oral  SpO2: 96% 93% 94% 96%  Weight:    114.4 kg  Height:        Intake/Output Summary (Last 24 hours) at 02/09/2020 0746 Last data filed at 02/09/2020 0400 Gross per 24 hour  Intake 780 ml  Output 1100 ml  Net -320 ml   Last 3 Weights 02/09/2020 02/06/2020 07/27/2018  Weight (lbs) 252 lb 3.3 oz 245 lb 9.5 oz 247 lb  Weight (kg) 114.4 kg 111.4 kg 112.038 kg      Telemetry    Afib HR 110-120, PVCs - Personally Reviewed  ECG    Afib RVR with chronic LBBB, HR 126 on admission - Personally Reviewed  Physical Exam   GEN: No acute distress.   Neck: + JVD Cardiac: Irreg Irreg, no murmurs, rubs, or gallops.  Respiratory: Clear to auscultation bilaterally. GI: Soft, nontender, non-distended  MS: Mild lower leg edema; No deformity. Neuro:  Nonfocal  Psych: Normal affect   Labs    High Sensitivity Troponin:   Recent Labs  Lab 02/05/20 1800 02/05/20 2013 02/05/20 2304 02/06/20 0510  TROPONINIHS 75* 81* 83* 75*      Chemistry Recent Labs  Lab 02/07/20 0217 02/08/20 0243 02/09/20 0205  NA 140 139 140  K 4.2 3.5 3.3*  CL 112* 109 107  CO2 18* 22  24  GLUCOSE 122* 112* 119*  BUN 36* 37* 39*  CREATININE 1.90* 1.81* 1.67*  CALCIUM 8.1* 8.1* 7.7*  PROT 4.9* 5.2* 5.0*  ALBUMIN 2.6* 2.7* 2.5*  AST 19 14* 16  ALT 29 30 27   ALKPHOS 50 52 49  BILITOT 1.5* 1.2 1.5*  GFRNONAA 34* 37* 40*  GFRAA 40* 42* 47*  ANIONGAP 10 8 9      Hematology Recent Labs  Lab 02/07/20 0217 02/08/20 0243 02/09/20 0205  WBC 8.4 7.8 9.2  RBC 4.81 4.87 4.78  HGB 15.3 15.3 15.0  HCT 47.9 47.7 46.5  MCV 99.6 97.9 97.3  MCH 31.8 31.4 31.4  MCHC 31.9 32.1 32.3  RDW 15.4 15.3 15.1  PLT 138* 137* 129*    BNP Recent Labs  Lab 02/05/20 1802  BNP 789.9*     DDimer  Recent Labs  Lab 02/06/20 0640  DDIMER 1.92*     Radiology    ECHO TEE  Result Date: 02/08/2020    TRANSESOPHOGEAL ECHO REPORT   Patient Name:   Bryan Moran   Date of Exam: 02/08/2020 Medical Rec #:  [id number]  Height:       75.0 in Accession #:    [id number] Weight:       245.6  lb Date of Birth:  11-14-1945 BSA:          2.395 m Patient Age:    74 years   BP:           100/65 mmHg Patient Gender: M          HR:           93 bpm. Exam Location:  Inpatient Procedure: Transesophageal Echo, Cardiac Doppler and Color Doppler Indications:     Atrial fibrillation  History:         Patient has prior history of Echocardiogram examinations, most                  recent 02/06/2020. CHF, Arrythmias:Atrial Fibrillation; Risk                  Factors:Hypertension.  Sonographer:     Ross Ludwig RDCS (AE) Referring Phys:  [id number] Wendall Stade Diagnosing Phys: Chilton Si MD PROCEDURE: After discussion of the risks and benefits of a TEE, an informed consent was obtained from the patient. The transesophogeal probe was passed without difficulty through the esophogus of the patient. Local oropharyngeal anesthetic was provided with Cetacaine. Sedation performed by different physician. Image quality was adequate. The patient's vital signs; including heart rate, blood pressure, and oxygen saturation; remained  stable throughout the procedure. The patient developed no complications during the procedure. A successful direct current cardioversion was performed at 150 joules with 1 attempt. IMPRESSIONS  1. Left ventricular ejection fraction, by estimation, is 20 to 25%. The left ventricle has severely decreased function. The left ventricle demonstrates global hypokinesis.  2. Right ventricular systolic function is normal. The right ventricular size is normal. There is normal pulmonary artery systolic pressure.  3. Prominent left atrial appendage trabeculations without evidence of thrombus. No left atrial/left atrial appendage thrombus was detected. The LAA emptying velocity was 28 cm/s.  4. The mitral valve is normal in structure. Moderate mitral valve regurgitation. No evidence of mitral stenosis.  5. The aortic valve is tricuspid. Aortic valve regurgitation is mild. No aortic stenosis is present.  6. The inferior vena cava is normal in size with greater than 50% respiratory variability, suggesting right atrial pressure of 3 mmHg. FINDINGS  Left Ventricle: Left ventricular ejection fraction, by estimation, is 20 to 25%. The left ventricle has severely decreased function. The left ventricle demonstrates global hypokinesis. The left ventricular internal cavity size was normal in size. There is no left ventricular hypertrophy. Right Ventricle: The right ventricular size is normal. No increase in right ventricular wall thickness. Right ventricular systolic function is normal. There is normal pulmonary artery systolic pressure. The tricuspid regurgitant velocity is 2.14 m/s, and  with an assumed right atrial pressure of 10 mmHg, the estimated right ventricular systolic pressure is 28.3 mmHg. Left Atrium: Prominent left atrial appendage trabeculations without evidence of thrombus. Left atrial size was normal in size. No left atrial/left atrial appendage thrombus was detected. The LAA emptying velocity was 28 cm/s. Right Atrium:  Right atrial size was normal in size. Pericardium: There is no evidence of pericardial effusion. Mitral Valve: Multiple mitral valve regurgitant jets. The mitral valve is normal in structure. Normal mobility of the mitral valve leaflets. Moderate mitral valve regurgitation. No evidence of mitral valve stenosis. Tricuspid Valve: The tricuspid valve is normal in structure. Tricuspid valve regurgitation is mild . No evidence of tricuspid stenosis. Aortic Valve: The aortic valve is tricuspid. Aortic valve regurgitation is mild. No aortic stenosis is present.  Pulmonic Valve: The pulmonic valve was normal in structure. Pulmonic valve regurgitation is mild. No evidence of pulmonic stenosis. Aorta: The aortic root is normal in size and structure. Venous: The inferior vena cava is normal in size with greater than 50% respiratory variability, suggesting right atrial pressure of 3 mmHg. IAS/Shunts: No atrial level shunt detected by color flow Doppler.  MR Peak grad:    42.2 mmHg   TRICUSPID VALVE MR Mean grad:    21.0 mmHg   TR Peak grad:   18.3 mmHg MR Vmax:         325.00 cm/s TR Vmax:        214.00 cm/s MR Vmean:        200.0 cm/s MR PISA:         4.02 cm MR PISA Eff ROA: 43 mm MR PISA Radius:  0.80 cm Chilton Si MD Electronically signed by Chilton Si MD Signature Date/Time: 02/08/2020/3:20:37 PM    Final     Cardiac Studies   Echo 02/06/20  1. Left ventricular ejection fraction, by estimation, is 20 to 25%. The  left ventricle has severely decreased function. The left ventricle  demonstrates global hypokinesis with significant septal dyssynchrony. The  left ventricular internal cavity size  was severely dilated. There is mild concentric left ventricular  hypertrophy.   2. Right ventricular systolic function is moderately reduced. The right  ventricular size is moderately enlarged. There is normal pulmonary artery  systolic pressure. The estimated right ventricular systolic pressure is  26.2 mmHg.    3. Left atrial size was severely dilated.   4. The mitral valve is normal in structure. Mild mitral valve  regurgitation. No evidence of mitral stenosis.   5. The aortic valve is normal in structure. Aortic valve regurgitation is  mild. No aortic stenosis is present.   6. The inferior vena cava is normal in size with greater than 50%  respiratory variability, suggesting right atrial pressure of 3 mmHg.   7. Right atrial size was mildly dilated.   Patient Profile     74 y.o. male with a hx of chronic systolic CHF with last LVEF at 25 to 30% per echo in 2016>>followed by Dr. Johney Frame and refused Biv ICD, chronic LBBB, hypertensive heart disease with CHF, paroxysmal/persistent atrial fibrillation previously controlled by amiodarone and no anticoagulation secondary to GI bleed who is being seen today for the evaluation of AF with RVR and CHF.   Assessment & Plan   Paroxysmal Afib with RVR - try to resume low dose coreg start amiodarone 400 bid for a week then 200 bid consider repeat DCC 4 weeks If he does not stay converted continue eliquis   Acute on chronic systolic HF - EF 86-76% in 2016 - repeat echo showed EF 20-25% global hypokinesis with significant septal dyssynchrony - Oral lasix 40 mg BID - seems more euvolemic supplement K 3.3 Cr improved 1.67   Mild MR/mild AR - per echo   For questions or updates, please contact [facility] Please consult [email or web address] for contact info under     May be ready for d/c Monday Needs PT/OT and outpatient referral to cardiac rehab    Signed, Charlton Haws, MD  02/09/2020, 7:46 AM

## 2020-02-09 NOTE — Plan of Care (Signed)

## 2020-02-09 NOTE — Progress Notes (Signed)
PROGRESS NOTE    Bryan Moran  OAC:[id number] DOB: [birth year] DOA: 02/05/2020 PCP: Burton Apley, MD    Brief Narrative:  Bryan Moran is a 74 year old Caucasian male with past medical history remarkable for chronic combined systolic/diastolic congestive heart failure last EF 25-30% with previous refusal of ICD, chronic left bundle branch block, essential hypertension, paroxysmal/persistent atrial fibrillation CKD stage III who presented with progressive shortness of breath, weakness.  In the ER patient was in A. fib with RVR blood pressure was fluctuating between normal to very high and was started on Cardizem infusion.  Due to mildly elevated lactate and low normal blood pressure final cc normal saline bolus was given for which blood pressure has remained stable.  Patient's creatinine has increased from 1.1 about 3 years ago to 1.70.  LFTs are normal abdomen appears benign.  Chest x-ray showing features concerning for left hilar mass with upper lobe density for which CT chest was recommended and CT chest showed bilateral hilar airspace disease for which follow-up was recommended.  Patient is afebrile denies any productive cough fever or chills.  No definite signs of any pneumonia.  BNP was elevated at 789 high sensitive troponin was 75-81 lactic acid was 2.4 going improved to 2.1 CBC was unremarkable.  Mild lower extremity edema.  Stool studies have been ordered.  Patient admitted for A. fib with RVR with shortness of breath which likely could be from CHF.    Assessment & Plan:   Active Problems:   Chronic systolic dysfunction of left ventricle   HTN (hypertension)   Atrial fibrillation with RVR (HCC)   Diarrhea   AKI (acute kidney injury) (HCC)   Acute respiratory failure with hypoxia (HCC)   Acute hypoxic respiratory failure Acute on chronic decompensated combined systolic/diastolic CHF exacerbation Patient presenting with progressive shortness of breath, history of medical noncompliance.   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  BNP elevated 789.  TTE with EF 20-25%, severely decreased LV function with global hypokinesis and septal dyssynchrony, severely dilated LV, RV function mildly reduced, LA severely dilated with normal IVC and grade 2 diastolic dysfunction. --Cardiology following, appreciate assistance --Initially started on Coreg drip but was discontinued secondary to hypotension on 5/12 --Continue furosemide 40 mg p.o.BID --Strict I's and O's and daily weights --Continue supplemental oxygen, maintain SPO2 greater than 92%; on room air w/ SpO2 96%  Paroxysmal/permanent atrial fibrillation with rapid ventricular response Etiology likely secondary to noncompliance with medication regimen.  Previous was on amiodarone and he has stopped for some time now with unclear reason.  Underwent DCCV on 02/08/2020 with initial conversion to NSR but now back in atrial fibrillation. --Amiodarone drip gtt converted to 400mg  BID x 7d, then 200mg  BID --started on coreg 3.125mg  BID today  --Coreg/Cardizem was discontinued 5/12 secondary to hypotension --Continue Eliquis for anticoagulation --Cardiologist can reconsider repeat DCCV in 4 weeks if remains in A. fib --Continue monitor on telemetry  Hypokalemia Potassium 3.3, magnesium 2.0.  Will replete potassium today. --Monitor electrolytes daily  Acute on chronic kidney disease stage IIIa Baseline creatinine 1.3 --Cr 1.73>1.36>1.90>1.81>1.67  --Patient's home lisinopril discontinued --Continues on furosemide 40 mg p.o. twice daily --Continue to monitor renal function closely with diuresis  Generalized weakness, Deconditioning Patient currently lives at home alone, no significant family support close as sister lives 90 miles away in .  Evaluated by PT recommends home health.  OT with no recommendations.  Patchy airspace disease,  right upper  lobe/anterior left hilum Incidental finding on CT chest.  Will need follow-up CT chest in 2-3 months to ensure resolution as underlying neoplastic process cannot be excluded at this time.   DVT prophylaxis: Eliquis Code Status: Full code Family Communication: No family present at bedside  Disposition Plan:  Status is: Inpatient  Remains inpatient appropriate because:Hemodynamically unstable, Ongoing diagnostic testing needed not appropriate for outpatient work up, Unsafe d/c plan and IV treatments appropriate due to intensity of illness or inability to take PO pending TEE/DCCV, heart rate still not controlled, continues on IV amiodarone, patient hypotensive   Dispo: The patient is from: Home              Anticipated d/c is to: Home              Anticipated d/c date is: 2 days              Patient currently is not medically stable to d/c.   Consultants:   Cardiology  Procedures:   TEE/DCCV: 02/08/2020 - Dr. Duke Salvia  Antimicrobials:   None   Subjective: Patient seen and examined bedside, resting comfortably in bedside chair.  Underwent DCCV yesterday with initial conversion to normal sinus rhythm, but now back in atrial fibrillation.  Cardiology transitioned amiodarone drip to oral amiodarone today and started on low-dose carvedilol.  Oxygen now titrated off.  No other complaints or concerns at this time.  Denies headache, no visual changes, no chest pain, no palpitations, no abdominal pain, no cough/congestion, no nausea/vomiting/diarrhea, no fever/chills/night sweats.  Nursing with concerns of hypotension overnight, otherwise no other acute events overnight per nursing staff.  Objective: Vitals:   02/08/20 2327 02/09/20 0300 02/09/20 0400 02/09/20 0808  BP: 94/77  100/86 91/76  Pulse: 92 93 93 99  Resp: Temp: 97.6 F (36.4 C)  (!) 97.5 F (36.4 C) 97.6 F (36.4 C)  TempSrc: Oral  Oral Oral  SpO2: 93% 94% 96% 97%  Weight:   114.4 kg   Height:         Intake/Output Summary (Last 24 hours) at 02/09/2020 1023 Last data filed at 02/09/2020 0810 Gross per 24 hour  Intake 1020 ml  Output 1100 ml  Net -80 ml   Filed Weights   02/06/20 1630 02/09/20 0400  Weight: 111.4 kg 114.4 kg    Examination:  General exam: Appears calm and comfortable  Respiratory system: Clear to auscultation. Respiratory effort normal. On 2L [place] oxygenating 98% Cardiovascular system: Tachycardic, irregularly irregular rhythm. No JVD, murmurs, rubs, gallops or clicks.  1+ pitting edema bilateral lower extremities, right greater than left Gastrointestinal system: Abdomen is nondistended, soft and nontender. No organomegaly or masses felt. Normal bowel sounds heard. Central nervous system: Alert and oriented. No focal neurological deficits. Extremities: Symmetric 5 x 5 power. Skin: No rashes, lesions or ulcers Psychiatry: Judgement and insight appear poor. Mood & affect appropriate.     Data Reviewed: I have personally reviewed following labs and imaging studies  CBC: Recent Labs  Lab 02/05/20 1800 02/06/20 0012 02/07/20 0217 02/08/20 0243 02/09/20 0205  WBC 7.0 7.4 8.4 7.8 9.2  HGB 15.8 15.4 15.3 15.3 15.0  HCT 50.3 46.9 47.9 47.7 46.5  MCV 100.0 98.5 99.6 97.9 97.3  PLT 161 145* 138* 137* 129*   Basic Metabolic Panel: Recent Labs  Lab 02/05/20 1800 02/06/20 0510 02/07/20 0217 02/08/20 0243 02/09/20 0205  NA 143 140 140 139 140  K 4.3 3.8 4.2  3.5 3.3*  CL 108 109 112* 109 107  CO2 24 20* 18* 22 24  GLUCOSE 86 99 122* 112* 119*  BUN 32* 29* 36* 37* 39*  CREATININE 1.73* 1.36* 1.90* 1.81* 1.67*  CALCIUM 8.6* 8.2* 8.1* 8.1* 7.7*  MG 2.5*  --  2.3 2.1 2.0   GFR: Estimated Creatinine Clearance: 54.6 mL/min (A) (by C-G formula based on SCr of 1.67 mg/dL (H)). Liver Function Tests: Recent Labs  Lab 02/05/20 1800 02/06/20 0510 02/07/20 0217 02/08/20 0243 02/09/20 0205  AST 28 21 19  14* 16  ALT 41 35 29 30 27   ALKPHOS 52 53 50 52 49   BILITOT 2.0* 2.7* 1.5* 1.2 1.5*  PROT 5.1* 5.0* 4.9* 5.2* 5.0*  ALBUMIN 2.8* 2.7* 2.6* 2.7* 2.5*   Recent Labs  Lab 02/05/20 1800  LIPASE 28   No results for input(s): AMMONIA in the last 168 hours. Coagulation Profile: No results for input(s): INR, PROTIME in the last 168 hours. Cardiac Enzymes: No results for input(s): CKTOTAL, CKMB, CKMBINDEX, TROPONINI in the last 168 hours. BNP (last 3 results) No results for input(s): PROBNP in the last 8760 hours. HbA1C: No results for input(s): HGBA1C in the last 72 hours. CBG: No results for input(s): GLUCAP in the last 168 hours. Lipid Profile: No results for input(s): CHOL, HDL, LDLCALC, TRIG, CHOLHDL, LDLDIRECT in the last 72 hours. Thyroid Function Tests: No results for input(s): TSH, T4TOTAL, FREET4, T3FREE, THYROIDAB in the last 72 hours. Anemia Panel: No results for input(s): VITAMINB12, FOLATE, FERRITIN, TIBC, IRON, RETICCTPCT in the last 72 hours. Sepsis Labs: Recent Labs  Lab 02/05/20 1802 02/05/20 2013 02/05/20 2304 02/06/20 0510  PROCALCITON  --   --  <0.10  --   LATICACIDVEN 2.4* 2.1*  --  1.3    Recent Results (from the past 240 hour(s))  SARS Coronavirus 2 by RT PCR (hospital order, performed in [facility] hospital lab) Nasopharyngeal Nasopharyngeal Swab     Status: None   Collection Time: 02/05/20  6:36 PM   Specimen: Nasopharyngeal Swab  Result Value Ref Range Status   SARS Coronavirus 2 NEGATIVE NEGATIVE Final    Comment: (NOTE) SARS-CoV-2 target nucleic acids are NOT DETECTED. The SARS-CoV-2 RNA is generally detectable in upper and lower respiratory specimens during the acute phase of infection. The lowest concentration of SARS-CoV-2 viral copies this assay can detect is 250 copies / mL. A negative result does not preclude SARS-CoV-2 infection and should not be used as the sole basis for treatment or other patient management decisions.  A negative result may occur with improper specimen collection /  handling, submission of specimen other than nasopharyngeal swab, presence of viral mutation(s) within the areas targeted by this assay, and inadequate number of viral copies (<250 copies / mL). A negative result must be combined with clinical observations, patient history, and epidemiological information. Fact Sheet for Patients:   StrictlyIdeas.no Fact Sheet for Healthcare Providers: BankingDealers.co.za This test is not yet approved or cleared  by the [place] FDA and has been authorized for detection and/or diagnosis of SARS-CoV-2 by FDA under an Emergency Use Authorization (EUA).  This EUA will remain in effect (meaning this test can be used) for the duration of the COVID-19 declaration under Section 564(b)(1) of the Act, 21 U.S.C. section 360bbb-3(b)(1), unless the authorization is terminated or revoked sooner. Performed at [facility], [place] [address], [place], [place] [address]   Culture, blood (routine x 2)     Status: None (Preliminary  result)   Collection Time: 02/05/20 11:05 PM   Specimen: BLOOD LEFT HAND  Result Value Ref Range Status   Specimen Description BLOOD LEFT HAND  Final   Special Requests   Final    BOTTLES DRAWN AEROBIC AND ANAEROBIC Blood Culture adequate volume   Culture   Final    NO GROWTH 4 DAYS Performed at [facility], [address], [place], [place] [address]    Report Status PENDING  Incomplete  Urine culture     Status: None   Collection Time: 02/06/20  1:48 AM   Specimen: Urine, Random  Result Value Ref Range Status   Specimen Description URINE, RANDOM  Final   Special Requests NONE  Final   Culture   Final    NO GROWTH Performed at [facility], [address] [place], [place] [address]    Report Status 02/06/2020 FINAL  Final  Culture, blood (routine x 2)     Status: None (Preliminary result)   Collection Time: 02/06/20  5:10 AM   Specimen: BLOOD  Result Value Ref  Range Status   Specimen Description BLOOD LEFT HAND  Final   Special Requests   Final    BOTTLES DRAWN AEROBIC AND ANAEROBIC Blood Culture adequate volume   Culture   Final    NO GROWTH 3 DAYS Performed at [facility], [address] [place], [place] [address]    Report Status PENDING  Incomplete  MRSA PCR Screening     Status: None   Collection Time: 02/06/20  3:47 PM   Specimen: Nasopharyngeal  Result Value Ref Range Status   MRSA by PCR NEGATIVE NEGATIVE Final    Comment:        The GeneXpert MRSA Assay (FDA approved for NASAL specimens only), is one component of a comprehensive MRSA colonization surveillance program. It is not intended to diagnose MRSA infection nor to guide or monitor treatment for MRSA infections. Performed at [facility], [address] [place], [place] [address]   C Difficile Quick Screen w PCR reflex     Status: None   Collection Time: 02/06/20  5:53 PM   Specimen: STOOL  Result Value Ref Range Status   C Diff antigen NEGATIVE NEGATIVE Final   C Diff toxin NEGATIVE NEGATIVE Final   C Diff interpretation No C. difficile detected.  Final    Comment: Performed at [facility], [address], [place], [place] [address]         Radiology Studies: ECHO TEE  Result Date: 02/08/2020    TRANSESOPHOGEAL ECHO REPORT   Patient Name:   [facility]   Date of Exam: 02/08/2020 Medical Rec #:  [id number]  Height:       75.0 in Accession #:    [id number] Weight:       245.6 lb Date of Birth:  December 27, 1945 BSA:          2.395 m Patient Age:    73 years   BP:           100/65 mmHg Patient Gender: M          HR:           93 bpm. Exam Location:  Inpatient Procedure: Transesophageal Echo, Cardiac Doppler and Color Doppler Indications:     Atrial fibrillation  History:         Patient has prior history of Echocardiogram examinations, most  recent 02/06/2020. CHF, Arrythmias:Atrial Fibrillation; Risk                  Factors:Hypertension.   Sonographer:     Ross Ludwig RDCS (AE) Referring Phys:  [id number] Stade Diagnosing Phys: Chilton Si MD PROCEDURE: After discussion of the risks and benefits of a TEE, an informed consent was obtained from the patient. The transesophogeal probe was passed without difficulty through the esophogus of the patient. Local oropharyngeal anesthetic was provided with Cetacaine. Sedation performed by different physician. Image quality was adequate. The patient's vital signs; including heart rate, blood pressure, and oxygen saturation; remained stable throughout the procedure. The patient developed no complications during the procedure. A successful direct current cardioversion was performed at 150 joules with 1 attempt. IMPRESSIONS  1. Left ventricular ejection fraction, by estimation, is 20 to 25%. The left ventricle has severely decreased function. The left ventricle demonstrates global hypokinesis.  2. Right ventricular systolic function is normal. The right ventricular size is normal. There is normal pulmonary artery systolic pressure.  3. Prominent left atrial appendage trabeculations without evidence of thrombus. No left atrial/left atrial appendage thrombus was detected. The LAA emptying velocity was 28 cm/s.  4. The mitral valve is normal in structure. Moderate mitral valve regurgitation. No evidence of mitral stenosis.  5. The aortic valve is tricuspid. Aortic valve regurgitation is mild. No aortic stenosis is present.  6. The inferior vena cava is normal in size with greater than 50% respiratory variability, suggesting right atrial pressure of 3 mmHg. FINDINGS  Left Ventricle: Left ventricular ejection fraction, by estimation, is 20 to 25%. The left ventricle has severely decreased function. The left ventricle demonstrates global hypokinesis. The left ventricular internal cavity size was normal in size. There is no left ventricular hypertrophy. Right Ventricle: The right ventricular size is normal. No  increase in right ventricular wall thickness. Right ventricular systolic function is normal. There is normal pulmonary artery systolic pressure. The tricuspid regurgitant velocity is 2.14 m/s, and  with an assumed right atrial pressure of 10 mmHg, the estimated right ventricular systolic pressure is 28.3 mmHg. Left Atrium: Prominent left atrial appendage trabeculations without evidence of thrombus. Left atrial size was normal in size. No left atrial/left atrial appendage thrombus was detected. The LAA emptying velocity was 28 cm/s. Right Atrium: Right atrial size was normal in size. Pericardium: There is no evidence of pericardial effusion. Mitral Valve: Multiple mitral valve regurgitant jets. The mitral valve is normal in structure. Normal mobility of the mitral valve leaflets. Moderate mitral valve regurgitation. No evidence of mitral valve stenosis. Tricuspid Valve: The tricuspid valve is normal in structure. Tricuspid valve regurgitation is mild . No evidence of tricuspid stenosis. Aortic Valve: The aortic valve is tricuspid. Aortic valve regurgitation is mild. No aortic stenosis is present. Pulmonic Valve: The pulmonic valve was normal in structure. Pulmonic valve regurgitation is mild. No evidence of pulmonic stenosis. Aorta: The aortic root is normal in size and structure. Venous: The inferior vena cava is normal in size with greater than 50% respiratory variability, suggesting right atrial pressure of 3 mmHg. IAS/Shunts: No atrial level shunt detected by color flow Doppler.  MR Peak grad:    42.2 mmHg   TRICUSPID VALVE MR Mean grad:    21.0 mmHg   TR Peak grad:   18.3 mmHg MR Vmax:         325.00 cm/s TR Vmax:        214.00 cm/s MR Vmean:  200.0 cm/s MR PISA:         4.02 cm MR PISA Eff ROA: 43 mm MR PISA Radius:  0.80 cm Chilton Si MD Electronically signed by Chilton Si MD Signature Date/Time: 02/08/2020/3:20:37 PM    Final         Scheduled Meds: . amiodarone  400 mg Oral BID  .  apixaban  5 mg Oral BID  . carvedilol  3.125 mg Oral BID WC  . clotrimazole-betamethasone  1 application Topical Daily  . furosemide  40 mg Oral BID  . potassium chloride  20 mEq Oral Daily   Continuous Infusions:    LOS: 3 days    Time spent: 36 minutes spent on chart review, discussion with nursing staff, consultants, updating family and interview/physical exam; more than 50% of that time was spent in counseling and/or coordination of care.    Alvira Philips [place], DO [facility] Available via Epic secure chat 7am-7pm After these hours, please refer to coverage provider listed on [email or web address] 02/09/2020, 10:23 AM

## 2020-02-09 NOTE — Progress Notes (Signed)
Sent text page to dr [place]---Bryan Moran has been sitting in chair--all PO bp meds given at 9:15, bp reading 224/172 x2 on 2 different arms---will do manual bp reading and send text page to dr [place]

## 2020-02-09 NOTE — Progress Notes (Signed)
Physical Therapy Treatment Patient Details Name: Bryan Moran MRN: [id number] DOB: 09/20/1946 Today's Date: 02/09/2020    History of Present Illness Pt adm with SOB and weakness and found to have a-fib with rvr. PMH - chf, htn, ckd    PT Comments    Pt making steady progress overall with functional mobility as indicated by his ability to ambulate a further distance and with improved stability with gait. PT recommended pt use the RW he has at home upon d/c. Pt agreeable. He was asymptomatic throughout ambulation. Pt on RA with SpO2 maintaining >96% and HR fluctuating between 105-128 bpm throughout and BP was 115/71 mmHg.   Pt would continue to benefit from skilled physical therapy services at this time while admitted and after d/c to address the below listed limitations in order to improve overall safety and independence with functional mobility.   Follow Up Recommendations  Home health PT;Supervision - Intermittent     Equipment Recommendations  None recommended by PT;Other (comment)(pt reported having a RW at home)    Recommendations for Other Services       Precautions / Restrictions Precautions Precautions: Fall Precaution Comments: monitor HR Restrictions Weight Bearing Restrictions: No    Mobility  Bed Mobility Overal bed mobility: Needs Assistance Bed Mobility: Supine to Sit     Supine to sit: Min assist     General bed mobility comments: min A for trunk elevation  Transfers Overall transfer level: Needs assistance Equipment used: Rolling walker (2 wheeled) Transfers: Sit to/from Stand Sit to Stand: Min guard         General transfer comment: min guard for increased time and effort  Ambulation/Gait Ambulation/Gait assistance: Min guard Gait Distance (Feet): 200 Feet Assistive device: Rolling walker (2 wheeled) Gait Pattern/deviations: Step-through pattern Gait velocity: decr   General Gait Details: pt with greatly improved stability with gait with use  of RW; no LOB or need for physical assistance; HR flucutating between 105-128 bpm throughout   Stairs             Wheelchair Mobility    Modified Rankin (Stroke Patients Only)       Balance Overall balance assessment: Needs assistance Sitting-balance support: No upper extremity supported;Feet supported Sitting balance-Leahy Scale: Good     Standing balance support: Bilateral upper extremity supported;During functional activity Standing balance-Leahy Scale: Poor                              Cognition Arousal/Alertness: Awake/alert Behavior During Therapy: WFL for tasks assessed/performed Overall Cognitive Status: Within Functional Limits for tasks assessed                                        Exercises      General Comments        Pertinent Vitals/Pain Pain Assessment: No/denies pain    Home Living                      Prior Function            PT Goals (current goals can now be found in the care plan section) Acute Rehab PT Goals PT Goal Formulation: With patient Time For Goal Achievement: 02/21/20 Potential to Achieve Goals: Good Progress towards PT goals: Progressing toward goals    Frequency    Min 3X/week  PT Plan Current plan remains appropriate    Co-evaluation              AM-PAC PT "6 Clicks" Mobility   Outcome Measure  Help needed turning from your back to your side while in a flat bed without using bedrails?: None Help needed moving from lying on your back to sitting on the side of a flat bed without using bedrails?: None Help needed moving to and from a bed to a chair (including a wheelchair)?: None Help needed standing up from a chair using your arms (e.g., wheelchair or bedside chair)?: None Help needed to walk in hospital room?: A Little Help needed climbing 3-5 steps with a railing? : A Little 6 Click Score: 22    End of Session Equipment Utilized During Treatment: Gait  belt Activity Tolerance: Patient tolerated treatment well Patient left: in chair;with call bell/phone within reach Nurse Communication: Mobility status PT Visit Diagnosis: Unsteadiness on feet (R26.81);Muscle weakness (generalized) (M62.81)     Time: 9371-6967 PT Time Calculation (min) (ACUTE ONLY): 23 min  Charges:  $Gait Training: 23-37 mins                     Anastasio Champion, DPT  Acute Rehabilitation Services Pager [phone] Office [place] 02/09/2020, 11:39 AM

## 2020-02-09 NOTE — Plan of Care (Signed)

## 2020-02-10 LAB — BASIC METABOLIC PANEL
Anion gap: 9 (ref 5–15)
BUN: 33 mg/dL — ABNORMAL HIGH (ref 8–23)
CO2: 23 mmol/L (ref 22–32)
Calcium: 7.8 mg/dL — ABNORMAL LOW (ref 8.9–10.3)
Chloride: 108 mmol/L (ref 98–111)
Creatinine, Ser: 1.47 mg/dL — ABNORMAL HIGH (ref 0.61–1.24)
GFR calc Af Amer: 54 mL/min — ABNORMAL LOW (ref >60)
GFR calc non Af Amer: 47 mL/min — ABNORMAL LOW (ref >60)
Glucose, Bld: 103 mg/dL — ABNORMAL HIGH (ref 70–99)
Potassium: 4 mmol/L (ref 3.5–5.1)
Sodium: 140 mmol/L (ref 135–145)

## 2020-02-10 LAB — CBC
HCT: 46.4 % (ref 39.0–52.0)
Hemoglobin: 15 g/dL (ref 13.0–17.0)
MCH: 31.7 pg (ref 26.0–34.0)
MCHC: 32.3 g/dL (ref 30.0–36.0)
MCV: 98.1 fL (ref 80.0–100.0)
Platelets: 129 10*3/uL — ABNORMAL LOW (ref 150–400)
RBC: 4.73 MIL/uL (ref 4.22–5.81)
RDW: 15.2 % (ref 11.5–15.5)
WBC: 6.9 10*3/uL (ref 4.0–10.5)
nRBC: 0 % (ref 0.0–0.2)

## 2020-02-10 LAB — CULTURE, BLOOD (ROUTINE X 2)
Culture: NO GROWTH
Special Requests: ADEQUATE

## 2020-02-10 LAB — MAGNESIUM: Magnesium: 1.9 mg/dL (ref 1.7–2.4)

## 2020-02-10 NOTE — Progress Notes (Signed)
PROGRESS NOTE    Bryan Moran  THY:[id number] DOB: 05-15-1946 DOA: 02/05/2020 PCP: Burton Apley, MD    Brief Narrative:  Bryan Moran is a 74 year old Caucasian male with past medical history remarkable for chronic combined systolic/diastolic congestive heart failure last EF 25-30% with previous refusal of ICD, chronic left bundle branch block, essential hypertension, paroxysmal/persistent atrial fibrillation CKD stage III who presented with progressive shortness of breath, weakness.  In the ER patient was in A. fib with RVR blood pressure was fluctuating between normal to very high and was started on Cardizem infusion.  Due to mildly elevated lactate and low normal blood pressure final cc normal saline bolus was given for which blood pressure has remained stable.  Patient's creatinine has increased from 1.1 about 3 years ago to 1.70.  LFTs are normal abdomen appears benign.  Chest x-ray showing features concerning for left hilar mass with upper lobe density for which CT chest was recommended and CT chest showed bilateral hilar airspace disease for which follow-up was recommended.  Patient is afebrile denies any productive cough fever or chills.  No definite signs of any pneumonia.  BNP was elevated at 789 high sensitive troponin was 75-81 lactic acid was 2.4 going improved to 2.1 CBC was unremarkable.  Mild lower extremity edema.  Stool studies have been ordered.  Patient admitted for A. fib with RVR with shortness of breath which likely could be from CHF.    Assessment & Plan:   Active Problems:   Chronic systolic dysfunction of left ventricle   HTN (hypertension)   Atrial fibrillation with RVR (HCC)   Diarrhea   AKI (acute kidney injury) (HCC)   Acute respiratory failure with hypoxia (HCC)   Acute hypoxic respiratory failure Acute on chronic decompensated combined systolic/diastolic CHF exacerbation Patient presenting with progressive shortness of breath, history of medical noncompliance.   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  BNP elevated 789.  TTE with EF 20-25%, severely decreased LV function with global hypokinesis and septal dyssynchrony, severely dilated LV, RV function mildly reduced, LA severely dilated with normal IVC and grade 2 diastolic dysfunction. --Cardiology following, appreciate assistance --[name] 3.125 mg p.o. twice daily --Continue furosemide 40 mg p.o.BID --Strict I's and O's and daily weights  Paroxysmal/permanent atrial fibrillation with rapid ventricular response Etiology likely secondary to noncompliance with medication regimen.  Previous was on amiodarone and he has stopped for some time now with unclear reason.  Underwent DCCV on 02/08/2020 with initial conversion to NSR but now back in atrial fibrillation. --Amiodarone drip gtt converted to 400mg  BID x 7d, then 200mg  BID --Coreg 3.125mg  BID   --Continue Eliquis for anticoagulation --Cardiology can reconsider repeat DCCV in 4 weeks if remains in A. Fib; consider EP evaluation for CRT with wide LBBB if repeat DCCV failed --Continue monitor on telemetry  Hypokalemia Potassium 4.0 --Monitor electrolytes daily  Acute on chronic kidney disease stage IIIa Baseline creatinine 1.3 --Cr 1.73>1.36>1.90>1.81>1.67>1.47 --Patient's home lisinopril discontinued --Continue furosemide 40 mg p.o. twice daily --Continue to monitor renal function closely with diuresis  Generalized weakness, Deconditioning Patient currently lives at home alone, no significant family support close as sister lives 90 miles away in .  Evaluated by PT recommends home health.  OT with no recommendations.  Patchy airspace disease, right upper lobe/anterior left hilum Incidental finding on CT chest.  Will need follow-up CT chest in 2-3 months to ensure resolution as underlying neoplastic process cannot  be excluded at this time.   DVT  prophylaxis: Eliquis Code Status: Full code Family Communication: No family present at bedside  Disposition Plan:  Status is: Inpatient  Remains inpatient appropriate because:Hemodynamically unstable, Ongoing diagnostic testing needed not appropriate for outpatient work up, Unsafe d/c plan and IV treatments appropriate due to intensity of illness or inability to take PO    Dispo: The patient is from: Home              Anticipated d/c is to: Home              Anticipated d/c date is: 1 day              Patient currently is not medically stable to d/c.   Consultants:   Cardiology  Procedures:   TEE/DCCV: 02/08/2020 - Dr. Duke Salvia  Antimicrobials:   None   Subjective: Patient seen and examined bedside, resting comfortably in bedside chair.  Rate is now better controlled in the 80s-90s.  Continues in atrial fibrillation despite attempts at DCCV 2 days ago.  Patient without any complaints or concerns at this time.  Denies headache, no visual changes, no chest pain, no palpitations, no abdominal pain, no cough/congestion, no nausea/vomiting/diarrhea, no fever/chills/night sweats.  Nursing with concerns of hypotension overnight, otherwise no other acute events overnight per nursing staff.  Objective: Vitals:   02/10/20 0600 02/10/20 0652 02/10/20 0801 02/10/20 1025  BP: 103/74 104/87 96/71 110/89  Pulse: 89 94 99 81  Resp: (!) 24  20 (!) 21  Temp:   97.6 F (36.4 C) 97.6 F (36.4 C)  TempSrc:   Oral Oral  SpO2: 96%  100% 97%  Weight:      Height:        Intake/Output Summary (Last 24 hours) at 02/10/2020 1034 Last data filed at 02/10/2020 0804 Gross per 24 hour  Intake 723.75 ml  Output 650 ml  Net 73.75 ml   Filed Weights   02/06/20 1630 02/09/20 0400  Weight: 111.4 kg 114.4 kg    Examination:  General exam: Appears calm and comfortable  Respiratory system: Clear to auscultation. Respiratory effort normal.  Oxygenating well on room air Cardiovascular system:  irregularly irregular rhythm, normal rate. No JVD, murmurs, rubs, gallops or clicks.  1+ pitting edema bilateral lower extremities, right greater than left Gastrointestinal system: Abdomen is nondistended, soft and nontender. No organomegaly or masses felt. Normal bowel sounds heard. Central nervous system: Alert and oriented. No focal neurological deficits. Extremities: Symmetric 5 x 5 power. Skin: No rashes, lesions or ulcers Psychiatry: Judgement and insight appear poor. Mood & affect appropriate.     Data Reviewed: I have personally reviewed following labs and imaging studies  CBC: Recent Labs  Lab 02/06/20 0012 02/07/20 0217 02/08/20 0243 02/09/20 0205 02/10/20 0153  WBC 7.4 8.4 7.8 9.2 6.9  HGB 15.4 15.3 15.3 15.0 15.0  HCT 46.9 47.9 47.7 46.5 46.4  MCV 98.5 99.6 97.9 97.3 98.1  PLT 145* 138* 137* 129* 129*   Basic Metabolic Panel: Recent Labs  Lab 02/05/20 1800 02/05/20 1800 02/06/20 0510 02/07/20 0217 02/08/20 0243 02/09/20 0205 02/10/20 0153  NA 143   < > 140 140 139 140 140  K 4.3   < > 3.8 4.2 3.5 3.3* 4.0  CL 108   < > 109 112* 109 107 108  CO2 24   < > 20* 18* GLUCOSE 86   < > 99 122* 112* 119* 103*  BUN  32*   < > 29* 36* 37* 39* 33*  CREATININE 1.73*   < > 1.36* 1.90* 1.81* 1.67* 1.47*  CALCIUM 8.6*   < > 8.2* 8.1* 8.1* 7.7* 7.8*  MG 2.5*  --   --  2.3 2.1 2.0 1.9   < > = values in this interval not displayed.   GFR: Estimated Creatinine Clearance: 62 mL/min (A) (by C-G formula based on SCr of 1.47 mg/dL (H)). Liver Function Tests: Recent Labs  Lab 02/05/20 1800 02/06/20 0510 02/07/20 0217 02/08/20 0243 02/09/20 0205  AST 28 21 19  14* 16  ALT 41 35 29 30 27   ALKPHOS 52 53 50 52 49  BILITOT 2.0* 2.7* 1.5* 1.2 1.5*  PROT 5.1* 5.0* 4.9* 5.2* 5.0*  ALBUMIN 2.8* 2.7* 2.6* 2.7* 2.5*   Recent Labs  Lab 02/05/20 1800  LIPASE 28   No results for input(s): AMMONIA in the last 168 hours. Coagulation Profile: No results for input(s):  INR, PROTIME in the last 168 hours. Cardiac Enzymes: No results for input(s): CKTOTAL, CKMB, CKMBINDEX, TROPONINI in the last 168 hours. BNP (last 3 results) No results for input(s): PROBNP in the last 8760 hours. HbA1C: No results for input(s): HGBA1C in the last 72 hours. CBG: No results for input(s): GLUCAP in the last 168 hours. Lipid Profile: No results for input(s): CHOL, HDL, LDLCALC, TRIG, CHOLHDL, LDLDIRECT in the last 72 hours. Thyroid Function Tests: No results for input(s): TSH, T4TOTAL, FREET4, T3FREE, THYROIDAB in the last 72 hours. Anemia Panel: No results for input(s): VITAMINB12, FOLATE, FERRITIN, TIBC, IRON, RETICCTPCT in the last 72 hours. Sepsis Labs: Recent Labs  Lab 02/05/20 1802 02/05/20 2013 02/05/20 2304 02/06/20 0510  PROCALCITON  --   --  <0.10  --   LATICACIDVEN 2.4* 2.1*  --  1.3    Recent Results (from the past 240 hour(s))  SARS Coronavirus 2 by RT PCR (hospital order, performed in [facility] hospital lab) Nasopharyngeal Nasopharyngeal Swab     Status: None   Collection Time: 02/05/20  6:36 PM   Specimen: Nasopharyngeal Swab  Result Value Ref Range Status   SARS Coronavirus 2 NEGATIVE NEGATIVE Final    Comment: (NOTE) SARS-CoV-2 target nucleic acids are NOT DETECTED. The SARS-CoV-2 RNA is generally detectable in upper and lower respiratory specimens during the acute phase of infection. The lowest concentration of SARS-CoV-2 viral copies this assay can detect is 250 copies / mL. A negative result does not preclude SARS-CoV-2 infection and should not be used as the sole basis for treatment or other patient management decisions.  A negative result may occur with improper specimen collection / handling, submission of specimen other than nasopharyngeal swab, presence of viral mutation(s) within the areas targeted by this assay, and inadequate number of viral copies (<250 copies / mL). A negative result must be combined with clinical observations,  patient history, and epidemiological information. Fact Sheet for Patients:   BoilerBrush.com.cy Fact Sheet for Healthcare Providers: https://pope.com/ This test is not yet approved or cleared  by the [place] FDA and has been authorized for detection and/or diagnosis of SARS-CoV-2 by FDA under an Emergency Use Authorization (EUA).  This EUA will remain in effect (meaning this test can be used) for the duration of the COVID-19 declaration under Section 564(b)(1) of the Act, 21 U.S.C. section 360bbb-3(b)(1), unless the authorization is terminated or revoked sooner. Performed at [facility], [address], [place], [place] [address]   Culture, blood (routine x 2)  Status: None   Collection Time: 02/05/20 11:05 PM   Specimen: BLOOD LEFT HAND  Result Value Ref Range Status   Specimen Description BLOOD LEFT HAND  Final   Special Requests   Final    BOTTLES DRAWN AEROBIC AND ANAEROBIC Blood Culture adequate volume   Culture   Final    NO GROWTH 5 DAYS Performed at [facility], [address] [place], [place] [address]    Report Status 02/10/2020 FINAL  Final  Urine culture     Status: None   Collection Time: 02/06/20  1:48 AM   Specimen: Urine, Random  Result Value Ref Range Status   Specimen Description URINE, RANDOM  Final   Special Requests NONE  Final   Culture   Final    NO GROWTH Performed at [facility], [address] [place], [place] [address]    Report Status 02/06/2020 FINAL  Final  Culture, blood (routine x 2)     Status: None (Preliminary result)   Collection Time: 02/06/20  5:10 AM   Specimen: BLOOD  Result Value Ref Range Status   Specimen Description BLOOD LEFT HAND  Final   Special Requests   Final    BOTTLES DRAWN AEROBIC AND ANAEROBIC Blood Culture adequate volume   Culture   Final    NO GROWTH 4 DAYS Performed at [facility], [address] [place], [place] [address]     Report Status PENDING  Incomplete  MRSA PCR Screening     Status: None   Collection Time: 02/06/20  3:47 PM   Specimen: Nasopharyngeal  Result Value Ref Range Status   MRSA by PCR NEGATIVE NEGATIVE Final    Comment:        The GeneXpert MRSA Assay (FDA approved for NASAL specimens only), is one component of a comprehensive MRSA colonization surveillance program. It is not intended to diagnose MRSA infection nor to guide or monitor treatment for MRSA infections. Performed at [facility], [address], [place], [place] [address]   C Difficile Quick Screen w PCR reflex     Status: None   Collection Time: 02/06/20  5:53 PM   Specimen: STOOL  Result Value Ref Range Status   C Diff antigen NEGATIVE NEGATIVE Final   C Diff toxin NEGATIVE NEGATIVE Final   C Diff interpretation No C. difficile detected.  Final    Comment: Performed at [facility], [address], [place], [place] [address]         Radiology Studies: ECHO TEE  Result Date: 02/08/2020    TRANSESOPHOGEAL ECHO REPORT   Patient Name:   [facility]   Date of Exam: 02/08/2020 Medical Rec #:  [id number]  Height:       75.0 in Accession #:    [id number] Weight:       245.6 lb Date of Birth:  05-05-1946 BSA:          2.395 m Patient Age:    73 years   BP:           100/65 mmHg Patient Gender: M          HR:           93 bpm. Exam Location:  Inpatient Procedure: Transesophageal Echo, Cardiac Doppler and Color Doppler Indications:     Atrial fibrillation  History:         Patient has prior history of Echocardiogram examinations, most  recent 02/06/2020. CHF, Arrythmias:Atrial Fibrillation; Risk                  Factors:Hypertension.  Sonographer:     Ross Ludwig RDCS (AE) Referring Phys:  [id number] Stade Diagnosing Phys: Chilton Si MD PROCEDURE: After discussion of the risks and benefits of a TEE, an informed consent was obtained from the patient. The transesophogeal probe was passed without  difficulty through the esophogus of the patient. Local oropharyngeal anesthetic was provided with Cetacaine. Sedation performed by different physician. Image quality was adequate. The patient's vital signs; including heart rate, blood pressure, and oxygen saturation; remained stable throughout the procedure. The patient developed no complications during the procedure. A successful direct current cardioversion was performed at 150 joules with 1 attempt. IMPRESSIONS  1. Left ventricular ejection fraction, by estimation, is 20 to 25%. The left ventricle has severely decreased function. The left ventricle demonstrates global hypokinesis.  2. Right ventricular systolic function is normal. The right ventricular size is normal. There is normal pulmonary artery systolic pressure.  3. Prominent left atrial appendage trabeculations without evidence of thrombus. No left atrial/left atrial appendage thrombus was detected. The LAA emptying velocity was 28 cm/s.  4. The mitral valve is normal in structure. Moderate mitral valve regurgitation. No evidence of mitral stenosis.  5. The aortic valve is tricuspid. Aortic valve regurgitation is mild. No aortic stenosis is present.  6. The inferior vena cava is normal in size with greater than 50% respiratory variability, suggesting right atrial pressure of 3 mmHg. FINDINGS  Left Ventricle: Left ventricular ejection fraction, by estimation, is 20 to 25%. The left ventricle has severely decreased function. The left ventricle demonstrates global hypokinesis. The left ventricular internal cavity size was normal in size. There is no left ventricular hypertrophy. Right Ventricle: The right ventricular size is normal. No increase in right ventricular wall thickness. Right ventricular systolic function is normal. There is normal pulmonary artery systolic pressure. The tricuspid regurgitant velocity is 2.14 m/s, and  with an assumed right atrial pressure of 10 mmHg, the estimated right  ventricular systolic pressure is 28.3 mmHg. Left Atrium: Prominent left atrial appendage trabeculations without evidence of thrombus. Left atrial size was normal in size. No left atrial/left atrial appendage thrombus was detected. The LAA emptying velocity was 28 cm/s. Right Atrium: Right atrial size was normal in size. Pericardium: There is no evidence of pericardial effusion. Mitral Valve: Multiple mitral valve regurgitant jets. The mitral valve is normal in structure. Normal mobility of the mitral valve leaflets. Moderate mitral valve regurgitation. No evidence of mitral valve stenosis. Tricuspid Valve: The tricuspid valve is normal in structure. Tricuspid valve regurgitation is mild . No evidence of tricuspid stenosis. Aortic Valve: The aortic valve is tricuspid. Aortic valve regurgitation is mild. No aortic stenosis is present. Pulmonic Valve: The pulmonic valve was normal in structure. Pulmonic valve regurgitation is mild. No evidence of pulmonic stenosis. Aorta: The aortic root is normal in size and structure. Venous: The inferior vena cava is normal in size with greater than 50% respiratory variability, suggesting right atrial pressure of 3 mmHg. IAS/Shunts: No atrial level shunt detected by color flow Doppler.  MR Peak grad:    42.2 mmHg   TRICUSPID VALVE MR Mean grad:    21.0 mmHg   TR Peak grad:   18.3 mmHg MR Vmax:         325.00 cm/s TR Vmax:        214.00 cm/s MR Vmean:  200.0 cm/s MR PISA:         4.02 cm MR PISA Eff ROA: 43 mm MR PISA Radius:  0.80 cm Skeet Latch MD Electronically signed by Skeet Latch MD Signature Date/Time: 02/08/2020/3:20:37 PM    Final         Scheduled Meds: . amiodarone  400 mg Oral BID  . apixaban  5 mg Oral BID  . carvedilol  3.125 mg Oral BID WC  . clotrimazole-betamethasone  1 application Topical Daily  . furosemide  40 mg Oral BID  . potassium chloride  20 mEq Oral Daily   Continuous Infusions:    LOS: 4 days    Time spent: 34 minutes  spent on chart review, discussion with nursing staff, consultants, updating family and interview/physical exam; more than 50% of that time was spent in counseling and/or coordination of care.    Krupa Stege J [place] DO [facility] Available via Epic secure chat 7am-7pm After these hours, please refer to coverage provider listed on [email or web address] 02/10/2020, 10:34 AM

## 2020-02-10 NOTE — Progress Notes (Signed)
Progress Note  Patient Name: Bryan Moran Date of Encounter: 02/10/2020  Primary Cardiologist: Ena Dawley, MD   Subjective   Post TEE/DCC 5/14 with reversion to afib next day   Inpatient Medications    Scheduled Meds: . amiodarone  400 mg Oral BID  . apixaban  5 mg Oral BID  . carvedilol  3.125 mg Oral BID WC  . clotrimazole-betamethasone  1 application Topical Daily  . furosemide  40 mg Oral BID  . potassium chloride  20 mEq Oral Daily   Continuous Infusions:  PRN Meds: ipratropium-albuterol, ondansetron **OR** ondansetron (ZOFRAN) IV, polyethylene glycol, senna-docusate, white petrolatum   Vital Signs    Vitals:   02/10/20 0400 02/10/20 0600 02/10/20 0652 02/10/20 0801  BP: 117/72 103/74 104/87 96/71  Pulse: 98 89 94 99  Resp: (!) 21 (!) 24  20  Temp: 98.3 F (36.8 C)   97.6 F (36.4 C)  TempSrc: Oral   Oral  SpO2: 93% 96%  100%  Weight:      Height:        Intake/Output Summary (Last 24 hours) at 02/10/2020 0809 Last data filed at 02/10/2020 0804 Gross per 24 hour  Intake 963.75 ml  Output 650 ml  Net 313.75 ml   Last 3 Weights 02/09/2020 02/06/2020 07/27/2018  Weight (lbs) 252 lb 3.3 oz 245 lb 9.5 oz 247 lb  Weight (kg) 114.4 kg 111.4 kg 112.038 kg      Telemetry    Afib HR 110-120, PVCs - Personally Reviewed  ECG    Afib RVR with chronic LBBB, HR 126 on admission - Personally Reviewed  Physical Exam   GEN: No acute distress.   Neck: + JVD Cardiac: Irreg Irreg, no murmurs, rubs, or gallops.  Respiratory: Clear to auscultation bilaterally. GI: Soft, nontender, non-distended  MS: Mild lower leg edema; No deformity. Neuro:  Nonfocal  Psych: Normal affect   Labs    High Sensitivity Troponin:   Recent Labs  Lab 02/05/20 1800 02/05/20 2013 02/05/20 2304 02/06/20 0510  TROPONINIHS 75* 81* 83* 75*      Chemistry Recent Labs  Lab 02/07/20 0217 02/07/20 0217 02/08/20 0243 02/09/20 0205 02/10/20 0153  NA 140   < > 139 140 140  K  4.2   < > 3.5 3.3* 4.0  CL 112*   < > 109 107 108  CO2 18*   < > 22 24 23   GLUCOSE 122*   < > 112* 119* 103*  BUN 36*   < > 37* 39* 33*  CREATININE 1.90*   < > 1.81* 1.67* 1.47*  CALCIUM 8.1*   < > 8.1* 7.7* 7.8*  PROT 4.9*  --  5.2* 5.0*  --   ALBUMIN 2.6*  --  2.7* 2.5*  --   AST 19  --  14* 16  --   ALT 29  --  30 27  --   ALKPHOS 50  --  52 49  --   BILITOT 1.5*  --  1.2 1.5*  --   GFRNONAA 34*   < > 37* 40* 47*  GFRAA 40*   < > 42* 47* 54*  ANIONGAP 10   < > 8 9 9    < > = values in this interval not displayed.     Hematology Recent Labs  Lab 02/08/20 0243 02/09/20 0205 02/10/20 0153  WBC 7.8 9.2 6.9  RBC 4.87 4.78 4.73  HGB 15.3 15.0 15.0  HCT 47.7 46.5 46.4  MCV 97.9  97.3 98.1  MCH 31.4 31.4 31.7  MCHC 32.1 32.3 32.3  RDW 15.3 15.1 15.2  PLT 137* 129* 129*    BNP Recent Labs  Lab 02/05/20 1802  BNP 789.9*     DDimer  Recent Labs  Lab 02/06/20 0640  DDIMER 1.92*     Radiology    ECHO TEE  Result Date: 02/08/2020    TRANSESOPHOGEAL ECHO REPORT   Patient Name:   Bryan Moran   Date of Exam: 02/08/2020 Medical Rec #:  [id number]  Height:       75.0 in Accession #:    [id number] Weight:       245.6 lb Date of Birth:  [birth year] BSA:          2.395 m Patient Age:    73 years   BP:           100/65 mmHg Patient Gender: M          HR:           93 bpm. Exam Location:  Inpatient Procedure: Transesophageal Echo, Cardiac Doppler and Color Doppler Indications:     Atrial fibrillation  History:         Patient has prior history of Echocardiogram examinations, most                  recent 02/06/2020. CHF, Arrythmias:Atrial Fibrillation; Risk                  Factors:Hypertension.  Sonographer:     Ross Ludwig RDCS (AE) Referring Phys:  [id number] Wendall Stade Diagnosing Phys: Chilton Si MD PROCEDURE: After discussion of the risks and benefits of a TEE, an informed consent was obtained from the patient. The transesophogeal probe was passed without difficulty through the  esophogus of the patient. Local oropharyngeal anesthetic was provided with Cetacaine. Sedation performed by different physician. Image quality was adequate. The patient's vital signs; including heart rate, blood pressure, and oxygen saturation; remained stable throughout the procedure. The patient developed no complications during the procedure. A successful direct current cardioversion was performed at 150 joules with 1 attempt. IMPRESSIONS  1. Left ventricular ejection fraction, by estimation, is 20 to 25%. The left ventricle has severely decreased function. The left ventricle demonstrates global hypokinesis.  2. Right ventricular systolic function is normal. The right ventricular size is normal. There is normal pulmonary artery systolic pressure.  3. Prominent left atrial appendage trabeculations without evidence of thrombus. No left atrial/left atrial appendage thrombus was detected. The LAA emptying velocity was 28 cm/s.  4. The mitral valve is normal in structure. Moderate mitral valve regurgitation. No evidence of mitral stenosis.  5. The aortic valve is tricuspid. Aortic valve regurgitation is mild. No aortic stenosis is present.  6. The inferior vena cava is normal in size with greater than 50% respiratory variability, suggesting right atrial pressure of 3 mmHg. FINDINGS  Left Ventricle: Left ventricular ejection fraction, by estimation, is 20 to 25%. The left ventricle has severely decreased function. The left ventricle demonstrates global hypokinesis. The left ventricular internal cavity size was normal in size. There is no left ventricular hypertrophy. Right Ventricle: The right ventricular size is normal. No increase in right ventricular wall thickness. Right ventricular systolic function is normal. There is normal pulmonary artery systolic pressure. The tricuspid regurgitant velocity is 2.14 m/s, and  with an assumed right atrial pressure of 10 mmHg, the estimated right ventricular systolic pressure is  28.3 mmHg. Left Atrium: Prominent left atrial  appendage trabeculations without evidence of thrombus. Left atrial size was normal in size. No left atrial/left atrial appendage thrombus was detected. The LAA emptying velocity was 28 cm/s. Right Atrium: Right atrial size was normal in size. Pericardium: There is no evidence of pericardial effusion. Mitral Valve: Multiple mitral valve regurgitant jets. The mitral valve is normal in structure. Normal mobility of the mitral valve leaflets. Moderate mitral valve regurgitation. No evidence of mitral valve stenosis. Tricuspid Valve: The tricuspid valve is normal in structure. Tricuspid valve regurgitation is mild . No evidence of tricuspid stenosis. Aortic Valve: The aortic valve is tricuspid. Aortic valve regurgitation is mild. No aortic stenosis is present. Pulmonic Valve: The pulmonic valve was normal in structure. Pulmonic valve regurgitation is mild. No evidence of pulmonic stenosis. Aorta: The aortic root is normal in size and structure. Venous: The inferior vena cava is normal in size with greater than 50% respiratory variability, suggesting right atrial pressure of 3 mmHg. IAS/Shunts: No atrial level shunt detected by color flow Doppler.  MR Peak grad:    42.2 mmHg   TRICUSPID VALVE MR Mean grad:    21.0 mmHg   TR Peak grad:   18.3 mmHg MR Vmax:         325.00 cm/s TR Vmax:        214.00 cm/s MR Vmean:        200.0 cm/s MR PISA:         4.02 cm MR PISA Eff ROA: 43 mm MR PISA Radius:  0.80 cm Chilton Si MD Electronically signed by Chilton Si MD Signature Date/Time: 02/08/2020/3:20:37 PM    Final     Cardiac Studies   Echo 02/06/20  1. Left ventricular ejection fraction, by estimation, is 20 to 25%. The  left ventricle has severely decreased function. The left ventricle  demonstrates global hypokinesis with significant septal dyssynchrony. The  left ventricular internal cavity size  was severely dilated. There is mild concentric left ventricular   hypertrophy.   2. Right ventricular systolic function is moderately reduced. The right  ventricular size is moderately enlarged. There is normal pulmonary artery  systolic pressure. The estimated right ventricular systolic pressure is  26.2 mmHg.   3. Left atrial size was severely dilated.   4. The mitral valve is normal in structure. Mild mitral valve  regurgitation. No evidence of mitral stenosis.   5. The aortic valve is normal in structure. Aortic valve regurgitation is  mild. No aortic stenosis is present.   6. The inferior vena cava is normal in size with greater than 50%  respiratory variability, suggesting right atrial pressure of 3 mmHg.   7. Right atrial size was mildly dilated.   Patient Profile     74 y.o. male with a hx of chronic systolic CHF with last LVEF at 25 to 30% per echo in 2016>>followed by Dr. Johney Frame and refused Biv ICD, chronic LBBB, hypertensive heart disease with CHF, paroxysmal/persistent atrial fibrillation previously controlled by amiodarone and no anticoagulation secondary to GI bleed who is being seen today for the evaluation of AF with RVR and CHF.   Assessment & Plan   Paroxysmal Afib with RVR - tolerating coreg 3.125 bid for rate. Amiodarone 400 bid for a week then 200 bid consider repeat DCC 4 weeks continue eliquis   Acute on chronic systolic HF - EF 32-67% in 2016 - repeat echo showed EF 20-25% global hypokinesis with significant septal dyssynchrony - Oral lasix 40 mg BID - seems more euvolemic K/Cr  stable on current dose - may be a candidate for CRT with wide LBBB consider EP consult if Repeat DCC fails   Mild MR/mild AR - per echo   For questions or updates, please contact [facility] Please consult [email or web address] for contact info under     May be ready for d/c Monday Needs PT/OT and outpatient referral to cardiac rehab Outpatient f/u with Dr Delton See    Signed, Charlton Haws, MD  02/10/2020, 8:09 AM

## 2020-02-10 NOTE — Plan of Care (Signed)

## 2020-02-10 NOTE — Progress Notes (Signed)
Patient anticipates discharge to home on 5/17--heart rate controlled better since PO amiodarone administration, stats in upper 90's on room air even with ambulation--tolerates ambulation in room and to the bathroom with no complications, has walked in hallway with OT today, tolerated well, appetite good, bowel movement today, reports no pain

## 2020-02-10 NOTE — Progress Notes (Addendum)
Occupational Therapy Treatment Patient Details Name: Bryan Moran MRN: [id number] DOB: Nov 03, 1945 Today's Date: 02/10/2020    History of present illness Pt adm with SOB and weakness and found to have a-fib with rvr. PMH - chf, htn, ckd   OT comments  Pt making steady progress toward OT goals. Session focused on BADL mobility progression, activity tolerance building, and ECS skill implementation. Pt completing functional mobility beyond household distance with RW at min guard level of assist. Pt reports feeling shaky this date, was mildly unsteady and needing close guarding assist. Education and cues given on ECS skills throughout session. Pt able to self initiate rest breaks. Educated pt on use of BSC in shower to conserve energy and maintain safety/balance while showering when home. HR 98-107 bpm with activity. D/c recs remain appropriate, will continue to follow.    Follow Up Recommendations  No OT follow up;Supervision - Intermittent    Equipment Recommendations  3 in 1 bedside commode    Recommendations for Other Services      Precautions / Restrictions Precautions Precautions: Fall Precaution Comments: monitor HR Restrictions Weight Bearing Restrictions: No       Mobility Bed Mobility Overal bed mobility: Needs Assistance Bed Mobility: Sit to Supine       Sit to supine: Min guard   General bed mobility comments: pt able to initially stand and face bed, organize lines then complete bed mobility at min guard level for safety  Transfers Overall transfer level: Needs assistance Equipment used: Rolling walker (2 wheeled) Transfers: Sit to/from Stand Sit to Stand: Min guard         General transfer comment: close steadying assist    Balance Overall balance assessment: Needs assistance Sitting-balance support: No upper extremity supported;Feet supported Sitting balance-Leahy Scale: Good     Standing balance support: Bilateral upper extremity supported;During  functional activity Standing balance-Leahy Scale: Poor Standing balance comment: more steady with external support                           ADL either performed or assessed with clinical judgement   ADL Overall ADL's : Needs assistance/impaired                     Lower Body Dressing: Set up;Sitting/lateral leans;Sit to/from stand Lower Body Dressing Details (indicate cue type and reason): increased time and effort to sustain figure four method Toilet Transfer: Min guard;Ambulation;Regular Toilet;Grab bars;RW [employer] forces technical [employer] Details (indicate cue type and reason): mild unsteadiness with functional mobility requiring min guard for safety         Functional mobility during ADLs: Min guard;Rolling walker       Vision Patient Visual Report: No change from baseline     Perception     Praxis      Cognition Arousal/Alertness: Awake/alert Behavior During Therapy: WFL for tasks assessed/performed Overall Cognitive Status: Within Functional Limits for tasks assessed                                          Exercises     Shoulder Instructions       General Comments      Pertinent Vitals/ Pain       Pain Assessment: No/denies pain  Home Living  Prior Functioning/Environment              Frequency  Min 2X/week        Progress Toward Goals  OT Goals(current goals can now be found in the care plan section)  Progress towards OT goals: Progressing toward goals  Acute Rehab OT Goals Patient Stated Goal: return to independence OT Goal Formulation: With patient Time For Goal Achievement: 02/21/20 Potential to Achieve Goals: Good  Plan Discharge plan remains appropriate    Co-evaluation                 AM-PAC OT "6 Clicks" Daily Activity     Outcome Measure   Help from another person eating meals?: None Help from another person taking care of personal  grooming?: None Help from another person toileting, which includes using toliet, bedpan, or urinal?: A Little Help from another person bathing (including washing, rinsing, drying)?: A Little Help from another person to put on and taking off regular upper body clothing?: None Help from another person to put on and taking off regular lower body clothing?: A Little 6 Click Score: 21    End of Session Equipment Utilized During Treatment: Gait belt;Rolling walker  OT Visit Diagnosis: Unsteadiness on feet (R26.81);Other abnormalities of gait and mobility (R26.89);Muscle weakness (generalized) (M62.81)   Activity Tolerance Patient tolerated treatment well   Patient Left in bed;with call bell/phone within reach   Nurse Communication Mobility status        Time: 3235-5732 OT Time Calculation (min): 14 min  Charges: OT General Charges $OT Visit: 1 Visit OT Treatments $Self Care/Home Management : 8-22 mins  Dalphine Handing, MSOT, OTR/L Acute Rehabilitation Services [facility] Office Number: [phone] Pager: [phone]  Dalphine Handing 02/10/2020, 9:53 AM

## 2020-02-11 LAB — BASIC METABOLIC PANEL
Anion gap: 12 (ref 5–15)
BUN: 33 mg/dL — ABNORMAL HIGH (ref 8–23)
CO2: 25 mmol/L (ref 22–32)
Calcium: 8 mg/dL — ABNORMAL LOW (ref 8.9–10.3)
Chloride: 104 mmol/L (ref 98–111)
Creatinine, Ser: 1.6 mg/dL — ABNORMAL HIGH (ref 0.61–1.24)
GFR calc Af Amer: 49 mL/min — ABNORMAL LOW (ref >60)
GFR calc non Af Amer: 42 mL/min — ABNORMAL LOW (ref >60)
Glucose, Bld: 106 mg/dL — ABNORMAL HIGH (ref 70–99)
Potassium: 3.8 mmol/L (ref 3.5–5.1)
Sodium: 141 mmol/L (ref 135–145)

## 2020-02-11 LAB — MAGNESIUM: Magnesium: 1.9 mg/dL (ref 1.7–2.4)

## 2020-02-11 LAB — CBC
HCT: 46.8 % (ref 39.0–52.0)
Hemoglobin: 15.1 g/dL (ref 13.0–17.0)
MCH: 31.7 pg (ref 26.0–34.0)
MCHC: 32.3 g/dL (ref 30.0–36.0)
MCV: 98.1 fL (ref 80.0–100.0)
Platelets: 131 10*3/uL — ABNORMAL LOW (ref 150–400)
RBC: 4.77 MIL/uL (ref 4.22–5.81)
RDW: 15.2 % (ref 11.5–15.5)
WBC: 8.6 10*3/uL (ref 4.0–10.5)
nRBC: 0 % (ref 0.0–0.2)

## 2020-02-11 LAB — CULTURE, BLOOD (ROUTINE X 2)
Culture: NO GROWTH
Special Requests: ADEQUATE

## 2020-02-11 MED ORDER — PANTOPRAZOLE SODIUM 40 MG PO TBEC
40.0000 mg | DELAYED_RELEASE_TABLET | Freq: Every day | ORAL | Status: DC
  Administered 2020-02-11 – 2020-02-13 (×3): 40 mg via ORAL
  Filled 2020-02-11 (×3): qty 1

## 2020-02-11 NOTE — Progress Notes (Addendum)
Progress Note  Patient Name: Bryan Moran Date of Encounter: 02/11/2020  Primary Cardiologist: Ena Dawley, MD   Subjective   No complaints, some wheezing.  Inpatient Medications    Scheduled Meds: . amiodarone  400 mg Oral BID  . apixaban  5 mg Oral BID  . carvedilol  3.125 mg Oral BID WC  . clotrimazole-betamethasone  1 application Topical Daily  . furosemide  40 mg Oral BID  . potassium chloride  20 mEq Oral Daily   Continuous Infusions:  PRN Meds: ipratropium-albuterol, ondansetron **OR** ondansetron (ZOFRAN) IV, polyethylene glycol, senna-docusate, white petrolatum   Vital Signs    Vitals:   02/11/20 0516 02/11/20 0625 02/11/20 0647 02/11/20 1109  BP: 98/81 107/76 (!) 111/93 106/83  Pulse: 94 (!) 121 (!) 102   Resp: (!) 24 (!) 21  (!) 28  Temp: 97.8 F (36.6 C)   (!) 97.5 F (36.4 C)  TempSrc: Oral   Oral  SpO2: 97% 90%    Weight: 112.3 kg     Height:        Intake/Output Summary (Last 24 hours) at 02/11/2020 1330 Last data filed at 02/11/2020 0526 Gross per 24 hour  Intake 480 ml  Output --  Net 480 ml   Last 3 Weights 02/11/2020 02/09/2020 02/06/2020  Weight (lbs) 247 lb 9.2 oz 252 lb 3.3 oz 245 lb 9.5 oz  Weight (kg) 112.3 kg 114.4 kg 111.4 kg      Telemetry    Afib with rates in the 90-110s - Personally Reviewed  ECG    Atrial fibrillation with ventricular rate 105, LBBB - Personally Reviewed  Physical Exam   GEN: No acute distress.   Neck: No JVD Cardiac: irregular rhythm, tachycardic rate  Respiratory: back on [place], wheezing in right base. GI: Soft, nontender, non-distended  MS: 3+ B LE edema Neuro:  Nonfocal  Psych: Normal affect   Labs    High Sensitivity Troponin:   Recent Labs  Lab 02/05/20 1800 02/05/20 2013 02/05/20 2304 02/06/20 0510  TROPONINIHS 75* 81* 83* 75*      Chemistry Recent Labs  Lab 02/07/20 0217 02/07/20 0217 02/08/20 0243 02/08/20 0243 02/09/20 0205 02/10/20 0153 02/11/20 0206  NA 140   < > 139    < > 140 140 141  K 4.2   < > 3.5   < > 3.3* 4.0 3.8  CL 112*   < > 109   < > 107 108 104  CO2 18*   < > 22   < > 24 23 25   GLUCOSE 122*   < > 112*   < > 119* 103* 106*  BUN 36*   < > 37*   < > 39* 33* 33*  CREATININE 1.90*   < > 1.81*   < > 1.67* 1.47* 1.60*  CALCIUM 8.1*   < > 8.1*   < > 7.7* 7.8* 8.0*  PROT 4.9*  --  5.2*  --  5.0*  --   --   ALBUMIN 2.6*  --  2.7*  --  2.5*  --   --   AST 19  --  14*  --  16  --   --   ALT 29  --  30  --  27  --   --   ALKPHOS 50  --  52  --  49  --   --   BILITOT 1.5*  --  1.2  --  1.5*  --   --  GFRNONAA 34*   < > 37*   < > 40* 47* 42*  GFRAA 40*   < > 42*   < > 47* 54* 49*  ANIONGAP 10   < > 8   < > 9 9 12    < > = values in this interval not displayed.     Hematology Recent Labs  Lab 02/09/20 0205 02/10/20 0153 02/11/20 0206  WBC 9.2 6.9 8.6  RBC 4.78 4.73 4.77  HGB 15.0 15.0 15.1  HCT 46.5 46.4 46.8  MCV 97.3 98.1 98.1  MCH 31.4 31.7 31.7  MCHC 32.3 32.3 32.3  RDW 15.1 15.2 15.2  PLT 129* 129* 131*    BNP Recent Labs  Lab 02/05/20 1802  BNP 789.9*     DDimer  Recent Labs  Lab 02/06/20 0640  DDIMER 1.92*     Radiology    No results found.  Cardiac Studies   02/08/20: Brief TEE Note  LVEF 20-25% Mild AR, PR and TR.  Moderate MR. No LA/LAA thrombus   DCCV: Cardioverted 1 time(s).  Cardioverted at 150J.  Evaluation Findings: Post procedure EKG shows: Sinus rhythm with frequent PVCs and PACs.  Junctional at times. Complications: None Patient did tolerate procedure well.   Echo 02/06/20 1. Left ventricular ejection fraction, by estimation, is 20 to 25%. The  left ventricle has severely decreased function. The left ventricle  demonstrates global hypokinesis with significant septal dyssynchrony. The  left ventricular internal cavity size  was severely dilated. There is mild concentric left ventricular  hypertrophy.  2. Right ventricular systolic function is moderately reduced. The right  ventricular  size is moderately enlarged. There is normal pulmonary artery  systolic pressure. The estimated right ventricular systolic pressure is  26.2 mmHg.  3. Left atrial size was severely dilated.  4. The mitral valve is normal in structure. Mild mitral valve  regurgitation. No evidence of mitral stenosis.  5. The aortic valve is normal in structure. Aortic valve regurgitation is  mild. No aortic stenosis is present.  6. The inferior vena cava is normal in size with greater than 50%  respiratory variability, suggesting right atrial pressure of 3 mmHg.  7. Right atrial size was mildly dilated.   Patient Profile     74 y.o. male with a hx of chronic systolic CHFwithlast LVEF at25 to 30%perecho in 2016>>followed by Dr. 62 and refused Biv ICD,chronicLBBB,hypertensive heart disease with CHF, paroxysmal/persistentatrial fibrillation previouslycontrolled by amiodarone and no anticoagulation secondary to GI bleedwho is being seen today for the evaluation ofAF with RVR and CHF.   Assessment & Plan    Paroxysmal atrial fibrillation with RVR - pt underwent TEE-DCCV 02/08/20 with conversion to sinus rhythm - unfortunately, patient reverted back to Afib in less than 24 hrs - IV --> PO amiodarone, now on 400 mg BID  - continue coreg 3.125 mg BID - rates in the 80-110s - anticoagulated with eliquis 5 mg BID - This patients CHA2DS2-VASc Score and unadjusted Ischemic Stroke Rate (% per year) is equal to 3.2 % stroke rate/year from a score of 3 (age, CHF, HTN) - pt has a history of GI bleed thought to be secondary to NSAID use --> will start 40 mg protonix - pt volume up  - pt was on amiodarone for many years which maintained sinus rhythm before self-discontinuing, which is reassuring   Hypotension Hx of hypertension - pressure in the 80s systolic - home medications on hold include 25 mg coreg BID, 40 mg lisinopril, 25  mg spironolactone   Acute on chronic systolic heart failure -  Echo this admission EF 20-25%, from 25-30% in 2016 - global hypokinesis with significant septal dyssynchrony  - diuresing on 40 mg lasix BID - spiro and lisinopril on hold - sCr 1.60 - trending down - repeat BMP in 1 week - if he fails repeat cardioversion, may need to consider CRT in the setting of LBBB - pt is significantly volume overloaded today - diuresis limited by pressure     For questions or updates, please contact [facility] Please consult [email or web address] for contact info under        Signed, Marcelino Duster, PA  02/11/2020, 1:30 PM    Personally seen and examined. Agree with above.   74 year old with persistent atrial fibrillation, recent failed cardioversion, currently on amiodarone.  Resting fairly comfortably in bed.  Still with lower extremity edema although improved.  GEN: Well nourished, well developed, in no acute distress  HEENT: normal  Neck: no JVD, carotid bruits, or masses Cardiac: Irregularly irregular mildly tachycardic; no murmurs, rubs, or gallops, 2+ lower extremity bilateral edema  Respiratory:  clear to auscultation bilaterally, normal work of breathing GI: soft, nontender, nondistended, + BS MS: no deformity or atrophy  Skin: warm and dry, no rash Neuro:  Alert and Oriented x 3, Strength and sensation are intact Psych: euthymic mood, full affect  Telemetry personally reviewed shows atrial fibrillation heart rates ranging between 101 110  Assessment and plan:  Persistent atrial fibrillation -Failed cardioversion.  We will go ahead and continue with p.o. amiodarone 400 mg twice a day then on discharge, 200 mg twice a day with plan to repeat cardioversion attempt in 4 weeks. -If unsuccessful, consider CRT with left bundle branch block, EP consultation. -Continue with Eliquis.  Acute on chronic systolic heart failure -Unable to tolerate high doses of IV Lasix secondary to hypotension.  Continue with p.o. -Carvedilol has been decreased to  3.125 twice a day.  No longer able to support spironolactone or ACE inhibitor.  Seems reasonable to consider discharge.  Donato Schultz, MD

## 2020-02-11 NOTE — Progress Notes (Signed)
PROGRESS NOTE    Bryan Moran  [name]:[id number] DOB: 1946-06-17 DOA: 02/05/2020 PCP: Lorene Dy, MD    Brief Narrative:  Bryan Moran is a 74 year old Caucasian male with past medical history remarkable for chronic combined systolic/diastolic congestive heart failure last EF 25-30% with previous refusal of ICD, chronic left bundle branch block, essential hypertension, paroxysmal/persistent atrial fibrillation CKD stage III who presented with progressive shortness of breath, weakness.  In the ER patient was in A. fib with RVR blood pressure was fluctuating between normal to very high and was started on Cardizem infusion.  Due to mildly elevated lactate and low normal blood pressure final cc normal saline bolus was given for which blood pressure has remained stable.  Patient's creatinine has increased from 1.1 about 3 years ago to 1.70.  LFTs are normal abdomen appears benign.  Chest x-ray showing features concerning for left hilar mass with upper lobe density for which CT chest was recommended and CT chest showed bilateral hilar airspace disease for which follow-up was recommended.  Patient is afebrile denies any productive cough fever or chills.  No definite signs of any pneumonia.  BNP was elevated at 789 high sensitive troponin was 75-81 lactic acid was 2.4 going improved to 2.1 CBC was unremarkable.  Mild lower extremity edema.  Stool studies have been ordered.  Patient admitted for A. fib with RVR with shortness of breath which likely could be from CHF.    Assessment & Plan:   Active Problems:   Chronic systolic dysfunction of left ventricle   HTN (hypertension)   Atrial fibrillation with RVR (HCC)   Diarrhea   AKI (acute kidney injury) ([place])   Acute respiratory failure with hypoxia (HCC)   Acute hypoxic respiratory failure Acute on chronic decompensated combined systolic/diastolic CHF exacerbation Patient presenting with progressive shortness of breath, history of medical noncompliance.   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  BNP elevated 789.  TTE with EF 20-25%, severely decreased LV function with global hypokinesis and septal dyssynchrony, severely dilated LV, RV function mildly reduced, LA severely dilated with normal IVC and grade 2 diastolic dysfunction. --Cardiology following, appreciate assistance --Coreg 3.125 mg p.o. twice daily --Continue furosemide 40 mg p.o.BID --Strict I's and O's and daily weights  Paroxysmal/permanent atrial fibrillation with rapid ventricular response Etiology likely secondary to noncompliance with medication regimen.  Previous was on amiodarone and he has stopped for some time now with unclear reason.  Underwent DCCV on 02/08/2020 with initial conversion to NSR but now back in atrial fibrillation. --Amiodarone drip gtt converted to 400mg  BID x 7d, then 200mg  BID --Coreg 3.125mg  PO BID   --Continue Eliquis for anticoagulation --Cardiology can reconsider repeat DCCV in 4 weeks if remains in A. Fib; consider EP evaluation for CRT with wide LBBB if repeat DCCV failed --Continue monitor on telemetry  Hypokalemia Potassium 4.0 --Monitor electrolytes daily  Acute on chronic kidney disease stage IIIa Baseline creatinine 1.3 --Cr 1.73>1.36>1.90>1.81>1.67>1.47>1.60 --Patient's home lisinopril discontinued --Continue furosemide 40 mg p.o. twice daily --Continue to monitor renal function closely with diuresis  Generalized weakness, Deconditioning Patient currently lives at home alone, no significant family support close as sister lives 14 miles away in [place].  Evaluated by PT recommends home health.  OT with no recommendations.  Patchy airspace disease, right upper lobe/anterior left hilum Incidental finding on CT chest.  Will need follow-up CT chest in 2-3 months to ensure resolution as underlying neoplastic process  cannot be excluded at this time.   DVT  prophylaxis: Eliquis Code Status: Full code Family Communication: No family present at bedside  Disposition Plan:  Status is: Inpatient  Remains inpatient appropriate because:Hemodynamically unstable, Ongoing diagnostic testing needed not appropriate for outpatient work up, Unsafe d/c plan and IV treatments appropriate due to intensity of illness or inability to take PO    Dispo: The patient is from: Home              Anticipated d/c is to: Home              Anticipated d/c date is: 1 day              Patient currently is not medically stable to d/c.   Consultants:   Cardiology  Procedures:   TEE/DCCV: 02/08/2020 - Dr. Duke Salvia  Antimicrobials:   None   Subjective: Patient seen and examined bedside, resting comfortably in bedside chair.  Rate is now better controlled seems to fluctuate in the upper 90s 110's.  Continues in atrial fibrillation despite attempts at DCCV 3 days ago.  Patient without any complaints or concerns at this time.  Denies headache, no visual changes, no chest pain, no palpitations, no abdominal pain, no cough/congestion, no nausea/vomiting/diarrhea, no fever/chills/night sweats.  Nursing with concerns of hypotension overnight, otherwise no other acute events overnight per nursing staff.  Objective: Vitals:   02/11/20 0400 02/11/20 0516 02/11/20 0625 02/11/20 0647  BP:  98/81 107/76 (!) 111/93  Pulse: 93 94 (!) 121 (!) 102  Resp: 19 (!) 24 (!) 21   Temp:  97.8 F (36.6 C)    TempSrc:  Oral    SpO2: 96% 97% 90%   Weight:  112.3 kg    Height:        Intake/Output Summary (Last 24 hours) at 02/11/2020 0921 Last data filed at 02/11/2020 0526 Gross per 24 hour  Intake 960 ml  Output --  Net 960 ml   Filed Weights   02/06/20 1630 02/09/20 0400 02/11/20 0516  Weight: 111.4 kg 114.4 kg 112.3 kg    Examination:  General exam: Appears calm and comfortable  Respiratory system: Clear to auscultation. Respiratory effort normal.  Oxygenating well on  room air Cardiovascular system: irregularly irregular rhythm, normal rate. No JVD, murmurs, rubs, gallops or clicks.  1+ pitting edema bilateral lower extremities, right greater than left Gastrointestinal system: Abdomen is nondistended, soft and nontender. No organomegaly or masses felt. Normal bowel sounds heard. Central nervous system: Alert and oriented. No focal neurological deficits. Extremities: Symmetric 5 x 5 power. Skin: No rashes, lesions or ulcers Psychiatry: Judgement and insight appear poor. Mood & affect appropriate.     Data Reviewed: I have personally reviewed following labs and imaging studies  CBC: Recent Labs  Lab 02/07/20 0217 02/08/20 0243 02/09/20 0205 02/10/20 0153 02/11/20 0206  WBC 8.4 7.8 9.2 6.9 8.6  HGB 15.3 15.3 15.0 15.0 15.1  HCT 47.9 47.7 46.5 46.4 46.8  MCV 99.6 97.9 97.3 98.1 98.1  PLT 138* 137* 129* 129* 131*   Basic Metabolic Panel: Recent Labs  Lab 02/07/20 0217 02/08/20 0243 02/09/20 0205 02/10/20 0153 02/11/20 0206  NA 140 139 140 140 141  K 4.2 3.5 3.3* 4.0 3.8  CL 112* 109 107 108 104  CO2 18* 22 24 23 25   GLUCOSE 122* 112* 119* 103* 106*  BUN 36* 37* 39* 33* 33*  CREATININE 1.90* 1.81* 1.67* 1.47* 1.60*  CALCIUM 8.1* 8.1* 7.7* 7.8* 8.0*  MG 2.3 2.1 2.0 1.9 1.9   GFR: Estimated Creatinine Clearance: 56.4 mL/min (A) (by C-G formula based on SCr of 1.6 mg/dL (H)). Liver Function Tests: Recent Labs  Lab 02/05/20 1800 02/06/20 0510 02/07/20 0217 02/08/20 0243 02/09/20 0205  AST 28 21 19  14* 16  ALT 41 35 29 30 27   ALKPHOS 52 53 50 52 49  BILITOT 2.0* 2.7* 1.5* 1.2 1.5*  PROT 5.1* 5.0* 4.9* 5.2* 5.0*  ALBUMIN 2.8* 2.7* 2.6* 2.7* 2.5*   Recent Labs  Lab 02/05/20 1800  LIPASE 28   No results for input(s): AMMONIA in the last 168 hours. Coagulation Profile: No results for input(s): INR, PROTIME in the last 168 hours. Cardiac Enzymes: No results for input(s): CKTOTAL, CKMB, CKMBINDEX, TROPONINI in the last 168  hours. BNP (last 3 results) No results for input(s): PROBNP in the last 8760 hours. HbA1C: No results for input(s): HGBA1C in the last 72 hours. CBG: No results for input(s): GLUCAP in the last 168 hours. Lipid Profile: No results for input(s): CHOL, HDL, LDLCALC, TRIG, CHOLHDL, LDLDIRECT in the last 72 hours. Thyroid Function Tests: No results for input(s): TSH, T4TOTAL, FREET4, T3FREE, THYROIDAB in the last 72 hours. Anemia Panel: No results for input(s): VITAMINB12, FOLATE, FERRITIN, TIBC, IRON, RETICCTPCT in the last 72 hours. Sepsis Labs: Recent Labs  Lab 02/05/20 1802 02/05/20 2013 02/05/20 2304 02/06/20 0510  PROCALCITON  --   --  <0.10  --   LATICACIDVEN 2.4* 2.1*  --  1.3    Recent Results (from the past 240 hour(s))  SARS Coronavirus 2 by RT PCR (hospital order, performed in [facility] hospital lab) Nasopharyngeal Nasopharyngeal Swab     Status: None   Collection Time: 02/05/20  6:36 PM   Specimen: Nasopharyngeal Swab  Result Value Ref Range Status   SARS Coronavirus 2 NEGATIVE NEGATIVE Final    Comment: (NOTE) SARS-CoV-2 target nucleic acids are NOT DETECTED. The SARS-CoV-2 RNA is generally detectable in upper and lower respiratory specimens during the acute phase of infection. The lowest concentration of SARS-CoV-2 viral copies this assay can detect is 250 copies / mL. A negative result does not preclude SARS-CoV-2 infection and should not be used as the sole basis for treatment or other patient management decisions.  A negative result may occur with improper specimen collection / handling, submission of specimen other than nasopharyngeal swab, presence of viral mutation(s) within the areas targeted by this assay, and inadequate number of viral copies (<250 copies / mL). A negative result must be combined with clinical observations, patient history, and epidemiological information. Fact Sheet for Patients:   [facility] Fact  Sheet for Healthcare Providers: 04/06/20 This test is not yet approved or cleared  by the [employer] FDA and has been authorized for detection and/or diagnosis of SARS-CoV-2 by FDA under an Emergency Use Authorization (EUA).  This EUA will remain in effect (meaning this test can be used) for the duration of the COVID-19 declaration under Section 564(b)(1) of the Act, 21 U.S.C. section 360bbb-3(b)(1), unless the authorization is terminated or revoked sooner. Performed at [facility], [address] [place], [address] [place]   Culture, blood (routine x 2)     Status: None   Collection Time: 02/05/20 11:05 PM   Specimen: BLOOD LEFT HAND  Result Value Ref Range Status   Specimen Description BLOOD LEFT HAND  Final   Special Requests   Final    BOTTLES DRAWN AEROBIC AND ANAEROBIC Blood Culture adequate volume  Culture   Final    NO GROWTH 5 DAYS Performed at [facility], [address] [place], [place] [address]    Report Status 02/10/2020 FINAL  Final  Urine culture     Status: None   Collection Time: 02/06/20  1:48 AM   Specimen: Urine, Random  Result Value Ref Range Status   Specimen Description URINE, RANDOM  Final   Special Requests NONE  Final   Culture   Final    NO GROWTH Performed at [facility], [address], [place], [place] [address]    Report Status 02/06/2020 FINAL  Final  Culture, blood (routine x 2)     Status: None (Preliminary result)   Collection Time: 02/06/20  5:10 AM   Specimen: BLOOD  Result Value Ref Range Status   Specimen Description BLOOD LEFT HAND  Final   Special Requests   Final    BOTTLES DRAWN AEROBIC AND ANAEROBIC Blood Culture adequate volume   Culture   Final    NO GROWTH 4 DAYS Performed at [facility], [address] [place], [place] [address]    Report Status PENDING  Incomplete  MRSA PCR Screening     Status: None   Collection Time: 02/06/20  3:47 PM   Specimen:  Nasopharyngeal  Result Value Ref Range Status   MRSA by PCR NEGATIVE NEGATIVE Final    Comment:        The GeneXpert MRSA Assay (FDA approved for NASAL specimens only), is one component of a comprehensive MRSA colonization surveillance program. It is not intended to diagnose MRSA infection nor to guide or monitor treatment for MRSA infections. Performed at [facility], [address], [place], [place] [address]   C Difficile Quick Screen w PCR reflex     Status: None   Collection Time: 02/06/20  5:53 PM   Specimen: STOOL  Result Value Ref Range Status   C Diff antigen NEGATIVE NEGATIVE Final   C Diff toxin NEGATIVE NEGATIVE Final   C Diff interpretation No C. difficile detected.  Final    Comment: Performed at [facility], [name] [place], [place] [address]         Radiology Studies: No results found.      Scheduled Meds: . amiodarone  400 mg Oral BID  . apixaban  5 mg Oral BID  . carvedilol  3.125 mg Oral BID WC  . clotrimazole-betamethasone  1 application Topical Daily  . furosemide  40 mg Oral BID  . potassium chloride  20 mEq Oral Daily   Continuous Infusions:    LOS: 5 days    Time spent: 34 minutes spent on chart review, discussion with nursing staff, consultants, updating family and interview/physical exam; more than 50% of that time was spent in counseling and/or coordination of care.    Alvira Philips [place], DO [facility] Available via Epic secure chat 7am-7pm After these hours, please refer to coverage provider listed on [email or web address] 02/11/2020, 9:21 AM

## 2020-02-11 NOTE — Progress Notes (Signed)
Heart Failure Stewardship Pharmacist Progress Note   PCP: Burton Apley, MD PCP-Cardiologist: Tobias Alexander, MD    HPI:  74 yo M with PMH significant for paroxysmal afib (not on anticoagulation secondary to GI bleed), chronic systolic heart failure (previously refused ICD), chronic LBBB, and hypertensive heart disease. Admitted on 02/05/20 with afib with RVR.  ECHO from March 2016 with LVEF 25-30%.   LVEF now further reduced to 20-25% on ECHO from 02/06/20.  S/p DCCV on 5/14 but back in afib. Amiodarone was increased. Plan repeat DCCV in a few weeks.  Current HF Medications: Furosemide 40 mg BID KCl 20 mEq daily Carvedilol 3.125 mg BID  Prior to admission HF Medications: Furosemide 40 mg daily PRN swelling Carvedilol 25 mg BID - not taking Lisinopril 40 mg daily Spironolactone 25 mg daily - not taking  Pertinent Lab Values: . Serum creatinine 1.60, BUN 33, Potassium 3.8, Sodium 141, BNP 789.9, Magnesium 1.9  Vital Signs: . Weight: 247 lbs (estimated dry weight: 245 lbs) . Blood pressure: 90-110/60-90s . Heart rate: 90-100s   Medication Assistance / Insurance Benefits Check: Does the patient have prescription insurance? Yes Type of insurance plan: commercial insurance - BCBS  Does the patient qualify for medication assistance through manufacturers or grants? Yes   Eligible grants and/or patient assistance programs: pending HF regimen  Medication assistance applications in progress: None  Medication assistance applications approved: None  Approved medication assistance renewals will be completed by: Dr. Lindaann Slough office  Outpatient Pharmacy:  Prior to admission outpatient pharmacy: [facility] Is the patient willing to use [facility] TOC pharmacy at discharge? Yes - discharge pharmacy has been updated Is the patient willing to transition their outpatient pharmacy to utilize a [facility] outpatient pharmacy? No    Assessment: 1. Chronic systolic CHF (EF  20-25%). NYHA class II/III symptoms. Mildly volume overloaded on MD exam. 1+ pitting edema bilateral lower extremities. R>L. - Continue furosemide 40 mg BID - Continue KCl 20 mEq daily - Continue carvedilol 3.125 mg BID - Holding lisinopril with AKI - Consider restarting spironolactone once BP and SCr improve   Plan: 1) Medication changes recommended at this time: - None - BP soft with current regimen  2) Patient assistance application(s): - None pending benefits check  3)  Education - Patient has been educated on current HF medications (BB, furosemide, KCl) as well as potential additions to HF regimen (ACE/ARB/ARNi, spironolactone, SGLT2i) -Patient verbalizes understanding that over the next few months, these medication doses may change and more medications may be added to optimize HF regimen -Patient has been educated on basic disease state pathophysiology and goals of therapy -Time spent (15 mins)   Danae Orleans, PharmD, BCPS Heart Failure Stewardship Pharmacist Phone [phone] 02/11/2020       9:55 AM

## 2020-02-11 NOTE — Plan of Care (Signed)
  Problem: Education: Goal: Knowledge of General Education information will improve Description: Including pain rating scale, medication(s)/side effects and non-pharmacologic comfort measures Outcome: Progressing   Problem: Health Behavior/Discharge Planning: Goal: Ability to manage health-related needs will improve Outcome: Progressing   Problem: Clinical Measurements: Goal: Ability to maintain clinical measurements within normal limits will improve Outcome: Progressing Goal: Will remain free from infection Outcome: Progressing Goal: Respiratory complications will improve Outcome: Progressing   Problem: Activity: Goal: Risk for activity intolerance will decrease Outcome: Progressing   Problem: Nutrition: Goal: Adequate nutrition will be maintained Outcome: Progressing   Problem: Coping: Goal: Level of anxiety will decrease Outcome: Progressing   

## 2020-02-11 NOTE — Progress Notes (Signed)
Physical Therapy Treatment Patient Details Name: Bryan Moran MRN: [id number] DOB: August 04, 1946 Today's Date: 02/11/2020    History of Present Illness Pt adm with SOB and weakness and found to have a-fib with rvr. PMH - chf, htn, ckd    PT Comments    Pt making good progress. Increased amb and HR between 90-109. Continue to recommend HHPT at DC.    Follow Up Recommendations  Home health PT;Supervision - Intermittent     Equipment Recommendations  None recommended by PT;Other (comment)(pt reported having a RW at home)    Recommendations for Other Services       Precautions / Restrictions Precautions Precautions: Fall Precaution Comments: monitor HR Restrictions Weight Bearing Restrictions: No    Mobility  Bed Mobility Overal bed mobility: Modified Independent Bed Mobility: Supine to Sit     Supine to sit: Modified independent (Device/Increase time);HOB elevated     General bed mobility comments: Incr time and effort but no assist  Transfers Overall transfer level: Needs assistance Equipment used: Rolling walker (2 wheeled) Transfers: Sit to/from Stand Sit to Stand: Supervision;From elevated surface         General transfer comment: Assist for lines/safety. Incr tiime and effort  Ambulation/Gait Ambulation/Gait assistance: Supervision Gait Distance (Feet): 300 Feet Assistive device: Rolling walker (2 wheeled) Gait Pattern/deviations: Step-through pattern;Decreased stride length Gait velocity: decr Gait velocity interpretation: 1.31 - 2.62 ft/sec, indicative of limited community ambulator General Gait Details: supervision for lines/safety. HR 92-109   Stairs             Wheelchair Mobility    Modified Rankin (Stroke Patients Only)       Balance Overall balance assessment: Needs assistance Sitting-balance support: No upper extremity supported;Feet supported Sitting balance-Leahy Scale: Good     Standing balance support: During functional  activity;No upper extremity supported Standing balance-Leahy Scale: Fair                              Cognition Arousal/Alertness: Awake/alert Behavior During Therapy: WFL for tasks assessed/performed Overall Cognitive Status: Within Functional Limits for tasks assessed                                        Exercises      General Comments        Pertinent Vitals/Pain Pain Assessment: No/denies pain    Home Living                      Prior Function            PT Goals (current goals can now be found in the care plan section) Progress towards PT goals: Progressing toward goals    Frequency    Min 3X/week      PT Plan Current plan remains appropriate    Co-evaluation              AM-PAC PT "6 Clicks" Mobility   Outcome Measure  Help needed turning from your back to your side while in a flat bed without using bedrails?: None Help needed moving from lying on your back to sitting on the side of a flat bed without using bedrails?: None Help needed moving to and from a bed to a chair (including a wheelchair)?: None Help needed standing up from a chair using your arms (e.g., wheelchair or bedside  chair)?: None Help needed to walk in hospital room?: A Little Help needed climbing 3-5 steps with a railing? : A Little 6 Click Score: 22    End of Session Equipment Utilized During Treatment: Gait belt Activity Tolerance: Patient tolerated treatment well Patient left: in chair;with call bell/phone within reach Nurse Communication: Mobility status PT Visit Diagnosis: Unsteadiness on feet (R26.81);Muscle weakness (generalized) (M62.81)     Time: 4665-9935 PT Time Calculation (min) (ACUTE ONLY): 15 min  Charges:  $Gait Training: 8-22 mins                     [place] Pager [phone] Office [place] 02/11/2020, 10:57 AM

## 2020-02-12 LAB — BASIC METABOLIC PANEL
Anion gap: 9 (ref 5–15)
BUN: 34 mg/dL — ABNORMAL HIGH (ref 8–23)
CO2: 24 mmol/L (ref 22–32)
Calcium: 8.2 mg/dL — ABNORMAL LOW (ref 8.9–10.3)
Chloride: 107 mmol/L (ref 98–111)
Creatinine, Ser: 1.48 mg/dL — ABNORMAL HIGH (ref 0.61–1.24)
GFR calc Af Amer: 54 mL/min — ABNORMAL LOW (ref >60)
GFR calc non Af Amer: 47 mL/min — ABNORMAL LOW (ref >60)
Glucose, Bld: 125 mg/dL — ABNORMAL HIGH (ref 70–99)
Potassium: 3.9 mmol/L (ref 3.5–5.1)
Sodium: 140 mmol/L (ref 135–145)

## 2020-02-12 MED ORDER — FUROSEMIDE 40 MG PO TABS: 40.0000 mg | ORAL_TABLET | Freq: Two times a day (BID) | ORAL | 0 refills | Status: AC

## 2020-02-12 MED ORDER — CARVEDILOL 3.125 MG PO TABS: 3.1250 mg | ORAL_TABLET | Freq: Two times a day (BID) | ORAL | 0 refills | Status: AC

## 2020-02-12 MED ORDER — POTASSIUM CHLORIDE CRYS ER 20 MEQ PO TBCR: 20.0000 meq | EXTENDED_RELEASE_TABLET | Freq: Every day | ORAL | 0 refills | Status: AC

## 2020-02-12 MED ORDER — PANTOPRAZOLE SODIUM 40 MG PO TBEC
40.0000 mg | DELAYED_RELEASE_TABLET | Freq: Every day | ORAL | 0 refills | Status: AC
Start: 2020-02-12 — End: 2020-05-12

## 2020-02-12 MED ORDER — APIXABAN 5 MG PO TABS: 5.0000 mg | ORAL_TABLET | Freq: Two times a day (BID) | ORAL | 0 refills | Status: AC

## 2020-02-12 MED ORDER — AMIODARONE HCL 200 MG PO TABS
ORAL_TABLET | ORAL | 0 refills | Status: AC
Start: 2020-02-12 — End: 2020-05-16

## 2020-02-12 NOTE — Progress Notes (Addendum)
Heart Failure Stewardship Pharmacist Progress Note   PCP: Burton Apley, MD PCP-Cardiologist: Tobias Alexander, MD    HPI:  74 yo M with PMH significant for paroxysmal afib (not on anticoagulation secondary to GI bleed), chronic systolic heart failure (previously refused ICD), chronic LBBB, and hypertensive heart disease. Admitted on 02/05/20 with afib with RVR.  ECHO from March 2016 with LVEF 25-30%.   LVEF now further reduced to 20-25% on ECHO from 02/06/20.  S/p DCCV on 5/14 but back in afib. Amiodarone was increased. Plan repeat DCCV in a few weeks.  Current HF Medications: Furosemide 40 mg BID KCl 20 mEq daily Carvedilol 3.125 mg BID  Prior to admission HF Medications: Furosemide 40 mg daily PRN swelling Carvedilol 25 mg BID - not taking Lisinopril 40 mg daily Spironolactone 25 mg daily - not taking  Pertinent Lab Values: . Serum creatinine 1.48, BUN 34, Potassium 3.9, Sodium 140, BNP 789.9, Magnesium 1.9  Vital Signs: . Weight: 246 lbs (estimated dry weight: 245 lbs) . Blood pressure: 110/80s . Heart rate: 100-110s   Medication Assistance / Insurance Benefits Check: Does the patient have prescription insurance? Yes Type of insurance plan: commercial insurance - BCBS  Does the patient qualify for medication assistance through manufacturers or grants? Yes   Eligible grants and/or patient assistance programs: pending HF regimen  Medication assistance applications in progress: None  Medication assistance applications approved: None  Approved medication assistance renewals will be completed by: Dr. Lindaann Slough office  Outpatient Pharmacy:  Prior to admission outpatient pharmacy: [facility] Is the patient willing to use [facility] TOC pharmacy at discharge? Yes - discharge pharmacy has been updated Is the patient willing to transition their outpatient pharmacy to utilize a [facility] outpatient pharmacy? No    Assessment: 1. Chronic systolic CHF (EF 23-53%).  NYHA class II/III symptoms. Mildly volume overloaded on MD exam. 1+ pitting edema bilateral lower extremities. - Continue furosemide 40 mg BID - Continue KCl 20 mEq daily - Continue carvedilol 3.125 mg BID - Holding lisinopril and spironolactone with low BP   Plan: 1) Medication changes recommended at this time: - None - BP soft with current regimen - Consider restarting low dose lisinopril and spironolactone at follow up appointment with Dr. Delton See   2) Patient assistance application(s): - None pending  3)  Education - Patient has been educated on current HF medications (BB, furosemide, KCl) as well as potential additions to HF regimen (ACE/ARB/ARNi, spironolactone, SGLT2i) -Patient verbalizes understanding that over the next few months, these medication doses may change and more medications may be added to optimize HF regimen -Patient has been educated on basic disease state pathophysiology and goals of therapy -Time spent (15 mins)   Danae Orleans, PharmD, BCPS Heart Failure Stewardship Pharmacist Phone [phone] 02/12/2020       9:11 AM

## 2020-02-12 NOTE — Progress Notes (Signed)
Progress Note  Patient Name: Bryan Moran Date of Encounter: 02/12/2020  Primary Cardiologist: Tobias Alexander, MD   Subjective   In bed, comfortable, no significant shortness of breath.  Inpatient Medications    Scheduled Meds: . amiodarone  400 mg Oral BID  . apixaban  5 mg Oral BID  . carvedilol  3.125 mg Oral BID WC  . clotrimazole-betamethasone  1 application Topical Daily  . furosemide  40 mg Oral BID  . pantoprazole  40 mg Oral Daily  . potassium chloride  20 mEq Oral Daily   Continuous Infusions:  PRN Meds: ipratropium-albuterol, ondansetron **OR** ondansetron (ZOFRAN) IV, polyethylene glycol, senna-docusate, white petrolatum   Vital Signs    Vitals:   02/11/20 2125 02/11/20 2338 02/12/20 0455 02/12/20 0700  BP: 100/78 119/84  114/82  Pulse: (!) 111 (!) 106  93  Resp: 18   20  Temp: (!) 97.4 F (36.3 C) (!) 97.5 F (36.4 C)  98.2 F (36.8 C)  TempSrc: Oral Oral  Oral  SpO2: 94%   95%  Weight:   111.9 kg   Height:        Intake/Output Summary (Last 24 hours) at 02/12/2020 0845 Last data filed at 02/12/2020 0830 Gross per 24 hour  Intake 480 ml  Output --  Net 480 ml   Last 3 Weights 02/12/2020 02/11/2020 02/09/2020  Weight (lbs) 246 lb 11.1 oz 247 lb 9.2 oz 252 lb 3.3 oz  Weight (kg) 111.9 kg 112.3 kg 114.4 kg      Telemetry    Atrial fibrillation heart rate 100-110 mostly interventricular conduction delay- Personally Reviewed  ECG    No new- Personally Reviewed  Physical Exam   GEN: No acute distress.   Neck: No JVD Cardiac:  Irregularly irregular, no murmurs, rubs, or gallops.  Respiratory:  Lungs sound less wheezy today. GI: Soft, nontender, non-distended  MS:  1+ lower extremity bilateral edema; No deformity. Neuro:  Nonfocal  Psych: Normal affect   Labs    High Sensitivity Troponin:   Recent Labs  Lab 02/05/20 1800 02/05/20 2013 02/05/20 2304 02/06/20 0510  TROPONINIHS 75* 81* 83* 75*      Chemistry Recent Labs  Lab  02/07/20 0217 02/07/20 0217 02/08/20 0243 02/08/20 0243 02/09/20 0205 02/09/20 0205 02/10/20 0153 02/11/20 0206 02/12/20 0216  NA 140   < > 139   < > 140   < > 140 141 140  K 4.2   < > 3.5   < > 3.3*   < > 4.0 3.8 3.9  CL 112*   < > 109   < > 107   < > 108 104 107  CO2 18*   < > 22   < > 24   < > 23 25 24   GLUCOSE 122*   < > 112*   < > 119*   < > 103* 106* 125*  BUN 36*   < > 37*   < > 39*   < > 33* 33* 34*  CREATININE 1.90*   < > 1.81*   < > 1.67*   < > 1.47* 1.60* 1.48*  CALCIUM 8.1*   < > 8.1*   < > 7.7*   < > 7.8* 8.0* 8.2*  PROT 4.9*  --  5.2*  --  5.0*  --   --   --   --   ALBUMIN 2.6*  --  2.7*  --  2.5*  --   --   --   --  AST 19  --  14*  --  16  --   --   --   --   ALT 29  --  30  --  27  --   --   --   --   ALKPHOS 50  --  52  --  49  --   --   --   --   BILITOT 1.5*  --  1.2  --  1.5*  --   --   --   --   GFRNONAA 34*   < > 37*   < > 40*   < > 47* 42* 47*  GFRAA 40*   < > 42*   < > 47*   < > 54* 49* 54*  ANIONGAP 10   < > 8   < > 9   < > 9 12 9    < > = values in this interval not displayed.     Hematology Recent Labs  Lab 02/09/20 0205 02/10/20 0153 02/11/20 0206  WBC 9.2 6.9 8.6  RBC 4.78 4.73 4.77  HGB 15.0 15.0 15.1  HCT 46.5 46.4 46.8  MCV 97.3 98.1 98.1  MCH 31.4 31.7 31.7  MCHC 32.3 32.3 32.3  RDW 15.1 15.2 15.2  PLT 129* 129* 131*    BNP Recent Labs  Lab 02/05/20 1802  BNP 789.9*     DDimer  Recent Labs  Lab 02/06/20 0640  DDIMER 1.92*     Radiology    No results found.  Cardiac Studies   Echo-EF 20 to 25%  Patient Profile     74 y.o. male with persistent atrial fibrillation, failed cardioversion, back on amiodarone with heart failure systolic EF 02%.  Assessment & Plan    Persistent atrial fibrillation -Cardioversion failed on 02/08/2020.  He was then placed on IV amiodarone and now on p.o. amiodarone.  For simplification, I think it makes sense to place him on 200 mg twice a day on discharge given adequate load here in  the hospital setting. -Would plan on repeat cardioversion attempt in 4 weeks.  If unsuccessful consider CRT again with left bundle branch block with EP consultation. -Continue with Eliquis.  Acute on chronic systolic heart failure -Had some difficulty with hypotension.  Currently doing fairly well, laying comfortably.  No significant orthopnea.  No longer able to support either spironolactone or ACE inhibitor.  Carvedilol currently low-dose.  Okay with discharge later today. We will have follow-up with Dr. Meda Coffee or APP in clinic.     For questions or updates, please contact [place] Please consult [email or web address] for contact info under        Signed, Candee Furbish, MD  02/12/2020, 8:45 AM

## 2020-02-12 NOTE — Care Management (Signed)
Per Royston Cowper. [facility] @[phone]  Co-pay amount for Entresto 24-26 mg. bid for 30 day supply $203.83. Co-pay amount for Farxiga 30m daily for 30 day supply $203.83   No PA required Deductible not met. [facility].

## 2020-02-12 NOTE — TOC Transition Note (Addendum)
Transition of Care [facility]) - CM/SW Discharge Note   Patient Details  Name: Guerino Caporale MRN: [id number] Date of Birth: 1946/03/23  [facility] CM/SW Contact:  Leone Haven, RN Phone Number: 02/12/2020, 9:53 AM   Clinical Narrative:    NCM received notification from [place] with [facility] stating they can not take referral, found out insurance is not in network with them.  NCM informed patient, and asked if he wanted another agency for the HHPT, he states no he does not want HHPT.  NCM asked [facility] if this insurance was in network with them ,awaiting to hear back. Per Kandee Keen with [facility] he can take patient, NCM informed patient of this information, he still states he does not want HHPT.    Final next level of care: Home w Home Health Services Barriers to Discharge: No Barriers Identified   Patient Goals and CMS Choice Patient states their goals for this hospitalization and ongoing recovery are:: get better CMS Medicare.gov Compare Post Acute Care list provided to:: Patient Choice offered to / list presented to : Patient  Discharge Placement                       Discharge Plan and Services   Discharge Planning Services: CM Consult Post Acute Care Choice: Home Health            DME Agency: NA       HH Arranged: PT, Refused [facility] HH Agency: [facility] Date [facility] Agency Contacted: 02/07/20 Time HH Agency Contacted: [phone] Representative spoke with at [facility] Agency: [place]  Social Determinants of Health (SDOH) Interventions     Readmission Risk Interventions No flowsheet data found.

## 2020-02-12 NOTE — Discharge Summary (Addendum)
Physician Discharge Summary  Robert Sperl ZOX:[id number] DOB: Aug 11, 1946 DOA: 02/05/2020  PCP: Burton Apley, MD  Admit date: 02/05/2020 Discharge date: 02/12/2020  Admitted From: Home Disposition: Home  Recommendations for Outpatient Follow-up:  1. Follow up with PCP in 1-2 weeks 2. Follow-up with cardiology, Dr. Delton See in 1-2 weeks following hospitalization 3. Started on amiodarone, carvedilol, Eliquis, furosemide for decompensated CHF with A. fib with RVR 4. Discontinue lisinopril secondary to borderline hypotension and need for rate control 5. Please obtain BMP in one week to assess renal function and potassium 6. Recommend repeat CT chest in 2-3 months for incidental findings found chest CT on admission.  Home Health: PT Equipment/Devices: 3 : 1 bedside commode, oxygen, 2 L per nasal cannula  Discharge Condition: Stable CODE STATUS: Full code Diet recommendation: Heart healthy, low sodium diet  History of present illness:  Bryan Moran is a 74 year old Caucasian male with past medical history remarkable for chronic combined systolic/diastolic congestive heart failure last EF 25-30% with previous refusal of ICD, chronic left bundle branch block, essential hypertension, paroxysmal/persistent atrial fibrillation CKD stage III who presented with progressive shortness of breath, weakness.  In the ER patient was in A. fib with RVR blood pressure was fluctuating between normal to very high and was started on Cardizem infusion. Due to mildly elevated lactate and low normal blood pressure final cc normal saline bolus was given for which blood pressure has remained stable. Patient's creatinine has increased from 1.1 about 3 years ago to 1.70. LFTs are normal abdomen appears benign. Chest x-ray showing features concerning for left hilar mass with upper lobe density for which CT chest was recommended and CT chest showed bilateral hilar airspace disease for which follow-up was recommended. Patient  is afebrile denies any productive cough fever or chills. No definite signs of any pneumonia. BNP was elevated at 789 high sensitive troponin was 75-81 lactic acid was 2.4 going improved to 2.1 CBC was unremarkable. Mild lower extremity edema. Stool studies have been ordered. Patient admitted for A. fib with RVR with shortness of breath which likely could be from CHF.   Hospital course:  Acute hypoxic respiratory failure: Resolved Acute on chronic decompensated combined systolic/diastolic CHF exacerbation Patient presenting with progressive shortness of breath, history of medical noncompliance.  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  BNP elevated 789.  TTE with EF 20-25%, severely decreased LV function with global hypokinesis and septal dyssynchrony, severely dilated LV, RV function mildly reduced, LA severely dilated with normal IVC and grade 2 diastolic dysfunction.  Cardiology was consulted and followed during hospital course.  Patient was initially started on IV furosemide and transition to 40 mg p.o. twice daily at discharge.  Continue carvedilol 3.125 mg p.o. twice daily.  Patient was titrated off of supplemental oxygen while at rest but did desaturate on ambulation to 83-86%, will discharge on home supplemental oxygen at 2 L per nasal cannula.  Patient encouraged to maintain daily weight log and to avoid his orange sodas that he normally drinks and adhere to a low-salt diet.  Paroxysmal/permanent atrial fibrillation with rapid ventricular response Etiology likely secondary to noncompliance with medication regimen.  Previous was on amiodarone and he has stopped for some time now with unclear reason. Underwent DCCV on 02/08/2020 with initial conversion to NSR but unfortunately patient went back into atrial fibrillation 24 hours following cardioversion.  Patient was started on amiodarone drip which is now  converted  to 400 mg twice daily for 7 days followed by 200 mg twice daily.  Continues on carvedilol 3.125 mg p.o. twice daily and Eliquis for anticoagulation. Cardiology can reconsider repeat DCCV in 4 weeks if remains in A. Fib; with consideration of EP evaluation for CRT with wide LBBB if repeat DCCV failed.   Hypokalemia Repleted during hospitalization.  Continue potassium supplement on discharge.  Recommend repeat BMP in 1 week.  Acute on chronic kidney disease stage IIIa Baseline creatinine 1.3.  Creatinine on admission 1.73 and peaked at 1.90 during the hospitalization.  Patient's home lisinopril was discontinued.  Patient was started on furosemide and will discharge on 40 mg p.o. twice daily.  Creatinine improved to 1.48 at time of discharge.  Recommend repeat BMP in 1 week.  Generalized weakness, Deconditioning Patient currently lives at home alone, no significant family support close as sister lives 90 miles away in [place].  Evaluated by PT recommends home health.   Patchy airspace disease, right upper lobe/anterior left hilum Incidental finding on CT chest.  Will need follow-up CT chest in 2-3 months to ensure resolution as underlying neoplastic process cannot be excluded at this time.  Discharge Diagnoses:  Active Problems:   Chronic systolic dysfunction of left ventricle   HTN (hypertension)   Atrial fibrillation with RVR (HCC)   AKI (acute kidney injury) [facility])    Discharge Instructions  Discharge Instructions    (HEART FAILURE PATIENTS) Call MD:  Anytime you have any of the following symptoms: 1) 3 pound weight gain in 24 hours or 5 pounds in 1 week 2) shortness of breath, with or without a dry hacking cough 3) swelling in the hands, feet or stomach 4) if you have to sleep on extra pillows at night in order to breathe.   Complete by: As directed    Amb Referral to Cardiac Rehabilitation   Complete by: As directed    Diagnosis: Heart Failure (see criteria below if  ordering Phase II)   Heart Failure Type: Chronic Systolic & Diastolic   After initial evaluation and assessments completed: Virtual Based Care may be provided alone or in conjunction with Phase 2 Cardiac Rehab based on patient barriers.: Yes   Call MD for:  difficulty breathing, headache or visual disturbances   Complete by: As directed    Call MD for:  extreme fatigue   Complete by: As directed    Call MD for:  persistant dizziness or light-headedness   Complete by: As directed    Call MD for:  persistant nausea and vomiting   Complete by: As directed    Call MD for:  severe uncontrolled pain   Complete by: As directed    Call MD for:  temperature >100.4   Complete by: As directed    Diet - low sodium heart healthy   Complete by: As directed    Increase activity slowly   Complete by: As directed      Allergies as of 02/12/2020   No Known Allergies     Medication List    STOP taking these medications   lisinopril 40 MG tablet Commonly known as: ZESTRIL   spironolactone 25 MG tablet Commonly known as: ALDACTONE     TAKE these medications   amiodarone 200 MG tablet Commonly known as: Pacerone Take 2 tablets (400 mg total) by mouth 2 (two) times daily for 4 days, THEN 1 tablet (200 mg total) 2 (two) times daily. Start taking on: Feb 12, 2020   apixaban 5  MG Tabs tablet Commonly known as: ELIQUIS Take 1 tablet (5 mg total) by mouth 2 (two) times daily.   carvedilol 3.125 MG tablet Commonly known as: COREG Take 1 tablet (3.125 mg total) by mouth 2 (two) times daily with a meal. What changed:   medication strength  how much to take   clotrimazole-betamethasone cream Commonly known as: LOTRISONE Apply 1 application topically daily.   ELETONE EX Apply 1 application topically 2 (two) times daily as needed (eczema).   furosemide 40 MG tablet Commonly known as: LASIX Take 1 tablet (40 mg total) by mouth 2 (two) times daily. What changed:   when to take  this  reasons to take this   pantoprazole 40 MG tablet Commonly known as: PROTONIX Take 1 tablet (40 mg total) by mouth daily.   potassium chloride SA 20 MEQ tablet Commonly known as: KLOR-CON Take 1 tablet (20 mEq total) by mouth daily.   triamcinolone cream 0.1 % Commonly known as: KENALOG SMARTSIG:1 Application Topical 2-3 Times Daily            Durable Medical Equipment  (From admission, onward)         Start     Ordered   02/12/20 1027  For home use only DME oxygen  Once    Question Answer Comment  Length of Need Lifetime   Mode or (Route) Nasal cannula   Liters per Minute 2   Oxygen delivery system Gas      02/12/20 1027   02/10/20 1043  For home use only DME 3 n 1  Once     02/10/20 1042         Follow-up Information    Burton Apley, MD. Schedule an appointment as soon as possible for a visit in 1 week(s).   Specialty: Internal Medicine Contact information: [address] [place] [address] [phone]        Lars Masson, MD. Schedule an appointment as soon as possible for a visit in 1 week(s).   Specialty: Cardiology Contact information: [address] [place] [address] [phone]          No Known Allergies  Consultations:  Cardiology, Dr. Eden Emms, Dr. Anne Fu   Procedures/Studies: CT Chest Wo Contrast  Result Date: 02/05/2020 CLINICAL DATA:  Shortness of breath. EXAM: CT CHEST WITHOUT CONTRAST TECHNIQUE: Multidetector CT imaging of the chest was performed following the standard protocol without IV contrast. COMPARISON:  Chest plain film, dated Feb 05, 2020. FINDINGS: Cardiovascular: There is mild calcification of the aortic arch. There is mild to moderate severity cardiomegaly. No pericardial effusion. Mediastinum/Nodes: No enlarged mediastinal or axillary lymph nodes. Thyroid gland, trachea, and esophagus demonstrate no significant findings. Lungs/Pleura: A 3.7 cm x 2.1 cm area of patchy airspace  disease is seen within the lateral aspect of the right upper lobe. Additional 7.3 cm x 3.9 cm patchy area of airspace disease is seen along the anterior aspect of the left hilum. This corresponds to the area of abnormality seen within this region on the prior chest plain film. Small patchy areas of airspace disease are seen bilateral lower lobes and posteromedial aspect of the right upper lobe. There are small bilateral pleural effusions. No pneumothorax is identified. Upper Abdomen: Metallic density surgical coils are seen within the right upper quadrant. Musculoskeletal: Multilevel degenerative changes seen throughout the thoracic spine. IMPRESSION: 1. Patchy bilateral areas of airspace disease, most pronounced within the lateral aspect of the right upper lobe and along  the anterior aspect of the left hilum. Follow-up to resolution is recommended, as the presence of an underlying neoplastic process cannot be excluded. 2. Small bilateral pleural effusions. Aortic Atherosclerosis (ICD10-I70.0). Electronically Signed   By: Aram Candela M.D.   On: 02/05/2020 22:05   DG Chest Portable 1 View  Result Date: 02/05/2020 CLINICAL DATA:  Atrial fibrillation with rapid ventricular response EXAM: PORTABLE CHEST 1 VIEW COMPARISON:  06/02/2014 FINDINGS: Two frontal views of the chest demonstrate an enlarged cardiac silhouette with prominent left atrial dilatation. Increased density at the left hilum could reflect left suprahilar consolidation, though underlying mass could be considered. No effusion or pneumothorax. No acute bony abnormalities. IMPRESSION: 1. Left hilar density, differential includes left upper lobe consolidation versus left hilar mass. Follow-up chest CT without IV contrast may be useful when clinical situation permits. 2. Cardiomegaly with enlarged left atrium, stable. Electronically Signed   By: Sharlet Salina M.D.   On: 02/05/2020 19:13   ECHOCARDIOGRAM COMPLETE  Result Date: 02/06/2020     ECHOCARDIOGRAM REPORT   Patient Name:   [facility]   Date of Exam: 02/06/2020 Medical Rec #:  [id number]  Height:       75.0 in Accession #:    [id number] Weight:       247.0 lb Date of Birth:  02-23-1946 BSA:          2.400 m Patient Age:    73 years   BP:           101/81 mmHg Patient Gender: M          HR:           99 bpm. Exam Location:  Inpatient Procedure: 2D Echo, Cardiac Doppler and Color Doppler Indications:    Atrial fibrillation  History:        Patient has prior history of Echocardiogram examinations, most                 recent 11/27/2014. CHF, Arrythmias:Atrial Fibrillation and LBBB,                 Signs/Symptoms:Shortness of Breath; Risk Factors:Hypertension.  Sonographer:    Lavenia Atlas Referring Phys: [phone] ARSHAD N KAKRAKANDY IMPRESSIONS  1. Left ventricular ejection fraction, by estimation, is 20 to 25%. The left ventricle has severely decreased function. The left ventricle demonstrates global hypokinesis with significant septal dyssynchrony. The left ventricular internal cavity size was severely dilated. There is mild concentric left ventricular hypertrophy.  2. Right ventricular systolic function is moderately reduced. The right ventricular size is moderately enlarged. There is normal pulmonary artery systolic pressure. The estimated right ventricular systolic pressure is 26.2 mmHg.  3. Left atrial size was severely dilated.  4. The mitral valve is normal in structure. Mild mitral valve regurgitation. No evidence of mitral stenosis.  5. The aortic valve is normal in structure. Aortic valve regurgitation is mild. No aortic stenosis is present.  6. The inferior vena cava is normal in size with greater than 50% respiratory variability, suggesting right atrial pressure of 3 mmHg.  7. Right atrial size was mildly dilated. FINDINGS  Left Ventricle: Left ventricular ejection fraction, by estimation, is <20%. The left ventricle has severely decreased function. The left ventricle demonstrates global  hypokinesis. The left ventricular internal cavity size was severely dilated. There is mild concentric left ventricular hypertrophy. Abnormal (paradoxical) septal motion, consistent with left bundle branch block. Left ventricular diastolic parameters are consistent with Grade II diastolic dysfunction (pseudonormalization). Elevated left atrial pressure.  Right Ventricle: The right ventricular size is moderately enlarged. No increase in right ventricular wall thickness. Right ventricular systolic function is moderately reduced. There is normal pulmonary artery systolic pressure. The tricuspid regurgitant velocity is 2.41 m/s, and with an assumed right atrial pressure of 3 mmHg, the estimated right ventricular systolic pressure is 66.4 mmHg. Left Atrium: Left atrial size was severely dilated. Right Atrium: Right atrial size was mildly dilated. Pericardium: Trivial pericardial effusion is present. There is no evidence of cardiac tamponade. Mitral Valve: The mitral valve is normal in structure. Normal mobility of the mitral valve leaflets. Mild mitral valve regurgitation, with centrally-directed jet. No evidence of mitral valve stenosis. Tricuspid Valve: The tricuspid valve is normal in structure. Tricuspid valve regurgitation is mild . No evidence of tricuspid stenosis. Aortic Valve: The aortic valve is normal in structure. Aortic valve regurgitation is mild. No aortic stenosis is present. Pulmonic Valve: The pulmonic valve was normal in structure. Pulmonic valve regurgitation is not visualized. No evidence of pulmonic stenosis. Aorta: The aortic root is normal in size and structure. Venous: The inferior vena cava is normal in size with greater than 50% respiratory variability, suggesting right atrial pressure of 3 mmHg. IAS/Shunts: No atrial level shunt detected by color flow Doppler.  LEFT VENTRICLE PLAX 2D LVIDd:         7.20 cm  Diastology LVIDs:         6.50 cm  LV e' lateral:   4.24 cm/s LV PW:         1.10 cm  LV  E/e' lateral: 19.0 LV IVS:        1.10 cm  LV e' medial:    4.79 cm/s LVOT diam:     2.70 cm  LV E/e' medial:  16.8 LV SV:         64 LV SV Index:   26 LVOT Area:     5.73 cm  RIGHT VENTRICLE RV Basal diam:  4.20 cm RV S prime:     9.36 cm/s TAPSE (M-mode): 2.5 cm LEFT ATRIUM             Index       RIGHT ATRIUM           Index LA diam:        5.60 cm 2.33 cm/m  RA Area:     19.40 cm LA Vol (A2C):   71.5 ml 29.79 ml/m RA Volume:   48.20 ml  20.08 ml/m LA Vol (A4C):   89.1 ml 37.12 ml/m LA Biplane Vol: 88.0 ml 36.66 ml/m  AORTIC VALVE LVOT Vmax:   69.20 cm/s LVOT Vmean:  43.300 cm/s LVOT VTI:    0.111 m  AORTA Ao Root diam: 3.30 cm MITRAL VALVE               TRICUSPID VALVE MV Area (PHT): 4.80 cm    TR Peak grad:   23.2 mmHg MV Decel Time: 158 msec    TR Vmax:        241.00 cm/s MV E velocity: 80.60 cm/s MV A velocity: 47.60 cm/s  SHUNTS MV E/A ratio:  1.69        Systemic VTI:  0.11 m                            Systemic Diam: 2.70 cm Ena Dawley MD Electronically signed by Ena Dawley MD Signature Date/Time: 02/06/2020/4:14:29 PM    Final  ECHO TEE  Result Date: 02/08/2020    TRANSESOPHOGEAL ECHO REPORT   Patient Name:   [facility]   Date of Exam: 02/08/2020 Medical Rec #:  [id number]  Height:       75.0 in Accession #:    [id number] Weight:       245.6 lb Date of Birth:  July 04, 1946 BSA:          2.395 m Patient Age:    73 years   BP:           100/65 mmHg Patient Gender: M          HR:           93 bpm. Exam Location:  Inpatient Procedure: Transesophageal Echo, Cardiac Doppler and Color Doppler Indications:     Atrial fibrillation  History:         Patient has prior history of Echocardiogram examinations, most                  recent 02/06/2020. CHF, Arrythmias:Atrial Fibrillation; Risk                  Factors:Hypertension.  Sonographer:     Ross Ludwig RDCS (AE) Referring Phys:  [id number] Wendall Stade Diagnosing Phys: Chilton Si MD PROCEDURE: After discussion of the risks and benefits of a TEE,  an informed consent was obtained from the patient. The transesophogeal probe was passed without difficulty through the esophogus of the patient. Local oropharyngeal anesthetic was provided with Cetacaine. Sedation performed by different physician. Image quality was adequate. The patient's vital signs; including heart rate, blood pressure, and oxygen saturation; remained stable throughout the procedure. The patient developed no complications during the procedure. A successful direct current cardioversion was performed at 150 joules with 1 attempt. IMPRESSIONS  1. Left ventricular ejection fraction, by estimation, is 20 to 25%. The left ventricle has severely decreased function. The left ventricle demonstrates global hypokinesis.  2. Right ventricular systolic function is normal. The right ventricular size is normal. There is normal pulmonary artery systolic pressure.  3. Prominent left atrial appendage trabeculations without evidence of thrombus. No left atrial/left atrial appendage thrombus was detected. The LAA emptying velocity was 28 cm/s.  4. The mitral valve is normal in structure. Moderate mitral valve regurgitation. No evidence of mitral stenosis.  5. The aortic valve is tricuspid. Aortic valve regurgitation is mild. No aortic stenosis is present.  6. The inferior vena cava is normal in size with greater than 50% respiratory variability, suggesting right atrial pressure of 3 mmHg. FINDINGS  Left Ventricle: Left ventricular ejection fraction, by estimation, is 20 to 25%. The left ventricle has severely decreased function. The left ventricle demonstrates global hypokinesis. The left ventricular internal cavity size was normal in size. There is no left ventricular hypertrophy. Right Ventricle: The right ventricular size is normal. No increase in right ventricular wall thickness. Right ventricular systolic function is normal. There is normal pulmonary artery systolic pressure. The tricuspid regurgitant velocity  is 2.14 m/s, and  with an assumed right atrial pressure of 10 mmHg, the estimated right ventricular systolic pressure is 28.3 mmHg. Left Atrium: Prominent left atrial appendage trabeculations without evidence of thrombus. Left atrial size was normal in size. No left atrial/left atrial appendage thrombus was detected. The LAA emptying velocity was 28 cm/s. Right Atrium: Right atrial size was normal in size. Pericardium: There is no evidence of pericardial effusion. Mitral Valve: Multiple mitral valve regurgitant jets. The mitral valve is normal in  structure. Normal mobility of the mitral valve leaflets. Moderate mitral valve regurgitation. No evidence of mitral valve stenosis. Tricuspid Valve: The tricuspid valve is normal in structure. Tricuspid valve regurgitation is mild . No evidence of tricuspid stenosis. Aortic Valve: The aortic valve is tricuspid. Aortic valve regurgitation is mild. No aortic stenosis is present. Pulmonic Valve: The pulmonic valve was normal in structure. Pulmonic valve regurgitation is mild. No evidence of pulmonic stenosis. Aorta: The aortic root is normal in size and structure. Venous: The inferior vena cava is normal in size with greater than 50% respiratory variability, suggesting right atrial pressure of 3 mmHg. IAS/Shunts: No atrial level shunt detected by color flow Doppler.  MR Peak grad:    42.2 mmHg   TRICUSPID VALVE MR Mean grad:    21.0 mmHg   TR Peak grad:   18.3 mmHg MR Vmax:         325.00 cm/s TR Vmax:        214.00 cm/s MR Vmean:        200.0 cm/s MR PISA:         4.02 cm MR PISA Eff ROA: 43 mm MR PISA Radius:  0.80 cm Chilton Si MD Electronically signed by Chilton Si MD Signature Date/Time: 02/08/2020/3:20:37 PM    Final      Subjective: Patient seen and examined bedside, resting comfortably in bedside chair and walking around room.  Patient states ready for discharge home.  No specific complaints or concerns at this time.  Denies headache, no  fever/chills/night sweats, no nausea/vomit/diarrhea, no chest pain, no palpitations, no shortness of breath, no cough/congestion, no abdominal pain.  No acute events overnight per nursing staff.  Discharge Exam: Vitals:   02/11/20 2338 02/12/20 0700  BP: 119/84 114/82  Pulse: (!) 106 93  Resp:  20  Temp: (!) 97.5 F (36.4 C) 98.2 F (36.8 C)  SpO2:  95%   Vitals:   02/11/20 2125 02/11/20 2338 02/12/20 0455 02/12/20 0700  BP: 100/78 119/84  114/82  Pulse: (!) 111 (!) 106  93  Resp: 18   20  Temp: (!) 97.4 F (36.3 C) (!) 97.5 F (36.4 C)  98.2 F (36.8 C)  TempSrc: Oral Oral  Oral  SpO2: 94%   95%  Weight:   111.9 kg   Height:        General: Pt is alert, awake, not in acute distress Cardiovascular: Irregularly irregular rhythm, S1/S2 +, no rubs, no gallops Respiratory: CTA bilaterally, no wheezing, no rhonchi Abdominal: Soft, NT, ND, bowel sounds + Extremities: 2+ pitting edema bilateral lower extremities, no cyanosis    The results of significant diagnostics from this hospitalization (including imaging, microbiology, ancillary and laboratory) are listed below for reference.     Microbiology: Recent Results (from the past 240 hour(s))  SARS Coronavirus 2 by RT PCR (hospital order, performed in [facility] hospital lab) Nasopharyngeal Nasopharyngeal Swab     Status: None   Collection Time: 02/05/20  6:36 PM   Specimen: Nasopharyngeal Swab  Result Value Ref Range Status   SARS Coronavirus 2 NEGATIVE NEGATIVE Final    Comment: (NOTE) SARS-CoV-2 target nucleic acids are NOT DETECTED. The SARS-CoV-2 RNA is generally detectable in upper and lower respiratory specimens during the acute phase of infection. The lowest concentration of SARS-CoV-2 viral copies this assay can detect is 250 copies / mL. A negative result does not preclude SARS-CoV-2 infection and should not be used as the sole basis for treatment or other patient management  decisions.  A negative result may  occur with improper specimen collection / handling, submission of specimen other than nasopharyngeal swab, presence of viral mutation(s) within the areas targeted by this assay, and inadequate number of viral copies (<250 copies / mL). A negative result must be combined with clinical observations, patient history, and epidemiological information. Fact Sheet for Patients:   BoilerBrush.com.cy Fact Sheet for Healthcare Providers: https://pope.com/ This test is not yet approved or cleared  by the [place] FDA and has been authorized for detection and/or diagnosis of SARS-CoV-2 by FDA under an Emergency Use Authorization (EUA).  This EUA will remain in effect (meaning this test can be used) for the duration of the COVID-19 declaration under Section 564(b)(1) of the Act, 21 U.S.C. section 360bbb-3(b)(1), unless the authorization is terminated or revoked sooner. Performed at [facility], [address] [place], [place] [address]   Culture, blood (routine x 2)     Status: None   Collection Time: 02/05/20 11:05 PM   Specimen: BLOOD LEFT HAND  Result Value Ref Range Status   Specimen Description BLOOD LEFT HAND  Final   Special Requests   Final    BOTTLES DRAWN AEROBIC AND ANAEROBIC Blood Culture adequate volume   Culture   Final    NO GROWTH 5 DAYS Performed at [facility], [address], [place], [place] [address]    Report Status 02/10/2020 FINAL  Final  Urine culture     Status: None   Collection Time: 02/06/20  1:48 AM   Specimen: Urine, Random  Result Value Ref Range Status   Specimen Description URINE, RANDOM  Final   Special Requests NONE  Final   Culture   Final    NO GROWTH Performed at [facility], [address], [place], [place] [address]    Report Status 02/06/2020 FINAL  Final  Culture, blood (routine x 2)     Status: None   Collection Time: 02/06/20  5:10 AM   Specimen: BLOOD  Result Value  Ref Range Status   Specimen Description BLOOD LEFT HAND  Final   Special Requests   Final    BOTTLES DRAWN AEROBIC AND ANAEROBIC Blood Culture adequate volume   Culture   Final    NO GROWTH 5 DAYS Performed at [facility], [address] [place], [place] [address]    Report Status 02/11/2020 FINAL  Final  MRSA PCR Screening     Status: None   Collection Time: 02/06/20  3:47 PM   Specimen: Nasopharyngeal  Result Value Ref Range Status   MRSA by PCR NEGATIVE NEGATIVE Final    Comment:        The GeneXpert MRSA Assay (FDA approved for NASAL specimens only), is one component of a comprehensive MRSA colonization surveillance program. It is not intended to diagnose MRSA infection nor to guide or monitor treatment for MRSA infections. Performed at [facility], [address], [place], [place] [address]   C Difficile Quick Screen w PCR reflex     Status: None   Collection Time: 02/06/20  5:53 PM   Specimen: STOOL  Result Value Ref Range Status   C Diff antigen NEGATIVE NEGATIVE Final   C Diff toxin NEGATIVE NEGATIVE Final   C Diff interpretation No C. difficile detected.  Final    Comment: Performed at [facility], [address], [place], [place] [address]     Labs: BNP (last 3 results)  Recent Labs    02/05/20 1802  BNP 789.9*   Basic Metabolic Panel: Recent Labs  Lab 02/07/20 0217 02/07/20 0217 02/08/20 0243 02/09/20 0205 02/10/20 0153 02/11/20 0206 02/12/20 0216  NA 140   < > 139 140 140 141 140  K 4.2   < > 3.5 3.3* 4.0 3.8 3.9  CL 112*   < > 109 107 108 104 107  CO2 18*   < > 22 24 23 25 24   GLUCOSE 122*   < > 112* 119* 103* 106* 125*  BUN 36*   < > 37* 39* 33* 33* 34*  CREATININE 1.90*   < > 1.81* 1.67* 1.47* 1.60* 1.48*  CALCIUM 8.1*   < > 8.1* 7.7* 7.8* 8.0* 8.2*  MG 2.3  --  2.1 2.0 1.9 1.9  --    < > = values in this interval not displayed.   Liver Function Tests: Recent Labs  Lab 02/05/20 1800 02/06/20 0510 02/07/20 0217  02/08/20 0243 02/09/20 0205  AST 28 21 19  14* 16  ALT 41 35 29 30 27   ALKPHOS 52 53 50 52 49  BILITOT 2.0* 2.7* 1.5* 1.2 1.5*  PROT 5.1* 5.0* 4.9* 5.2* 5.0*  ALBUMIN 2.8* 2.7* 2.6* 2.7* 2.5*   Recent Labs  Lab 02/05/20 1800  LIPASE 28   No results for input(s): AMMONIA in the last 168 hours. CBC: Recent Labs  Lab 02/07/20 0217 02/08/20 0243 02/09/20 0205 02/10/20 0153 02/11/20 0206  WBC 8.4 7.8 9.2 6.9 8.6  HGB 15.3 15.3 15.0 15.0 15.1  HCT 47.9 47.7 46.5 46.4 46.8  MCV 99.6 97.9 97.3 98.1 98.1  PLT 138* 137* 129* 129* 131*   Cardiac Enzymes: No results for input(s): CKTOTAL, CKMB, CKMBINDEX, TROPONINI in the last 168 hours. BNP: Invalid input(s): POCBNP CBG: No results for input(s): GLUCAP in the last 168 hours. D-Dimer No results for input(s): DDIMER in the last 72 hours. Hgb A1c No results for input(s): HGBA1C in the last 72 hours. Lipid Profile No results for input(s): CHOL, HDL, LDLCALC, TRIG, CHOLHDL, LDLDIRECT in the last 72 hours. Thyroid function studies No results for input(s): TSH, T4TOTAL, T3FREE, THYROIDAB in the last 72 hours.  Invalid input(s): FREET3 Anemia work up No results for input(s): VITAMINB12, FOLATE, FERRITIN, TIBC, IRON, RETICCTPCT in the last 72 hours. Urinalysis    Component Value Date/Time   COLORURINE AMBER (A) 02/06/2020 0148   APPEARANCEUR HAZY (A) 02/06/2020 0148   LABSPEC 1.026 02/06/2020 0148   PHURINE 5.0 02/06/2020 0148   GLUCOSEU NEGATIVE 02/06/2020 0148   HGBUR SMALL (A) 02/06/2020 0148   BILIRUBINUR NEGATIVE 02/06/2020 0148   KETONESUR NEGATIVE 02/06/2020 0148   PROTEINUR 30 (A) 02/06/2020 0148   NITRITE NEGATIVE 02/06/2020 0148   LEUKOCYTESUR NEGATIVE 02/06/2020 0148   Sepsis Labs Invalid input(s): PROCALCITONIN,  WBC,  LACTICIDVEN Microbiology Recent Results (from the past 240 hour(s))  SARS Coronavirus 2 by RT PCR (hospital order, performed in [facility] hospital lab) Nasopharyngeal Nasopharyngeal Swab      Status: None   Collection Time: 02/05/20  6:36 PM   Specimen: Nasopharyngeal Swab  Result Value Ref Range Status   SARS Coronavirus 2 NEGATIVE NEGATIVE Final    Comment: (NOTE) SARS-CoV-2 target nucleic acids are NOT DETECTED. The SARS-CoV-2 RNA is generally detectable in upper and lower respiratory specimens during the acute phase of infection. The lowest concentration of SARS-CoV-2 viral copies this assay can detect is 250 copies / mL. A negative result does not preclude SARS-CoV-2 infection  and should not be used as the sole basis for treatment or other patient management decisions.  A negative result may occur with improper specimen collection / handling, submission of specimen other than nasopharyngeal swab, presence of viral mutation(s) within the areas targeted by this assay, and inadequate number of viral copies (<250 copies / mL). A negative result must be combined with clinical observations, patient history, and epidemiological information. Fact Sheet for Patients:   BoilerBrush.com.cy Fact Sheet for Healthcare Providers: https://pope.com/ This test is not yet approved or cleared  by the [place] FDA and has been authorized for detection and/or diagnosis of SARS-CoV-2 by FDA under an Emergency Use Authorization (EUA).  This EUA will remain in effect (meaning this test can be used) for the duration of the COVID-19 declaration under Section 564(b)(1) of the Act, 21 U.S.C. section 360bbb-3(b)(1), unless the authorization is terminated or revoked sooner. Performed at [facility], [address], [place], [place] [address]   Culture, blood (routine x 2)     Status: None   Collection Time: 02/05/20 11:05 PM   Specimen: BLOOD LEFT HAND  Result Value Ref Range Status   Specimen Description BLOOD LEFT HAND  Final   Special Requests   Final    BOTTLES DRAWN AEROBIC AND ANAEROBIC Blood Culture adequate volume   Culture    Final    NO GROWTH 5 DAYS Performed at [facility], [address], [place], [place] [address]    Report Status 02/10/2020 FINAL  Final  Urine culture     Status: None   Collection Time: 02/06/20  1:48 AM   Specimen: Urine, Random  Result Value Ref Range Status   Specimen Description URINE, RANDOM  Final   Special Requests NONE  Final   Culture   Final    NO GROWTH Performed at [facility], [address], [place], [place] [address]    Report Status 02/06/2020 FINAL  Final  Culture, blood (routine x 2)     Status: None   Collection Time: 02/06/20  5:10 AM   Specimen: BLOOD  Result Value Ref Range Status   Specimen Description BLOOD LEFT HAND  Final   Special Requests   Final    BOTTLES DRAWN AEROBIC AND ANAEROBIC Blood Culture adequate volume   Culture   Final    NO GROWTH 5 DAYS Performed at [facility], [address] [place], [place] [address]    Report Status 02/11/2020 FINAL  Final  MRSA PCR Screening     Status: None   Collection Time: 02/06/20  3:47 PM   Specimen: Nasopharyngeal  Result Value Ref Range Status   MRSA by PCR NEGATIVE NEGATIVE Final    Comment:        The GeneXpert MRSA Assay (FDA approved for NASAL specimens only), is one component of a comprehensive MRSA colonization surveillance program. It is not intended to diagnose MRSA infection nor to guide or monitor treatment for MRSA infections. Performed at [facility], [address] [place], [place] [address]   C Difficile Quick Screen w PCR reflex     Status: None   Collection Time: 02/06/20  5:53 PM   Specimen: STOOL  Result Value Ref Range Status   C Diff antigen NEGATIVE NEGATIVE Final   C Diff toxin NEGATIVE NEGATIVE Final   C Diff interpretation No C. difficile detected.  Final    Comment: Performed at [facility], [address], [place], [place] [address]     Time coordinating discharge: Over 30 minutes  SIGNED:   Alvira Philips [place], DO  [facility] 02/12/2020, 10:29 AM

## 2020-02-12 NOTE — Progress Notes (Signed)
PROGRESS NOTE    Bryan Moran  RDE:[id number] DOB: 1945/12/13 DOA: 02/05/2020 PCP: Burton Apley, MD    Brief Narrative:  Bryan Moran is a 74 year old Caucasian male with past medical history remarkable for chronic combined systolic/diastolic congestive heart failure last EF 25-30% with previous refusal of ICD, chronic left bundle branch block, essential hypertension, paroxysmal/persistent atrial fibrillation CKD stage III who presented with progressive shortness of breath, weakness.  In the ER patient was in A. fib with RVR blood pressure was fluctuating between normal to very high and was started on Cardizem infusion.  Due to mildly elevated lactate and low normal blood pressure final cc normal saline bolus was given for which blood pressure has remained stable.  Patient's creatinine has increased from 1.1 about 3 years ago to 1.70.  LFTs are normal abdomen appears benign.  Chest x-ray showing features concerning for left hilar mass with upper lobe density for which CT chest was recommended and CT chest showed bilateral hilar airspace disease for which follow-up was recommended.  Patient is afebrile denies any productive cough fever or chills.  No definite signs of any pneumonia.  BNP was elevated at 789 high sensitive troponin was 75-81 lactic acid was 2.4 going improved to 2.1 CBC was unremarkable.  Mild lower extremity edema.  Stool studies have been ordered.  Patient admitted for A. fib with RVR with shortness of breath which likely could be from CHF.    Assessment & Plan:   Active Problems:   Chronic systolic dysfunction of left ventricle   HTN (hypertension)   Atrial fibrillation with RVR (HCC)   AKI (acute kidney injury) (HCC)   Acute hypoxic respiratory failure Acute on chronic decompensated combined systolic/diastolic CHF exacerbation Patient presenting with progressive shortness of breath, history of medical noncompliance.  Has refused ICD placement for septal disc and synchrony in  the past and has been noncompliant with his cardiac/CHF medications.  BNP elevated 789.  TTE with EF 20-25%, severely decreased LV function with global hypokinesis and septal dyssynchrony, severely dilated LV, RV function mildly reduced, LA severely dilated with normal IVC and grade 2 diastolic dysfunction. --Cardiology following, appreciate assistance --Coreg 3.125 mg p.o. twice daily --Continue furosemide 40 mg p.o.BID --Unable to tolerate spironolactone ACE inhibitor secondary to hypotension --Strict I's and O's and daily weights  Paroxysmal/permanent atrial fibrillation with rapid ventricular response Etiology likely secondary to noncompliance with medication regimen.  Previous was on amiodarone and he has stopped for some time now with unclear reason.  Underwent DCCV on 02/08/2020 with initial conversion to NSR but now back in atrial fibrillation. --Amiodarone drip gtt converted to 400mg  BID x 7d, then 200mg  BID --Coreg 3.125mg  PO BID   --Continue Eliquis for anticoagulation --Cardiology can reconsider repeat DCCV in 4 weeks if remains in A. Fib; consider EP evaluation for CRT with wide LBBB if repeat DCCV failed --Continue monitor on telemetry  Hypokalemia Potassium 3.9 --Monitor electrolytes daily  Acute on chronic kidney disease stage IIIa Baseline creatinine 1.3 --Cr 1.73>1.36>1.90>1.81>1.67>1.47>1.60>1.48 --Patient's home lisinopril discontinued --Continue furosemide 40 mg p.o. twice daily --Continue to monitor renal function closely with diuresis  Generalized weakness, Deconditioning Patient currently lives at home alone, no significant family support close as sister lives 90 miles away in .  Evaluated by PT recommends home health.  OT with no recommendations.  Patchy airspace disease, right upper lobe/anterior left hilum Incidental finding on CT chest.  Will need follow-up CT chest in 2-3 months to ensure resolution as underlying neoplastic process cannot be  excluded  at this time.   DVT prophylaxis: Eliquis Code Status: Full code Family Communication: No family present at bedside  Disposition Plan:  Status is: Inpatient  Remains inpatient appropriate because:Hemodynamically unstable, Ongoing diagnostic testing needed not appropriate for outpatient work up, Unsafe d/c plan and IV treatments appropriate due to intensity of illness or inability to take PO    Dispo: The patient is from: Home              Anticipated d/c is to: Home              Anticipated d/c date is: 1 day              Patient currently is medically stable to d/c.  Patient refuses to discharge home today secondary to anxiety and shortness of breath, would like to wait until tomorrow morning before discharge.   Consultants:   Cardiology  Procedures:   TEE/DCCV: 02/08/2020 - Dr. Duke Salvia  Antimicrobials:   None   Subjective: Patient seen and examined at bedside, return to bedside this afternoon after being discharged earlier this morning.  Nursing concern regarding his anxiety, shortness of breath and no home help.  Patient has declined any home health services to include PT and aide.  When discussed with him further this afternoon, he would like to wait until tomorrow to decide if he will accept home health services.  Discussed with patient on several occasions that his medication noncompliance over the past 2 years with his heart failure has led to his current decompensation, he is currently optimized as best can be currently with cardiology having no further recommendations.  Discussed with patient that he will need to discharge home tomorrow or consider SNF options if he still has concerns.  Objective: Vitals:   02/12/20 0455 02/12/20 0700 02/12/20 1029 02/12/20 1200  BP:  114/82 94/69 103/85  Pulse:  93 75 (!) 101  Resp:  20  (!) 32  Temp:  98.2 F (36.8 C) 98 F (36.7 C)   TempSrc:  Oral Oral   SpO2:  95% 98% 92%  Weight: 111.9 kg     Height:         Intake/Output Summary (Last 24 hours) at 02/12/2020 1450 Last data filed at 02/12/2020 0830 Gross per 24 hour  Intake 240 ml  Output --  Net 240 ml   Filed Weights   02/09/20 0400 02/11/20 0516 02/12/20 0455  Weight: 114.4 kg 112.3 kg 111.9 kg    Examination:  General exam: Appears calm and comfortable  Respiratory system: Clear to auscultation. Respiratory effort normal.  Oxygenating well on room air Cardiovascular system: irregularly irregular rhythm, normal rate. No JVD, murmurs, rubs, gallops or clicks.  2+ pitting edema bilateral lower extremities, right greater than left Gastrointestinal system: Abdomen is nondistended, soft and nontender. No organomegaly or masses felt. Normal bowel sounds heard. Central nervous system: Alert and oriented. No focal neurological deficits. Extremities: Symmetric 5 x 5 power. Skin: No rashes, lesions or ulcers Psychiatry: Judgement and insight appear poor. Mood & affect appropriate.     Data Reviewed: I have personally reviewed following labs and imaging studies  CBC: Recent Labs  Lab 02/07/20 0217 02/08/20 0243 02/09/20 0205 02/10/20 0153 02/11/20 0206  WBC 8.4 7.8 9.2 6.9 8.6  HGB 15.3 15.3 15.0 15.0 15.1  HCT 47.9 47.7 46.5 46.4 46.8  MCV 99.6 97.9 97.3 98.1 98.1  PLT 138* 137* 129* 129* 131*   Basic Metabolic Panel: Recent Labs  Lab  02/07/20 0217 02/07/20 0217 02/08/20 0243 02/09/20 0205 02/10/20 0153 02/11/20 0206 02/12/20 0216  NA 140   < > 139 140 140 141 140  K 4.2   < > 3.5 3.3* 4.0 3.8 3.9  CL 112*   < > 109 107 108 104 107  CO2 18*   < > 22 24 23 25 24   GLUCOSE 122*   < > 112* 119* 103* 106* 125*  BUN 36*   < > 37* 39* 33* 33* 34*  CREATININE 1.90*   < > 1.81* 1.67* 1.47* 1.60* 1.48*  CALCIUM 8.1*   < > 8.1* 7.7* 7.8* 8.0* 8.2*  MG 2.3  --  2.1 2.0 1.9 1.9  --    < > = values in this interval not displayed.   GFR: Estimated Creatinine Clearance: 60.9 mL/min (A) (by C-G formula based on SCr of 1.48  mg/dL (H)). Liver Function Tests: Recent Labs  Lab 02/05/20 1800 02/06/20 0510 02/07/20 0217 02/08/20 0243 02/09/20 0205  AST 28 21 19  14* 16  ALT 41 35 29 30 27   ALKPHOS 52 53 50 52 49  BILITOT 2.0* 2.7* 1.5* 1.2 1.5*  PROT 5.1* 5.0* 4.9* 5.2* 5.0*  ALBUMIN 2.8* 2.7* 2.6* 2.7* 2.5*   Recent Labs  Lab 02/05/20 1800  LIPASE 28   No results for input(s): AMMONIA in the last 168 hours. Coagulation Profile: No results for input(s): INR, PROTIME in the last 168 hours. Cardiac Enzymes: No results for input(s): CKTOTAL, CKMB, CKMBINDEX, TROPONINI in the last 168 hours. BNP (last 3 results) No results for input(s): PROBNP in the last 8760 hours. HbA1C: No results for input(s): HGBA1C in the last 72 hours. CBG: No results for input(s): GLUCAP in the last 168 hours. Lipid Profile: No results for input(s): CHOL, HDL, LDLCALC, TRIG, CHOLHDL, LDLDIRECT in the last 72 hours. Thyroid Function Tests: No results for input(s): TSH, T4TOTAL, FREET4, T3FREE, THYROIDAB in the last 72 hours. Anemia Panel: No results for input(s): VITAMINB12, FOLATE, FERRITIN, TIBC, IRON, RETICCTPCT in the last 72 hours. Sepsis Labs: Recent Labs  Lab 02/05/20 1802 02/05/20 2013 02/05/20 2304 02/06/20 0510  PROCALCITON  --   --  <0.10  --   LATICACIDVEN 2.4* 2.1*  --  1.3    Recent Results (from the past 240 hour(s))  SARS Coronavirus 2 by RT PCR (hospital order, performed in [facility] hospital lab) Nasopharyngeal Nasopharyngeal Swab     Status: None   Collection Time: 02/05/20  6:36 PM   Specimen: Nasopharyngeal Swab  Result Value Ref Range Status   SARS Coronavirus 2 NEGATIVE NEGATIVE Final    Comment: (NOTE) SARS-CoV-2 target nucleic acids are NOT DETECTED. The SARS-CoV-2 RNA is generally detectable in upper and lower respiratory specimens during the acute phase of infection. The lowest concentration of SARS-CoV-2 viral copies this assay can detect is 250 copies / mL. A negative result does  not preclude SARS-CoV-2 infection and should not be used as the sole basis for treatment or other patient management decisions.  A negative result may occur with improper specimen collection / handling, submission of specimen other than nasopharyngeal swab, presence of viral mutation(s) within the areas targeted by this assay, and inadequate number of viral copies (<250 copies / mL). A negative result must be combined with clinical observations, patient history, and epidemiological information. Fact Sheet for Patients:   04/07/20 Fact Sheet for Healthcare Providers: [facility] This test is not yet approved or cleared  by the 04/06/20 FDA and has  been authorized for detection and/or diagnosis of SARS-CoV-2 by FDA under an Emergency Use Authorization (EUA).  This EUA will remain in effect (meaning this test can be used) for the duration of the COVID-19 declaration under Section 564(b)(1) of the Act, 21 U.S.C. section 360bbb-3(b)(1), unless the authorization is terminated or revoked sooner. Performed at [facility], [place] [address], [place], [place] [address]   Culture, blood (routine x 2)     Status: None   Collection Time: 02/05/20 11:05 PM   Specimen: BLOOD LEFT HAND  Result Value Ref Range Status   Specimen Description BLOOD LEFT HAND  Final   Special Requests   Final    BOTTLES DRAWN AEROBIC AND ANAEROBIC Blood Culture adequate volume   Culture   Final    NO GROWTH 5 DAYS Performed at [facility], [place] [address] [place], [place] [address]    Report Status 02/10/2020 FINAL  Final  Urine culture     Status: None   Collection Time: 02/06/20  1:48 AM   Specimen: Urine, Random  Result Value Ref Range Status   Specimen Description URINE, RANDOM  Final   Special Requests NONE  Final   Culture   Final    NO GROWTH Performed at [facility], [place] [address] [place], [place] [address]     Report Status 02/06/2020 FINAL  Final  Culture, blood (routine x 2)     Status: None   Collection Time: 02/06/20  5:10 AM   Specimen: BLOOD  Result Value Ref Range Status   Specimen Description BLOOD LEFT HAND  Final   Special Requests   Final    BOTTLES DRAWN AEROBIC AND ANAEROBIC Blood Culture adequate volume   Culture   Final    NO GROWTH 5 DAYS Performed at [facility], [place] [address], [place], [place] [address]    Report Status 02/11/2020 FINAL  Final  MRSA PCR Screening     Status: None   Collection Time: 02/06/20  3:47 PM   Specimen: Nasopharyngeal  Result Value Ref Range Status   MRSA by PCR NEGATIVE NEGATIVE Final    Comment:        The GeneXpert MRSA Assay (FDA approved for NASAL specimens only), is one component of a comprehensive MRSA colonization surveillance program. It is not intended to diagnose MRSA infection nor to guide or monitor treatment for MRSA infections. Performed at [facility], [place] [address], [place], [place] [address]   C Difficile Quick Screen w PCR reflex     Status: None   Collection Time: 02/06/20  5:53 PM   Specimen: STOOL  Result Value Ref Range Status   C Diff antigen NEGATIVE NEGATIVE Final   C Diff toxin NEGATIVE NEGATIVE Final   C Diff interpretation No C. difficile detected.  Final    Comment: Performed at [facility], [place] [address] [place], [place] [address]         Radiology Studies: No results found.      Scheduled Meds: . amiodarone  400 mg Oral BID  . apixaban  5 mg Oral BID  . carvedilol  3.125 mg Oral BID WC  . clotrimazole-betamethasone  1 application Topical Daily  . furosemide  40 mg Oral BID  . pantoprazole  40 mg Oral Daily  . potassium chloride  20 mEq Oral Daily   Continuous Infusions:    LOS: 6 days    Time spent: 34 minutes  spent on chart review, discussion with nursing staff, consultants, updating family and interview/physical exam; more than 50% of that time was spent in  counseling and/or coordination of care.    Alvira Philips [place], DO [facility] Available via Epic secure chat 7am-7pm After these hours, please refer to coverage provider listed on [email or web address] 02/12/2020, 2:50 PM

## 2020-02-12 NOTE — Progress Notes (Signed)
SATURATION QUALIFICATIONS: (This note is used to comply with regulatory documentation for home oxygen)  Patient Saturations on Room Air at Rest = 91%  Patient Saturations on Room Air while Ambulating = 80-86%  Patient Saturations on 3 Liters of oxygen while Ambulating = 94%  Please briefly explain why patient needs home oxygen: Pt keeps calling nurse that he's struggling to breathe in the room. Desat 80-86% while ambulating.

## 2020-02-13 DIAGNOSIS — I10 Essential (primary) hypertension: Secondary | ICD-10-CM

## 2020-02-13 DIAGNOSIS — N179 Acute kidney failure, unspecified: Secondary | ICD-10-CM

## 2020-02-13 DIAGNOSIS — J9601 Acute respiratory failure with hypoxia: Secondary | ICD-10-CM

## 2020-02-13 NOTE — Progress Notes (Signed)
Occupational Therapy Treatment Patient Details Name: Bryan Moran MRN: [id number] DOB: 09-22-1946 Today's Date: 02/13/2020    History of present illness 74 y.o. male admitted with afib with RVR 02/05/20.  Status post cardioversion, transesophageal echocardiogram 02/08/20.  PMH CHF. HTN, CKD, venous insufficency.   OT comments  Pt continuing to progress toward stated goals. Session focused on ECS education with BADL/IADL routine to facilitate safe d/c home. Pt is having new SOB at rest and with mobility. Educated pt on rest breaks, pursed lip breathing and pacing BADL routines. Educated pt on IADL tips to maintain energy, since he is home alone and has little support. Pt plans to use a stool to prop on when cooking. Educated pt on use of grocery delivery options, or planning and pacing self strategically in grocery store. Pt able to recall ECS skill of sitting on BSC to shower and to take as needed rest breaks. Noted pt desat at rest, informed RN. D/c recs remain appropriate. Will continue to follow.    Follow Up Recommendations  No OT follow up;Supervision - Intermittent    Equipment Recommendations  3 in 1 bedside commode    Recommendations for Other Services      Precautions / Restrictions Precautions Precautions: Fall Precaution Comments: monitor HR       Mobility Bed Mobility Overal bed mobility: Independent                Transfers                      Balance Overall balance assessment: Needs assistance Sitting-balance support: No upper extremity supported;Feet supported Sitting balance-Leahy Scale: Good     Standing balance support: During functional activity;Bilateral upper extremity supported Standing balance-Leahy Scale: Fair Standing balance comment: more steady with external support                           ADL either performed or assessed with clinical judgement   ADL                                         General  ADL Comments: Session focused on ECS education and d/c planning with both BADL/IADL routines to facilitate safe d/c home     Vision Patient Visual Report: No change from baseline     Perception     Praxis      Cognition Arousal/Alertness: Awake/alert Behavior During Therapy: WFL for tasks assessed/performed Overall Cognitive Status: Within Functional Limits for tasks assessed                                 General Comments: more fatigued this date, difficulty maintaining eyes open        Exercises     Shoulder Instructions       General Comments      Pertinent Vitals/ Pain       Pain Assessment: No/denies pain  Home Living                                          Prior Functioning/Environment              Frequency  Min 2X/week  Progress Toward Goals  OT Goals(current goals can now be found in the care plan section)  Progress towards OT goals: Progressing toward goals  Acute Rehab OT Goals Patient Stated Goal: return to independence OT Goal Formulation: With patient Time For Goal Achievement: 02/21/20 Potential to Achieve Goals: Good  Plan Discharge plan remains appropriate    Co-evaluation                 AM-PAC OT "6 Clicks" Daily Activity     Outcome Measure   Help from another person eating meals?: None Help from another person taking care of personal grooming?: None Help from another person toileting, which includes using toliet, bedpan, or urinal?: A Little Help from another person bathing (including washing, rinsing, drying)?: A Little Help from another person to put on and taking off regular upper body clothing?: None Help from another person to put on and taking off regular lower body clothing?: A Little 6 Click Score: 21    End of Session Equipment Utilized During Treatment: Gait belt;Rolling walker  OT Visit Diagnosis: Unsteadiness on feet (R26.81);Other abnormalities of gait and  mobility (R26.89);Muscle weakness (generalized) (M62.81)   Activity Tolerance Patient tolerated treatment well   Patient Left in bed;with call bell/phone within reach   Nurse Communication Mobility status        Time: 9983-3825 OT Time Calculation (min): 10 min  Charges: OT General Charges $OT Visit: 1 Visit OT Treatments $Self Care/Home Management : 8-22 mins  Zenovia Jarred, MSOT, OTR/L [facility] Office Number: [phone] Pager: [phone]  Zenovia Jarred 02/13/2020, 2:01 PM

## 2020-02-13 NOTE — Progress Notes (Signed)
Pt's primary MD is Burton Apley MD

## 2020-02-13 NOTE — Progress Notes (Signed)
SATURATION QUALIFICATIONS: (This note is used to comply with regulatory documentation for home oxygen)  Patient Saturations on Room Air at Rest =94%  Patient Saturations on Room Air while Ambulating = 93%  Patient Saturations on N/A Liters of oxygen while Ambulating = N/A  Please briefly explain why patient needs home oxygen: Pt did not require supplemental oxygen to maintain SpO2 >/88% with mobility.  Ina Homes, PT, DPT Acute Rehabilitation Services  Pager [phone] Office [phone]

## 2020-02-13 NOTE — Progress Notes (Signed)
Heart Failure Stewardship Pharmacist Progress Note   PCP: Burton Apley, MD PCP-Cardiologist: Tobias Alexander, MD    HPI:  74 yo M with PMH significant for paroxysmal afib (not on anticoagulation secondary to GI bleed), chronic systolic heart failure (previously refused ICD), chronic LBBB, and hypertensive heart disease. Admitted on 02/05/20 with afib with RVR.  ECHO from March 2016 with LVEF 25-30%.   LVEF now further reduced to 20-25% on ECHO from 02/06/20.  S/p DCCV on 5/14 but back in afib. Amiodarone was increased. Plan repeat DCCV in a few weeks.  Current HF Medications: Furosemide 40 mg BID KCl 20 mEq daily Carvedilol 3.125 mg BID  Prior to admission HF Medications: Furosemide 40 mg daily PRN swelling Carvedilol 25 mg BID - not taking Lisinopril 40 mg daily Spironolactone 25 mg daily - not taking  Pertinent Lab Values: . Serum creatinine 1.48, BUN 34, Potassium 3.9, Sodium 140, BNP 789.9, Magnesium 1.9  Vital Signs: . Weight: 250 lbs (estimated dry weight: 245 lbs) . Blood pressure: 90-110/80s . Heart rate: 90-110s   Medication Assistance / Insurance Benefits Check: Does the patient have prescription insurance? Yes Type of insurance plan: commercial insurance - BCBS  Does the patient qualify for medication assistance through manufacturers or grants? Yes   Eligible grants and/or patient assistance programs: pending HF regimen  Medication assistance applications in progress: None  Medication assistance applications approved: None  Approved medication assistance renewals will be completed by: Dr. Lindaann Slough office  Outpatient Pharmacy:  Prior to admission outpatient pharmacy: [facility] Is the patient willing to use [facility] T[facility] pharmacy at discharge? Yes - discharge pharmacy has been updated Is the patient willing to transition their outpatient pharmacy to utilize a [facility] outpatient pharmacy? No    Assessment: 1. Chronic systolic CHF (EF 70-62%).  NYHA class II/III symptoms. Still with some LE edema. - Continue furosemide 40 mg PO BID - Continue KCl 20 mEq daily - Continue carvedilol 3.125 mg BID - Holding lisinopril and spironolactone with low BP   Plan: 1) Medication changes recommended at this time: - None - BP soft with current regimen - Consider restarting low dose lisinopril and spironolactone at follow up appointment with Dr. Delton See   2) Patient assistance application(s): - None pending  3)  Education - Patient has been educated on current HF medications (BB, furosemide, KCl) as well as potential additions to HF regimen (ACE/ARB/ARNi, spironolactone, SGLT2i) -Patient verbalizes understanding that over the next few months, these medication doses may change and more medications may be added to optimize HF regimen -Patient has been educated on basic disease state pathophysiology and goals of therapy -Time spent (15 mins)   Danae Orleans, PharmD, BCPS Heart Failure Stewardship Pharmacist Phone [phone] 02/13/2020       9:56 AM

## 2020-02-13 NOTE — Progress Notes (Signed)
Called [facility] and made arrangements with to pick up Pt at 1510, pt discharged home.  Pt had oxygen, and discharge instructions given to Pt. Pt taken to front of hospital to meet [facility]

## 2020-02-13 NOTE — Progress Notes (Signed)
Physical Therapy Treatment Patient Details Name: Bryan Moran MRN: [id number] DOB: October 23, 1945 Today's Date: 02/13/2020    History of Present Illness 74 y.o. male admitted with afib with RVR 02/05/20.  Status post cardioversion, transesophageal echocardiogram 02/08/20.  PMH CHF. HTN, CKD, venous insufficency.    PT Comments    Pt able to complete bed mobility independent with assistance only for line management. Pt able to transfer supervision and amb RW increased distances with supervision and assistance for line management. Trialed amb without oxygen during session, pt able to maintain appropriate sats (see below). Will continue to follow acutely until d/c to next level care.   Spo2 RA at rest 94% Spo2 during amb 93-97% RA Spo2 RA at rest after amb >88% RA   Follow Up Recommendations  Home health PT;Supervision - Intermittent     Equipment Recommendations  None recommended by PT;Other (comment)    Recommendations for Other Services       Precautions / Restrictions Precautions Precautions: Fall Precaution Comments: monitor HR Restrictions Weight Bearing Restrictions: No    Mobility  Bed Mobility Overal bed mobility: Independent Bed Mobility: Sit to Supine       Sit to supine: Independent   General bed mobility comments: Assistance only for line management  Transfers   Equipment used: Rolling walker (2 wheeled) Transfers: Sit to/from Stand Sit to Stand: Supervision;From elevated surface         General transfer comment: Assist for lines/safety. Incr time and effort  Ambulation/Gait Ambulation/Gait assistance: Supervision Gait Distance (Feet): 492 Feet Assistive device: Rolling walker (2 wheeled) Gait Pattern/deviations: Step-through pattern;Decreased stride length;Wide base of support Gait velocity: decreased   General Gait Details: supervision for lines/safety/monitoring vital signs.   Stairs             Wheelchair Mobility    Modified Rankin  (Stroke Patients Only)       Balance Overall balance assessment: Needs assistance Sitting-balance support: No upper extremity supported;Feet supported Sitting balance-Leahy Scale: Good     Standing balance support: During functional activity;Bilateral upper extremity supported Standing balance-Leahy Scale: Fair                              Cognition Arousal/Alertness: Awake/alert Behavior During Therapy: WFL for tasks assessed/performed Overall Cognitive Status: Within Functional Limits for tasks assessed                                        Exercises      General Comments General comments (skin integrity, edema, etc.): Pt HR at rest variable with afib reported on monitor, oxygen at rest RA variable with HR changes. Trialed amb without oxygen, pt between 93%-97% RA. HR up to 126 with amb, monitor reading afib.      Pertinent Vitals/Pain      Home Living                      Prior Function            PT Goals (current goals can now be found in the care plan section) Acute Rehab PT Goals Patient Stated Goal: return to independence PT Goal Formulation: With patient Time For Goal Achievement: 02/21/20 Potential to Achieve Goals: Good Progress towards PT goals: Progressing toward goals    Frequency    Min 3X/week  PT Plan Current plan remains appropriate    Co-evaluation              AM-PAC PT "6 Clicks" Mobility   Outcome Measure  Help needed turning from your back to your side while in a flat bed without using bedrails?: None Help needed moving from lying on your back to sitting on the side of a flat bed without using bedrails?: None Help needed moving to and from a bed to a chair (including a wheelchair)?: None Help needed standing up from a chair using your arms (e.g., wheelchair or bedside chair)?: None Help needed to walk in hospital room?: None Help needed climbing 3-5 steps with a railing? : A  Little 6 Click Score: 23    End of Session Equipment Utilized During Treatment: Gait belt Activity Tolerance: Patient tolerated treatment well Patient left: in bed;with call bell/phone within reach;with bed alarm set;with nursing/sitter in room Nurse Communication: Mobility status PT Visit Diagnosis: Unsteadiness on feet (R26.81);Muscle weakness (generalized) (M62.81)     Time: 8657-8469 PT Time Calculation (min) (ACUTE ONLY): 27 min  Charges:  $Therapeutic Exercise: 8-22 mins $Therapeutic Activity: 8-22 mins                     [employer] SPT 02/13/2020    Rolland Porter 02/13/2020, 9:57 AM

## 2020-02-13 NOTE — Progress Notes (Signed)
Pt noted to desat to the 70's with eyes closed, he then becomes anxious and searches for his oxygen tubing and becomes increase shortness of breath.  Pt placed back on oxygen 2 L Nasal cannula, returns to 92-96%

## 2020-02-13 NOTE — Plan of Care (Signed)

## 2020-02-13 NOTE — Plan of Care (Signed)
Problem: Education: Goal: Knowledge of General Education information will improve Description: Including pain rating scale, medication(s)/side effects and non-pharmacologic comfort measures 02/13/2020 1445 by Shanon Ace, RN Outcome: Adequate for Discharge 02/13/2020 1235 by Shanon Ace, RN Outcome: Progressing   Problem: Health Behavior/Discharge Planning: Goal: Ability to manage health-related needs will improve 02/13/2020 1445 by Shanon Ace, RN Outcome: Adequate for Discharge 02/13/2020 1235 by Shanon Ace, RN Outcome: Progressing   Problem: Clinical Measurements: Goal: Ability to maintain clinical measurements within normal limits will improve 02/13/2020 1445 by Shanon Ace, RN Outcome: Adequate for Discharge 02/13/2020 1235 by Shanon Ace, RN Outcome: Progressing Goal: Will remain free from infection 02/13/2020 1445 by Shanon Ace, RN Outcome: Adequate for Discharge 02/13/2020 1235 by Shanon Ace, RN Outcome: Progressing Goal: Diagnostic test results will improve 02/13/2020 1445 by Shanon Ace, RN Outcome: Adequate for Discharge 02/13/2020 1235 by Shanon Ace, RN Outcome: Progressing Goal: Respiratory complications will improve 02/13/2020 1445 by Shanon Ace, RN Outcome: Adequate for Discharge 02/13/2020 1235 by Shanon Ace, RN Outcome: Progressing Goal: Cardiovascular complication will be avoided 02/13/2020 1445 by Shanon Ace, RN Outcome: Adequate for Discharge 02/13/2020 1235 by Shanon Ace, RN Outcome: Progressing   Problem: Activity: Goal: Risk for activity intolerance will decrease 02/13/2020 1445 by Shanon Ace, RN Outcome: Adequate for Discharge 02/13/2020 1235 by Shanon Ace, RN Outcome: Progressing   Problem: Nutrition: Goal: Adequate nutrition will be maintained 02/13/2020 1445 by Shanon Ace, RN Outcome: Adequate for Discharge 02/13/2020 1235 by Shanon Ace, RN Outcome: Progressing   Problem: Coping: Goal: Level of anxiety  will decrease 02/13/2020 1445 by Shanon Ace, RN Outcome: Adequate for Discharge 02/13/2020 1235 by Shanon Ace, RN Outcome: Progressing   Problem: Elimination: Goal: Will not experience complications related to bowel motility 02/13/2020 1445 by Shanon Ace, RN Outcome: Adequate for Discharge 02/13/2020 1235 by Shanon Ace, RN Outcome: Progressing Goal: Will not experience complications related to urinary retention 02/13/2020 1445 by Shanon Ace, RN Outcome: Adequate for Discharge 02/13/2020 1235 by Shanon Ace, RN Outcome: Progressing   Problem: Pain Managment: Goal: General experience of comfort will improve 02/13/2020 1445 by Shanon Ace, RN Outcome: Adequate for Discharge 02/13/2020 1235 by Shanon Ace, RN Outcome: Progressing   Problem: Safety: Goal: Ability to remain free from injury will improve 02/13/2020 1445 by Shanon Ace, RN Outcome: Adequate for Discharge 02/13/2020 1235 by Shanon Ace, RN Outcome: Progressing   Problem: Skin Integrity: Goal: Risk for impaired skin integrity will decrease 02/13/2020 1445 by Shanon Ace, RN Outcome: Adequate for Discharge 02/13/2020 1235 by Shanon Ace, RN Outcome: Progressing   Problem: Education: Goal: Knowledge of disease or condition will improve 02/13/2020 1445 by Shanon Ace, RN Outcome: Adequate for Discharge 02/13/2020 1235 by Shanon Ace, RN Outcome: Progressing Goal: Understanding of medication regimen will improve 02/13/2020 1445 by Shanon Ace, RN Outcome: Adequate for Discharge 02/13/2020 1235 by Shanon Ace, RN Outcome: Progressing Goal: Individualized Educational Video(s) 02/13/2020 1445 by Shanon Ace, RN Outcome: Adequate for Discharge 02/13/2020 1235 by Shanon Ace, RN Outcome: Progressing   Problem: Activity: Goal: Ability to tolerate increased activity will improve 02/13/2020 1445 by Shanon Ace, RN Outcome: Adequate for Discharge 02/13/2020 1235 by Shanon Ace,  RN Outcome: Progressing   Problem: Cardiac: Goal: Ability to achieve and maintain adequate cardiopulmonary perfusion will improve 02/13/2020 1445 by Shanon Ace, RN Outcome: Adequate for Discharge 02/13/2020 1235 by Shanon Ace, RN Outcome: Progressing   Problem: Health Behavior/Discharge Planning: Goal: Ability to safely manage health-related needs after discharge will improve 02/13/2020 1445 by Shanon Ace, RN  Outcome: Adequate for Discharge 02/13/2020 1235 by Luna Kitchens, RN Outcome: Progressing

## 2020-02-13 NOTE — Discharge Instructions (Signed)
 Atrial Fibrillation  Atrial fibrillation is a type of irregular or rapid heartbeat (arrhythmia). In atrial fibrillation, the top part of the heart (atria) beats in an irregular pattern. This makes the heart unable to pump blood normally and effectively. The goal of treatment is to prevent blood clots from forming, control your heart rate, or restore your heartbeat to a normal rhythm. If this condition is not treated, it can cause serious problems, such as a weakened heart muscle (cardiomyopathy) or a stroke. What are the causes? This condition is often caused by medical conditions that damage the heart's electrical system. These include:  High blood pressure (hypertension). This is the most common cause.  Certain heart problems or conditions, such as heart failure, coronary artery disease, heart valve problems, or heart surgery.  Diabetes.  Overactive thyroid (hyperthyroidism).  Obesity.  Chronic kidney disease. In some cases, the cause of this condition is not known. What increases the risk? This condition is more likely to develop in:  Older people.  People who smoke.  Athletes who do endurance exercise.  People who have a family history of atrial fibrillation.  Men.  People who use drugs.  People who drink a lot of alcohol.  People who have lung conditions, such as emphysema, pneumonia, or COPD.  People who have obstructive sleep apnea. What are the signs or symptoms? Symptoms of this condition include:  A feeling that your heart is racing or beating irregularly.  Discomfort or pain in your chest.  Shortness of breath.  Sudden light-headedness or weakness.  Tiring easily during exercise or activity.  Fatigue.  Syncope (fainting).  Sweating. In some cases, there are no symptoms. How is this diagnosed? Your health care provider may detect atrial fibrillation when taking your pulse. If detected, this condition may be diagnosed with:  An  electrocardiogram (ECG) to check electrical signals of the heart.  An ambulatory cardiac monitor to record your heart's activity for a few days.  A transthoracic echocardiogram (TTE) to create pictures of your heart.  A transesophageal echocardiogram (TEE) to create even closer pictures of your heart.  A stress test to check your blood supply while you exercise.  Imaging tests, such as a CT scan or chest X-ray.  Blood tests. How is this treated? Treatment depends on underlying conditions and how you feel when you experience atrial fibrillation. This condition may be treated with:  Medicines to prevent blood clots or to treat heart rate or heart rhythm problems.  Electrical cardioversion to reset the heart's rhythm.  A pacemaker to correct abnormal heart rhythm.  Ablation to remove the heart tissue that sends abnormal signals.  Left atrial appendage closure to seal the area where blood clots can form. In some cases, underlying conditions will be treated. Follow these instructions at home: Medicines  Take over-the counter and prescription medicines only as told by your health care provider.  Do not take any new medicines without talking to your health care provider.  If you are taking blood thinners: ? Talk with your health care provider before you take any medicines that contain aspirin or NSAIDs, such as ibuprofen. These medicines increase your risk for dangerous bleeding. ? Take your medicine exactly as told, at the same time every day. ? Avoid activities that could cause injury or bruising, and follow instructions about how to prevent falls. ? Wear a medical alert bracelet or carry a card that lists what medicines you take. Lifestyle      Do not use any   products that contain nicotine or tobacco, such as cigarettes, e-cigarettes, and chewing tobacco. If you need help quitting, ask your health care provider.  Eat heart-healthy foods. Talk with a dietitian to make an  eating plan that is right for you.  Exercise regularly as told by your health care provider.  Do not drink alcohol.  Lose weight if you are overweight.  Do not use drugs, including cannabis. General instructions  If you have obstructive sleep apnea, manage your condition as told by your health care provider.  Do not use diet pills unless your health care provider approves. Diet pills can make heart problems worse.  Keep all follow-up visits as told by your health care provider. This is important. Contact a health care provider if you:  Notice a change in the rate, rhythm, or strength of your heartbeat.  Are taking a blood thinner and you notice more bruising.  Tire more easily when you exercise or do heavy work.  Have a sudden change in weight. Get help right away if you have:   Chest pain, abdominal pain, sweating, or weakness.  Trouble breathing.  Side effects of blood thinners, such as blood in your vomit, stool, or urine, or bleeding that cannot stop.  Any symptoms of a stroke. "BE FAST" is an easy way to remember the main warning signs of a stroke: ? B - Balance. Signs are dizziness, sudden trouble walking, or loss of balance. ? E - Eyes. Signs are trouble seeing or a sudden change in vision. ? F - Face. Signs are sudden weakness or numbness of the face, or the face or eyelid drooping on one side. ? A - Arms. Signs are weakness or numbness in an arm. This happens suddenly and usually on one side of the body. ? S - Speech. Signs are sudden trouble speaking, slurred speech, or trouble understanding what people say. ? T - Time. Time to call emergency services. Write down what time symptoms started.  Other signs of a stroke, such as: ? A sudden, severe headache with no known cause. ? Nausea or vomiting. ? Seizure. These symptoms may represent a serious problem that is an emergency. Do not wait to see if the symptoms will go away. Get medical help right away. Call your  local emergency services (911 in the [place] Do not drive yourself to the hospital. Summary  Atrial fibrillation is a type of irregular or rapid heartbeat (arrhythmia).  Symptoms include a feeling that your heart is beating fast or irregularly.  You may be given medicines to prevent blood clots or to treat heart rate or heart rhythm problems.  Get help right away if you have signs or symptoms of a stroke.  Get help right away if you cannot catch your breath or have chest pain or pressure. This information is not intended to replace advice given to you by your health care provider. Make sure you discuss any questions you have with your health care provider. Document Revised: 03/07/2019 Document Reviewed: 03/07/2019 Elsevier Patient Education  2020 Else[employer] Inc.   Acute Kidney Injury, Adult  Acute kidney injury is a sudden worsening of kidney function. The kidneys are organs that have several jobs. They filter the blood to remove waste products and extra fluid. They also maintain a healthy balance of minerals and hormones in the body, which helps control blood pressure and keep bones strong. With this condition, your kidneys do not do their jobs as well as they should. This condition ranges from  mild to severe. Over time it may develop into long-lasting (chronic) kidney disease. Early detection and treatment may prevent acute kidney injury from developing into a chronic condition. What are the causes? Common causes of this condition include:  A problem with blood flow to the kidneys. This may be caused by: ? Low blood pressure (hypotension) or shock. ? Blood loss. ? Heart and blood vessel (cardiovascular) disease. ? Severe burns. ? Liver disease.  Direct damage to the kidneys. This may be caused by: ? Certain medicines. ? A kidney infection. ? Poisoning. ? Being around or in contact with toxic substances. ? A surgical wound. ? A hard, direct hit to the kidney area.  A sudden  blockage of urine flow. This may be caused by: ? Cancer. ? Kidney stones. ? An enlarged prostate in males. What are the signs or symptoms? Symptoms of this condition may not be obvious until the condition becomes severe. Symptoms of this condition can include:  Tiredness (lethargy), or difficulty staying awake.  Nausea or vomiting.  Swelling (edema) of the face, legs, ankles, or feet.  Problems with urination, such as: ? Abdominal pain, or pain along the side of your stomach (flank). ? Decreased urine production. ? Decrease in the force of urine flow.  Muscle twitches and cramps, especially in the legs.  Confusion or trouble concentrating.  Loss of appetite.  Fever. How is this diagnosed? This condition may be diagnosed with tests, including:  Blood tests.  Urine tests.  Imaging tests.  A test in which a sample of tissue is removed from the kidneys to be examined under a microscope (kidney biopsy). How is this treated? Treatment for this condition depends on the cause and how severe the condition is. In mild cases, treatment may not be needed. The kidneys may heal on their own. In more severe cases, treatment will involve:  Treating the cause of the kidney injury. This may involve changing any medicines you are taking or adjusting your dosage.  Fluids. You may need specialized IV fluids to balance your body's needs.  Having a catheter placed to drain urine and prevent blockages.  Preventing problems from occurring. This may mean avoiding certain medicines or procedures that can cause further injury to the kidneys. In some cases treatment may also require:  A procedure to remove toxic wastes from the body (dialysis or continuous renal replacement therapy - CRRT).  Surgery. This may be done to repair a torn kidney, or to remove the blockage from the urinary system. Follow these instructions at home: Medicines  Take over-the-counter and prescription medicines only as  told by your health care provider.  Do not take any new medicines without your health care provider's approval. Many medicines can worsen your kidney damage.  Do not take any vitamin and mineral supplements without your health care provider's approval. Many nutritional supplements can worsen your kidney damage. Lifestyle  If your health care provider prescribed changes to your diet, follow them. You may need to decrease the amount of protein you eat.  Achieve and maintain a healthy weight. If you need help with this, ask your health care provider.  Start or continue an exercise plan. Try to exercise at least 30 minutes a day, 5 days a week.  Do not use any tobacco products, such as cigarettes, chewing tobacco, and e-cigarettes. If you need help quitting, ask your health care provider. General instructions  Keep track of your blood pressure. Report changes in your blood pressure as told by  your health care provider.  Stay up to date with immunizations. Ask your health care provider which immunizations you need.  Keep all follow-up visits as told by your health care provider. This is important. Where to find more information  American Association of Kidney Patients: ResidentialShow.is  [employer]: www.kidney.org  [employer]: FightingMatch.com.ee  [employer] [facility]: ? www.lifeoptions.org ? www.kidneyschool.org Contact a health care provider if:  Your symptoms get worse.  You develop new symptoms. Get help right away if:  You develop symptoms of worsening kidney disease, which include: ? Headaches. ? Abnormally dark or light skin. ? Easy bruising. ? Frequent hiccups. ? Chest pain. ? Shortness of breath. ? End of menstruation in women. ? Seizures. ? Confusion or altered mental status. ? Abdominal or back pain. ? Itchiness.  You have a fever.  Your body is producing less urine.  You have pain or bleeding when you  urinate. Summary  Acute kidney injury is a sudden worsening of kidney function.  Acute kidney injury can be caused by problems with blood flow to the kidneys, direct damage to the kidneys, and sudden blockage of urine flow.  Symptoms of this condition may not be obvious until it becomes severe. Symptoms may include edema, lethargy, confusion, nausea or vomiting, and problems passing urine.  This condition can usually be diagnosed with blood tests, urine tests, and imaging tests. Sometimes a kidney biopsy is done to diagnose this condition.  Treatment for this condition often involves treating the underlying cause. It is treated with fluids, medicines, dialysis, diet changes, or surgery. This information is not intended to replace advice given to you by your health care provider. Make sure you discuss any questions you have with your health care provider. Document Revised: 08/26/2017 Document Reviewed: 09/03/2016 Elsevier Patient Education  2020 Elsevier Inc.  Heart Failure, Self Care Heart failure is a serious condition. This sheet explains things you need to do to take care of yourself at home. To help you stay as healthy as possible, you may be asked to change your diet, take certain medicines, and make other changes in your life. Your doctor may also give you more specific instructions. If you have problems or questions, call your doctor. What are the risks? Having heart failure makes it more likely for you to have some problems. These problems can get worse if you do not take good care of yourself. Problems may include:  Blood clotting problems. This may cause a stroke.  Damage to the kidneys, liver, or lungs.  Abnormal heart rhythms. Supplies needed:  Scale for weighing yourself.  Blood pressure monitor.  Notebook.  Medicines. How to care for yourself when you have heart failure Medicines Take over-the-counter and prescription medicines only as told by your doctor. Take  your medicines every day.  Do not stop taking your medicine unless your doctor tells you to do so.  Do not skip any medicines.  Get your prescriptions refilled before you run out of medicine. This is important. Eating and drinking   Eat heart-healthy foods. Talk with a diet specialist (dietitian) to create an eating plan.  Choose foods that: ? Have no trans fat. ? Are low in saturated fat and cholesterol.  Choose healthy foods, such as: ? Fresh or frozen fruits and vegetables. ? Fish. ? Low-fat (lean) meats. ? Legumes, such as beans, peas, and lentils. ? Fat-free or low-fat dairy products. ? Whole-grain foods. ? High-fiber foods.  Limit salt (sodium) if told by your  doctor. Ask your diet specialist to tell you which seasonings are healthy for your heart.  Cook in healthy ways instead of frying. Healthy ways of cooking include roasting, grilling, broiling, baking, poaching, steaming, and stir-frying.  Limit how much fluid you drink, if told by your doctor. Alcohol use  Do not drink alcohol if: ? Your doctor tells you not to drink. ? Your heart was damaged by alcohol, or you have very bad heart failure. ? You are pregnant, may be pregnant, or are planning to become pregnant.  If you drink alcohol: ? Limit how much you use to:  0-1 drink a day for women.  0-2 drinks a day for men. ? Be aware of how much alcohol is in your drink. In the [place] one drink equals one 12 oz bottle of beer (355 mL), one 5 oz glass of wine (148 mL), or one 1 oz glass of hard liquor (44 mL). Lifestyle   Do not use any products that contain nicotine or tobacco, such as cigarettes, e-cigarettes, and chewing tobacco. If you need help quitting, ask your doctor. ? Do not use nicotine gum or patches before talking to your doctor.  Do not use illegal drugs.  Lose weight if told by your doctor.  Do physical activity if told by your doctor. Talk to your doctor before you begin an exercise if: ? You  are an older adult. ? You have very bad heart failure.  Learn to manage stress. If you need help, ask your doctor.  Get rehab (rehabilitation) to help you stay independent and to help with your quality of life.  Plan time to rest when you get tired. Check weight and blood pressure   Weigh yourself every day. This will help you to know if fluid is building up in your body. ? Weigh yourself every morning after you pee (urinate) and before you eat breakfast. ? Wear the same amount of clothing each time. ? Write down your daily weight. Give your record to your doctor.  Check and write down your blood pressure as told by your doctor.  Check your pulse as told by your doctor. Dealing with very hot and very cold weather  If it is very hot: ? Avoid activities that take a lot of energy. ? Use air conditioning or fans, or find a cooler place. ? Avoid caffeine and alcohol. ? Wear clothing that is loose-fitting, lightweight, and light-colored.  If it is very cold: ? Avoid activities that take a lot of energy. ? Layer your clothes. ? Wear mittens or gloves, a hat, and a scarf when you go outside. ? Avoid alcohol. Follow these instructions at home:  Stay up to date with shots (vaccines). Get pneumococcal and flu (influenza) shots.  Keep all follow-up visits as told by your doctor. This is important. Contact a doctor if:  You gain weight quickly.  You have increasing shortness of breath.  You cannot do your normal activities.  You get tired easily.  You cough a lot.  You don't feel like eating or feel like you may vomit (nauseous).  You become puffy (swell) in your hands, feet, ankles, or belly (abdomen).  You cannot sleep well because it is hard to breathe.  You feel like your heart is beating fast (palpitations).  You get dizzy when you stand up. Get help right away if:  You have trouble breathing.  You or someone else notices a change in your behavior, such as having  trouble staying awake.  You have chest pain or discomfort.  You pass out (faint). These symptoms may be an emergency. Do not wait to see if the symptoms will go away. Get medical help right away. Call your local emergency services (911 in the [place] Do not drive yourself to the hospital. Summary  Heart failure is a serious condition. To care for yourself, you may have to change your diet, take medicines, and make other lifestyle changes.  Take your medicines every day. Do not stop taking them unless your doctor tells you to do so.  Eat heart-healthy foods, such as fresh or frozen fruits and vegetables, fish, lean meats, legumes, fat-free or low-fat dairy products, and whole-grain or high-fiber foods.  Ask your doctor if you can drink alcohol. You may have to stop alcohol use if you have very bad heart failure.  Contact your doctor if you gain weight quickly or feel that your heart is beating too fast. Get help right away if you pass out, or have chest pain or trouble breathing. This information is not intended to replace advice given to you by your health care provider. Make sure you discuss any questions you have with your health care provider. Document Revised: 12/26/2018 Document Reviewed: 12/27/2018 Elsevier Patient Education  2020 Elsevier Inc.  Heart Failure, Diagnosis  Heart failure means that your heart is not able to pump blood in the right way. This makes it hard for your body to work well. Heart failure is usually a long-term (chronic) condition. You must take good care of yourself and follow your treatment plan from your doctor. What are the causes? This condition may be caused by:  High blood pressure.  Build up of cholesterol and fat in the arteries.  Heart attack. This injures the heart muscle.  Heart valves that do not open and close properly.  Damage of the heart muscle. This is also called cardiomyopathy.  Lung disease.  Abnormal heart rhythms. What  increases the risk? The risk of heart failure goes up as a person ages. This condition is also more likely to develop in people who:  Are overweight.  Are male.  Smoke or chew tobacco.  Abuse alcohol or illegal drugs.  Have taken medicines that can damage the heart.  Have diabetes.  Have abnormal heart rhythms.  Have thyroid problems.  Have low blood counts (anemia). What are the signs or symptoms? Symptoms of this condition include:  Shortness of breath.  Coughing.  Swelling of the feet, ankles, legs, or belly.  Losing weight for no reason.  Trouble breathing.  Waking from sleep because of the need to sit up and get more air.  Rapid heartbeat.  Being very tired.  Feeling dizzy, or feeling like you may pass out (faint).  Having no desire to eat.  Feeling like you may vomit (nauseous).  Peeing (urinating) more at night.  Feeling confused. How is this treated?     This condition may be treated with:  Medicines. These can be given to treat blood pressure and to make the heart muscles stronger.  Changes in your daily life. These may include eating a healthy diet, staying at a healthy body weight, quitting tobacco and illegal drug use, or doing exercises.  Surgery. Surgery can be done to open blocked valves, or to put devices in the heart, such as pacemakers.  A donor heart (heart transplant). You will receive a healthy heart from a donor. Follow these instructions at home:  Treat other conditions as told by your  doctor. These may include high blood pressure, diabetes, thyroid disease, or abnormal heart rhythms.  Learn as much as you can about heart failure.  Get support as you need it.  Keep all follow-up visits as told by your doctor. This is important. Summary  Heart failure means that your heart is not able to pump blood in the right way.  This condition is caused by high blood pressure, heart attack, or damage of the heart muscle.  Symptoms  of this condition include shortness of breath and swelling of the feet, ankles, legs, or belly. You may also feel very tired or feel like you may vomit.  You may be treated with medicines, surgery, or changes in your daily life.  Treat other health conditions as told by your doctor. This information is not intended to replace advice given to you by your health care provider. Make sure you discuss any questions you have with your health care provider. Document Revised: 12/01/2018 Document Reviewed: 12/01/2018 Elsevier Patient Education  2020 [employer].   Information on my medicine - ELIQUIS (apixaban)  This medication education was reviewed with me or my healthcare representative as part of my discharge preparation.    Why was Eliquis prescribed for you? Eliquis was prescribed for you to reduce the risk of a blood clot forming that can cause a stroke if you have a medical condition called atrial fibrillation (a type of irregular heartbeat).  What do You need to know about Eliquis ? Take your Eliquis TWICE DAILY - one tablet in the morning and one tablet in the evening with or without food. If you have difficulty swallowing the tablet whole please discuss with your pharmacist how to take the medication safely.  Take Eliquis exactly as prescribed by your doctor and DO NOT stop taking Eliquis without talking to the doctor who prescribed the medication.  Stopping may increase your risk of developing a stroke.  Refill your prescription before you run out.  After discharge, you should have regular check-up appointments with your healthcare provider that is prescribing your Eliquis.  In the future your dose may need to be changed if your kidney function or weight changes by a significant amount or as you get older.  What do you do if you miss a dose? If you miss a dose, take it as soon as you remember on the same day and resume taking twice daily.  Do not take more than one dose of  ELIQUIS at the same time to make up a missed dose.  Important Safety Information A possible side effect of Eliquis is bleeding. You should call your healthcare provider right away if you experience any of the following: ? Bleeding from an injury or your nose that does not stop. ? Unusual colored urine ([place] or dark brown) or unusual colored stools ([place] or black). ? Unusual bruising for unknown reasons. ? A serious fall or if you hit your head (even if there is no bleeding).  Some medicines may interact with Eliquis and might increase your risk of bleeding or clotting while on Eliquis. To help avoid this, consult your healthcare provider or pharmacist prior to using any new prescription or non-prescription medications, including herbals, vitamins, non-steroidal anti-inflammatory drugs (NSAIDs) and supplements.  This website has more information on Eliquis (apixaban): http://www.eliquis.com/eliquis/home       Heart Failure Action Plan A heart failure action plan helps you understand what to do when you have symptoms of heart failure. Follow the plan that was  created by you and your health care provider. Review your plan each time you visit your health care provider. [place] zone These signs and symptoms mean you should get medical help right away:  You have trouble breathing when resting.  You have a dry cough that is getting worse.  You have swelling or pain in your legs or abdomen that is getting worse.  You suddenly gain more than 2-3 lb (0.9-1.4 kg) in a day, or more than 5 lb (2.3 kg) in one week. This amount may be more or less depending on your condition.  You have trouble staying awake or you feel confused.  You have chest pain.  You do not have an appetite.  You pass out. If you experience any of these symptoms:  Call your local emergency services (911 in the [place] right away or seek help at the emergency department of the nearest hospital. Yellow zone These signs and  symptoms mean your condition may be getting worse and you should make some changes:  You have trouble breathing when you are active or you need to sleep with extra pillows.  You have swelling in your legs or abdomen.  You gain 2-3 lb (0.9-1.4 kg) in one day, or 5 lb (2.3 kg) in one week. This amount may be more or less depending on your condition.  You get tired easily.  You have trouble sleeping.  You have a dry cough. If you experience any of these symptoms:  Contact your health care provider within the next day.  Your health care provider may adjust your medicines. Green zone These signs mean you are doing well and can continue what you are doing:  You do not have shortness of breath.  You have very little swelling or no new swelling.  Your weight is stable (no gain or loss).  You have a normal activity level.  You do not have chest pain or any other new symptoms. Follow these instructions at home:  Take over-the-counter and prescription medicines only as told by your health care provider.  Weigh yourself daily. Your target weight is __________ lb (__________ kg). ? Call your health care provider if you gain more than __________ lb (__________ kg) in a day, or more than __________ lb (__________ kg) in one week.  Eat a heart-healthy diet. Work with a diet and nutrition specialist (dietitian) to create an eating plan that is best for you.  Keep all follow-up visits as told by your health care provider. This is important. Where to find more information  American Heart Association: www.heart.org Summary  Follow the action plan that was created by you and your health care provider.  Get help right away if you have any symptoms in the [place]. This information is not intended to replace advice given to you by your health care provider. Make sure you discuss any questions you have with your health care provider. Document Revised: 08/26/2017 Document Reviewed:  10/23/2016 Elsevier Patient Education  2020 [employer].

## 2020-02-13 NOTE — TOC Transition Note (Signed)
Transition of Care [facility]) - CM/SW Discharge Note   Patient Details  Name: Bryan Moran MRN: [id number] Date of Birth: [birth year]  [facility] CM/SW Contact:  Leone Haven, RN Phone Number: 02/13/2020, 11:51 AM   Clinical Narrative:    Patient is for dc today, he is set up with [facility] for HHPT and HHAIDE.  Soc will begin 24 to 48 hrs post dc. Patient will need home oxygen.  NCM made referral to [facility] with [employer] for the home oxygen.  Patient has a copay of 34.00 he states he will use his credit card.  He also will need transportation home.     Final next level of care: Home w Home Health Services Barriers to Discharge: No Barriers Identified   Patient Goals and CMS Choice Patient states their goals for this hospitalization and ongoing recovery are:: get better CMS Medicare.gov Compare Post Acute Care list provided to:: Patient Choice offered to / list presented to : Patient  Discharge Placement                       Discharge Plan and Services   Discharge Planning Services: CM Consult Post Acute Care Choice: Home Health          DME Arranged: Oxygen DME Agency: [employer] Date DME Agency Contacted: 02/12/20 Time DME Agency Contacted: 1300 Representative spoke with at DME Agency: zach HH Arranged: PT, Nurse's Aide HH Agency: [facility] Date [facility] Agency Contacted: 02/12/20 Time HH Agency Contacted: 1300 Representative spoke with at [facility] Agency: cory  Social Determinants of Health (SDOH) Interventions     Readmission Risk Interventions No flowsheet data found.

## 2020-02-13 NOTE — Plan of Care (Signed)
°  Problem: Education: °Goal: Knowledge of General Education information will improve °Description: Including pain rating scale, medication(s)/side effects and non-pharmacologic comfort measures °Outcome: Progressing °  °Problem: Health Behavior/Discharge Planning: °Goal: Ability to manage health-related needs will improve °Outcome: Progressing °  °Problem: Clinical Measurements: °Goal: Ability to maintain clinical measurements within normal limits will improve °Outcome: Progressing °Goal: Will remain free from infection °Outcome: Progressing °Goal: Cardiovascular complication will be avoided °Outcome: Progressing °  °Problem: Activity: °Goal: Risk for activity intolerance will decrease °Outcome: Progressing °  °Problem: Nutrition: °Goal: Adequate nutrition will be maintained °Outcome: Progressing °  °

## 2020-02-13 NOTE — Progress Notes (Signed)
Progress Note  Patient Name: Bryan Moran Date of Encounter: 02/13/2020  Primary Cardiologist: Ena Dawley, MD   Subjective   At times feeling some SOB. Asking for breathing apparatus.   Inpatient Medications    Scheduled Meds: . amiodarone  400 mg Oral BID  . apixaban  5 mg Oral BID  . carvedilol  3.125 mg Oral BID WC  . clotrimazole-betamethasone  1 application Topical Daily  . furosemide  40 mg Oral BID  . pantoprazole  40 mg Oral Daily  . potassium chloride  20 mEq Oral Daily   Continuous Infusions:  PRN Meds: ipratropium-albuterol, ondansetron **OR** ondansetron (ZOFRAN) IV, polyethylene glycol, senna-docusate, white petrolatum   Vital Signs    Vitals:   02/13/20 0100 02/13/20 0315 02/13/20 0329 02/13/20 0700  BP:  96/83  112/82  Pulse: 87 (!) 110  (!) 107  Resp:  20  (!) 24  Temp:  97.7 F (36.5 C)    TempSrc:  Oral    SpO2:  92%  93%  Weight:   113.4 kg   Height:        Intake/Output Summary (Last 24 hours) at 02/13/2020 1001 Last data filed at 02/13/2020 0329 Gross per 24 hour  Intake 720 ml  Output 400 ml  Net 320 ml   Last 3 Weights 02/13/2020 02/12/2020 02/11/2020  Weight (lbs) 250 lb 246 lb 11.1 oz 247 lb 9.2 oz  Weight (kg) 113.4 kg 111.9 kg 112.3 kg      Telemetry    AFIB IVCD 90's - Personally Reviewed  ECG    No new - Personally Reviewed  Physical Exam   GEN: No acute distress.   Neck: No JVD Cardiac: RRR, no murmurs, rubs, or gallops.  Respiratory: Clear to auscultation bilaterally. (minimal wheeze) GI: Soft, nontender, non-distended  MS: 1+ edema; No deformity. Neuro:  Nonfocal  Psych: Normal affect   Labs    High Sensitivity Troponin:   Recent Labs  Lab 02/05/20 1800 02/05/20 2013 02/05/20 2304 02/06/20 0510  TROPONINIHS 75* 81* 83* 75*      Chemistry Recent Labs  Lab 02/07/20 0217 02/07/20 0217 02/08/20 0243 02/08/20 0243 02/09/20 0205 02/09/20 0205 02/10/20 0153 02/11/20 0206 02/12/20 0216  NA 140   <  > 139   < > 140   < > 140 141 140  K 4.2   < > 3.5   < > 3.3*   < > 4.0 3.8 3.9  CL 112*   < > 109   < > 107   < > 108 104 107  CO2 18*   < > 22   < > 24   < > 23 25 24   GLUCOSE 122*   < > 112*   < > 119*   < > 103* 106* 125*  BUN 36*   < > 37*   < > 39*   < > 33* 33* 34*  CREATININE 1.90*   < > 1.81*   < > 1.67*   < > 1.47* 1.60* 1.48*  CALCIUM 8.1*   < > 8.1*   < > 7.7*   < > 7.8* 8.0* 8.2*  PROT 4.9*  --  5.2*  --  5.0*  --   --   --   --   ALBUMIN 2.6*  --  2.7*  --  2.5*  --   --   --   --   AST 19  --  14*  --  16  --   --   --   --  ALT 29  --  30  --  27  --   --   --   --   ALKPHOS 50  --  52  --  49  --   --   --   --   BILITOT 1.5*  --  1.2  --  1.5*  --   --   --   --   GFRNONAA 34*   < > 37*   < > 40*   < > 47* 42* 47*  GFRAA 40*   < > 42*   < > 47*   < > 54* 49* 54*  ANIONGAP 10   < > 8   < > 9   < > 9 12 9    < > = values in this interval not displayed.     Hematology Recent Labs  Lab 02/09/20 0205 02/10/20 0153 02/11/20 0206  WBC 9.2 6.9 8.6  RBC 4.78 4.73 4.77  HGB 15.0 15.0 15.1  HCT 46.5 46.4 46.8  MCV 97.3 98.1 98.1  MCH 31.4 31.7 31.7  MCHC 32.3 32.3 32.3  RDW 15.1 15.2 15.2  PLT 129* 129* 131*    BNPNo results for input(s): BNP, PROBNP in the last 168 hours.   DDimer No results for input(s): DDIMER in the last 168 hours.   Radiology    No results found.   Patient Profile     74 y.o. male with persistent atrial fibrillation, failed cardioversion, back on amiodarone with heart failure systolic EF 25%.  Assessment & Plan    Persistent atrial fibrillation -Cardioversion failed on 02/08/2020.  He was then placed on IV amiodarone and now on p.o. amiodarone.  For simplification, I think it makes sense to place him on 200 mg twice a day on discharge given adequate load here in the hospital setting. Coreg 3.125 -Would plan on repeat cardioversion attempt in 4 weeks.  If unsuccessful consider CRT again with left bundle branch block with EP  consultation. -Continue with Eliquis. No changes today.  Acute on chronic systolic heart failure -Had some difficulty with hypotension.  Currently doing fairly well, laying comfortably.  No significant orthopnea.  No longer able to support either spironolactone or ACE inhibitor.  Carvedilol currently low-dose. No changes today. Lasix 40 BID.  Okay with discharge later today. We will have follow-up with Dr. 02/10/2020 or APP in clinic.      For questions or updates, please contact [facility] Please consult [email or web address] for contact info under        Signed, Delton See, MD  02/13/2020, 10:01 AM

## 2020-02-13 NOTE — Discharge Summary (Signed)
Physician Discharge Summary  [name] XNA:[id number] DOB: 20-Aug-1946 DOA: 02/05/2020  PCP: Lorene Dy, MD  Admit date: 02/05/2020 Discharge date: 02/13/2020  Admitted From: Home Disposition: Home  Recommendations for Outpatient Follow-up:  1. Follow up with PCP in 1 weeks 2. Follow-up with cardiology, Dr. Meda Coffee in 1-2 weeks following hospitalization 3. Started on amiodarone, carvedilol, Eliquis, furosemide for decompensated CHF with A. fib with RVR 4. Discontinue lisinopril secondary to borderline hypotension and need for rate control 5. Please obtain BMP in one week to assess renal function and potassium 6. Recommend repeat CT chest in 2-3 months for incidental findings found chest CT on admission. 7. Patient will need TED hose stockings at least knee-high on discharge and will also require likely oxygen after ambulation by nursing staff later this morning 8. Patient may be high risk for readmission and has been told to follow-up with Dr. Mancel Bale as above  Home Health: PT Equipment/Devices: 3 : 1 bedside commode, oxygen, 2 L per nasal cannula  Discharge Condition: Stable CODE STATUS: Full code Diet recommendation: Heart healthy, low sodium diet  History of present illness:  Bryan Moran is a 74 year old Caucasian male with past medical history remarkable for chronic combined systolic/diastolic congestive heart failure last EF 25-30% with previous refusal of ICD, chronic left bundle branch block, essential hypertension, paroxysmal/persistent atrial fibrillation CKD stage III who presented with progressive shortness of breath, weakness.  In the ER patient was in A. fib with RVR blood pressure was fluctuating between normal to very high and was started on Cardizem infusion. Due to mildly elevated lactate and low normal blood pressure final cc normal saline bolus was given for which blood pressure has remained stable. Patient's creatinine has increased from 1.1 about 3 years ago to  1.70. LFTs are normal abdomen appears benign. Chest x-ray showing features concerning for left hilar mass with upper lobe density for which CT chest was recommended and CT chest showed bilateral hilar airspace disease for which follow-up was recommended. Patient is afebrile denies any productive cough fever or chills. No definite signs of any pneumonia. BNP was elevated at 789 high sensitive troponin was 75-81 lactic acid was 2.4 going improved to 2.1 CBC was unremarkable. Mild lower extremity edema. Stool studies have been ordered. Patient admitted for A. fib with RVR with shortness of breath which likely could be from CHF.   Hospital course:  Acute hypoxic respiratory failure: Resolved Acute on chronic decompensated combined systolic/diastolic CHF exacerbation Patient presenting with progressive shortness of breath, history of medical noncompliance.  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  BNP elevated 789.  TTE with EF 20-25%, severely decreased LV function with global hypokinesis and septal dyssynchrony, severely dilated LV, RV function mildly reduced, LA severely dilated with normal IVC and grade 2 diastolic dysfunction.  Cardiology was consulted and followed during hospital course.  Patient was initially started on IV furosemide and transition to 40 mg p.o. twice daily at discharge.  Continue carvedilol 3.125 mg p.o. twice daily.  Patient was titrated off of supplemental oxygen while at rest but did desaturate on ambulation to 83-86%, will discharge on home supplemental oxygen at 2 L per nasal cannula.  Patient encouraged to maintain daily weight log and to avoid his orange sodas that he normally drinks and adhere to a low-salt diet. He has been given education regarding heart failure, fluid and salt intake and this has been reinforced by me this  morning 5/19  Paroxysmal/permanent atrial fibrillation with  rapid ventricular response Etiology likely secondary to noncompliance with medication regimen.  Previous was on amiodarone and he has stopped for some time now with unclear reason. Underwent DCCV on 02/08/2020 with initial conversion to NSR but unfortunately patient went back into atrial fibrillation 24 hours following cardioversion.  Patient was started on amiodarone drip which is now converted to 400 mg twice daily for 7 days followed by 200 mg twice daily.  Continues on carvedilol 3.125 mg p.o. twice daily and Eliquis for anticoagulation. Cardiology can reconsider repeat DCCV in 4 weeks if remains in A. Fib; with consideration of EP evaluation for CRT with wide LBBB if repeat DCCV failed.  He seems somewhat concerned about the atrial fibrillation taking his life and I spent a good amount of time this morning explaining to him that he is highly functional and should do well with good rate control/rhythm control strategies and even CRT if deemed by cardiology to be appropriate on follow-up  Hypokalemia Repleted during hospitalization.  Continue potassium supplement on discharge.  Recommend repeat BMP in 1 week.  Acute on chronic kidney disease stage IIIa Baseline creatinine 1.3.  Creatinine on admission 1.73 and peaked at 1.90 during the hospitalization.  Patient's home lisinopril was discontinued.  Patient was started on furosemide and will discharge on 40 mg p.o. twice daily.  Creatinine improved to 1.48 at time of discharge.  Recommend repeat BMP in 1 week.  Generalized weakness, Deconditioning Patient currently lives at home alone, no significant family support close as sister lives 90 miles away in [place].  Evaluated by PT recommends home health.  He was amenable to the same on discharge after a long discussion and is aware that he may not have a significant co-pay and that social work will follow up with him and explained the necessity is from him but also what insurance will cover  Patchy  airspace disease, right upper lobe/anterior left hilum Incidental finding on CT chest.  Will need follow-up CT chest in 2-3 months to ensure resolution as underlying neoplastic process cannot be excluded at this time.  Discharge Diagnoses:  Active Problems:   Chronic systolic dysfunction of left ventricle   HTN (hypertension)   Atrial fibrillation with RVR (HCC)   AKI (acute kidney injury) [facility])    Discharge Instructions  Discharge Instructions    (HEART FAILURE PATIENTS) Call MD:  Anytime you have any of the following symptoms: 1) 3 pound weight gain in 24 hours or 5 pounds in 1 week 2) shortness of breath, with or without a dry hacking cough 3) swelling in the hands, feet or stomach 4) if you have to sleep on extra pillows at night in order to breathe.   Complete by: As directed    Amb Referral to Cardiac Rehabilitation   Complete by: As directed    Diagnosis: Heart Failure (see criteria below if ordering Phase II)   Heart Failure Type: Chronic Systolic & Diastolic   After initial evaluation and assessments completed: Virtual Based Care may be provided alone or in conjunction with Phase 2 Cardiac Rehab based on patient barriers.: Yes   Call MD for:  difficulty breathing, headache or visual disturbances   Complete by: As directed    Call MD for:  extreme fatigue   Complete by: As directed    Call MD for:  persistant dizziness or light-headedness   Complete by: As directed    Call MD for:  persistant nausea and vomiting  Complete by: As directed    Call MD for:  severe uncontrolled pain   Complete by: As directed    Call MD for:  temperature >100.4   Complete by: As directed    Diet - low sodium heart healthy   Complete by: As directed    Increase activity slowly   Complete by: As directed    Increase activity slowly   Complete by: As directed      Allergies as of 02/13/2020   No Known Allergies     Medication List    STOP taking these medications   lisinopril 40 MG  tablet Commonly known as: ZESTRIL   spironolactone 25 MG tablet Commonly known as: ALDACTONE     TAKE these medications   amiodarone 200 MG tablet Commonly known as: Pacerone Take 2 tablets (400 mg total) by mouth 2 (two) times daily for 4 days, THEN 1 tablet (200 mg total) 2 (two) times daily. Start taking on: Feb 12, 2020   apixaban 5 MG Tabs tablet Commonly known as: ELIQUIS Take 1 tablet (5 mg total) by mouth 2 (two) times daily.   carvedilol 3.125 MG tablet Commonly known as: COREG Take 1 tablet (3.125 mg total) by mouth 2 (two) times daily with a meal. What changed:   medication strength  how much to take   clotrimazole-betamethasone cream Commonly known as: LOTRISONE Apply 1 application topically daily.   ELETONE EX Apply 1 application topically 2 (two) times daily as needed (eczema).   furosemide 40 MG tablet Commonly known as: LASIX Take 1 tablet (40 mg total) by mouth 2 (two) times daily. What changed:   when to take this  reasons to take this   pantoprazole 40 MG tablet Commonly known as: PROTONIX Take 1 tablet (40 mg total) by mouth daily.   potassium chloride SA 20 MEQ tablet Commonly known as: KLOR-CON Take 1 tablet (20 mEq total) by mouth daily.   triamcinolone cream 0.1 % Commonly known as: KENALOG SMARTSIG:1 Application Topical 2-3 Times Daily            Durable Medical Equipment  (From admission, onward)         Start     Ordered   02/12/20 1421  For home use only DME oxygen  Once    Question Answer Comment  Length of Need Lifetime   Mode or (Route) Nasal cannula   Liters per Minute 2   Frequency Continuous (stationary and portable oxygen unit needed)   Oxygen conserving device Yes   Oxygen delivery system Gas      02/12/20 1420   02/10/20 1043  For home use only DME 3 n 1  Once     02/10/20 1042         Follow-up Information    Burton Apley, MD. Schedule an appointment as soon as possible for a visit in 1 week(s).    Specialty: Internal Medicine Contact information: [address] [place] [address] [phone]        Lars Masson, MD. Schedule an appointment as soon as possible for a visit in 1 week(s).   Specialty: Cardiology Contact information: [address] [place] [address] [phone]          No Known Allergies  Consultations:  Cardiology, Dr. Eden Emms, Dr. Anne Fu   Procedures/Studies: CT Chest Wo Contrast  Result Date: 02/05/2020 CLINICAL DATA:  Shortness of breath. EXAM: CT CHEST WITHOUT CONTRAST TECHNIQUE: Multidetector CT imaging of the  chest was performed following the standard protocol without IV contrast. COMPARISON:  Chest plain film, dated Feb 05, 2020. FINDINGS: Cardiovascular: There is mild calcification of the aortic arch. There is mild to moderate severity cardiomegaly. No pericardial effusion. Mediastinum/Nodes: No enlarged mediastinal or axillary lymph nodes. Thyroid gland, trachea, and esophagus demonstrate no significant findings. Lungs/Pleura: A 3.7 cm x 2.1 cm area of patchy airspace disease is seen within the lateral aspect of the right upper lobe. Additional 7.3 cm x 3.9 cm patchy area of airspace disease is seen along the anterior aspect of the left hilum. This corresponds to the area of abnormality seen within this region on the prior chest plain film. Small patchy areas of airspace disease are seen bilateral lower lobes and posteromedial aspect of the right upper lobe. There are small bilateral pleural effusions. No pneumothorax is identified. Upper Abdomen: Metallic density surgical coils are seen within the right upper quadrant. Musculoskeletal: Multilevel degenerative changes seen throughout the thoracic spine. IMPRESSION: 1. Patchy bilateral areas of airspace disease, most pronounced within the lateral aspect of the right upper lobe and along the anterior aspect of the left hilum. Follow-up to resolution is recommended, as the  presence of an underlying neoplastic process cannot be excluded. 2. Small bilateral pleural effusions. Aortic Atherosclerosis (ICD10-I70.0). Electronically Signed   By: Aram Candela M.D.   On: 02/05/2020 22:05   DG Chest Portable 1 View  Result Date: 02/05/2020 CLINICAL DATA:  Atrial fibrillation with rapid ventricular response EXAM: PORTABLE CHEST 1 VIEW COMPARISON:  06/02/2014 FINDINGS: Two frontal views of the chest demonstrate an enlarged cardiac silhouette with prominent left atrial dilatation. Increased density at the left hilum could reflect left suprahilar consolidation, though underlying mass could be considered. No effusion or pneumothorax. No acute bony abnormalities. IMPRESSION: 1. Left hilar density, differential includes left upper lobe consolidation versus left hilar mass. Follow-up chest CT without IV contrast may be useful when clinical situation permits. 2. Cardiomegaly with enlarged left atrium, stable. Electronically Signed   By: Sharlet Salina M.D.   On: 02/05/2020 19:13   ECHOCARDIOGRAM COMPLETE  Result Date: 02/06/2020    ECHOCARDIOGRAM REPORT   Patient Name:   [facility] Cirillo   Date of Exam: 02/06/2020 Medical Rec #:  [id number]  Height:       75.0 in Accession #:    [id number] Weight:       247.0 lb Date of Birth:  1946-05-14 BSA:          2.400 m Patient Age:    73 years   BP:           101/81 mmHg Patient Gender: M          HR:           99 bpm. Exam Location:  Inpatient Procedure: 2D Echo, Cardiac Doppler and Color Doppler Indications:    Atrial fibrillation  History:        Patient has prior history of Echocardiogram examinations, most                 recent 11/27/2014. CHF, Arrythmias:Atrial Fibrillation and LBBB,                 Signs/Symptoms:Shortness of Breath; Risk Factors:Hypertension.  Sonographer:    Lavenia Atlas Referring Phys: [phone] ARSHAD N KAKRAKANDY IMPRESSIONS  1. Left ventricular ejection fraction, by estimation, is 20 to 25%. The left ventricle has severely  decreased function. The left ventricle demonstrates global hypokinesis with significant septal dyssynchrony. The left  ventricular internal cavity size was severely dilated. There is mild concentric left ventricular hypertrophy.  2. Right ventricular systolic function is moderately reduced. The right ventricular size is moderately enlarged. There is normal pulmonary artery systolic pressure. The estimated right ventricular systolic pressure is 26.2 mmHg.  3. Left atrial size was severely dilated.  4. The mitral valve is normal in structure. Mild mitral valve regurgitation. No evidence of mitral stenosis.  5. The aortic valve is normal in structure. Aortic valve regurgitation is mild. No aortic stenosis is present.  6. The inferior vena cava is normal in size with greater than 50% respiratory variability, suggesting right atrial pressure of 3 mmHg.  7. Right atrial size was mildly dilated. FINDINGS  Left Ventricle: Left ventricular ejection fraction, by estimation, is <20%. The left ventricle has severely decreased function. The left ventricle demonstrates global hypokinesis. The left ventricular internal cavity size was severely dilated. There is mild concentric left ventricular hypertrophy. Abnormal (paradoxical) septal motion, consistent with left bundle branch block. Left ventricular diastolic parameters are consistent with Grade II diastolic dysfunction (pseudonormalization). Elevated left atrial pressure. Right Ventricle: The right ventricular size is moderately enlarged. No increase in right ventricular wall thickness. Right ventricular systolic function is moderately reduced. There is normal pulmonary artery systolic pressure. The tricuspid regurgitant velocity is 2.41 m/s, and with an assumed right atrial pressure of 3 mmHg, the estimated right ventricular systolic pressure is 26.2 mmHg. Left Atrium: Left atrial size was severely dilated. Right Atrium: Right atrial size was mildly dilated. Pericardium:  Trivial pericardial effusion is present. There is no evidence of cardiac tamponade. Mitral Valve: The mitral valve is normal in structure. Normal mobility of the mitral valve leaflets. Mild mitral valve regurgitation, with centrally-directed jet. No evidence of mitral valve stenosis. Tricuspid Valve: The tricuspid valve is normal in structure. Tricuspid valve regurgitation is mild . No evidence of tricuspid stenosis. Aortic Valve: The aortic valve is normal in structure. Aortic valve regurgitation is mild. No aortic stenosis is present. Pulmonic Valve: The pulmonic valve was normal in structure. Pulmonic valve regurgitation is not visualized. No evidence of pulmonic stenosis. Aorta: The aortic root is normal in size and structure. Venous: The inferior vena cava is normal in size with greater than 50% respiratory variability, suggesting right atrial pressure of 3 mmHg. IAS/Shunts: No atrial level shunt detected by color flow Doppler.  LEFT VENTRICLE PLAX 2D LVIDd:         7.20 cm  Diastology LVIDs:         6.50 cm  LV e' lateral:   4.24 cm/s LV PW:         1.10 cm  LV E/e' lateral: 19.0 LV IVS:        1.10 cm  LV e' medial:    4.79 cm/s LVOT diam:     2.70 cm  LV E/e' medial:  16.8 LV SV:         64 LV SV Index:   26 LVOT Area:     5.73 cm  RIGHT VENTRICLE RV Basal diam:  4.20 cm RV S prime:     9.36 cm/s TAPSE (M-mode): 2.5 cm LEFT ATRIUM             Index       RIGHT ATRIUM           Index LA diam:        5.60 cm 2.33 cm/m  RA Area:     19.40 cm LA Vol (A2C):  71.5 ml 29.79 ml/m RA Volume:   48.20 ml  20.08 ml/m LA Vol (A4C):   89.1 ml 37.12 ml/m LA Biplane Vol: 88.0 ml 36.66 ml/m  AORTIC VALVE LVOT Vmax:   69.20 cm/s LVOT Vmean:  43.300 cm/s LVOT VTI:    0.111 m  AORTA Ao Root diam: 3.30 cm MITRAL VALVE               TRICUSPID VALVE MV Area (PHT): 4.80 cm    TR Peak grad:   23.2 mmHg MV Decel Time: 158 msec    TR Vmax:        241.00 cm/s MV E velocity: 80.60 cm/s MV A velocity: 47.60 cm/s  SHUNTS MV E/A  ratio:  1.69        Systemic VTI:  0.11 m                            Systemic Diam: 2.70 cm Tobias Alexander MD Electronically signed by Tobias Alexander MD Signature Date/Time: 02/06/2020/4:14:29 PM    Final    ECHO TEE  Result Date: 02/08/2020    TRANSESOPHOGEAL ECHO REPORT   Patient Name:   [facility] K[place]   Date of Exam: 02/08/2020 Medical Rec #:  [id number]  Height:       75.0 in Accession #:    [id number] Weight:       245.6 lb Date of Birth:  [birth year] BSA:          2.395 m Patient Age:    73 years   BP:           100/65 mmHg Patient Gender: M          HR:           93 bpm. Exam Location:  Inpatient Procedure: Transesophageal Echo, Cardiac Doppler and Color Doppler Indications:     Atrial fibrillation  History:         Patient has prior history of Echocardiogram examinations, most                  recent 02/06/2020. CHF, Arrythmias:Atrial Fibrillation; Risk                  Factors:Hypertension.  Sonographer:     Ross Ludwig RDCS (AE) Referring Phys:  [id number] Wendall Stade Diagnosing Phys: Chilton Si MD PROCEDURE: After discussion of the risks and benefits of a TEE, an informed consent was obtained from the patient. The transesophogeal probe was passed without difficulty through the esophogus of the patient. Local oropharyngeal anesthetic was provided with Cetacaine. Sedation performed by different physician. Image quality was adequate. The patient's vital signs; including heart rate, blood pressure, and oxygen saturation; remained stable throughout the procedure. The patient developed no complications during the procedure. A successful direct current cardioversion was performed at 150 joules with 1 attempt. IMPRESSIONS  1. Left ventricular ejection fraction, by estimation, is 20 to 25%. The left ventricle has severely decreased function. The left ventricle demonstrates global hypokinesis.  2. Right ventricular systolic function is normal. The right ventricular size is normal. There is normal pulmonary artery  systolic pressure.  3. Prominent left atrial appendage trabeculations without evidence of thrombus. No left atrial/left atrial appendage thrombus was detected. The LAA emptying velocity was 28 cm/s.  4. The mitral valve is normal in structure. Moderate mitral valve regurgitation. No evidence of mitral stenosis.  5. The aortic valve is tricuspid. Aortic valve regurgitation is mild.  No aortic stenosis is present.  6. The inferior vena cava is normal in size with greater than 50% respiratory variability, suggesting right atrial pressure of 3 mmHg. FINDINGS  Left Ventricle: Left ventricular ejection fraction, by estimation, is 20 to 25%. The left ventricle has severely decreased function. The left ventricle demonstrates global hypokinesis. The left ventricular internal cavity size was normal in size. There is no left ventricular hypertrophy. Right Ventricle: The right ventricular size is normal. No increase in right ventricular wall thickness. Right ventricular systolic function is normal. There is normal pulmonary artery systolic pressure. The tricuspid regurgitant velocity is 2.14 m/s, and  with an assumed right atrial pressure of 10 mmHg, the estimated right ventricular systolic pressure is 28.3 mmHg. Left Atrium: Prominent left atrial appendage trabeculations without evidence of thrombus. Left atrial size was normal in size. No left atrial/left atrial appendage thrombus was detected. The LAA emptying velocity was 28 cm/s. Right Atrium: Right atrial size was normal in size. Pericardium: There is no evidence of pericardial effusion. Mitral Valve: Multiple mitral valve regurgitant jets. The mitral valve is normal in structure. Normal mobility of the mitral valve leaflets. Moderate mitral valve regurgitation. No evidence of mitral valve stenosis. Tricuspid Valve: The tricuspid valve is normal in structure. Tricuspid valve regurgitation is mild . No evidence of tricuspid stenosis. Aortic Valve: The aortic valve is  tricuspid. Aortic valve regurgitation is mild. No aortic stenosis is present. Pulmonic Valve: The pulmonic valve was normal in structure. Pulmonic valve regurgitation is mild. No evidence of pulmonic stenosis. Aorta: The aortic root is normal in size and structure. Venous: The inferior vena cava is normal in size with greater than 50% respiratory variability, suggesting right atrial pressure of 3 mmHg. IAS/Shunts: No atrial level shunt detected by color flow Doppler.  MR Peak grad:    42.2 mmHg   TRICUSPID VALVE MR Mean grad:    21.0 mmHg   TR Peak grad:   18.3 mmHg MR Vmax:         325.00 cm/s TR Vmax:        214.00 cm/s MR Vmean:        200.0 cm/s MR PISA:         4.02 cm MR PISA Eff ROA: 43 mm MR PISA Radius:  0.80 cm Chilton Si MD Electronically signed by Chilton Si MD Signature Date/Time: 02/08/2020/3:20:37 PM    Final      Subjective: Looking comfortable sitting down in the chair States he is ready to go home When I take him off his oxygen he desats to the 80s He still feels swollen but it is improved in his lower extremities from before per his own admission He is willing to wear stockings No chest pain No fever No chills No diarrhea  Discharge Exam: Vitals:   02/13/20 0315 02/13/20 0700  BP: 96/83 112/82  Pulse: (!) 110 (!) 107  Resp: 20 (!) 24  Temp: 97.7 F (36.5 C)   SpO2: 92% 93%   Vitals:   02/13/20 0100 02/13/20 0315 02/13/20 0329 02/13/20 0700  BP:  96/83  112/82  Pulse: 87 (!) 110  (!) 107  Resp:  20  (!) 24  Temp:  97.7 F (36.5 C)    TempSrc:  Oral    SpO2:  92%  93%  Weight:   113.4 kg   Height:        General: Pt is alert, awake, not in acute distress Cardiovascular: Irregularly irregular rhythm, S1/S2 +, no rubs,  no gallops-atrial fibrillation on monitor controlled in the 100-1 15 range Respiratory: CTA bilaterally, no wheezing, no rhonchi Abdominal: Soft, NT, ND, bowel sounds + Extremities: 2+ pitting edema bilateral lower extremities, no  cyanosis    The results of significant diagnostics from this hospitalization (including imaging, microbiology, ancillary and laboratory) are listed below for reference.     Microbiology: Recent Results (from the past 240 hour(s))  SARS Coronavirus 2 by RT PCR (hospital order, performed in [facility] hospital lab) Nasopharyngeal Nasopharyngeal Swab     Status: None   Collection Time: 02/05/20  6:36 PM   Specimen: Nasopharyngeal Swab  Result Value Ref Range Status   SARS Coronavirus 2 NEGATIVE NEGATIVE Final    Comment: (NOTE) SARS-CoV-2 target nucleic acids are NOT DETECTED. The SARS-CoV-2 RNA is generally detectable in upper and lower respiratory specimens during the acute phase of infection. The lowest concentration of SARS-CoV-2 viral copies this assay can detect is 250 copies / mL. A negative result does not preclude SARS-CoV-2 infection and should not be used as the sole basis for treatment or other patient management decisions.  A negative result may occur with improper specimen collection / handling, submission of specimen other than nasopharyngeal swab, presence of viral mutation(s) within the areas targeted by this assay, and inadequate number of viral copies (<250 copies / mL). A negative result must be combined with clinical observations, patient history, and epidemiological information. Fact Sheet for Patients:   BoilerBrush.com.cy Fact Sheet for Healthcare Providers: https://pope.com/ This test is not yet approved or cleared  by the [place] FDA and has been authorized for detection and/or diagnosis of SARS-CoV-2 by FDA under an Emergency Use Authorization (EUA).  This EUA will remain in effect (meaning this test can be used) for the duration of the COVID-19 declaration under Section 564(b)(1) of the Act, 21 U.S.C. section 360bbb-3(b)(1), unless the authorization is terminated or revoked sooner. Performed at [facility], [address], [place], [place] [address]   Culture, blood (routine x 2)     Status: None   Collection Time: 02/05/20 11:05 PM   Specimen: BLOOD LEFT HAND  Result Value Ref Range Status   Specimen Description BLOOD LEFT HAND  Final   Special Requests   Final    BOTTLES DRAWN AEROBIC AND ANAEROBIC Blood Culture adequate volume   Culture   Final    NO GROWTH 5 DAYS Performed at [facility], [address], [place], [place] [address]    Report Status 02/10/2020 FINAL  Final  Urine culture     Status: None   Collection Time: 02/06/20  1:48 AM   Specimen: Urine, Random  Result Value Ref Range Status   Specimen Description URINE, RANDOM  Final   Special Requests NONE  Final   Culture   Final    NO GROWTH Performed at [facility], [address] [place], [place] [address]    Report Status 02/06/2020 FINAL  Final  Culture, blood (routine x 2)     Status: None   Collection Time: 02/06/20  5:10 AM   Specimen: BLOOD  Result Value Ref Range Status   Specimen Description BLOOD LEFT HAND  Final   Special Requests   Final    BOTTLES DRAWN AEROBIC AND ANAEROBIC Blood Culture adequate volume   Culture   Final    NO GROWTH 5 DAYS Performed at [facility], [address] [place], [place] [address]    Report  Status 02/11/2020 FINAL  Final  MRSA PCR Screening     Status: None   Collection Time: 02/06/20  3:47 PM   Specimen: Nasopharyngeal  Result Value Ref Range Status   MRSA by PCR NEGATIVE NEGATIVE Final    Comment:        The GeneXpert MRSA Assay (FDA approved for NASAL specimens only), is one component of a comprehensive MRSA colonization surveillance program. It is not intended to diagnose MRSA infection nor to guide or monitor treatment for MRSA infections. Performed at [facility], [address] [place], [place] [address]   C Difficile Quick Screen w PCR reflex     Status: None   Collection Time: 02/06/20  5:53 PM   Specimen:  STOOL  Result Value Ref Range Status   C Diff antigen NEGATIVE NEGATIVE Final   C Diff toxin NEGATIVE NEGATIVE Final   C Diff interpretation No C. difficile detected.  Final    Comment: Performed at [facility], [address] [place], [place] [address]     Labs: BNP (last 3 results) Recent Labs    02/05/20 1802  BNP 789.9*   Basic Metabolic Panel: Recent Labs  Lab 02/07/20 0217 02/07/20 0217 02/08/20 0243 02/09/20 0205 02/10/20 0153 02/11/20 0206 02/12/20 0216  NA 140   < > 139 140 140 141 140  K 4.2   < > 3.5 3.3* 4.0 3.8 3.9  CL 112*   < > 109 107 108 104 107  CO2 18*   < > 22 24 23 25 24   GLUCOSE 122*   < > 112* 119* 103* 106* 125*  BUN 36*   < > 37* 39* 33* 33* 34*  CREATININE 1.90*   < > 1.81* 1.67* 1.47* 1.60* 1.48*  CALCIUM 8.1*   < > 8.1* 7.7* 7.8* 8.0* 8.2*  MG 2.3  --  2.1 2.0 1.9 1.9  --    < > = values in this interval not displayed.   Liver Function Tests: Recent Labs  Lab 02/07/20 0217 02/08/20 0243 02/09/20 0205  AST 19 14* 16  ALT 29 30 27   ALKPHOS 50 52 49  BILITOT 1.5* 1.2 1.5*  PROT 4.9* 5.2* 5.0*  ALBUMIN 2.6* 2.7* 2.5*   No results for input(s): LIPASE, AMYLASE in the last 168 hours. No results for input(s): AMMONIA in the last 168 hours. CBC: Recent Labs  Lab 02/07/20 0217 02/08/20 0243 02/09/20 0205 02/10/20 0153 02/11/20 0206  WBC 8.4 7.8 9.2 6.9 8.6  HGB 15.3 15.3 15.0 15.0 15.1  HCT 47.9 47.7 46.5 46.4 46.8  MCV 99.6 97.9 97.3 98.1 98.1  PLT 138* 137* 129* 129* 131*   Cardiac Enzymes: No results for input(s): CKTOTAL, CKMB, CKMBINDEX, TROPONINI in the last 168 hours. BNP: Invalid input(s): POCBNP CBG: No results for input(s): GLUCAP in the last 168 hours. D-Dimer No results for input(s): DDIMER in the last 72 hours. Hgb A1c No results for input(s): HGBA1C in the last 72 hours. Lipid Profile No results for input(s): CHOL, HDL, LDLCALC, TRIG, CHOLHDL, LDLDIRECT in the last 72 hours. Thyroid function  studies No results for input(s): TSH, T4TOTAL, T3FREE, THYROIDAB in the last 72 hours.  Invalid input(s): FREET3 Anemia work up No results for input(s): VITAMINB12, FOLATE, FERRITIN, TIBC, IRON, RETICCTPCT in the last 72 hours. Urinalysis    Component Value Date/Time   COLORURINE AMBER (A) 02/06/2020 0148   APPEARANCEUR HAZY (A) 02/06/2020 0148   LABSPEC 1.026 02/06/2020 0148  PHURINE 5.0 02/06/2020 0148   GLUCOSEU NEGATIVE 02/06/2020 0148   HGBUR SMALL (A) 02/06/2020 0148   BILIRUBINUR NEGATIVE 02/06/2020 0148   KETONESUR NEGATIVE 02/06/2020 0148   PROTEINUR 30 (A) 02/06/2020 0148   NITRITE NEGATIVE 02/06/2020 0148   LEUKOCYTESUR NEGATIVE 02/06/2020 0148   Sepsis Labs Invalid input(s): PROCALCITONIN,  WBC,  LACTICIDVEN Microbiology Recent Results (from the past 240 hour(s))  SARS Coronavirus 2 by RT PCR (hospital order, performed in [facility] hospital lab) Nasopharyngeal Nasopharyngeal Swab     Status: None   Collection Time: 02/05/20  6:36 PM   Specimen: Nasopharyngeal Swab  Result Value Ref Range Status   SARS Coronavirus 2 NEGATIVE NEGATIVE Final    Comment: (NOTE) SARS-CoV-2 target nucleic acids are NOT DETECTED. The SARS-CoV-2 RNA is generally detectable in upper and lower respiratory specimens during the acute phase of infection. The lowest concentration of SARS-CoV-2 viral copies this assay can detect is 250 copies / mL. A negative result does not preclude SARS-CoV-2 infection and should not be used as the sole basis for treatment or other patient management decisions.  A negative result may occur with improper specimen collection / handling, submission of specimen other than nasopharyngeal swab, presence of viral mutation(s) within the areas targeted by this assay, and inadequate number of viral copies (<250 copies / mL). A negative result must be combined with clinical observations, patient history, and epidemiological information. Fact Sheet for Patients:    BoilerBrush.com.cy Fact Sheet for Healthcare Providers: https://pope.com/ This test is not yet approved or cleared  by the [place] FDA and has been authorized for detection and/or diagnosis of SARS-CoV-2 by FDA under an Emergency Use Authorization (EUA).  This EUA will remain in effect (meaning this test can be used) for the duration of the COVID-19 declaration under Section 564(b)(1) of the Act, 21 U.S.C. section 360bbb-3(b)(1), unless the authorization is terminated or revoked sooner. Performed at [facility], [address], [place], [place] [address]   Culture, blood (routine x 2)     Status: None   Collection Time: 02/05/20 11:05 PM   Specimen: BLOOD LEFT HAND  Result Value Ref Range Status   Specimen Description BLOOD LEFT HAND  Final   Special Requests   Final    BOTTLES DRAWN AEROBIC AND ANAEROBIC Blood Culture adequate volume   Culture   Final    NO GROWTH 5 DAYS Performed at [facility], [address], [place], [place] [address]    Report Status 02/10/2020 FINAL  Final  Urine culture     Status: None   Collection Time: 02/06/20  1:48 AM   Specimen: Urine, Random  Result Value Ref Range Status   Specimen Description URINE, RANDOM  Final   Special Requests NONE  Final   Culture   Final    NO GROWTH Performed at [facility], [address] [place], [place] [address]    Report Status 02/06/2020 FINAL  Final  Culture, blood (routine x 2)     Status: None   Collection Time: 02/06/20  5:10 AM   Specimen: BLOOD  Result Value Ref Range Status   Specimen Description BLOOD LEFT HAND  Final   Special Requests   Final    BOTTLES DRAWN AEROBIC AND ANAEROBIC Blood Culture adequate volume   Culture   Final    NO GROWTH 5 DAYS Performed at [facility], [address], [place], [place] [address]    Report Status 02/11/2020  FINAL  Final  MRSA PCR Screening     Status: None   Collection Time:  02/06/20  3:47 PM   Specimen: Nasopharyngeal  Result Value Ref Range Status   MRSA by PCR NEGATIVE NEGATIVE Final    Comment:        The GeneXpert MRSA Assay (FDA approved for NASAL specimens only), is one component of a comprehensive MRSA colonization surveillance program. It is not intended to diagnose MRSA infection nor to guide or monitor treatment for MRSA infections. Performed at [facility], [address] [place], [place] [address]   C Difficile Quick Screen w PCR reflex     Status: None   Collection Time: 02/06/20  5:53 PM   Specimen: STOOL  Result Value Ref Range Status   C Diff antigen NEGATIVE NEGATIVE Final   C Diff toxin NEGATIVE NEGATIVE Final   C Diff interpretation No C. difficile detected.  Final    Comment: Performed at [facility], [address], [place], [place] [address]     Time coordinating discharge: 15 to 20 minutes  SIGNED:   Rhetta Mura,  [facility] 02/13/2020, 8:57 AM

## 2020-02-14 ENCOUNTER — Inpatient Hospital Stay (HOSPITAL_COMMUNITY)
Admission: EM | Admit: 2020-02-14 | Discharge: 2020-02-25 | DRG: 291 | Disposition: A | Discharge status: Hospice | Payer: BC Managed Care – PPO | Attending: Internal Medicine | Admitting: Internal Medicine

## 2020-02-14 ENCOUNTER — Emergency Department (HOSPITAL_COMMUNITY): Payer: BC Managed Care – PPO

## 2020-02-14 ENCOUNTER — Encounter (HOSPITAL_COMMUNITY): Payer: Self-pay | Admitting: Emergency Medicine

## 2020-02-14 DIAGNOSIS — E872 Acidosis: Secondary | ICD-10-CM | POA: Diagnosis present

## 2020-02-14 DIAGNOSIS — N1832 Chronic kidney disease, stage 3b: Secondary | ICD-10-CM | POA: Diagnosis present

## 2020-02-14 DIAGNOSIS — I5082 Biventricular heart failure: Secondary | ICD-10-CM | POA: Diagnosis present

## 2020-02-14 DIAGNOSIS — W19XXXA Unspecified fall, initial encounter: Secondary | ICD-10-CM

## 2020-02-14 DIAGNOSIS — Z66 Do not resuscitate: Secondary | ICD-10-CM | POA: Diagnosis present

## 2020-02-14 DIAGNOSIS — D696 Thrombocytopenia, unspecified: Secondary | ICD-10-CM | POA: Diagnosis present

## 2020-02-14 DIAGNOSIS — J9621 Acute and chronic respiratory failure with hypoxia: Secondary | ICD-10-CM | POA: Diagnosis present

## 2020-02-14 DIAGNOSIS — R570 Cardiogenic shock: Secondary | ICD-10-CM | POA: Diagnosis present

## 2020-02-14 DIAGNOSIS — Z6831 Body mass index (BMI) 31.0-31.9, adult: Secondary | ICD-10-CM

## 2020-02-14 DIAGNOSIS — Z452 Encounter for adjustment and management of vascular access device: Secondary | ICD-10-CM

## 2020-02-14 DIAGNOSIS — I509 Heart failure, unspecified: Secondary | ICD-10-CM | POA: Diagnosis not present

## 2020-02-14 DIAGNOSIS — Z7189 Other specified counseling: Secondary | ICD-10-CM

## 2020-02-14 DIAGNOSIS — I5043 Acute on chronic combined systolic (congestive) and diastolic (congestive) heart failure: Secondary | ICD-10-CM | POA: Diagnosis present

## 2020-02-14 DIAGNOSIS — Z20822 Contact with and (suspected) exposure to covid-19: Secondary | ICD-10-CM | POA: Diagnosis present

## 2020-02-14 DIAGNOSIS — I130 Hypertensive heart and chronic kidney disease with heart failure and stage 1 through stage 4 chronic kidney disease, or unspecified chronic kidney disease: Principal | ICD-10-CM | POA: Diagnosis present

## 2020-02-14 DIAGNOSIS — R7989 Other specified abnormal findings of blood chemistry: Secondary | ICD-10-CM

## 2020-02-14 DIAGNOSIS — R5383 Other fatigue: Secondary | ICD-10-CM

## 2020-02-14 DIAGNOSIS — N179 Acute kidney failure, unspecified: Secondary | ICD-10-CM | POA: Diagnosis present

## 2020-02-14 DIAGNOSIS — E876 Hypokalemia: Secondary | ICD-10-CM | POA: Diagnosis not present

## 2020-02-14 DIAGNOSIS — I4819 Other persistent atrial fibrillation: Secondary | ICD-10-CM | POA: Diagnosis present

## 2020-02-14 DIAGNOSIS — Z79899 Other long term (current) drug therapy: Secondary | ICD-10-CM

## 2020-02-14 DIAGNOSIS — I5023 Acute on chronic systolic (congestive) heart failure: Secondary | ICD-10-CM

## 2020-02-14 DIAGNOSIS — Z8249 Family history of ischemic heart disease and other diseases of the circulatory system: Secondary | ICD-10-CM

## 2020-02-14 DIAGNOSIS — E43 Unspecified severe protein-calorie malnutrition: Secondary | ICD-10-CM | POA: Diagnosis present

## 2020-02-14 DIAGNOSIS — K5900 Constipation, unspecified: Secondary | ICD-10-CM | POA: Diagnosis not present

## 2020-02-14 DIAGNOSIS — Z7901 Long term (current) use of anticoagulants: Secondary | ICD-10-CM

## 2020-02-14 DIAGNOSIS — Z8711 Personal history of peptic ulcer disease: Secondary | ICD-10-CM

## 2020-02-14 DIAGNOSIS — Z515 Encounter for palliative care: Secondary | ICD-10-CM

## 2020-02-14 DIAGNOSIS — Z808 Family history of malignant neoplasm of other organs or systems: Secondary | ICD-10-CM

## 2020-02-14 DIAGNOSIS — R54 Age-related physical debility: Secondary | ICD-10-CM | POA: Diagnosis present

## 2020-02-14 DIAGNOSIS — I428 Other cardiomyopathies: Secondary | ICD-10-CM | POA: Diagnosis present

## 2020-02-14 DIAGNOSIS — E875 Hyperkalemia: Secondary | ICD-10-CM | POA: Diagnosis present

## 2020-02-14 DIAGNOSIS — J9600 Acute respiratory failure, unspecified whether with hypoxia or hypercapnia: Secondary | ICD-10-CM

## 2020-02-14 DIAGNOSIS — I5084 End stage heart failure: Secondary | ICD-10-CM | POA: Diagnosis present

## 2020-02-14 DIAGNOSIS — K117 Disturbances of salivary secretion: Secondary | ICD-10-CM | POA: Diagnosis present

## 2020-02-14 DIAGNOSIS — I447 Left bundle-branch block, unspecified: Secondary | ICD-10-CM | POA: Diagnosis present

## 2020-02-14 HISTORY — DX: Heart failure, unspecified: I50.9

## 2020-02-14 LAB — CBC WITH DIFFERENTIAL/PLATELET
Abs Immature Granulocytes: 0.04 10*3/uL (ref 0.00–0.07)
Basophils Absolute: 0 10*3/uL (ref 0.0–0.1)
Basophils Relative: 0 %
Eosinophils Absolute: 0 10*3/uL (ref 0.0–0.5)
Eosinophils Relative: 0 %
HCT: 49.4 % (ref 39.0–52.0)
Hemoglobin: 16 g/dL (ref 13.0–17.0)
Immature Granulocytes: 0 %
Lymphocytes Relative: 1 %
Lymphs Abs: 0.2 10*3/uL — ABNORMAL LOW (ref 0.7–4.0)
MCH: 31.4 pg (ref 26.0–34.0)
MCHC: 32.4 g/dL (ref 30.0–36.0)
MCV: 96.9 fL (ref 80.0–100.0)
Monocytes Absolute: 0.5 10*3/uL (ref 0.1–1.0)
Monocytes Relative: 4 %
Neutro Abs: 11.2 10*3/uL — ABNORMAL HIGH (ref 1.7–7.7)
Neutrophils Relative %: 95 %
Platelets: 143 10*3/uL — ABNORMAL LOW (ref 150–400)
RBC: 5.1 MIL/uL (ref 4.22–5.81)
RDW: 14.9 % (ref 11.5–15.5)
WBC: 11.9 10*3/uL — ABNORMAL HIGH (ref 4.0–10.5)
nRBC: 0 % (ref 0.0–0.2)

## 2020-02-14 LAB — BASIC METABOLIC PANEL
Anion gap: 16 — ABNORMAL HIGH (ref 5–15)
BUN: 63 mg/dL — ABNORMAL HIGH (ref 8–23)
CO2: 21 mmol/L — ABNORMAL LOW (ref 22–32)
Calcium: 8.7 mg/dL — ABNORMAL LOW (ref 8.9–10.3)
Chloride: 103 mmol/L (ref 98–111)
Creatinine, Ser: 2.73 mg/dL — ABNORMAL HIGH (ref 0.61–1.24)
GFR calc Af Amer: 26 mL/min — ABNORMAL LOW (ref >60)
GFR calc non Af Amer: 22 mL/min — ABNORMAL LOW (ref >60)
Glucose, Bld: 137 mg/dL — ABNORMAL HIGH (ref 70–99)
Potassium: 5.4 mmol/L — ABNORMAL HIGH (ref 3.5–5.1)
Sodium: 140 mmol/L (ref 135–145)

## 2020-02-14 LAB — I-STAT CHEM 8, ED
BUN: 56 mg/dL — ABNORMAL HIGH (ref 8–23)
Calcium, Ion: 1.12 mmol/L — ABNORMAL LOW (ref 1.15–1.40)
Chloride: 102 mmol/L (ref 98–111)
Creatinine, Ser: 2.3 mg/dL — ABNORMAL HIGH (ref 0.61–1.24)
Glucose, Bld: 128 mg/dL — ABNORMAL HIGH (ref 70–99)
HCT: 50 % (ref 39.0–52.0)
Hemoglobin: 17 g/dL (ref 13.0–17.0)
Potassium: 4.4 mmol/L (ref 3.5–5.1)
Sodium: 139 mmol/L (ref 135–145)
TCO2: 25 mmol/L (ref 22–32)

## 2020-02-14 LAB — HEPATIC FUNCTION PANEL
ALT: 133 U/L — ABNORMAL HIGH (ref 0–44)
AST: 108 U/L — ABNORMAL HIGH (ref 15–41)
Albumin: 2.5 g/dL — ABNORMAL LOW (ref 3.5–5.0)
Alkaline Phosphatase: 208 U/L — ABNORMAL HIGH (ref 38–126)
Bilirubin, Direct: 2.7 mg/dL — ABNORMAL HIGH (ref 0.0–0.2)
Indirect Bilirubin: 3.5 mg/dL — ABNORMAL HIGH (ref 0.3–0.9)
Total Bilirubin: 6.2 mg/dL — ABNORMAL HIGH (ref 0.3–1.2)
Total Protein: 5.4 g/dL — ABNORMAL LOW (ref 6.5–8.1)

## 2020-02-14 LAB — URINALYSIS, ROUTINE W REFLEX MICROSCOPIC
Bacteria, UA: NONE SEEN
Bilirubin Urine: NEGATIVE
Glucose, UA: NEGATIVE mg/dL
Ketones, ur: NEGATIVE mg/dL
Leukocytes,Ua: NEGATIVE
Nitrite: NEGATIVE
Protein, ur: NEGATIVE mg/dL
Specific Gravity, Urine: 1.015 (ref 1.005–1.030)
pH: 5 (ref 5.0–8.0)

## 2020-02-14 LAB — POCT I-STAT EG7
Acid-base deficit: 1 mmol/L (ref 0.0–2.0)
Bicarbonate: 25.7 mmol/L (ref 20.0–28.0)
Calcium, Ion: 1.12 mmol/L — ABNORMAL LOW (ref 1.15–1.40)
HCT: 49 % (ref 39.0–52.0)
Hemoglobin: 16.7 g/dL (ref 13.0–17.0)
O2 Saturation: 66 %
Potassium: 4.3 mmol/L (ref 3.5–5.1)
Sodium: 138 mmol/L (ref 135–145)
TCO2: 27 mmol/L (ref 22–32)
pCO2, Ven: 47.9 mmHg (ref 44.0–60.0)
pH, Ven: 7.338 (ref 7.250–7.430)
pO2, Ven: 37 mmHg (ref 32.0–45.0)

## 2020-02-14 LAB — PROTIME-INR
INR: 2.9 — ABNORMAL HIGH (ref 0.8–1.2)
Prothrombin Time: 29.3 seconds — ABNORMAL HIGH (ref 11.4–15.2)

## 2020-02-14 LAB — MAGNESIUM: Magnesium: 2.2 mg/dL (ref 1.7–2.4)

## 2020-02-14 LAB — SAMPLE TO BLOOD BANK

## 2020-02-14 LAB — LACTIC ACID, PLASMA: Lactic Acid, Venous: 3.5 mmol/L (ref 0.5–1.9)

## 2020-02-14 LAB — SARS CORONAVIRUS 2 BY RT PCR (HOSPITAL ORDER, PERFORMED IN ~~LOC~~ HOSPITAL LAB): SARS Coronavirus 2: NEGATIVE

## 2020-02-14 MED ORDER — INSULIN ASPART 100 UNIT/ML IV SOLN
5.0000 [IU] | Freq: Once | INTRAVENOUS | Status: AC
  Administered 2020-02-15: 5 [IU] via INTRAVENOUS

## 2020-02-14 MED ORDER — DEXTROSE 50 % IV SOLN
1.0000 | Freq: Once | INTRAVENOUS | Status: AC
  Administered 2020-02-15: 50 mL via INTRAVENOUS
  Filled 2020-02-14: qty 50

## 2020-02-14 MED ORDER — SODIUM ZIRCONIUM CYCLOSILICATE 10 G PO PACK
10.0000 g | PACK | Freq: Once | ORAL | Status: AC
  Administered 2020-02-15: 10 g via ORAL
  Filled 2020-02-14: qty 1

## 2020-02-14 NOTE — ED Triage Notes (Signed)
Pt presents from home where he fell today (on eliquis). States he slips backwards after losing his balance, remembers the fall, denies LOC/head or back injury, denies dizziness. Discharged from IP admission yesterday for CHF, never had home O2 set up or began taking new rx for amiodarone or carvedilol.   EMS exam: shob but clear lung fields, weakness, provided 150cc bolus and then line blew, BP 107/70, afib on monitor.

## 2020-02-14 NOTE — ED Provider Notes (Signed)
[facility] EMERGENCY DEPARTMENT Provider Note   CSN: [id number] Arrival date & time: 02/14/20  2041     History Chief Complaint  Patient presents with  . Fall    Bryan Moran is a 74 y.o. male.  HPI Patient is a 74 year old male who presents after a fall at home.  Recent medical history is as follows: He was admitted 9 days ago for A. fib, shortness of breath, CHF, and diarrhea.  During admission, he underwent cardioversion but went back into A. fib the next day.  He was started on amiodarone.  He continued his Coreg.  He was put on Eliquis.  Echocardiogram showed LVEF of 20 to 25% with moderately reduced RV function as well.  He was put on Lasix, 40 mg twice daily.  Due to hypotension while hospitalized, lisinopril and spironolactone were discontinued.  He was found to be hypoxic with ambulation and so he was discharged home with plan for 2 L of supplemental oxygen.  Patient was discharged yesterday.  He lives at home alone.  He reports that oxygen and medications were delivered to his house, although he was not able to take any of them.  He reports going home and feeling severely fatigued.  All he wanted to do was sleep.  Earlier today, he was able to get up and pay some bills.  While standing, he took a couple steps backwards and lost his balance.  He fell backwards into his bed and then onto the floor.  He does not believe that he hurt anything during the fall.  Once on the ground, he was too weak to stand back up.  He was able to shimmy over to a phone and called EMS.  In the ED, patient denies any acute pain.  He endorses continued fatigue and generalized weakness.  Since discharge, he denies any chest pain, chills, cough, diarrhea, vomiting, or dysuria.    Past Medical History:  Diagnosis Date  . A-fib ([place])   . CHF (congestive heart failure) ([place])   . Hypertension     Patient Active Problem List   Diagnosis Date Noted  . Decompensated heart failure ([place]) 02/15/2020       History reviewed. No pertinent family history.  Social History   Tobacco Use  . Smoking status: Not on file  Substance Use Topics  . Alcohol use: Not on file  . Drug use: Not on file    Home Medications Prior to Admission medications   Medication Sig Start Date End Date Taking? Authorizing Provider  amiodarone (PACERONE) 200 MG tablet Take 1 tablet by mouth daily. 02/14/20   [provider]  carvedilol (COREG) 3.125 MG tablet Take 3.125 mg by mouth 2 (two) times daily. 02/14/20   [provider]  ELIQUIS 5 MG TABS tablet Take 5 mg by mouth 2 (two) times daily. 02/14/20   [provider]  furosemide (LASIX) 40 MG tablet Take 40 mg by mouth 2 (two) times daily. 02/14/20   [provider]  lisinopril (ZESTRIL) 40 MG tablet Take 40 mg by mouth daily. 11/28/19   [provider]  pantoprazole (PROTONIX) 40 MG tablet Take 40 mg by mouth daily. 02/14/20   [provider]  potassium chloride SA (KLOR-CON) 20 MEQ tablet Take 20 mEq by mouth daily. 02/14/20   [provider]  triamcinolone cream (KENALOG) 0.1 % Apply 1 application topically daily. 09/20/19   [provider]    Allergies    Patient has no  allergy information on record.  Review of Systems   Review of Systems  Constitutional: Positive for activity change and fatigue. Negative for chills, diaphoresis and fever.  HENT: Negative.   Eyes: Negative.   Respiratory: Positive for shortness of breath. Negative for cough, choking, chest tightness and wheezing.   Cardiovascular: Negative for chest pain and palpitations.  Gastrointestinal: Negative.  Negative for abdominal pain, diarrhea, nausea and vomiting.  Endocrine: Negative.   Genitourinary: Negative.  Negative for dysuria and hematuria.  Musculoskeletal: Negative.  Negative for arthralgias, back pain, joint swelling, myalgias, neck pain and neck stiffness.  Skin: Negative for rash and wound.   Allergic/Immunologic: Negative.   Neurological: Positive for weakness (Generalized). Negative for dizziness, seizures, syncope, facial asymmetry, numbness and headaches.  Hematological: Bruises/bleeds easily (On Eliquis).  Psychiatric/Behavioral: Negative for confusion and decreased concentration.    Physical Exam Updated Vital Signs BP 96/68   Pulse 94   Temp (!) 96.5 F (35.8 C) (Oral)   Resp 19   Ht 6' (1.829 m)   Wt 114.7 kg   SpO2 97%   BMI 34.29 kg/m   Physical Exam Constitutional:      General: He is sleeping. He is not in acute distress.    Appearance: He is ill-appearing. He is not toxic-appearing or diaphoretic.     Interventions: Nasal cannula in place.  HENT:     Head: Normocephalic and atraumatic.     Right Ear: External ear normal.     Left Ear: External ear normal.     Nose: Nose normal.     Mouth/Throat:     Mouth: Mucous membranes are moist.     Pharynx: Oropharynx is clear.  Eyes:     Extraocular Movements: Extraocular movements intact.     Pupils: Pupils are equal, round, and reactive to light.  Cardiovascular:     Rate and Rhythm: Normal rate. Rhythm irregular.     Pulses: Normal pulses.  Pulmonary:     Effort: No respiratory distress.     Breath sounds: Normal breath sounds. No wheezing.  Abdominal:     General: There is no distension.     Palpations: Abdomen is soft.     Tenderness: There is no abdominal tenderness. There is no guarding.  Musculoskeletal:        General: No tenderness, deformity or signs of injury. Normal range of motion.     Cervical back: Neck supple. No rigidity or tenderness.     Right lower leg: Edema present.     Left lower leg: Edema present.  Skin:    General: Skin is cool and dry.     Coloration: Skin is pale.  Neurological:     Mental Status: He is oriented to person, place, and time and easily aroused.     Cranial Nerves: No cranial nerve deficit.     Sensory: No sensory deficit.     Motor: No weakness.      Coordination: Coordination normal.  Psychiatric:        Mood and Affect: Mood normal.        Behavior: Behavior normal.     ED Results / Procedures / Treatments   Labs (all labs ordered are listed, but only abnormal results are displayed) Labs Reviewed  BASIC METABOLIC PANEL - Abnormal; Notable for the following components:      Result Value   Potassium 5.4 (*)    CO2 21 (*)    Glucose, Bld 137 (*)    BUN 63 (*)  Creatinine, Ser 2.73 (*)    Calcium 8.7 (*)    GFR calc non Af Amer 22 (*)    GFR calc Af Amer 26 (*)    Anion gap 16 (*)    All other components within normal limits  HEPATIC FUNCTION PANEL - Abnormal; Notable for the following components:   Total Protein 5.4 (*)    Albumin 2.5 (*)    AST 108 (*)    ALT 133 (*)    Alkaline Phosphatase 208 (*)    Total Bilirubin 6.2 (*)    Bilirubin, Direct 2.7 (*)    Indirect Bilirubin 3.5 (*)    All other components within normal limits  LACTIC ACID, PLASMA - Abnormal; Notable for the following components:   Lactic Acid, Venous 3.5 (*)    All other components within normal limits  LACTIC ACID, PLASMA - Abnormal; Notable for the following components:   Lactic Acid, Venous 2.9 (*)    All other components within normal limits  URINALYSIS, ROUTINE W REFLEX MICROSCOPIC - Abnormal; Notable for the following components:   Color, Urine AMBER (*)    APPearance HAZY (*)    Hgb urine dipstick SMALL (*)    All other components within normal limits  PROTIME-INR - Abnormal; Notable for the following components:   Prothrombin Time 29.3 (*)    INR 2.9 (*)    All other components within normal limits  CBC WITH DIFFERENTIAL/PLATELET - Abnormal; Notable for the following components:   WBC 11.9 (*)    Platelets 143 (*)    Neutro Abs 11.2 (*)    Lymphs Abs 0.2 (*)    All other components within normal limits  BRAIN NATRIURETIC PEPTIDE - Abnormal; Notable for the following components:   B Natriuretic Peptide 463.6 (*)    All other  components within normal limits  I-STAT CHEM 8, ED - Abnormal; Notable for the following components:   BUN 56 (*)    Creatinine, Ser 2.30 (*)    Glucose, Bld 128 (*)    Calcium, Ion 1.12 (*)    All other components within normal limits  POCT I-STAT EG7 - Abnormal; Notable for the following components:   Calcium, Ion 1.12 (*)    All other components within normal limits  TROPONIN I (HIGH SENSITIVITY) - Abnormal; Notable for the following components:   Troponin I (High Sensitivity) 31 (*)    All other components within normal limits  SARS CORONAVIRUS 2 BY RT PCR (HOSPITAL ORDER, [facility])  CULTURE, BLOOD (ROUTINE X 2)  CULTURE, BLOOD (ROUTINE X 2)  CULTURE, BLOOD (ROUTINE X 2)  CULTURE, BLOOD (ROUTINE X 2)  MRSA PCR SCREENING  MAGNESIUM  CBC WITH DIFFERENTIAL/PLATELET  BLOOD GAS, VENOUS  LACTIC ACID, PLASMA  LACTIC ACID, PLASMA  BRAIN NATRIURETIC PEPTIDE  MAGNESIUM  PHOSPHORUS  COMPREHENSIVE METABOLIC PANEL  CBC WITH DIFFERENTIAL/PLATELET  URINALYSIS, ROUTINE W REFLEX MICROSCOPIC  BLOOD GAS, ARTERIAL  CBC  SAMPLE TO BLOOD BANK  TROPONIN I (HIGH SENSITIVITY)    EKG EKG Interpretation  Date/Time:  Thursday Feb 14 2020 20:41:28 EDT Ventricular Rate:  109 PR Interval:    QRS Duration: 178 QT Interval:  418 QTC Calculation: 563 R Axis:   54 Text Interpretation: Atrial fibrillation Left bundle branch block No previous ECGs available Confirmed by Theotis Burrow [phone]) on 02/14/2020 10:08:20 PM   Radiology CT Head Wo Contrast  Result Date: 02/14/2020 CLINICAL DATA:  Head trauma EXAM: CT HEAD WITHOUT CONTRAST TECHNIQUE: Contiguous  axial images were obtained from the base of the skull through the vertex without intravenous contrast. COMPARISON:  None. FINDINGS: Brain: Mild motion degradation. No acute territorial infarction, hemorrhage or intracranial mass. Mild atrophy. Mild chronic small vessel ischemic change of the white matter. 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 Nonenlarged ventricles Vascular: No hyperdense vessels.  Scattered carotid calcification Skull: Normal. Negative for fracture or focal lesion. Sinuses/Orbits: No acute finding. Other: None IMPRESSION: 1. Mild motion degradation. No definite CT evidence for acute intracranial abnormality. 2. Atrophy and mild chronic small vessel ischemic change of the white matter Electronically Signed   By: Donavan Foil M.D.   On: 02/14/2020 21:48   DG Chest Portable 1 View  Result Date: 02/14/2020 CLINICAL DATA:  Status post fall with subsequent chest pain and shortness of breath. EXAM: PORTABLE CHEST 1 VIEW COMPARISON:  Feb 05, 2020 FINDINGS: Mild hazy infiltrates are seen within the bilateral upper lobes and right lung base. This is increased in severity when compared to the prior study. There is no evidence of a pleural effusion or pneumothorax. The cardiac silhouette is markedly enlarged and unchanged in size. The visualized skeletal structures are unremarkable. IMPRESSION: 1. Mild hazy bilateral upper lobe and right basilar infiltrates. 2. Stable cardiomegaly. Electronically Signed   By: Virgina Norfolk M.D.   On: 02/14/2020 21:22    Procedures Procedures (including critical care time)  Medications Ordered in ED Medications  DOBUTamine (DOBUTREX) infusion 4000 mcg/mL (2.5 mcg/kg/min  122.5 kg Intravenous New Bag/Given 02/15/20 0237)  amiodarone (PACERONE) tablet 200 mg (has no administration in time range)  pantoprazole (PROTONIX) EC tablet 40 mg (has no administration in time range)  apixaban (ELIQUIS) tablet 5 mg (has no administration in time range)  docusate sodium (COLACE) capsule 100 mg (has no administration in time range)  polyethylene glycol (MIRALAX / GLYCOLAX) packet 17 g (has no administration in time range)  ondansetron (ZOFRAN) injection 4 mg (has no administration in time range)  ipratropium-albuterol (DUONEB) 0.5-2.5  (3) MG/3ML nebulizer solution 3 mL (has no administration in time range)  vancomycin (VANCOREADY) IVPB 1250 mg/250 mL (has no administration in time range)  ceFEPIme (MAXIPIME) 2 g in sodium chloride 0.9 % 100 mL IVPB (has no administration in time range)  sodium zirconium cyclosilicate (LOKELMA) packet 10 g (10 g Oral Given 02/15/20 0025)  insulin aspart (novoLOG) injection 5 Units (5 Units Intravenous Given 02/15/20 0025)    And  dextrose 50 % solution 50 mL (50 mLs Intravenous Given 02/15/20 0025)  ceFEPIme (MAXIPIME) 2 g in sodium chloride 0.9 % 100 mL IVPB (0 g Intravenous Stopped 02/15/20 0145)  metroNIDAZOLE (FLAGYL) IVPB 500 mg (0 mg Intravenous Stopped 02/15/20 0145)  vancomycin (VANCOREADY) IVPB 2000 mg/400 mL (2,000 mg Intravenous New Bag/Given 02/15/20 0152)  sodium chloride 0.9 % bolus 500 mL (500 mLs Intravenous New Bag/Given 02/15/20 0252)    ED Course  I have reviewed the triage vital signs and the nursing notes.  Pertinent labs & imaging results that were available during my care of the patient were reviewed by me and considered in my medical decision making (see chart for details).    MDM Rules/Calculators/A&P                      Patient is a 74 year old male who presents for fatigue and generalized weakness.  He was discharged from the hospital yesterday.  Since he has been home, he has mostly slept.  Today, while standing, he suffered a mechanical fall.  He landed on soft surfaces and does not believe that he injured anything.  Due to his fatigue and generalized weakness, he was unable to get up off of the floor.  Given that he is on Eliquis, patient presented as a level 2 trauma.  On arrival, he is alert and oriented.  He denies any acute pain.  Exam notable for significant lower extremity edema, general ill appearance.  Patient is arousable and able to answer questions, however he is quick to close his eyes and fall asleep.  Abdomen is soft and nontender.  He has no midline  tenderness on his back or C-spine.  There are no areas of deformity or tenderness on his extremities.  Vital signs upon arrival were concerning for hypotension and hypothermia.  Broad laboratory work-up was initiated, to include blood cultures.  IV fluids were held, given his severe heart failure.  Patient has not taken his medications today.  Given his hypotension, no Coreg or Lasix was given.  EKG showed A. fib with LBBB, consistent with prior EKGs.  Rate was slightly elevated in the range of 110.  CT of head showed no acute intracranial abnormality.  Chest x-ray showed hazy infiltrates in bilateral upper lobes and right lung base, increased from prior chest x-ray, and stable cardiomegaly.  Laboratory work-up notable for the following:  -Leukocytosis (11.9), baseline thrombocytopenia -AKI with creatinine of 2.73, increased from baseline of 1.5 -Elevated BUN (63) -Potassium elevated at 5.4.  Temporized in the ED with insulin/dextrose and Lokelma -Lactate elevated at 3.5 -Hepatobiliary derangements: Elevated transaminases (108/133), alk phos (208), T bili (6.2), D bili (2.7), elevated INR (2.9)  Patient's vital signs and laboratory work-up are concerning for shock.  Etiologies of shock include cardiogenic and/or infectious.  Cardiology was consulted for guidance on possible cardiogenic shock in setting of severe heart failure.  Critical care was consulted.  Patient was started on broad-spectrum antibiotics for treatment of possible occult infection.  Blood cultures are pending.    At time of signout, patient was awaiting evaluations by cardiology and critical care.  Care of patient was signed out to oncoming ED team.   Final Clinical Impression(s) / ED Diagnoses Final diagnoses:  Fall, initial encounter  Acute on chronic systolic congestive heart failure (HCC)  Fatigue, unspecified type    Rx / DC Orders ED Discharge Orders    None       Godfrey Pick, MD 02/15/20 0615    Rex Kras,  Wenda Overland, MD 02/16/20 1108

## 2020-02-14 NOTE — Progress Notes (Signed)
Orthopedic Tech Progress Note Patient Details:  Bryan Moran 27-Sep-1946 [id number] Level 2 Trauma  Patient ID: Bryan Moran, male   DOB: Jul 01, 1946, 74 y.o.   MRN: [id number]   Bryan Moran 02/14/2020, 8:50 PM

## 2020-02-15 ENCOUNTER — Inpatient Hospital Stay: Payer: Self-pay

## 2020-02-15 ENCOUNTER — Inpatient Hospital Stay (HOSPITAL_COMMUNITY): Payer: BC Managed Care – PPO

## 2020-02-15 ENCOUNTER — Encounter (HOSPITAL_COMMUNITY): Payer: Self-pay | Admitting: Internal Medicine

## 2020-02-15 ENCOUNTER — Other Ambulatory Visit: Payer: Self-pay

## 2020-02-15 DIAGNOSIS — I5023 Acute on chronic systolic (congestive) heart failure: Secondary | ICD-10-CM | POA: Diagnosis not present

## 2020-02-15 DIAGNOSIS — Z20822 Contact with and (suspected) exposure to covid-19: Secondary | ICD-10-CM | POA: Diagnosis present

## 2020-02-15 DIAGNOSIS — I4891 Unspecified atrial fibrillation: Secondary | ICD-10-CM | POA: Diagnosis not present

## 2020-02-15 DIAGNOSIS — Z7189 Other specified counseling: Secondary | ICD-10-CM

## 2020-02-15 DIAGNOSIS — Z515 Encounter for palliative care: Secondary | ICD-10-CM

## 2020-02-15 DIAGNOSIS — R7989 Other specified abnormal findings of blood chemistry: Secondary | ICD-10-CM | POA: Diagnosis not present

## 2020-02-15 DIAGNOSIS — I5043 Acute on chronic combined systolic (congestive) and diastolic (congestive) heart failure: Secondary | ICD-10-CM | POA: Diagnosis present

## 2020-02-15 DIAGNOSIS — N179 Acute kidney failure, unspecified: Secondary | ICD-10-CM | POA: Diagnosis present

## 2020-02-15 DIAGNOSIS — I509 Heart failure, unspecified: Secondary | ICD-10-CM

## 2020-02-15 DIAGNOSIS — I4821 Permanent atrial fibrillation: Secondary | ICD-10-CM | POA: Diagnosis not present

## 2020-02-15 DIAGNOSIS — E875 Hyperkalemia: Secondary | ICD-10-CM | POA: Diagnosis present

## 2020-02-15 DIAGNOSIS — R54 Age-related physical debility: Secondary | ICD-10-CM | POA: Diagnosis present

## 2020-02-15 DIAGNOSIS — E43 Unspecified severe protein-calorie malnutrition: Secondary | ICD-10-CM | POA: Diagnosis present

## 2020-02-15 DIAGNOSIS — R570 Cardiogenic shock: Secondary | ICD-10-CM

## 2020-02-15 DIAGNOSIS — K117 Disturbances of salivary secretion: Secondary | ICD-10-CM | POA: Diagnosis present

## 2020-02-15 DIAGNOSIS — K5900 Constipation, unspecified: Secondary | ICD-10-CM | POA: Diagnosis not present

## 2020-02-15 DIAGNOSIS — I5082 Biventricular heart failure: Secondary | ICD-10-CM | POA: Diagnosis present

## 2020-02-15 DIAGNOSIS — D696 Thrombocytopenia, unspecified: Secondary | ICD-10-CM | POA: Diagnosis present

## 2020-02-15 DIAGNOSIS — I5084 End stage heart failure: Secondary | ICD-10-CM | POA: Diagnosis present

## 2020-02-15 DIAGNOSIS — E872 Acidosis: Secondary | ICD-10-CM | POA: Diagnosis present

## 2020-02-15 DIAGNOSIS — Z66 Do not resuscitate: Secondary | ICD-10-CM | POA: Diagnosis present

## 2020-02-15 DIAGNOSIS — E876 Hypokalemia: Secondary | ICD-10-CM | POA: Diagnosis not present

## 2020-02-15 DIAGNOSIS — N1832 Chronic kidney disease, stage 3b: Secondary | ICD-10-CM | POA: Diagnosis present

## 2020-02-15 DIAGNOSIS — J9621 Acute and chronic respiratory failure with hypoxia: Secondary | ICD-10-CM | POA: Diagnosis present

## 2020-02-15 DIAGNOSIS — I447 Left bundle-branch block, unspecified: Secondary | ICD-10-CM | POA: Diagnosis present

## 2020-02-15 DIAGNOSIS — I130 Hypertensive heart and chronic kidney disease with heart failure and stage 1 through stage 4 chronic kidney disease, or unspecified chronic kidney disease: Secondary | ICD-10-CM | POA: Diagnosis present

## 2020-02-15 DIAGNOSIS — I428 Other cardiomyopathies: Secondary | ICD-10-CM | POA: Diagnosis present

## 2020-02-15 DIAGNOSIS — J9601 Acute respiratory failure with hypoxia: Secondary | ICD-10-CM | POA: Diagnosis not present

## 2020-02-15 DIAGNOSIS — I4819 Other persistent atrial fibrillation: Secondary | ICD-10-CM | POA: Diagnosis present

## 2020-02-15 LAB — COMPREHENSIVE METABOLIC PANEL
ALT: 98 U/L — ABNORMAL HIGH (ref 0–44)
AST: 49 U/L — ABNORMAL HIGH (ref 15–41)
Albumin: 2.1 g/dL — ABNORMAL LOW (ref 3.5–5.0)
Alkaline Phosphatase: 164 U/L — ABNORMAL HIGH (ref 38–126)
Anion gap: 12 (ref 5–15)
BUN: 61 mg/dL — ABNORMAL HIGH (ref 8–23)
CO2: 23 mmol/L (ref 22–32)
Calcium: 7.9 mg/dL — ABNORMAL LOW (ref 8.9–10.3)
Chloride: 104 mmol/L (ref 98–111)
Creatinine, Ser: 2.31 mg/dL — ABNORMAL HIGH (ref 0.61–1.24)
GFR calc Af Amer: 32 mL/min — ABNORMAL LOW (ref >60)
GFR calc non Af Amer: 27 mL/min — ABNORMAL LOW (ref >60)
Glucose, Bld: 125 mg/dL — ABNORMAL HIGH (ref 70–99)
Potassium: 4.1 mmol/L (ref 3.5–5.1)
Sodium: 139 mmol/L (ref 135–145)
Total Bilirubin: 4.4 mg/dL — ABNORMAL HIGH (ref 0.3–1.2)
Total Protein: 4.7 g/dL — ABNORMAL LOW (ref 6.5–8.1)

## 2020-02-15 LAB — GLUCOSE, CAPILLARY: Glucose-Capillary: 105 mg/dL — ABNORMAL HIGH (ref 70–99)

## 2020-02-15 LAB — COOXEMETRY PANEL
Carboxyhemoglobin: 0.9 % (ref 0.5–1.5)
Methemoglobin: 0.3 % (ref 0.0–1.5)
O2 Saturation: 72.4 %
Total hemoglobin: 14.9 g/dL (ref 12.0–16.0)

## 2020-02-15 LAB — CBC WITH DIFFERENTIAL/PLATELET
Abs Immature Granulocytes: 0.05 10*3/uL (ref 0.00–0.07)
Basophils Absolute: 0 10*3/uL (ref 0.0–0.1)
Basophils Relative: 0 %
Eosinophils Absolute: 0 10*3/uL (ref 0.0–0.5)
Eosinophils Relative: 0 %
HCT: 48.6 % (ref 39.0–52.0)
Hemoglobin: 15.4 g/dL (ref 13.0–17.0)
Immature Granulocytes: 0 %
Lymphocytes Relative: 2 %
Lymphs Abs: 0.2 10*3/uL — ABNORMAL LOW (ref 0.7–4.0)
MCH: 31.9 pg (ref 26.0–34.0)
MCHC: 31.7 g/dL (ref 30.0–36.0)
MCV: 100.6 fL — ABNORMAL HIGH (ref 80.0–100.0)
Monocytes Absolute: 0.5 10*3/uL (ref 0.1–1.0)
Monocytes Relative: 4 %
Neutro Abs: 10.7 10*3/uL — ABNORMAL HIGH (ref 1.7–7.7)
Neutrophils Relative %: 94 %
Platelets: 130 10*3/uL — ABNORMAL LOW (ref 150–400)
RBC: 4.83 MIL/uL (ref 4.22–5.81)
RDW: 15 % (ref 11.5–15.5)
WBC: 11.5 10*3/uL — ABNORMAL HIGH (ref 4.0–10.5)
nRBC: 0 % (ref 0.0–0.2)

## 2020-02-15 LAB — TROPONIN I (HIGH SENSITIVITY)
Troponin I (High Sensitivity): 31 ng/L — ABNORMAL HIGH (ref <18)
Troponin I (High Sensitivity): 33 ng/L — ABNORMAL HIGH (ref <18)

## 2020-02-15 LAB — LACTIC ACID, PLASMA
Lactic Acid, Venous: 2.9 mmol/L (ref 0.5–1.9)
Lactic Acid, Venous: 2.2 mmol/L (ref 0.5–1.9)
Lactic Acid, Venous: 2.5 mmol/L (ref 0.5–1.9)

## 2020-02-15 LAB — BRAIN NATRIURETIC PEPTIDE
B Natriuretic Peptide: 463.6 pg/mL — ABNORMAL HIGH (ref 0.0–100.0)
B Natriuretic Peptide: 1408.4 pg/mL — ABNORMAL HIGH (ref 0.0–100.0)

## 2020-02-15 LAB — MAGNESIUM: Magnesium: 2.2 mg/dL (ref 1.7–2.4)

## 2020-02-15 LAB — PHOSPHORUS: Phosphorus: 5.2 mg/dL — ABNORMAL HIGH (ref 2.5–4.6)

## 2020-02-15 LAB — PROCALCITONIN: Procalcitonin: 1.39 ng/mL

## 2020-02-15 LAB — MRSA PCR SCREENING: MRSA by PCR: NEGATIVE

## 2020-02-15 MED ORDER — PANTOPRAZOLE SODIUM 40 MG PO TBEC
40.0000 mg | DELAYED_RELEASE_TABLET | Freq: Every day | ORAL | Status: DC
  Administered 2020-02-15 – 2020-02-17 (×3): 40 mg via ORAL
  Filled 2020-02-15 (×3): qty 1

## 2020-02-15 MED ORDER — AMIODARONE HCL IN DEXTROSE 360-4.14 MG/200ML-% IV SOLN
60.0000 mg/h | INTRAVENOUS | Status: AC
  Administered 2020-02-15: 60 mg/h via INTRAVENOUS
  Filled 2020-02-15 (×2): qty 200

## 2020-02-15 MED ORDER — CHLORHEXIDINE GLUCONATE CLOTH 2 % EX PADS
6.0000 | MEDICATED_PAD | Freq: Every day | CUTANEOUS | Status: DC
  Administered 2020-02-15 – 2020-02-25 (×10): 6 via TOPICAL

## 2020-02-15 MED ORDER — AMIODARONE HCL IN DEXTROSE 360-4.14 MG/200ML-% IV SOLN
30.0000 mg/h | INTRAVENOUS | Status: DC
  Administered 2020-02-16 – 2020-02-17 (×4): 30 mg/h via INTRAVENOUS
  Filled 2020-02-15 (×3): qty 200

## 2020-02-15 MED ORDER — DOCUSATE SODIUM 100 MG PO CAPS
100.0000 mg | ORAL_CAPSULE | Freq: Two times a day (BID) | ORAL | Status: DC | PRN
  Administered 2020-02-19: 100 mg via ORAL
  Filled 2020-02-15: qty 1

## 2020-02-15 MED ORDER — VANCOMYCIN HCL IN DEXTROSE 1-5 GM/200ML-% IV SOLN: 1000.0000 mg | Freq: Once | INTRAVENOUS | Status: DC

## 2020-02-15 MED ORDER — VANCOMYCIN HCL 2000 MG/400ML IV SOLN
2000.0000 mg | Freq: Once | INTRAVENOUS | Status: AC
  Administered 2020-02-15: 2000 mg via INTRAVENOUS
  Filled 2020-02-15: qty 400

## 2020-02-15 MED ORDER — SODIUM CHLORIDE 0.9 % IV SOLN
2.0000 g | Freq: Once | INTRAVENOUS | Status: AC
  Administered 2020-02-15: 2 g via INTRAVENOUS
  Filled 2020-02-15: qty 2

## 2020-02-15 MED ORDER — APIXABAN 5 MG PO TABS
5.0000 mg | ORAL_TABLET | Freq: Two times a day (BID) | ORAL | Status: DC
  Administered 2020-02-15: 5 mg via ORAL
  Filled 2020-02-15: qty 1

## 2020-02-15 MED ORDER — HEPARIN (PORCINE) 25000 UT/250ML-% IV SOLN
1400.0000 [IU]/h | INTRAVENOUS | Status: DC
  Administered 2020-02-15: 1200 [IU]/h via INTRAVENOUS
  Administered 2020-02-16: 1300 [IU]/h via INTRAVENOUS
  Administered 2020-02-17: 1400 [IU]/h via INTRAVENOUS
  Filled 2020-02-15 (×3): qty 250

## 2020-02-15 MED ORDER — FUROSEMIDE 10 MG/ML IJ SOLN
80.0000 mg | Freq: Two times a day (BID) | INTRAMUSCULAR | Status: DC
  Administered 2020-02-15: 80 mg via INTRAVENOUS
  Filled 2020-02-15: qty 8

## 2020-02-15 MED ORDER — SODIUM CHLORIDE 0.9 % IV BOLUS
500.0000 mL | Freq: Once | INTRAVENOUS | Status: AC
  Administered 2020-02-15: 500 mL via INTRAVENOUS

## 2020-02-15 MED ORDER — POLYETHYLENE GLYCOL 3350 17 G PO PACK: 17.0000 g | PACK | Freq: Every day | ORAL | Status: DC | PRN

## 2020-02-15 MED ORDER — SODIUM CHLORIDE 0.9 % IV SOLN
2.0000 g | Freq: Two times a day (BID) | INTRAVENOUS | Status: DC
  Administered 2020-02-15 – 2020-02-17 (×5): 2 g via INTRAVENOUS
  Filled 2020-02-15 (×6): qty 2

## 2020-02-15 MED ORDER — ONDANSETRON HCL 4 MG/2ML IJ SOLN: 4.0000 mg | Freq: Four times a day (QID) | INTRAMUSCULAR | Status: DC | PRN

## 2020-02-15 MED ORDER — DOBUTAMINE IN D5W 4-5 MG/ML-% IV SOLN
5.0000 ug/kg/min | INTRAVENOUS | Status: DC
  Administered 2020-02-15: 2.5 ug/kg/min via INTRAVENOUS
  Administered 2020-02-16: 5 ug/kg/min via INTRAVENOUS
  Filled 2020-02-15 (×2): qty 250

## 2020-02-15 MED ORDER — VANCOMYCIN HCL 1250 MG/250ML IV SOLN
1250.0000 mg | INTRAVENOUS | Status: DC
  Administered 2020-02-16: 1250 mg via INTRAVENOUS
  Filled 2020-02-15: qty 250

## 2020-02-15 MED ORDER — AMIODARONE HCL 200 MG PO TABS
200.0000 mg | ORAL_TABLET | Freq: Every day | ORAL | Status: DC
  Administered 2020-02-15: 200 mg via ORAL
  Filled 2020-02-15: qty 1

## 2020-02-15 MED ORDER — METRONIDAZOLE IN NACL 5-0.79 MG/ML-% IV SOLN
500.0000 mg | Freq: Once | INTRAVENOUS | Status: AC
  Administered 2020-02-15: 500 mg via INTRAVENOUS
  Filled 2020-02-15: qty 100

## 2020-02-15 MED ORDER — IPRATROPIUM-ALBUTEROL 0.5-2.5 (3) MG/3ML IN SOLN: 3.0000 mL | RESPIRATORY_TRACT | Status: DC | PRN

## 2020-02-15 NOTE — Consult Note (Signed)
Cardiology Consultation:   Patient ID: Bryan Moran MRN: [id number]; DOB: 01-09-1946  Admit date: 02/14/2020 Date of Consult: 02/15/2020  Primary Care Provider: Patient, No Pcp Per Primary Cardiologist: Delton See Primary Electrophysiologist:  Allred   Patient Profile:   Bryan Moran is a 74 y.o. male with a hx of chronic systolic CHF with last LVEF at 20-25% per echo 02-06-20 (EF has been as low as 15-20% in 2015), followed by Dr. Johney Frame and refused Biv ICD in the past, chronic LBBB,hypertensive heart disease with CHF, paroxysmal/persistent atrial fibrillation previously controlled by amiodarone and no anticoagulation secondary to GI bleed who is being seen today for the evaluation of AF with RVR and CHF. Pt was just discharged from [facility] on 02-13-20 after being admitted for HF and afib.   History of Present Illness:   Bryan Moran was discharged essentially yesterday, 02-13-20 after a week-long stay at [facility] for HF and afib w/ RVR. It was felt during that hospitalization that an aggressive approach should be taken in regard to the AF given low EF, and that an effort should be made to restore NSR. DCCV was attempted and was initially successful, however pt went back into AF within 24 hours. He had been on amiodarone w/ some success in the past, so he was restarted on amiodarone prior to discharge yesterday. He has not taken any amiodarone since being at home but did appear to get started w/ IV load while at [facility]. He was diuresed w/ lasix 40mg  bid bid while hospitalized but I do not think he had any lasix doses at all today while at home. Notes mention that pt had issues w/ hypotension while in the hospital; he was unable to tolerate ACEi due to low BP, and is on coreg but at low-dose.  Pt came to the ED today after falling at home. He went home from the hospital on Tuesday, and sometime Wednesday had a mechanical fall. He is not sure what time it happened or how long he was down but says it was "several hours at  least." it sounds like he was likely without food, water, meds, and oxygen for the better part of yesterday. At some point he managed to crawl to a phone and call EMS; EMS brought him to the ED for further evaluation. Pt had no complaints of acutely worsening SOB, chest discomfort, edema or any other new complaints other than falling and being unable to get up. Labs showed acute renal failure, abnormal LFTs. Pt has been hypotensive since arrival. He did initially get a 150cc bolus of NS, after which his renal function and BP transiently improved. He has been in AF with HR ~100.   Past Medical History:  Diagnosis Date  . A-fib (HCC)    a. Officially dx 05/27/14, sx may have been going on longer.  . Acute blood loss anemia   . Acute duodenal ulcer with hemorrhage 06/10/2014  . Atrial fibrillation, persistent (HCC)   . Coagulopathy (HCC)   . Dysrhythmia   . Eczema   . Hypertension   . Venous insufficiency          Past Surgical History:  Procedure Laterality Date  . CARDIOVERSION N/A 06/05/2014   Procedure: CARDIOVERSION;  Surgeon: 08/05/2014, MD;  Location: [facility] ENDOSCOPY;  Service: Cardiovascular;  Laterality: N/A;  . ESOPHAGOGASTRODUODENOSCOPY N/A 06/07/2014   Procedure: ESOPHAGOGASTRODUODENOSCOPY (EGD);  Surgeon: 08/07/2014, MD;  Location[facility] ENDOSCOPY;  Service: Endoscopy;  Laterality: N/A;  . ESOPHAGOGASTRODUODENOSCOPY N/A 06/12/2014  Procedure: ESOPHAGOGASTRODUODENOSCOPY (EGD);  Surgeon: Iva Boop, MD;  Location: [facility] ENDOSCOPY;  Service: Endoscopy;  Laterality: N/A;  . HERNIA REPAIR  1993   [facility]  . TEE WITHOUT CARDIOVERSION N/A 06/05/2014   Procedure: TRANSESOPHAGEAL ECHOCARDIOGRAM (TEE);  Surgeon: Quintella Reichert, MD;  Location: [facility] ENDOSCOPY;  Service: Cardiovascular;  Laterality: N/A;      Home Medications:  Prior to Admission medications   Medication Sig Start Date End Date Taking? Authorizing Provider  amiodarone (PACERONE) 200 MG  tablet Take 1 tablet by mouth daily. 02/14/20   [provider]  carvedilol (COREG) 3.125 MG tablet Take 3.125 mg by mouth 2 (two) times daily. 02/14/20   [provider]  ELIQUIS 5 MG TABS tablet Take 5 mg by mouth 2 (two) times daily. 02/14/20   [provider]  furosemide (LASIX) 40 MG tablet Take 40 mg by mouth 2 (two) times daily. 02/14/20   [provider]  lisinopril (ZESTRIL) 40 MG tablet Take 40 mg by mouth daily. 11/28/19   [provider]  pantoprazole (PROTONIX) 40 MG tablet Take 40 mg by mouth daily. 02/14/20   [provider]  potassium chloride SA (KLOR-CON) 20 MEQ tablet Take 20 mEq by mouth daily. 02/14/20   [provider]  triamcinolone cream (KENALOG) 0.1 % Apply 1 application topically daily. 09/20/19   [provider]  **lisinopril is on this list, however it was intended to be stopped at discharge yesterday  Inpatient Medications: Scheduled Meds: . insulin aspart  5 Units Intravenous Once   And  . dextrose  1 ampule Intravenous Once  . sodium zirconium cyclosilicate  10 g Oral Once   Continuous Infusions: . ceFEPime (MAXIPIME) IV    . metronidazole    . vancomycin     PRN Meds:  Allergies:   No Known Allergies  Social History:   Social History        Socioeconomic History  . Marital status: Single    Spouse name: Not on file  . Number of children: Not on file  . Years of education: Not on file  . Highest education level: Not on file  Occupational History  . Not on file  Tobacco Use  . Smoking status: Never Smoker  . Smokeless tobacco: Never Used  Substance and Sexual Activity  . Alcohol use: No  . Drug use: No  . Sexual activity: Not Currently  Other Topics Concern  . Not on file  Social History Narrative  . Not on file   Social Determinants of Health      Financial Resource Strain:   . Difficulty of Paying Living Expenses:   Food Insecurity:   . Worried About Community education officer in the Last Year:   . Barista in the Last Year:   Transportation Needs:   . Freight forwarder (Medical):   Marland Kitchen Lack of Transportation (Non-Medical):   Physical Activity:   . Days of Exercise per Week:   . Minutes of Exercise per Session:   Stress:   . Feeling of Stress :   Social Connections:   . Frequency of Communication with Friends and Family:   . Frequency of Social Gatherings with Friends and Family:   . Attends Religious Services:   . Active Member of Clubs or Organizations:   . Attends Banker Meetings:   Marland Kitchen Marital Status:   Intimate Partner Violence:   . Fear of Current or Ex-Partner:   .  Emotionally Abused:   Marland Kitchen Physically Abused:   . Sexually Abused:     Family History:        Family History  Problem Relation Age of Onset  . Depression Father   . Heart disease Father        Some sort of heart issue at end of life - possibly CAD  . Multiple sclerosis Mother   . Cancer Mother        Tumor in kidney, spread to bones   Family Status:       Family Status  Relation Name Status  . Father  Deceased  . Mother  Deceased  . MGM  Deceased  . MGF  Deceased  . PGM  Deceased  . PGF  Deceased     ROS:  Please see the history of present illness.   All other ROS reviewed and negative.     Physical Exam/Data:   Vitals:   02/14/20 2245 02/14/20 2300 02/14/20 2322 02/14/20 2330  BP: 93/72 (!) 88/66  101/72  Pulse: (!) 101 97  (!) 29  Resp: (!) 22 (!) 22  19  Temp:      TempSrc:      SpO2: 93% 98%  98%  Weight:   122.5 kg   Height:   6' (1.829 m)     Intake/Output Summary (Last 24 hours) at 02/15/2020 0023 Last data filed at 02/14/2020 2323 Gross per 24 hour  Intake 150 ml  Output 150 ml  Net 0 ml   Last 3 Weights 02/14/2020  Weight (lbs) 270 lb  Weight (kg) 122.471 kg     Body mass index is 36.62 kg/m.  General:  Well nourished, well developed, somnolent but arousable and answers questions  appropriately HEENT: normal Lymph: no adenopathy Neck: difficult to eval for JVD Endocrine:  No thryomegaly Vascular: No carotid bruits; DP pulses 1+ bilaterally  Cardiac:  normal S1, S2; irreg irreg, no M/R/G Lungs:  Decreased BS bilaterally Abd: soft, nontender, no hepatomegaly  Ext: 1+ bilateral LE edema Musculoskeletal:  No deformities Skin: warm and dry  Neuro:  no focal abnormalities noted  EKG:  The EKG was personally reviewed and demonstrates:  Afib, LBBB, HR 109 Telemetry:  Telemetry was personally reviewed and demonstrates:  afib w/ HR in low 100s  Relevant CV Studies: TTE 02-06-20 1. Left ventricular ejection fraction, by estimation, is 20 to 25%. The  left ventricle has severely decreased function. The left ventricle  demonstrates global hypokinesis with significant septal dyssynchrony. The  left ventricular internal cavity size  was severely dilated. There is mild concentric left ventricular  hypertrophy.  2. Right ventricular systolic function is moderately reduced. The right  ventricular size is moderately enlarged. There is normal pulmonary artery  systolic pressure. The estimated right ventricular systolic pressure is  26.2 mmHg.  3. Left atrial size was severely dilated.  4. The mitral valve is normal in structure. Mild mitral valve  regurgitation. No evidence of mitral stenosis.  5. The aortic valve is normal in structure. Aortic valve regurgitation is  mild. No aortic stenosis is present.  6. The inferior vena cava is normal in size with greater than 50%  respiratory variability, suggesting right atrial pressure of 3 mmHg.  7. Right atrial size was mildly dilated.   Laboratory Data:  High Sensitivity Troponin:  No results for input(s): TROPONINIHS in the last 720 hours.   Chemistry Recent Labs  Lab 02/14/20 2110 02/14/20 2211  NA 140 138  139  K 5.4* 4.3  4.4  CL 103 102  CO2 21*  --   GLUCOSE 137* 128*  BUN 63* 56*  CREATININE 2.73*  2.30*  CALCIUM 8.7*  --   GFRNONAA 22*  --   GFRAA 26*  --   ANIONGAP 16*  --     Recent Labs  Lab 02/14/20 2110  PROT 5.4*  ALBUMIN 2.5*  AST 108*  ALT 133*  ALKPHOS 208*  BILITOT 6.2*   Hematology Recent Labs  Lab 02/14/20 2211 02/14/20 2225  WBC  --  11.9*  RBC  --  5.10  HGB 16.7  17.0 16.0  HCT 49.0  50.0 49.4  MCV  --  96.9  MCH  --  31.4  MCHC  --  32.4  RDW  --  14.9  PLT  --  143*   BNPNo results for input(s): BNP, PROBNP in the last 168 hours.  DDimer No results for input(s): DDIMER in the last 168 hours.   Radiology/Studies:  CT Head Wo Contrast  Result Date: 02/14/2020 CLINICAL DATA:  Head trauma EXAM: CT HEAD WITHOUT CONTRAST TECHNIQUE: Contiguous axial images were obtained from the base of the skull through the vertex without intravenous contrast. COMPARISON:  None. FINDINGS: Brain: Mild motion degradation. No acute territorial infarction, hemorrhage or intracranial mass. Mild atrophy. Mild chronic small vessel ischemic change of the white matter. 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 Nonenlarged ventricles Vascular: No hyperdense vessels.  Scattered carotid calcification Skull: Normal. Negative for fracture or focal lesion. Sinuses/Orbits: No acute finding. Other: None IMPRESSION: 1. Mild motion degradation. No definite CT evidence for acute intracranial abnormality. 2. Atrophy and mild chronic small vessel ischemic change of the white matter Electronically Signed   By: Donavan Foil M.D.   On: 02/14/2020 21:48   DG Chest Portable 1 View  Result Date: 02/14/2020 CLINICAL DATA:  Status post fall with subsequent chest pain and shortness of breath. EXAM: PORTABLE CHEST 1 VIEW COMPARISON:  Feb 05, 2020 FINDINGS: Mild hazy infiltrates are seen within the bilateral upper lobes and right lung base. This is increased in severity when compared to the prior study. There is no evidence of a pleural effusion or pneumothorax.  The cardiac silhouette is markedly enlarged and unchanged in size. The visualized skeletal structures are unremarkable. IMPRESSION: 1. Mild hazy bilateral upper lobe and right basilar infiltrates. 2. Stable cardiomegaly. Electronically Signed   By: Virgina Norfolk M.D.   On: 02/14/2020 21:22         Assessment and Plan:   1. HFrEF: pt was clinically compensated when discharged on 02-13-20; he had a mechanical fall on 02-14-20 and was found down after lying on the floor w/o food, water, meds or supplemental O2 for at least several hours. He responded favorably to an initial small fluid bolus in the ED. I am going to cautiously give him 1/2NS 57ml bolus and see how he responds. He does have a low LVEF, and there may be a cardiogenic element but I think we can try cautious hydration before adding inotropes. I do not want to be aggressive w/ hydration given reduced LVEF. If we do add inotropes, dobutamine may be the best choice given low BP, although it is likely to make the AF w/ RVR more problematic and difficult to manage. Will have the HF team see him tomorrow and provide further recommendations re: management. I will check back in after the bolus; will recheck labs and BP,  reassess clinically to decide whether or not to add the inotrope. 2. AF: HR in low 100s. He was supposed to be taking amiodarone PO; recommend resuming his discharge dose of amio. There has been talk regarding possible CRT-D; pt might do well w/ AVN ablation and CRT-D ultimately. This was deferred to be done as OP but could possibly be done while he is here this time to expedite. INR is 2.9; would hold eliquis until INR drifts back down.      For questions or updates, please contact [facility] Please consult [email or web address] for contact info under     Signed, Precious Reel, MD, [facility]  02/15/2020 12:23 AM

## 2020-02-15 NOTE — Progress Notes (Signed)
Pharmacy Antibiotic Note  Bryan Moran is a 74 y.o. male admitted on 02/14/2020 with SOB/AMS/possible sepsis.  Pharmacy has been consulted for Vancomycin and Cefepime  Dosing.  Vancomycin 2 g IV given in ED at  0200  Plan: Vancomycin 1250 mg IV q24h Cefepime 2 g IV q12h  Height: 6' (182.9 cm) Weight: 122.5 kg (270 lb) IBW/kg (Calculated) : 77.6  Temp (24hrs), Avg:96.5 F (35.8 C), Min:96.5 F (35.8 C), Max:96.5 F (35.8 C)  Recent Labs  Lab 02/14/20 2110 02/14/20 2201 02/14/20 2211 02/14/20 2225 02/15/20 0011  WBC  --   --   --  11.9*  --   CREATININE 2.73*  --  2.30*  --   --   LATICACIDVEN  --  3.5*  --   --  2.9*    Estimated Creatinine Clearance: 39.3 mL/min (A) (by C-G formula based on SCr of 2.3 mg/dL (H)).    Not on File   Eddie Candle 02/15/2020 2:46 AM

## 2020-02-15 NOTE — Consult Note (Signed)
Palliative Medicine Inpatient Consult Note  Reason for consult:  Goals of care conversations "Severe combined HFrEF/HFpEF with multiple hospitalizations"  HPI:  Per intake H&P --> Bryan Moran a 74 year old Caucasian male with past medical history remarkable for chronic combined systolic/diastolic congestive heart failure last EF 25-30% with previous refusal of ICD, chronic left bundle branch block, essential hypertension, paroxysmal/persistent atrial fibrillation CKD stage III who presented with progressive shortness of breath, weakness.  Palliative care was asked to get involved in the setting of multiple hospitalizations.   Clinical Assessment/Goals of Care: I have reviewed medical records including EPIC notes, labs and imaging, received report from bedside RN, assessed the patient.    I met with Bryan Moran to further discuss diagnosis prognosis, GOC, EOL wishes, disposition and options.   I introduced Palliative Medicine as specialized medical care for people living with serious illness. It focuses on providing relief from the symptoms and stress of a serious illness. The goal is to improve quality of life for both the patient and the family.  I asked Bryan Moran to tell me about himself. He shares that he is from [place] originally. He moved to [place] as his father worked for [employer] at an early age then his family moved to [place] for another employment opportunity. Bryan Moran shares that he has never been married or had children. He has lived along throughout his life. As of presently he does not have illicit habits. He is a member of the [employer]. He gets enjoyment out of spending time with his church friends.  In regard to bADLs, Bryan Moran was fully independent before hospitalization. He vocalizes discontentment over his recent fall in the home.  I asked Bryan Moran to give me a better impression regarding what he knows about his disease. He states that he has a  poorly functioning heart in the setting of Afib and now bad heart failure. He states that he thought he was "doing wonderfully" until recently when he started experiencing more symptoms such as generalized weakness and shortness of breath. He expresses "I knew I had heart trouble, but I did not know how bad it was." We talked about his notes over the years from his cardiologist outlining his heart disease. We talked about the plan for him to meet with the heart failure team to identify if any additional treatments can be offered.   A detailed discussion was had today regarding advanced directives, Bryan Moran says that he does not know if he has filled out this paperwork before.  He states that his primary decision maker would be his sister, Bryan Moran.   Concepts specific to code status, artifical feeding and hydration, continued IV antibiotics and rehospitalization was had.  Bryan Moran says that he "wants to keep fighting." He would want all interventions offered to prolong his life. I shared with Bryan Moran that I would worry about coding him as he would likely endure great trauma and not tremendous benefit given the severity of his underlying heart disease. We talked about this for quite sometime. I asked him what he considered to be a meaningful quality of life. He said the ability to remain autonomous. His personal goals include gaining strength. We talked about the reality that this may require rehabilitation which he is amenable to. He said that he would be willing to have an outpatient Palliative provider follow along with him for the time being. Strongly recommended consideration of DNR given Bryan Moran's overall goals.   I asked Bryan Moran if he would be comfortable  with me calling his sister which he stated would be okay with him. He shares that Bryan Moran plans to come to the hospital tomorrow.  Discussed the importance of continued conversation with family and their  medical providers regarding overall plan of care and  treatment options, ensuring decisions are within the context of the patients values and GOCs. _______________________________________________ Addendum: I called Bryan Moran in the early evening. She shared with me that she has not seen Bryan Moran since 2015. She vocalized herself and her husband suffering from a variety of chronic diseases which have precluded her ability to visit. She states that she does talk to Bryan Moran daily. Bryan Moran expresses that Bryan Moran is likely in denial about the severity of his disease. She said that two years ago Bryan Moran had self discontinued his amiodarone "just because" which likely caused many more problems. Bryan Moran shares that she worries Bryan Moran will never be able to live by himself again. She feels that he will take some time to process his present condition. Bryan Moran plans to visit tomorrow for further discussion.   Decision Maker: Patient can make decisions for himself.  SUMMARY OF RECOMMENDATIONS   Full code and Full scope of treatment for the time being, plan to complete a MOST this hospitalization  Family meeting tomorrow at 11AM to further discuss code status and personal goals  TOC --> OP Palliative Care  PT/OT --> Patient is open to rehabilitation  Code Status/Advance Care Planning: FULL CODE for the time being   Symptom Management:  Muscular Weakness:                 - Physical Therapy Evaluation                 - Occupational Therapy Evaluation   Xerostomia:                 - Good oral care QShift                 - Encourage liquid intake   Spiritual:                 - Chaplain consult   Palliative Prophylaxis:   Oral Care, Constipation management  Additional Recommendations (Limitations, Scope, Preferences): Continue current plan of care   Psycho-social/Spiritual:   Desire for further Chaplaincy support: Yes  Additional Recommendations: Education on Palliative Care   Prognosis: Poor in the setting of progressive heart failure  Discharge  Planning: Will depend on progress, however he would be open to OP Palliative care.  Review of Systems  Constitutional: Positive for malaise/fatigue.  Respiratory: Positive for shortness of breath.   Cardiovascular: Positive for palpitations.  Gastrointestinal: Positive for constipation.   Vitals with BMI 02/15/2020 02/15/2020 02/15/2020  Height - - -  Weight - - -  BMI - - -  Systolic 101 110 91  Diastolic 75 83 71  Pulse 110 53 52   Physical Exam  Constitutional: He is oriented to person, place, and time and well-developed, well-nourished, and in no distress.  HENT:  Head: Normocephalic.  Eyes: Pupils are equal, round, and reactive to light.  Cardiovascular: An irregularly irregular rhythm present.  Abdominal: Normal appearance.  Musculoskeletal:     Right shoulder: Decreased strength.  Neurological: He is oriented to person, place, and time.  Skin: Skin is dry.   PPS: 40%    This conversation/these recommendations were discussed with patient primary care team, Dr. Smith  Time In: 1530 Time Out: 1730 Total Time: 120 Greater than   50%  of this time was spent counseling and coordinating care related to the above assessment and plan.  [place] Team Team Cell Phone: [phone] Please utilize secure chat with additional questions, if there is no response within 30 minutes please call the above phone number  Palliative Medicine Team providers are available by phone from 7am to 7pm daily and can be reached through the team cell phone.  Should this patient require assistance outside of these hours, please call the patient's attending physician.

## 2020-02-15 NOTE — Progress Notes (Signed)
  Amiodarone Drug - Drug Interaction Consult Note  Recommendations: No medication changes needed at this time   Amiodarone is metabolized by the cytochrome P450 system and therefore has the potential to cause many drug interactions. Amiodarone has an average plasma half-life of 50 days (range 20 to 100 days).   There is potential for drug interactions to occur several weeks or months after stopping treatment and the onset of drug interactions may be slow after initiating amiodarone.   []  Statins: Increased risk of myopathy. Simvastatin- restrict dose to 20mg  daily. Other statins: counsel patients to report any muscle pain or weakness immediately.  []  Anticoagulants: Amiodarone can increase anticoagulant effect. Consider warfarin dose reduction. Patients should be monitored closely and the dose of anticoagulant altered accordingly, remembering that amiodarone levels take several weeks to stabilize.  []  Antiepileptics: Amiodarone can increase plasma concentration of phenytoin, the dose should be reduced. Note that small changes in phenytoin dose can result in large changes in levels. Monitor patient and counsel on signs of toxicity.  []  Beta blockers: increased risk of bradycardia, AV block and myocardial depression. Sotalol - avoid concomitant use.  []   Calcium channel blockers (diltiazem and verapamil): increased risk of bradycardia, AV block and myocardial depression.  []   Cyclosporine: Amiodarone increases levels of cyclosporine. Reduced dose of cyclosporine is recommended.  []  Digoxin dose should be halved when amiodarone is started.  []  Diuretics: increased risk of cardiotoxicity if hypokalemia occurs.  []  Oral hypoglycemic agents (glyburide, glipizide, glimepiride): increased risk of hypoglycemia. Patient's glucose levels should be monitored closely when initiating amiodarone therapy.   []  Drugs that prolong the QT interval:  Torsades de pointes risk may be increased with concurrent  use - avoid if possible.  Monitor QTc, also keep magnesium/potassium WNL if concurrent therapy can't be avoided. Antibiotics: e.g. fluoroquinolones, erythromycin. . Antiarrhythmics: e.g. quinidine, procainamide, disopyramide, sotalol. . Antipsychotics: e.g. phenothiazines, haloperidol.  . Lithium, tricyclic antidepressants, and methadone.   Pharm.D. CPP, BCPS Clinical Pharmacist [phone] 02/15/2020 5:07 PM

## 2020-02-15 NOTE — H&P (Signed)
NAME:  Copelan Maultsby, MRN:  [id number], DOB:  Jan 22, 1946, LOS: 0 ADMISSION DATE:  02/14/2020, CONSULTATION DATE:  02/15/20 REFERRING MD:  Dr. Fernanda Drum, CHIEF COMPLAINT:  Hypotension   Brief History   Cliff Damiani is a 74 year old Caucasian male with past medical history remarkable for chronic combined systolic/diastolic congestive heart failure last EF 25-30% with previous refusal of ICD, chronic left bundle branch block, essential hypertension, paroxysmal/persistent atrial fibrillation CKD stage III who presented with progressive shortness of breath, weakness.  History of present illness   Mr. Jah Alarid is a 74y M w PMHx of ICM, combined HFrEF and HFpEF NYHA Class IV, chronic LBBB, persistent AF on Eliquis, CKD III presenting for shortness of breath and fatigue following mechanical fall. Pt was recently hospitalized from 5/11-5/19 at [facility] for ADHF and AF RVR. TTE in that hospitalization showed LVEF 20-25%, global hypokinesis. He was given Lasix 40mg  bid, lisinopril and spiro were discontinued d/t hypotension. Underwent DCCV on 5/14 but reverted to AF 24 hours later. Pt was ultimately compensated, started on Eliquis and discharged on 2L oxygen.   He reports he felt severely fatigued at home. He got up to pay some bills and lost his balance while standing. He fell backwards onto his bed and then onto the floor; he did not hurt anything or hit his head during the fall. However, he felt too weak to stand back up. He was able to crawl to a phone and call EMS.  In the ED, pt p/w waxing and waning mental status. He was hypotensive to 90s/70s and T35.8. HR 100. He was agitated and insisted that he "just wanted to sleep". He denied any chest pain, chills, cough, diarrhea, vomiting, or dysuria. Labs notable for WBC 11.9, INR 1.9, Lactate 3.5, elevated LFTs. U/A unremarkable and BC drawn. CT head non acute. CXR showed hasy bilateral UL infiltrates. EKG unchanged and pro-BNP 460, trop 31. He was started on Vanc, cefepime,  and Flagyl and admitted to [facility].   Past Medical History  Combined HFpEF/HFrEF NYHAA Class IV CKD III Persistent AF  Chronic LBBB   Significant Hospital Events   none  Consults:  Cardiology Hospitalist > refused admission  Procedures:  None  Significant Diagnostic Tests:  Lactate 3.3 > 2.9 > 2.2  Micro Data:  COVID negative BC (5/20) NGTD  U/a unremarkable   Antimicrobials:  Cefepime 5/20 > pharmacy consult ordered Vanc 5/20 > pharmacy consult ordered  Metronidazole 5/20 for one dose    Interim history/subjective:  NAE. VSS. BP improved with Dobutamine (started given suboptimal response to 500cc 1/2 NS). WBC stable at 11. T36.5, HR 80s, SpO2 100 on 4LNC. Pt alert and oriented this am, though still somnolent. Converses fluently and answers questions appropriately. Lactate downtrending.    Objective   Blood pressure 93/62, pulse 89, temperature 97.7 F (36.5 C), resp. rate 18, height 6' (1.829 m), weight 114.7 kg, SpO2 98 %.        Intake/Output Summary (Last 24 hours) at 02/15/2020 0829 Last data filed at 02/15/2020 0600 Gross per 24 hour  Intake 765.89 ml  Output 150 ml  Net 615.89 ml   Filed Weights   02/14/20 2322 02/15/20 0500  Weight: 122.5 kg 114.7 kg    Examination: Gen: Well appearing in NAD. A&O x3. Answers questions appropriately. Somnolent HEENT: atraumatic, normocephalic, sclera anicteric, EOMI, PERRLA, MMM.   Neck: no cervical lymphadenopathy, no JVD  Heart: Distant heart sounds, irregular rhythm, S1, S2, no M/R/G, no chest wall tenderness Lungs: CTAB,  no crackles or wheezes, normal work of breathing Abdomen: Normoactive bowel sounds, soft, NTND, no rebound/guarding, no hepatosplenomegaly Extremities: no clubbing, cyanosis. Trace dependent edema to ankles bilaterally; pulses are +2 in bilateral upper and lower extremities Neuro: CN II-XI grossly intact, normal cerebellar function, normal gait. No focal deficits. Skin:  No rashes, lesions Psych:  Normal mood.    Resolved Hospital Problem list   None   Assessment & Plan:   74 yo M h/o HFpEF/HFrEF, CKDIII, persistent AF recently discharged on 5/19 presents with SOB and fatigue following a mechanical fall.   Shock:  Favor Cardiogenic over septic. Lactate downtrending 3.5 > 2.2. BP not responsive to gentle 500 cc bolus (x2), but has improved with Dobutamine; stable without pressors at this point. Pt with long h/o combined HFpEF/HFrEF, most recent EF 20-25%. Pro-BNP [id number]. Still has leukocytosis without clear source of infection; normothermic at present.  - Cards consulted and following, appreciate  - Continue Dobutamine drip  - BC NGTD, U/a unremarkable - Continue Vanc + Cefepime  - Procalcitonin   Acute Hypoxic Respiratory Failure: Pt discharged on 2L now doing well on the same. Clinically euvolemic, likely 2/2 shock state as above. CXR unimpressive for infection. Pt was recently started on amiodarone one week ago for DCCV however duration short for amio-induced lung tox. Back in AF.  -cont abx as above - duoneb  -nasal cannula goal sats 92%  Combined HFpEF/HFrEF: LVEF 20-25%, likely contributory to shock as above. ACEi and spiro stopped last hospitalization. Clinically euvolemic though proBNP [id number]. Trop stable in 25s. Pt relatively somnolent and reports not taking medicines at home; in the setting of multiple hospitalizations for end stage combined HFpEF/HFrEF, will consult palliative.  - Cards consulted and following, appreciate - Hold home Coreg and Lasix given soft pressures - Amio  - Consult palliative care   Persistent AF: s/p DCCV 5/14 however returned to AF 24 hours later. Began amiodarone and Eliquis during that hospitalization.  - Continue Amio - Continue Eliquis   Elevated LFTs: AST 108, ALT 133, ALP 208 on presentation, likely related to shock state as above. Now downtrending.  - NTD   CKD III: Baseline Cr around  - Monitor I/o - Avoid nephrotoxic meds as  possible   H/o Peptic Ulcer disease:  - Continue home PPI   Best practice:  Diet: NPO Pain/Anxiety/Delirium protocol (if indicated): None VAP protocol (if indicated): None DVT prophylaxis: Eliquis  GI prophylaxis: home PPI Glucose control: none Mobility: bedrest Code Status: Full Code Family Communication: called sister at number in chart ([phone]) no answer Disposition: admit to ICU    Ivin Poot, MS4

## 2020-02-15 NOTE — Progress Notes (Signed)
ANTICOAGULATION CONSULT NOTE - Initial Consult  Pharmacy Consult for Heparin - while apixaban on hold Indication: atrial fibrillation  Not on File  Patient Measurements: Height: 6' (182.9 cm) Weight: 114.7 kg (252 lb 13.9 oz) IBW/kg (Calculated) : 77.6 Heparin Dosing Weight: 104 kg  Vital Signs: Temp: 96.8 F (36 C) (05/21 1528) Temp Source: Axillary (05/21 1528) BP: 106/75 (05/21 2000) Pulse Rate: 51 (05/21 2000)  Labs: Recent Labs    02/14/20 2110 02/14/20 2211 02/14/20 2211 02/14/20 2225 02/15/20 0602  HGB  --  16.7  17.0   < > 16.0 15.4  HCT  --  49.0  50.0  --  49.4 48.6  PLT  --   --   --  143* 130*  LABPROT 29.3*  --   --   --   --   INR 2.9*  --   --   --   --   CREATININE 2.73* 2.30*  --   --  2.31*  TROPONINIHS 31*  --   --   --  33*   < > = values in this interval not displayed.    Estimated Creatinine Clearance: 37.8 mL/min (A) (by C-G formula based on SCr of 2.31 mg/dL (H)).   Medical History: Past Medical History:  Diagnosis Date  . A-fib (HCC)   . CHF (congestive heart failure) (HCC)   . Hypertension      Assessment: 73yom with HF EF 20% and Afib recent DCCV but now back in afib - start amiodarone drip.  Patient is acutely ill in ICU.  Will hold apixaban (LD 5/21 am) and cover with heparin drip until more stable and procedures complete.   Use aptt to dose heparin while apixaban affecting heparin level.  Goal of Therapy:  Heparin level 0.3-0.7 units/ml Monitor platelets by anticoagulation protocol: Yes   Plan:  Hold apixaban Heparin drip 1200 uts/hr Daily aptt/heparin level/cbc Monitor s/s bleeding    Leota Sauers Pharm.D. CPP, BCPS Clinical Pharmacist [phone] 02/15/2020 8:28 PM

## 2020-02-15 NOTE — ED Provider Notes (Signed)
12:20 AM  Just discharged for CHF exacerbation (EF 25%) - see MRN [id number].  Had a fall today on Eliquis.  Came in as level II trauma.  Here patient is in a fib with RVR here (110s) and low BP.  Has AKI, mild hyperkalemia that has improved.  Elevated lactic.  LFTs elevated.  Was hypotensive.  Concern for shock liver and shock kidney.  BP slowly improving.  Not on pressors at this time.  No chest pain, has mild SOB.  CXR shows bilateral infiltrates.  On 4L [place].  Getting IV abx.  Patient is a full code.  Cardiology to see patient for concerns for possible decompensated HF.  CCM to see patient as well as hospitalist deferred admission at this time.  Critical care has seen patient and will admit.  Getting IV dobutamine infusion per cardiology.  I reviewed all nursing notes and pertinent previous records as available.  I have reviewed and interpreted any EKGs, lab and urine results, imaging (as available).   CRITICAL CARE Performed by: Rochele Raring   Total critical care time: 35 minutes  Critical care time was exclusive of separately billable procedures and treating other patients.  Critical care was necessary to treat or prevent imminent or life-threatening deterioration.  Critical care was time spent personally by me on the following activities: development of treatment plan with patient and/or surrogate as well as nursing, discussions with consultants, evaluation of patient's response to treatment, examination of patient, obtaining history from patient or surrogate, ordering and performing treatments and interventions, ordering and review of laboratory studies, ordering and review of radiographic studies, pulse oximetry and re-evaluation of patient's condition.    Ovella Manygoats, Layla Maw, DO 02/15/20 [phone]

## 2020-02-15 NOTE — Progress Notes (Addendum)
02/15/20  Seen and examined in f/u for shock. -Denies pain, cough, SOB. -Remains weak. -On exam, ext lukewarm, 1-2+ pitting edema in legs, neck veins up  A: -Shock- looks more cardiogenic to me; BNP up, persistent elevated lactate -Abnormal CXR- patchy airspace disease a little worse on this admission but overall similar to 10 days ago.  Question aspiration, needs f/u imaging -Acute on chronic kidney injury -Acute on chronic cholestasis of unclear etiology -On amiodarone started recently, this preceded airspace disease, would be atypical for pulmonary toxicity.  P: -Await repeat echo, CHF team input, probably would benefit from RHC -Continue dobutamine, watch UoP, Cr -IS, flutter, Up to chair, PT/OT/SLP eval -Broad spectrum abx okay for now, check Pct -RUQ [place] - Needs f/u CXR around 6 weeks from now  35 minutes critical care time not including any separately billable procedures     NAME:  Bryan Moran, MRN:  [id number], DOB:  [birth year], LOS: 0 ADMISSION DATE:  02/14/2020, CONSULTATION DATE:  02/15/20 REFERRING MD:  Dr. Fernanda Drum, CHIEF COMPLAINT:  Hypotension   Brief History   Bryan Moran is a 73 year old Caucasian male with past medical history remarkable for chronic combined systolic/diastolic congestive heart failure last EF 25-30% with previous refusal of ICD, chronic left bundle branch block, essential hypertension, paroxysmal/persistent atrial fibrillation CKD stage III who presented with progressive shortness of breath, weakness.  History of present illness   16y M w PMHx of ICM, sCHF NYHAA Class IV, chronic LBBB, pAF on AC, CKD III presenting for shortness of breath and fatigue. Patient evaluated in the ER and noted to be hypotnesive, given 1L bolus. Patient is poor historian, aggravated and yelling "let me sleep I'm fine" then doses back off, history obtained from chart review. Patient adamantly refuses any symptoms, however uncertain of baseline mental status.   Past Medical  History  sCHF NYHAA Class IV CKD pAF  Significant Hospital Events   none  Consults:  Cardiology Hospitalist > refused admission  Procedures:  None  Significant Diagnostic Tests:  Lactate 3.3 > 2.9  Micro Data:  COVID negative Cultures sent  Cefepime 5/20  Antimicrobials:  Cefepime 5/20 > pharmacy consult ordered Vanc consult ordered   Interim history/subjective:  Pt. States he is feeling better. CXR 5/20 with  hazy bilateral upper lobe/ right basilar infiltrates.Increased increased since prior study. Stable cardiomegaly  Objective   Blood pressure 93/62, pulse 89, temperature 97.7 F (36.5 C), resp. rate 18, height 6' (1.829 m), weight 114.7 kg, SpO2 98 %.        Intake/Output Summary (Last 24 hours) at 02/15/2020 7628 Last data filed at 02/15/2020 0600 Gross per 24 hour  Intake 765.89 ml  Output 150 ml  Net 615.89 ml   Filed Weights   02/14/20 2322 02/15/20 0500  Weight: 122.5 kg 114.7 kg    Examination: General: Elderly male, resting, easily arousable, in NAD on 4 L [place] HENT:NCAT,  PERRL dry MM, No LAD Lungs: Bilateral chest excursion, Few rhonchi which clear with cough, diminished per bases R>L Cardiovascular: S1, S2, HR 90, irregular , A fib per tele Abdomen: soft, nontender non distended, BS +, Body mass index is 34.29 kg/m. Extremities: trace LE edema, sequential compression hose in place, no obvious deformity,  Neuro: A&O x 2  no focal deficits , calm and  moving all extremities, appropriate GU: deferred  Resolved Hospital Problem list   None   Assessment & Plan:  Concern for Cardiogenic vs. Septic Shock' -blood cultures pending -continue broad spectrum abx -  cardiology consult >> appreciate cards input - continue  dobutmaine 2.5mg   -cont amiodarone PO -repeat ECHO>> pending -trend troponins -BNP  Acute Hypoxic Respiratory Failure  -VAP vs. Volume, COVID negative -cont abx as above - Aggressive Pulmonary Toilet - IS Q 1 while awake -  OOB to chair, PT/ OT -gentle fluid resuscitation as above -Monitor I/O, place foley -sputum culture -nasal cannula goal sats 92% -consier amiodarone induced lung toxicity although timeline is short  Acute on Chronic vs. Chronic Decompensated Heart Failure Hx Chronic systolic CHFwithlast LVEF at20-25% ( 01/2020) Elevated Troponin's Elevated BNP Atrial Fibrillation -hold coreg -hold diuretics given hypotension -hold ACE given hypotension -cardiology consult -cont amiodarone for now - consider Co-Ox if needs CVC   Chronic AF -cont amio -hold coreg -hold lasix -cont eliquis home dose - Bleeding precautions  Hypocalcemia Corrects with albumin to  Trend Calcium  Hx of CKD Elevated Creatinine on admission -monitor I/O - trend BMET - Monitor UO -avoid renal toxic meds  Elevated LFT's, Bili, Lactate Jaundice Plan RUQ [place] Trend LFT's Trend lactate  Hx of PUD -home PPI  Best practice:  Diet: NPO Pain/Anxiety/Delirium protocol (if indicated): None VAP protocol (if indicated): None DVT prophylaxis: eliquis GI prophylaxis: home PPI Glucose control: none Mobility: bedrest Code Status: Full Code Family Communication: called sister at number in chart ([phone]) no answer Disposition: admit to ICU  Labs   CBC: Recent Labs  Lab 02/14/20 2211 02/14/20 2225 02/15/20 0602  WBC  --  11.9* 11.5*  NEUTROABS  --  11.2* 10.7*  HGB 16.7  17.0 16.0 15.4  HCT 49.0  50.0 49.4 48.6  MCV  --  96.9 100.6*  PLT  --  143* 130*    Basic Metabolic Panel: Recent Labs  Lab 02/14/20 2110 02/14/20 2211 02/15/20 0602  NA 140 138  139 139  K 5.4* 4.3  4.4 4.1  CL 103 102 104  CO2 21*  --  23  GLUCOSE 137* 128* 125*  BUN 63* 56* 61*  CREATININE 2.73* 2.30* 2.31*  CALCIUM 8.7*  --  7.9*  MG 2.2  --  2.2  PHOS  --   --  5.2*   GFR: Estimated Creatinine Clearance: 37.8 mL/min (A) (by C-G formula based on SCr of 2.31 mg/dL (H)). Recent Labs  Lab 02/14/20 2201  02/14/20 2225 02/15/20 0011 02/15/20 0602  WBC  --  11.9*  --  11.5*  LATICACIDVEN 3.5*  --  2.9* 2.2*    Liver Function Tests: Recent Labs  Lab 02/14/20 2110 02/15/20 0602  AST 108* 49*  ALT 133* 98*  ALKPHOS 208* 164*  BILITOT 6.2* 4.4*  PROT 5.4* 4.7*  ALBUMIN 2.5* 2.1*   No results for input(s): LIPASE, AMYLASE in the last 168 hours. No results for input(s): AMMONIA in the last 168 hours.  ABG    Component Value Date/Time   HCO3 25.7 02/14/2020 2211   TCO2 25 02/14/2020 2211   TCO2 27 02/14/2020 2211   ACIDBASEDEF 1.0 02/14/2020 2211   O2SAT 66.0 02/14/2020 2211     Coagulation Profile: Recent Labs  Lab 02/14/20 2110  INR 2.9*    Cardiac Enzymes: No results for input(s): CKTOTAL, CKMB, CKMBINDEX, TROPONINI in the last 168 hours.  HbA1C: No results found for: HGBA1C  CBG: No results for input(s): GLUCAP in the last 168 hours.   Past Medical History  He,  has a past medical history of A-fib (HCC), CHF (congestive heart failure) (HCC), and Hypertension.   Surgical History  History reviewed. No pertinent surgical history.   Social History      Family History   His family history is not on file.   Allergies Not on File   Home Medications  Prior to Admission medications   Medication Sig Start Date End Date Taking? Authorizing Provider  amiodarone (PACERONE) 200 MG tablet Take 1 tablet by mouth daily. 02/14/20   [provider]  carvedilol (COREG) 3.125 MG tablet Take 3.125 mg by mouth 2 (two) times daily. 02/14/20   [provider]  ELIQUIS 5 MG TABS tablet Take 5 mg by mouth 2 (two) times daily. 02/14/20   [provider]  furosemide (LASIX) 40 MG tablet Take 40 mg by mouth 2 (two) times daily. 02/14/20   [provider]  lisinopril (ZESTRIL) 40 MG tablet Take 40 mg by mouth daily. 11/28/19   [provider]  pantoprazole (PROTONIX) 40 MG tablet Take 40 mg by mouth daily. 02/14/20   [provider]   potassium chloride SA (KLOR-CON) 20 MEQ tablet Take 20 mEq by mouth daily. 02/14/20   [provider]  triamcinolone cream (KENALOG) 0.1 % Apply 1 application topically daily. 09/20/19   [provider]    Magdalen Spatz, MSN, AGACNP-BC [place] Please consult  Amion for assignments and pager numbers After 4 pm please call [phone] 02/15/2020 8:23 AM

## 2020-02-15 NOTE — Progress Notes (Addendum)
Advanced Heart Failure Team Rounding Note   Primary Physician: Patient, No Pcp Per PCP-Cardiologist:  No primary care provider on file.   HPI:    Bryan Moran is seen today for evaluation of heart failure at the request of Dr Daphine Deutscher.   Bryan Moran is 74 year old with a history of chronic systolic heart failure, LBBB, HTN, A fib, no anticoagulation with GI bleed.   He has been followed by Dr. Delton See for his cardiology care (as well as Dr. Johney Frame) and was last seen in follow up 07/27/2018. Initial echocardiogram from 05/2014 showed an EF of 15-20% at which time he was managed with guideline directed therapy. Repeat echo 11/2014 with mild improvement in function at 25-30% however he was referred to EP for possible BiV ICD placement which he subsequently refused. Systolic dysfunction felt to be tachycardia medicated due to atrial fibrillation. He initially was on Entresto however he had issues with hypotension therefore this was discontinued. He was continued on lisinopril and carvedilol. At last follow up, he was maintaining NSR. He was initially anticoagulated with Eliquis at AF dx however experienced a terrible GI bleed requiring multiple transfusions. He has since been cleared per GI to resume NOAC but the patient declines despite increased risk of stroke. It appears that he was asking about the discontinuation of his amiodarone 200mg  pO QD during an OV in 2019 but this was felt to be necessary given his tachycardia medicated LV dysfunction and deferral of ICD placement. He later self discontinued therapy around 11/2017. He has not been seen in follow up since 06/2018.  Admitted 02/05/2020 with A/C systolic HF and A fib RVR.  Placed on amio drip and had DC-CV was performed with restoration of NSR  for ~ 24 hours. He went back in A fib with plans to consider cardioversion as an outpatient.  Also mention of CRT-D  Diuresed with IV lasix and transitioned to lasix 40 mg twice a day. Discharged on 02/13/20. He was  unable to obtain his medications. Says medications were not delivered and he lives alone.   ECHO 01/2020 EF 20-25% RV moderately reduced.   Yesterday he [facility] to [facility] with after a fall. He is not sure what happened or how long he down. He called 911. Presented via EMS with  increased shortness of breath and fatigue. Hypotensive in EDd and was given IV fluids. Cardiology consulted earlier today. Lactic Acid 3.5 on admit. HS Trop 31. Creatinine 2.7 on admit.  Started on dobutamine 2.5 mcg.   Remains SOB with exertion.    Review of Systems: [y] = yes, [ ]  = no   . General: Weight gain [ ] ; Weight loss [ ] ; Anorexia [ ] ; Fatigue [ Y]; Fever [ ] ; Chills [ ] ; Weakness [Y ]  . Cardiac: Chest pain/pressure [ ] ; Resting SOB [ ] ; Exertional SOB [Y ]; Orthopnea [ ] ; Pedal Edema [Y ]; Palpitations [ ] ; Syncope [ ] ; Presyncope [ ] ; Paroxysmal nocturnal dyspnea[ ]   . Pulmonary: Cough [ ] ; Wheezing[ ] ; Hemoptysis[ ] ; Sputum [ ] ; Snoring [ ]   . GI: Vomiting[ ] ; Dysphagia[ ] ; Melena[ ] ; Hematochezia [ ] ; Heartburn[ ] ; Abdominal pain [ ] ; Constipation [ ] ; Diarrhea [ ] ; BRBPR [ ]   . GU: Hematuria[ ] ; Dysuria [ ] ; Nocturia[ ]   . Vascular: Pain in legs with walking [ ] ; Pain in feet with lying flat [ ] ; Non-healing sores [ ] ; Stroke [ ] ; TIA [ ] ; Slurred speech [ ] ;  .  Neuro: Headaches[ ] ; Vertigo[ ] ; Seizures[ ] ; Paresthesias[ ] ;Blurred vision [ ] ; Diplopia [ ] ; Vision changes [ ]   . Ortho/Skin: Arthritis [ ] ; Joint pain [Y ]; Muscle pain [ ] ; Joint swelling [ ] ; Back Pain [Y]; Rash [ ]   . Psych: Depression[ ] ; Anxiety[ ]   . Heme: Bleeding problems [ ] ; Clotting disorders [ ] ; Anemia [ ]   . Endocrine: Diabetes [ ] ; Thyroid dysfunction[ ]   Home Medications Prior to Admission medications   Medication Sig Start Date End Date Taking? Authorizing Provider  amiodarone (PACERONE) 200 MG tablet Take 1 tablet by mouth daily. 02/14/20   [provider]  carvedilol (COREG) 3.125 MG tablet Take 3.125 mg by  mouth 2 (two) times daily. 02/14/20   [provider]  ELIQUIS 5 MG TABS tablet Take 5 mg by mouth 2 (two) times daily. 02/14/20   [provider]  furosemide (LASIX) 40 MG tablet Take 40 mg by mouth 2 (two) times daily. 02/14/20   [provider]  lisinopril (ZESTRIL) 40 MG tablet Take 40 mg by mouth daily. 11/28/19   [provider]  pantoprazole (PROTONIX) 40 MG tablet Take 40 mg by mouth daily. 02/14/20   [provider]  potassium chloride SA (KLOR-CON) 20 MEQ tablet Take 20 mEq by mouth daily. 02/14/20   [provider]  triamcinolone cream (KENALOG) 0.1 % Apply 1 application topically daily. 09/20/19   [provider]    Past Medical History: Past Medical History:  Diagnosis Date  . A-fib ([place])   . CHF (congestive heart failure) ([place])   . Hypertension     Past Surgical History: History reviewed. No pertinent surgical history.  Family History: History reviewed. No pertinent family history.  Social History: Social History   Socioeconomic History  . Marital status: Single    Spouse name: Not on file  . Number of children: Not on file  . Years of education: Not on file  . Highest education level: Not on file  Occupational History  . Not on file  Tobacco Use  . Smoking status: Not on file  Substance and Sexual Activity  . Alcohol use: Not on file  . Drug use: Not on file  . Sexual activity: Not on file  Other Topics Concern  . Not on file  Social History Narrative  . Not on file   Social Determinants of Health   Financial Resource Strain:   . Difficulty of Paying Living Expenses:   Food Insecurity:   . Worried About Charity fundraiser in the Last Year:   . Arboriculturist in the Last Year:   Transportation Needs:   . Film/video editor (Medical):   Marland Kitchen Lack of Transportation (Non-Medical):   Physical Activity:   . Days of Exercise per Week:   . Minutes of Exercise per Session:   Stress:   . Feeling  of Stress :   Social Connections:   . Frequency of Communication with Friends and Family:   . Frequency of Social Gatherings with Friends and Family:   . Attends Religious Services:   . Active Member of Clubs or Organizations:   . Attends Archivist Meetings:   Marland Kitchen Marital Status:     Allergies:  Not on File  Objective:    Vital Signs:   Temp:  [96.5 F (35.8 C)-97.7 F (36.5 C)] 97.7 F (36.5 C) (05/21 0752) Pulse Rate:  [25-110] 66 (05/21 0900) Resp:  [13-27] 17 (05/21 0900) BP: (  85-103)/(61-79) 96/69 (05/21 0900) SpO2:  [82 %-100 %] 99 % (05/21 0900) Weight:  [114.7 kg-122.5 kg] 114.7 kg (05/21 0500) Last BM Date: 02/14/20  Weight change: Filed Weights   02/14/20 2322 02/15/20 0500  Weight: 122.5 kg 114.7 kg    Intake/Output:   Intake/Output Summary (Last 24 hours) at 02/15/2020 0917 Last data filed at 02/15/2020 0900 Gross per 24 hour  Intake 779.66 ml  Output 150 ml  Net 629.66 ml      Physical Exam    General:  Appears chronically ill. No resp difficulty HEENT: normal Neck: supple. JVP to jaw . Carotids 2+ bilat; no bruits. No lymphadenopathy or thyromegaly appreciated. Cor: PMI nondisplaced. Irregular lar rate & rhythm. No rubs, gallops or murmurs. Lungs: clear Abdomen: soft, nontender, nondistended. No hepatosplenomegaly. No bruits or masses. Good bowel sounds. Extremities: no cyanosis, clubbing, rash, R and LLE 2-3+ edema Neuro: alert & orientedx3, cranial nerves grossly intact. moves all 4 extremities w/o difficulty. Affect pleasant   Telemetry    Afib RVR   EKG   A fib 109  LBBB QRS 172 ms    Labs   Basic Metabolic Panel: Recent Labs  Lab 02/14/20 2109-01-28 02/14/20 2211 02/15/20 0602  NA 140 138  139 139  K 5.4* 4.3  4.4 4.1  CL 103 102 104  CO2 21*  --  23  GLUCOSE 137* 128* 125*  BUN 63* 56* 61*  CREATININE 2.73* 2.30* 2.31*  CALCIUM 8.7*  --  7.9*  MG 2.2  --  2.2  PHOS  --   --  5.2*    Liver Function  Tests: Recent Labs  Lab 02/14/20 01/28/09 02/15/20 0602  AST 108* 49*  ALT 133* 98*  ALKPHOS 208* 164*  BILITOT 6.2* 4.4*  PROT 5.4* 4.7*  ALBUMIN 2.5* 2.1*   No results for input(s): LIPASE, AMYLASE in the last 168 hours. No results for input(s): AMMONIA in the last 168 hours.  CBC: Recent Labs  Lab 02/14/20 01-28-2210 02/14/20 2225 02/15/20 0602  WBC  --  11.9* 11.5*  NEUTROABS  --  11.2* 10.7*  HGB 16.7  17.0 16.0 15.4  HCT 49.0  50.0 49.4 48.6  MCV  --  96.9 100.6*  PLT  --  143* 130*    Cardiac Enzymes: No results for input(s): CKTOTAL, CKMB, CKMBINDEX, TROPONINI in the last 168 hours.  BNP: BNP (last 3 results) Recent Labs    02/14/20 0021 02/15/20 0602  BNP 463.6* 1,408.4*    ProBNP (last 3 results) No results for input(s): PROBNP in the last 8760 hours.   CBG: No results for input(s): GLUCAP in the last 168 hours.  Coagulation Studies: Recent Labs    02/14/20 01/28/2109  LABPROT 29.3*  INR 2.9*     Imaging   CT Head Wo Contrast  Result Date: 02/14/2020 CLINICAL DATA:  Head trauma EXAM: CT HEAD WITHOUT CONTRAST TECHNIQUE: Contiguous axial images were obtained from the base of the skull through the vertex without intravenous contrast. COMPARISON:  None. FINDINGS: Brain: Mild motion degradation. No acute territorial infarction, hemorrhage or intracranial mass. Mild atrophy. Mild chronic small vessel ischemic change of the white matter. 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 Nonenlarged ventricles Vascular: No hyperdense vessels.  Scattered carotid calcification Skull: Normal. Negative for fracture or focal lesion. Sinuses/Orbits: No acute finding. Other: None IMPRESSION: 1. Mild motion degradation. No definite CT evidence for acute intracranial abnormality. 2. Atrophy and mild chronic small vessel ischemic  change of the white matter Electronically Signed   By: Jasmine Pang M.D.   On: 02/14/2020 21:48   DG Chest  Portable 1 View  Result Date: 02/14/2020 CLINICAL DATA:  Status post fall with subsequent chest pain and shortness of breath. EXAM: PORTABLE CHEST 1 VIEW COMPARISON:  Feb 05, 2020 FINDINGS: Mild hazy infiltrates are seen within the bilateral upper lobes and right lung base. This is increased in severity when compared to the prior study. There is no evidence of a pleural effusion or pneumothorax. The cardiac silhouette is markedly enlarged and unchanged in size. The visualized skeletal structures are unremarkable. IMPRESSION: 1. Mild hazy bilateral upper lobe and right basilar infiltrates. 2. Stable cardiomegaly. Electronically Signed   By: Aram Candela M.D.   On: 02/14/2020 21:22      Medications:     Current Medications: . amiodarone  200 mg Oral Daily  . apixaban  5 mg Oral BID  . pantoprazole  40 mg Oral Daily     Infusions: . ceFEPime (MAXIPIME) IV    . DOBUTamine 2.5 mcg/kg/min (02/15/20 0900)  . [START ON 02/16/2020] vancomycin         Patient Profile  Bryan Moran a 74 y.o.malewith a hx of chronic systolic CHFwithlast LVEF at20-25%perecho 02-06-20 (EF has been as low as 15-20% in 2015), followed by Dr. Johney Frame and refused Biv ICD in the past,chronicLBBB,hypertensive heart disease with CHF, paroxysmal/persistentatrial fibrillation previouslycontrolled by amiodarone and no anticoagulation secondary to GI bleedwho is being seen today for the evaluation ofAF with RVR and CHF.   Pt was [facility] on [facility]13[facility]20 after being admitted for HF and afib. Readmitted with A/C systolic HF and AFib RVR.   Assessment/Plan   1. Shock, suspect Cardiogenic - ECHO 01/2020 EF 20-25% . 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  - Lactic Acid 3.5  Blood CX pending. - Started on vanc, cefepime and flagyl -Hypotensive on admit. BNP 1408. Appears Cold and wet. Started on dobutamine but pressures remains low. Increase dobutamine to 5 mcg.  -Needs central access to assess  CO-OX /CVP. - Marked volume overload. Start lasix 80 mg IV lasix twice a day. - No bb or arb with elevated creatinine.   2. A/C Hypoxic Respiratory Failure -CXR with R basilar infiltrates. On antibiotics per CCM.  -Sats stable on 3 liters -Diurese with IV lasix.   3. A Fib RVR -Had cardioversion 02/08/20 with brief conversion to NSR. He later went back in A fib.  - Start amiodarone drip  - Hold eliquis  - Start heparin.   4.  AKI -Most recent creatinine 1.48 on 02/12/20  -Creatinine on admit 2.7. Started on dobutamine and creatinine has come down to 2.3  5  LBBB  In the past he refused CRT-D   6. Elevated LFTs  7. Elevated Bilirubin.   Needs Palliative Care Consult for goals of care.   Medication concerns reviewed with patient and pharmacy team. Barriers identified: Need to make he has meds prior to discharge. Lives alone and has meds to delivered to his house.   Length of Stay: 0  Tonye Becket, NP  02/15/2020, 9:17 AM  Advanced Heart Failure Team Pager [phone] (M-F; 7a - 4p)  Please contact [facility] for night-coverage after hours (4p -7a ) and weekends on [email or web address]  .Agree with above  74 y/o male with long h/o of systolic HF due to presumed NICM. Ahs been followed by Drs. Nelson and Allred. Back in 2016 had decompensated  HF due to AF with RVR. However maintained NSR for > 9 months with no improvement and thus felt to have LBBB CM. Offerer CRT-D but refused to have any devices. Did well for several years,  Admitted earlier this month with recurrent severe HF in setting of recurrent AF. EF 20% with severe biventricular HF. Underwent DC-Cv but did not hold NSR.   Returned home but did very poorly. Unable to care for himself. Had a fall. Readmitted with cardiogenic shock, lactic acidosis and AKI.   Empirically started on dobutamine  On exam General:  Weak terminally ill-appearing HEENT: normal Neck: supple. JVP 5 Carotids 2+ bilat; no bruits. No lymphadenopathy or  thryomegaly appreciated. Cor: PMI nondisplaced. Irr tachy +s3 Lungs: clear Abdomen: soft, nontender, nondistended. No hepatosplenomegaly. No bruits or masses. Good bowel sounds. Extremities: no cyanosis, clubbing, rash, 2-3+ edema Neuro: alert & orientedx3, cranial nerves grossly intact. moves all 4 extremities w/o difficulty. Affect pleasant  He is terminally ill with end-stage HF and MSOF. I had a long talk with him and he feels that he has deteriorated to the point where he can no longer take care of himself.   We discussed the options of comfort care/Hopsice versus a brief trial of aggressive care. For now he would like a brief trial of aggressive care with inotropic support and potentially another attempt at [facility]. He would not want intubation or CPR.   I discussed with his sister by phone who agrees, She will be coming tomorrow to meet with him and Palliative Care. She is concerned that he doesn't have a will and woul like to get this doen while in the hospital, if possible. She will be here around [employer].   CRITICAL CARE Performed by: [name]  Total critical care time: 45 minutes  Critical care time was exclusive of separately billable procedures and treating other patients.  Critical care was necessary to treat or prevent imminent or life-threatening deterioration.  Critical care was time spent personally by me (independent of midlevel providers or residents) on the following activities: development of treatment plan with patient and/or surrogate as well as nursing, discussions with consultants, evaluation of patient's response to treatment, examination of patient, obtaining history from patient or surrogate, ordering and performing treatments and interventions, ordering and review of laboratory studies, ordering and review of radiographic studies, pulse oximetry and re-evaluation of patient's condition.  [name], MD  6:56 PM

## 2020-02-15 NOTE — CV Procedure (Signed)
Central Venous Catheter Insertion Procedure Note Bryan Moran [id number] November 04, 1945    Procedure: Insertion of Central Venous Catheter Indications: Shock   Procedure Details Consent: Risks of procedure as well as the alternatives and risks of each were explained to the (patient/caregiver).  Consent for procedure obtained. Time Out: Verified patient identification, verified procedure, site/side was marked, verified correct patient position, special equipment/implants available, medications/allergies/relevent history reviewed, required imaging and test results available.  Performed   Maximum sterile technique was used including antiseptics, cap, gloves, gown, hand hygiene, mask and sheet. Skin prep: Chlorhexidine; local anesthetic administered A antimicrobial bonded/coated triple lumen catheter was placed in the Right internal jugular vein using the Seldinger technique and u/s guidance.   Evaluation Blood flow good Complications: No apparent complications Patient did tolerate procedure well. Chest X-ray ordered to verify placement.  CXR: ok   Bryan Meres, MD  6:49 PM

## 2020-02-15 NOTE — H&P (Signed)
NAME:  Bryan Moran, MRN:  [id number], DOB:  [birth year], LOS: 0 ADMISSION DATE:  02/14/2020, CONSULTATION DATE:  02/15/20 REFERRING MD:  Dr. Fernanda Drum, CHIEF COMPLAINT:  Hypotension   Brief History   Ehan Freas is a 74 year old Caucasian male with past medical history remarkable for chronic combined systolic/diastolic congestive heart failure last EF 25-30% with previous refusal of ICD, chronic left bundle branch block, essential hypertension, paroxysmal/persistent atrial fibrillation CKD stage III who presented with progressive shortness of breath, weakness.  History of present illness   58y M w PMHx of ICM, sCHF NYHAA Class IV, chronic LBBB, pAF on AC, CKD III presenting for shortness of breath and fatigue. Patient evaluated in the ER and noted to be hypotnesive, given 1L bolus. Patient is poor historian, aggravated and yelling "let me sleep I'm fine" then doses back off, history obtained from chart review. Patient adamantly refuses any symptoms, however uncertain of baseline mental status.   Past Medical History  sCHF NYHAA Class IV CKD pAF  Significant Hospital Events   none  Consults:  Cardiology Hospitalist > refused admission  Procedures:  None  Significant Diagnostic Tests:  Lactate 3.3 > 2.9  Micro Data:  COVID negative Cultures sent  Cefepime 5/20  Antimicrobials:  Cefepime 5/20 > pharmacy consult ordered Vacn consult ordered   Interim history/subjective:  None   Objective   Blood pressure 96/78, pulse (!) 108, temperature (!) 96.5 F (35.8 C), temperature source Oral, resp. rate (!) 27, height 6' (1.829 m), weight 122.5 kg, SpO2 97 %.        Intake/Output Summary (Last 24 hours) at 02/15/2020 0058 Last data filed at 02/14/2020 2323 Gross per 24 hour  Intake 150 ml  Output 150 ml  Net 0 ml   Filed Weights   02/14/20 2322  Weight: 122.5 kg    Examination: General: Drowsy but arousable HENT: PERRL dry MM Lungs: CTAB  Cardiovascular: tachy irregular   Abdomen: soft, nontender nondistended Extremities: trace LE edeam Neuro: Aox1 no focal deficits but agitated, moving all extremities GU: deferred  Resolved Hospital Problem list   None   Assessment & Plan:  Concern for Cardiogenic vs. Septic Shock' -blood cultures -broad spectrum abx -cardiology consult  -repeat 500cc bolus -start dobutmaine 2.5mg   -cont amiodarone PO -repeat ECHO -trend troponins -BNP  Acute Hypoxic Respiratory Failure  -VAP vs. Volume, COVID negative -cont abx as above -gentle fluid resuscitation as above -Monitor I/O, place foley -sputum culture -nasal cannula goal sats 92% -consier amiodarone induced lung toxicity although timeline is short  Acute on Chronic vs. Chronic Decompensated Heart Failure -hold coreg -hold diuretics given hypotension -hold ACE given hypotension -cardiology consult -cont amiodaroen  Chronic AF -cont amio -hold coreg -hold lasix -cont eliquis home dose  Hx of CKD -monitor I/O -avoid renal toxic meds  Hx of PUD -home PPI  Best practice:  Diet: NPO Pain/Anxiety/Delirium protocol (if indicated): None VAP protocol (if indicated): None DVT prophylaxis: eliquis GI prophylaxis: home PPI Glucose control: none Mobility: bedrest Code Status: Full Code Family Communication: called sister at number in chart ([phone]) no answer Disposition: admit to ICU  Labs   CBC: Recent Labs  Lab 02/14/20 2211 02/14/20 2225  WBC  --  11.9*  NEUTROABS  --  11.2*  HGB 16.7  17.0 16.0  HCT 49.0  50.0 49.4  MCV  --  96.9  PLT  --  143*    Basic Metabolic Panel: Recent Labs  Lab 02/14/20 2110 02/14/20 2211  NA 140  138  139  K 5.4* 4.3  4.4  CL 103 102  CO2 21*  --   GLUCOSE 137* 128*  BUN 63* 56*  CREATININE 2.73* 2.30*  CALCIUM 8.7*  --   MG 2.2  --    GFR: Estimated Creatinine Clearance: 39.3 mL/min (A) (by C-G formula based on SCr of 2.3 mg/dL (H)). Recent Labs  Lab 02/14/20 2201 02/14/20 2225   WBC  --  11.9*  LATICACIDVEN 3.5*  --     Liver Function Tests: Recent Labs  Lab 02/14/20 2110  AST 108*  ALT 133*  ALKPHOS 208*  BILITOT 6.2*  PROT 5.4*  ALBUMIN 2.5*   No results for input(s): LIPASE, AMYLASE in the last 168 hours. No results for input(s): AMMONIA in the last 168 hours.  ABG    Component Value Date/Time   HCO3 25.7 02/14/2020 2211   TCO2 25 02/14/2020 2211   TCO2 27 02/14/2020 2211   ACIDBASEDEF 1.0 02/14/2020 2211   O2SAT 66.0 02/14/2020 2211     Coagulation Profile: Recent Labs  Lab 02/14/20 2110  INR 2.9*    Cardiac Enzymes: No results for input(s): CKTOTAL, CKMB, CKMBINDEX, TROPONINI in the last 168 hours.  HbA1C: No results found for: HGBA1C  CBG: No results for input(s): GLUCAP in the last 168 hours.  Review of Systems:   Unable to obtain given patients mental status  Past Medical History  He,  has a past medical history of A-fib ([place]), CHF (congestive heart failure) ([place]), and Hypertension.   Surgical History   History reviewed. No pertinent surgical history.   Social History      Family History   His family history is not on file.   Allergies Not on File   Home Medications  Prior to Admission medications   Medication Sig Start Date End Date Taking? Authorizing Provider  amiodarone (PACERONE) 200 MG tablet Take 1 tablet by mouth daily. 02/14/20   [provider]  carvedilol (COREG) 3.125 MG tablet Take 3.125 mg by mouth 2 (two) times daily. 02/14/20   [provider]  ELIQUIS 5 MG TABS tablet Take 5 mg by mouth 2 (two) times daily. 02/14/20   [provider]  furosemide (LASIX) 40 MG tablet Take 40 mg by mouth 2 (two) times daily. 02/14/20   [provider]  lisinopril (ZESTRIL) 40 MG tablet Take 40 mg by mouth daily. 11/28/19   [provider]  pantoprazole (PROTONIX) 40 MG tablet Take 40 mg by mouth daily. 02/14/20   [provider]  potassium chloride SA (KLOR-CON) 20  MEQ tablet Take 20 mEq by mouth daily. 02/14/20   [provider]  triamcinolone cream (KENALOG) 0.1 % Apply 1 application topically daily. 09/20/19   [provider]     Critical care time: This patient is critically ill with multiple organ system failure; which, requires frequent high complexity decision making, assessment, support, evaluation, and titration of therapies. This was completed through the application of advanced monitoring technologies and extensive interpretation of multiple databases. During this encounter critical care time was devoted to patient care services described in this note for 30 minutes.

## 2020-02-16 ENCOUNTER — Inpatient Hospital Stay (HOSPITAL_COMMUNITY): Payer: BC Managed Care – PPO

## 2020-02-16 LAB — COMPREHENSIVE METABOLIC PANEL
ALT: 64 U/L — ABNORMAL HIGH (ref 0–44)
AST: 17 U/L (ref 15–41)
Albumin: 1.8 g/dL — ABNORMAL LOW (ref 3.5–5.0)
Alkaline Phosphatase: 131 U/L — ABNORMAL HIGH (ref 38–126)
Anion gap: 11 (ref 5–15)
BUN: 53 mg/dL — ABNORMAL HIGH (ref 8–23)
CO2: 25 mmol/L (ref 22–32)
Calcium: 7.8 mg/dL — ABNORMAL LOW (ref 8.9–10.3)
Chloride: 103 mmol/L (ref 98–111)
Creatinine, Ser: 1.77 mg/dL — ABNORMAL HIGH (ref 0.61–1.24)
GFR calc Af Amer: 44 mL/min — ABNORMAL LOW (ref >60)
GFR calc non Af Amer: 38 mL/min — ABNORMAL LOW (ref >60)
Glucose, Bld: 144 mg/dL — ABNORMAL HIGH (ref 70–99)
Potassium: 3.5 mmol/L (ref 3.5–5.1)
Sodium: 139 mmol/L (ref 135–145)
Total Bilirubin: 2.9 mg/dL — ABNORMAL HIGH (ref 0.3–1.2)
Total Protein: 4.5 g/dL — ABNORMAL LOW (ref 6.5–8.1)

## 2020-02-16 LAB — CBC
HCT: 43.2 % (ref 39.0–52.0)
Hemoglobin: 14.3 g/dL (ref 13.0–17.0)
MCH: 31.8 pg (ref 26.0–34.0)
MCHC: 33.1 g/dL (ref 30.0–36.0)
MCV: 96 fL (ref 80.0–100.0)
Platelets: 123 10*3/uL — ABNORMAL LOW (ref 150–400)
RBC: 4.5 MIL/uL (ref 4.22–5.81)
RDW: 15 % (ref 11.5–15.5)
WBC: 9.9 10*3/uL (ref 4.0–10.5)
nRBC: 0 % (ref 0.0–0.2)

## 2020-02-16 LAB — TROPONIN I (HIGH SENSITIVITY)
Troponin I (High Sensitivity): 33 ng/L — ABNORMAL HIGH (ref <18)
Troponin I (High Sensitivity): 33 ng/L — ABNORMAL HIGH (ref <18)

## 2020-02-16 LAB — BASIC METABOLIC PANEL
Anion gap: 10 (ref 5–15)
BUN: 51 mg/dL — ABNORMAL HIGH (ref 8–23)
CO2: 27 mmol/L (ref 22–32)
Calcium: 7.6 mg/dL — ABNORMAL LOW (ref 8.9–10.3)
Chloride: 101 mmol/L (ref 98–111)
Creatinine, Ser: 1.68 mg/dL — ABNORMAL HIGH (ref 0.61–1.24)
GFR calc Af Amer: 46 mL/min — ABNORMAL LOW (ref >60)
GFR calc non Af Amer: 40 mL/min — ABNORMAL LOW (ref >60)
Glucose, Bld: 142 mg/dL — ABNORMAL HIGH (ref 70–99)
Potassium: 3 mmol/L — ABNORMAL LOW (ref 3.5–5.1)
Sodium: 138 mmol/L (ref 135–145)

## 2020-02-16 LAB — BRAIN NATRIURETIC PEPTIDE: B Natriuretic Peptide: 2798 pg/mL — ABNORMAL HIGH (ref 0.0–100.0)

## 2020-02-16 LAB — PROCALCITONIN: Procalcitonin: 1.2 ng/mL

## 2020-02-16 LAB — APTT
aPTT: 52 seconds — ABNORMAL HIGH (ref 24–36)
aPTT: 81 seconds — ABNORMAL HIGH (ref 24–36)

## 2020-02-16 LAB — CALCIUM, IONIZED: Calcium, Ionized, Serum: 4.7 mg/dL (ref 4.5–5.6)

## 2020-02-16 LAB — HEPARIN LEVEL (UNFRACTIONATED): Heparin Unfractionated: 1.66 IU/mL — ABNORMAL HIGH (ref 0.30–0.70)

## 2020-02-16 MED ORDER — SODIUM CHLORIDE 0.9% FLUSH: 10.0000 mL | INTRAVENOUS | Status: DC | PRN

## 2020-02-16 MED ORDER — METOLAZONE 2.5 MG PO TABS
2.5000 mg | ORAL_TABLET | Freq: Once | ORAL | Status: AC
  Administered 2020-02-16: 2.5 mg via ORAL
  Filled 2020-02-16: qty 1

## 2020-02-16 MED ORDER — POTASSIUM CHLORIDE 10 MEQ/50ML IV SOLN
10.0000 meq | INTRAVENOUS | Status: AC
  Administered 2020-02-16 – 2020-02-17 (×6): 10 meq via INTRAVENOUS
  Filled 2020-02-16 (×7): qty 50

## 2020-02-16 MED ORDER — POTASSIUM CHLORIDE CRYS ER 20 MEQ PO TBCR
40.0000 meq | EXTENDED_RELEASE_TABLET | Freq: Once | ORAL | Status: AC
  Administered 2020-02-16: 40 meq via ORAL
  Filled 2020-02-16: qty 2

## 2020-02-16 MED ORDER — SODIUM CHLORIDE 0.9% FLUSH: 10.0000 mL | Freq: Two times a day (BID) | INTRAVENOUS | Status: DC

## 2020-02-16 MED ORDER — ENSURE ENLIVE PO LIQD
237.0000 mL | Freq: Two times a day (BID) | ORAL | Status: DC
  Administered 2020-02-18 – 2020-02-25 (×10): 237 mL via ORAL

## 2020-02-16 MED ORDER — FUROSEMIDE 10 MG/ML IJ SOLN
100.0000 mg | Freq: Once | INTRAVENOUS | Status: AC
  Administered 2020-02-16: 100 mg via INTRAVENOUS
  Filled 2020-02-16: qty 10

## 2020-02-16 MED ORDER — FUROSEMIDE 10 MG/ML IJ SOLN
8.0000 mg/h | INTRAVENOUS | Status: DC
  Administered 2020-02-16 – 2020-02-17 (×2): 8 mg/h via INTRAVENOUS
  Filled 2020-02-16: qty 21
  Filled 2020-02-16: qty 25

## 2020-02-16 NOTE — Plan of Care (Signed)
  Problem: Education: Goal: Knowledge of General Education information will improve Description: Including pain rating scale, medication(s)/side effects and non-pharmacologic comfort measures Outcome: Progressing   Problem: Clinical Measurements: Goal: Respiratory complications will improve Outcome: Progressing   Problem: Elimination: Goal: Will not experience complications related to urinary retention Outcome: Progressing   Problem: Safety: Goal: Ability to remain free from injury will improve Outcome: Progressing   

## 2020-02-16 NOTE — Evaluation (Signed)
Physical Therapy Evaluation Patient Details Name: Bryan Moran MRN: [id number] DOB: [birth year] Today's Date: 02/16/2020   History of Present Illness  74 year old Caucasian male with past medical history remarkable for chronic combined systolic/diastolic congestive heart failure last EF 25-30% with previous refusal of ICD, chronic left bundle branch block, essential hypertension, paroxysmal/persistent atrial fibrillation CKD stage III who presented with progressive shortness of breath, weakness. Pt was recently hospitalized from 5/11-5/19 at [facility] for ADHF and AF RVR.  Dx cardiogenic shock, acute hypoxic resp failure.   Clinical Impression  Pt admitted with/for progressive SOB and weakness.  Pt needing min to light moderate assist for basic mobility.  Pt currently limited functionally due to the problems listed. ( See problems list.)   Pt will benefit from PT to maximize function and safety in order to get ready for next venue listed below.     Follow Up Recommendations CIR    Equipment Recommendations  Other (comment)(TBA)    Recommendations for Other Services Rehab consult     Precautions / Restrictions Precautions Precautions: Fall      Mobility  Bed Mobility Overal bed mobility: Needs Assistance Bed Mobility: Supine to Sit;Sit to Supine     Supine to sit: Max assist;+2 for safety/equipment Sit to supine: Mod assist;+2 for physical assistance   General bed mobility comments: cues for direction, assist up and forward via L elbow.  Pt able to assist move once he had UE's positioned well enough to assist pushing forward.  Transfers Overall transfer level: Needs assistance Equipment used: Rolling walker (2 wheeled) Transfers: Sit to/from Stand Sit to Stand: Min assist;+2 physical assistance         General transfer comment: cues for hand placement and assist to come forward and boost.  Ambulation/Gait             General Gait Details: NT  Stairs             Wheelchair Mobility    Modified Rankin (Stroke Patients Only)       Balance Overall balance assessment: Needs assistance Sitting-balance support: Single extremity supported;No upper extremity supported Sitting balance-Leahy Scale: Fair       Standing balance-Leahy Scale: Poor Standing balance comment: stood in the RW and pt was stabilized while he w/shifted then unweighted and took small steps in place each foot.                             Pertinent Vitals/Pain Pain Assessment: Faces Faces Pain Scale: No hurt Pain Intervention(s): Monitored during session    Home Living Family/patient expects to be discharged to:: Private residence Living Arrangements: Alone   Type of Home: House Home Access: Stairs to enter Entrance Stairs-Rails: Doctor, general practice of Steps: several Home Layout: One level Home Equipment: Environmental consultant - 2 wheels;Cane - single point      Prior Function Level of Independence: Independent;Independent with assistive device(s)               Hand Dominance        Extremity/Trunk Assessment   Upper Extremity Assessment Upper Extremity Assessment: Defer to OT evaluation    Lower Extremity Assessment Lower Extremity Assessment: Generalized weakness       Communication   Communication: No difficulties  Cognition Arousal/Alertness: Awake/alert Behavior During Therapy: Flat affect Overall Cognitive Status: Within Functional Limits for tasks assessed  General Comments      Exercises     Assessment/Plan    PT Assessment Patient needs continued PT services  PT Problem List Decreased strength;Decreased activity tolerance;Decreased balance;Decreased mobility;Decreased coordination;Decreased knowledge of use of DME;Cardiopulmonary status limiting activity       PT Treatment Interventions DME instruction;Gait training;Stair training;Functional mobility  training;Therapeutic activities;Balance training;Patient/family education    PT Goals (Current goals can be found in the Care Plan section)  Acute Rehab PT Goals Patient Stated Goal: I want to be able to do what I want to do. PT Goal Formulation: With patient Time For Goal Achievement: 03/01/20 Potential to Achieve Goals: Good    Frequency Min 3X/week   Barriers to discharge        Co-evaluation               AM-PAC PT "6 Clicks" Mobility  Outcome Measure Help needed turning from your back to your side while in a flat bed without using bedrails?: A Little Help needed moving from lying on your back to sitting on the side of a flat bed without using bedrails?: A Lot Help needed moving to and from a bed to a chair (including a wheelchair)?: A Little Help needed standing up from a chair using your arms (e.g., wheelchair or bedside chair)?: A Little Help needed to walk in hospital room?: A Lot Help needed climbing 3-5 steps with a railing? : A Lot 6 Click Score: 15    End of Session   Activity Tolerance: Patient tolerated treatment well Patient left: in bed;with call bell/phone within reach;with SCD's reapplied Nurse Communication: Mobility status PT Visit Diagnosis: Other abnormalities of gait and mobility (R26.89);Muscle weakness (generalized) (M62.81);Unsteadiness on feet (R26.81)    Time: 2119-4174 PT Time Calculation (min) (ACUTE ONLY): 26 min   Charges:   PT Evaluation $PT Eval Moderate Complexity: 1 Mod          02/16/2020  Ginger Carne., PT Acute Rehabilitation Services [phone]  (pager) [phone]  (office)  Tessie Fass Iosefa Weintraub 02/16/2020, 1:27 PM

## 2020-02-16 NOTE — Progress Notes (Signed)
ANTICOAGULATION CONSULT NOTE  Pharmacy Consult for Heparin - while apixaban on hold Indication: atrial fibrillation  Not on File  Patient Measurements: Height: 6' (182.9 cm) Weight: 112.7 kg (248 lb 7.3 oz) IBW/kg (Calculated) : 77.6 Heparin Dosing Weight: 104 kg  Vital Signs: Temp: 97.8 F (36.6 C) (05/22 1535) Temp Source: Axillary (05/22 1535) BP: 108/71 (05/22 1200) Pulse Rate: 113 (05/22 1200)  Labs: Recent Labs    02/14/20 2110 02/14/20 2110 02/14/20 2211 02/14/20 2211 02/14/20 2225 02/14/20 2225 02/15/20 0602 02/16/20 0316 02/16/20 0852 02/16/20 1645  HGB  --   --  16.7  17.0   < > 16.0   < > 15.4 14.3  --   --   HCT  --   --  49.0  50.0   < > 49.4  --  48.6 43.2  --   --   PLT  --   --   --   --  143*  --  130* 123*  --   --   APTT  --   --   --   --   --   --   --  52*  --  81*  LABPROT 29.3*  --   --   --   --   --   --   --   --   --   INR 2.9*  --   --   --   --   --   --   --   --   --   HEPARINUNFRC  --   --   --   --   --   --   --  1.66*  --   --   CREATININE 2.73*   < > 2.30*  --   --   --  2.31* 1.77*  --   --   TROPONINIHS 31*  --   --   --   --    < > 33* 33* 33*  --    < > = values in this interval not displayed.    Estimated Creatinine Clearance: 48.9 mL/min (A) (by C-G formula based on SCr of 1.77 mg/dL (H)).  Assessment: 73yom with HF EF 20% and Afib recent DCCV but now back in afib - start amiodarone drip.  Patient is acutely ill in ICU.  Will hold apixaban (LD 5/21 am) and cover with heparin drip until more stable and procedures complete.    Use aptt to dose heparin while apixaban affecting heparin level.  aPTT this afternoon within goal at 81  Goal of Therapy:  Heparin level 0.3-0.7 units/ml aPTT 66-102 seconds Monitor platelets by anticoagulation protocol: Yes   Plan:  Continue heparin drip 1300 units/hr Daily aPTT, heparin level, and CBC  Elmer Sow, PharmD, BCPS, BCCCP Clinical Pharmacist [phone]  Please check AMION  for all [facility] Pharmacy numbers  02/16/2020 5:37 PM

## 2020-02-16 NOTE — Evaluation (Signed)
Clinical/Bedside Swallow Evaluation Patient Details  Name: Bryan Moran MRN: [id number] Date of Birth: May 14, 1946  Today's Date: 02/16/2020 Time: SLP Start Time (ACUTE ONLY): 4496 SLP Stop Time (ACUTE ONLY): 0924 SLP Time Calculation (min) (ACUTE ONLY): 14 min  Past Medical History:  Past Medical History:  Diagnosis Date  . A-fib (HCC)   . CHF (congestive heart failure) (HCC)   . Hypertension    Past Surgical History: History reviewed. No pertinent surgical history. HPI:  74 year old Caucasian male with past medical history remarkable for chronic combined systolic/diastolic congestive heart failure last EF 25-30% with previous refusal of ICD, chronic left bundle branch block, essential hypertension, paroxysmal/persistent atrial fibrillation CKD stage III who presented with progressive shortness of breath, weakness. Pt was recently hospitalized from 5/11-5/19 at [facility] for ADHF and AF RVR.  Dx cardiogenic shock, acute hypoxic resp failure.  No hx of dysphagia per chart review.    Assessment / Plan / Recommendation Clinical Impression  Pt presents with normal swallowing with adequate attention and mastication, brisk swallow response, no s/s of aspiration.  There is good synchrony of swallow/respiratory pattern.  No dysphagia.  Continue current regular diet.  No SLP f/u needed. D/W RN.  SLP Visit Diagnosis: Dysphagia, unspecified (R13.10)    Aspiration Risk  No limitations    Diet Recommendation   regular solids, thin liquids  Medication Administration: Whole meds with liquid    Other  Recommendations Oral Care Recommendations: Oral care BID   Follow up Recommendations None      Frequency and Duration            Prognosis        Swallow Study   General HPI: 74 year old Caucasian male with past medical history remarkable for chronic combined systolic/diastolic congestive heart failure last EF 25-30% with previous refusal of ICD, chronic left bundle branch block, essential  hypertension, paroxysmal/persistent atrial fibrillation CKD stage III who presented with progressive shortness of breath, weakness. Pt was recently hospitalized from 5/11-5/19 at [facility] for ADHF and AF RVR.  Dx cardiogenic shock, acute hypoxic resp failure.  No hx of dysphagia per chart review.  Type of Study: Bedside Swallow Evaluation Previous Swallow Assessment: no Diet Prior to this Study: Regular;Thin liquids Temperature Spikes Noted: No Respiratory Status: Nasal cannula History of Recent Intubation: No Behavior/Cognition: Alert;Cooperative Oral Cavity Assessment: Within Functional Limits Oral Care Completed by SLP: No Oral Cavity - Dentition: Adequate natural dentition Vision: Functional for self-feeding Self-Feeding Abilities: Able to feed self Patient Positioning: Upright in bed Baseline Vocal Quality: Normal Volitional Cough: Strong Volitional Swallow: Able to elicit    Oral/Motor/Sensory Function Overall Oral Motor/Sensory Function: Within functional limits   Ice Chips Ice chips: Within functional limits   Thin Liquid Thin Liquid: Within functional limits    Nectar Thick Nectar Thick Liquid: Not tested   Honey Thick Honey Thick Liquid: Not tested   Puree Puree: Within functional limits   Solid     Solid: Within functional limits      Blenda Mounts Laurice 02/16/2020,9:25 AM    Marchelle Folks L. Samson Frederic, MA CCC/SLP Acute Rehabilitation Services Office number [phone] Pager [phone]

## 2020-02-16 NOTE — Progress Notes (Signed)
Replaced K of 3.0 with electrolyte protocol. AM BMP ordered

## 2020-02-16 NOTE — Progress Notes (Signed)
Civil engineer, contracting Documentation  Liaison received referral for pt to transfer to [facility] for EOL care. Pt's chart reviewed and pt is eligible for [facility] placement, however, no beds are available.   Liaison will follow up with TOC and family in the morning re: bed availability.   Please call with any questions and thank you for the referral.   Trena Platt, RN  [facility] Liaison [phone]

## 2020-02-16 NOTE — Progress Notes (Signed)
Palliative Medicine Inpatient Follow Up Note   HPI: Per intake H&P --> Bryan Moran a 74 year old Caucasian male with past medical history remarkable for chronic combined systolic/diastolic congestive heart failure last EF 25-30% with previous refusal of ICD, chronic left bundle branch block, essential hypertension, paroxysmal/persistent atrial fibrillation CKD stage III who presented with progressive shortness of breath, weakness.  Palliative care was asked to get involved in the setting of multiple hospitalizations.   Today's Discussion (02/16/2020): Chart reviewed. I met with Bryan Moran, her husband, BJ, and their family friend, Tom at bedside this afternoon. Explained to them that his heart failure was quite severe and that it had progressed significantly. Bryan Moran expressed that "I know I am going to die soon." I asked him what made him feel this way and he states that it is just a feeling he has. I asked him what had changed from yesterday as his goals were to gain strength and get back to his home. He stated that he had time to think about the present situation and he now realizes the severity of his heart failure. He expresses that he is not afraid to die but does wish to make sure all of his things are in order. When questioned on what things he vocalizes that he would like to verify his estate is in order. I shared with him that we do not do estate wills in the hospital but that he could have his lawyer come in to complete this paperwork. He and his family agreed with this as the next best plan.  I reviewed the advanced directive documents with Bryan Moran and his family. Bryan Moran and I completed a MOST and DNR form together. He said that he had given "a lot of thought" to our conversation from yesterday. He acknowledges that if his heart were to stop or breathing were to be severely impaired that CPR and intubation would not change his underlying diease process or contribute to a better quality of life. He  again shares that he knows he has more limited time left.   I asked Bryan Moran what is important to him. He states that it is important that he gets "things in order" for his family.   We again discussed the severity of Bryan Moran's heart failure which there is presently no good intervention for. I asked him if he had every considered hospice care. I explained that this is a service to preserve dignity and quality at the end of life. Kache had heard of hospice before. Bryan Moran states that it was brought to her attention by Dr. Haroldine Laws this afternoon. She made mention of [facility] residential hospice. I shared with Bryan Moran and his family that I could contact the liaison if they remain interested to provide them information. The all expressed interest in this.   Glendel spends some time recounting his life and reflection on the good times. He and Bryan Moran reminisce about their childhood. I provided therapeutic listening during this time.   Discussed the importance of continued conversation with family and their  medical providers regarding overall plan of care and treatment options, ensuring decisions are within the context of the patients values and GOCs.  Provided "Hard Choices for [employer]" booklet.   Decision Maker: Patient can make decisions for himself.  SUMMARY OF RECOMMENDATIONS DNAR/DNI   Continue current mode of treatment while patient completes estate planning  MOST completed --> IN Chart  DNR completed --> IN Chart  TOC --> Hospice Liaison for [facility] contacted  Patient  and his family informed that their lawyer could come to bedside to complete will as per discussion with CSW  Time In: 1330 Time Out:1500 Time Spent: 90 Greater than 50% of the time was spent in counseling and coordination of care ______________________________________________________________________________________ [place] Team Team Cell Phone: [phone] Please  utilize secure chat with additional questions, if there is no response within 30 minutes please call the above phone number  Palliative Medicine Team providers are available by phone from 7am to 7pm daily and can be reached through the team cell phone.  Should this patient require assistance outside of these hours, please call the patient's attending physician.

## 2020-02-16 NOTE — Progress Notes (Signed)
ANTICOAGULATION CONSULT NOTE  Pharmacy Consult for Heparin - while apixaban on hold Indication: atrial fibrillation  Not on File  Patient Measurements: Height: 6' (182.9 cm) Weight: 112.7 kg (248 lb 7.3 oz) IBW/kg (Calculated) : 77.6 Heparin Dosing Weight: 104 kg  Vital Signs: Temp: 98.2 F (36.8 C) (05/22 0400) Temp Source: Axillary (05/22 0400) BP: 93/58 (05/22 0600) Pulse Rate: 53 (05/22 0600)  Labs: Recent Labs    02/14/20 2110 02/14/20 2110 02/14/20 2211 02/14/20 2211 02/14/20 2225 02/14/20 2225 02/15/20 0602 02/16/20 0316  HGB  --   --  16.7  17.0   < > 16.0   < > 15.4 14.3  HCT  --   --  49.0  50.0   < > 49.4  --  48.6 43.2  PLT  --   --   --   --  143*  --  130* 123*  APTT  --   --   --   --   --   --   --  52*  LABPROT 29.3*  --   --   --   --   --   --   --   INR 2.9*  --   --   --   --   --   --   --   CREATININE 2.73*   < > 2.30*  --   --   --  2.31* 1.77*  TROPONINIHS 31*  --   --   --   --   --  33* 33*   < > = values in this interval not displayed.    Estimated Creatinine Clearance: 48.9 mL/min (A) (by C-G formula based on SCr of 1.77 mg/dL (H)).   Medical History: Past Medical History:  Diagnosis Date  . A-fib (HCC)   . CHF (congestive heart failure) (HCC)   . Hypertension      Assessment: 73yom with HF EF 20% and Afib recent DCCV but now back in afib - start amiodarone drip.  Patient is acutely ill in ICU.  Will hold apixaban (LD 5/21 am) and cover with heparin drip until more stable and procedures complete.    Use aptt to dose heparin while apixaban affecting heparin level.  aPTT this AM is slightly below goal at 52 sec. Heparin level falsely elevated 1.66.  Goal of Therapy:  Heparin level 0.3-0.7 units/ml aPTT 66-102 seconds Monitor platelets by anticoagulation protocol: Yes   Plan:  Increase heparin drip to 1300 units/hr Check 8 hour aPTT Daily aPTT, heparin level, and CBC Monitor s/s bleeding   Danae Orleans, PharmD,  [facility] PGY2 Cardiology Pharmacy Resident Phone [phone] 02/16/2020       7:37 AM  Please check [email or web address] for unit-specific pharmacist phone numbers

## 2020-02-16 NOTE — Progress Notes (Signed)
Inpatient Rehab Admissions Coordinator Note:   Per PT recommendation, pt was screened for CIR candidacy by Wolfgang Phoenix, MS, CCC-SLP.  At this time we are not recommending an Inpatient Rehab consult.  Will continue to follow pt's progress from afar d/t noted poor prognosis (limited further interventions) and possible consideration for palliative care/hospice. Please contact me with questions.    Wolfgang Phoenix, MS, CCC-SLP Admissions Coordinator [phone]

## 2020-02-16 NOTE — H&P (Signed)
NAME:  Bryan Moran, MRN:  [id number], DOB:  [birth year], LOS: 1 ADMISSION DATE:  02/14/2020, CONSULTATION DATE:  02/16/20 REFERRING MD:  Dr. Levora Angel, CHIEF COMPLAINT:  Hypotension   Brief History   Bryan Moran is a 74 year old Caucasian male with past medical history remarkable for chronic combined systolic/diastolic congestive heart failure last EF 25-30% with previous refusal of ICD, chronic left bundle branch block, essential hypertension, paroxysmal/persistent atrial fibrillation CKD stage III who presented with progressive shortness of breath, weakness.  History of present illness   54y M w PMHx of ICM, sCHF NYHAA Class IV, chronic LBBB, pAF on AC, CKD III presenting for shortness of breath and fatigue. Patient evaluated in the ER and noted to be hypotnesive, given 1L bolus. Patient is poor historian, aggravated and yelling "let me sleep I'm fine" then doses back off, history obtained from chart review. Patient adamantly refuses any symptoms, however uncertain of baseline mental status.   Past Medical History  sCHF NYHAA Class IV CKD pAF  Significant Hospital Events   none  Consults:  Cardiology Hospitalist > refused admission  Procedures:  None  Significant Diagnostic Tests:  Lactate 3.3 > 2.9  Micro Data:  COVID negative Cultures sent  Cefepime 5/20  Antimicrobials:  Cefepime 5/20 > pharmacy consult ordered Vacn consult ordered   Interim history/subjective:  Complains of feeling generally unwell.  Reports profound fatigue.  Denies marked shortness of breath.  Objective   Blood pressure 108/71, pulse (!) 113, temperature (!) 97.5 F (36.4 C), temperature source Axillary, resp. rate (!) 22, height 6' (1.829 m), weight 112.7 kg, SpO2 100 %. CVP:  [3 mmHg-6 mmHg] 6 mmHg      Intake/Output Summary (Last 24 hours) at 02/16/2020 1421 Last data filed at 02/16/2020 1200 Gross per 24 hour  Intake 1879.5 ml  Output 2775 ml  Net -895.5 ml   Filed Weights   02/14/20 2322  02/15/20 0500 02/16/20 0500  Weight: 122.5 kg 114.7 kg 112.7 kg    Examination: General: Tired looking.  Pale.  Generalized skin fragility. HENT: PERRL dry MM Lungs: CTAB  Cardiovascular: Irregularly irregular tachycardia.  JVP hard to evaluate.  Apex beat diffuse.  Heart sounds distant. Abdomen: soft, nontender nondistended Extremities: Generalized edema. Neuro: Aox1 no focal deficits but agitated, moving all extremities GU: deferred  Resolved Hospital Problem list   None   Assessment & Plan:    Critically ill due to acute decompensated HFrEF.  Advanced heart failure.  No further interventions possible.  Borderline cardiogenic shock.  Predominant right and forward failure symptoms. -Continue dobutamine. -Initiate furosemide infusion.  Cardiorenal syndrome with diuretic breaking. -Add metolazone.  Limited further interventions.  We will attempt to improve symptoms but is transitioning towards comfort care.  Best practice:  Diet: NPO Pain/Anxiety/Delirium protocol (if indicated): None VAP protocol (if indicated): None DVT prophylaxis: eliquis GI prophylaxis: home PPI Glucose control: none Mobility: bedrest Code Status: DNR. Family Communication: Disposition: admit to ICU  Labs   CBC: Recent Labs  Lab 02/14/20 2211 02/14/20 2225 02/15/20 0602 02/16/20 0316  WBC  --  11.9* 11.5* 9.9  NEUTROABS  --  11.2* 10.7*  --   HGB 16.7  17.0 16.0 15.4 14.3  HCT 49.0  50.0 49.4 48.6 43.2  MCV  --  96.9 100.6* 96.0  PLT  --  143* 130* 123*    Basic Metabolic Panel: Recent Labs  Lab 02/14/20 2110 02/14/20 2211 02/15/20 0602 02/16/20 0316  NA 140 138  139 139 139  K  5.4* 4.3  4.4 4.1 3.5  CL 103 102 104 103  CO2 21*  --  23 25  GLUCOSE 137* 128* 125* 144*  BUN 63* 56* 61* 53*  CREATININE 2.73* 2.30* 2.31* 1.77*  CALCIUM 8.7*  --  7.9* 7.8*  MG 2.2  --  2.2  --   PHOS  --   --  5.2*  --    GFR: Estimated Creatinine Clearance: 48.9 mL/min (A) (by C-G formula  based on SCr of 1.77 mg/dL (H)). Recent Labs  Lab 02/14/20 2201 02/14/20 2225 02/15/20 0011 02/15/20 0602 02/15/20 0935 02/16/20 0316  PROCALCITON  --   --   --  1.39  --  1.20  WBC  --  11.9*  --  11.5*  --  9.9  LATICACIDVEN 3.5*  --  2.9* 2.2* 2.5*  --     Liver Function Tests: Recent Labs  Lab 02/14/20 2110 02/15/20 0602 02/16/20 0316  AST 108* 49* 17  ALT 133* 98* 64*  ALKPHOS 208* 164* 131*  BILITOT 6.2* 4.4* 2.9*  PROT 5.4* 4.7* 4.5*  ALBUMIN 2.5* 2.1* 1.8*   No results for input(s): LIPASE, AMYLASE in the last 168 hours. No results for input(s): AMMONIA in the last 168 hours.  ABG    Component Value Date/Time   HCO3 25.7 02/14/2020 2211   TCO2 25 02/14/2020 2211   TCO2 27 02/14/2020 2211   ACIDBASEDEF 1.0 02/14/2020 2211   O2SAT 72.4 02/15/2020 1904     Coagulation Profile: Recent Labs  Lab 02/14/20 2110  INR 2.9*    Cardiac Enzymes: No results for input(s): CKTOTAL, CKMB, CKMBINDEX, TROPONINI in the last 168 hours.  HbA1C: No results found for: HGBA1C  CBG: Recent Labs  Lab 02/15/20 1137  GLUCAP 105*   CRITICAL CARE Performed by: Lynnell Catalan   Total critical care time: 30 minutes  Critical care time was exclusive of separately billable procedures and treating other patients.  Critical care was necessary to treat or prevent imminent or life-threatening deterioration.  Critical care was time spent personally by me on the following activities: development of treatment plan with patient and/or surrogate as well as nursing, discussions with consultants, evaluation of patient's response to treatment, examination of patient, obtaining history from patient or surrogate, ordering and performing treatments and interventions, ordering and review of laboratory studies, ordering and review of radiographic studies, pulse oximetry, re-evaluation of patient's condition and participation in multidisciplinary rounds.  Lynnell Catalan, MD [facility]  Physician [facility] [place] [facility]  Pager: [phone] Mobile: [phone] After hours: [phone].

## 2020-02-16 NOTE — Evaluation (Signed)
Occupational Therapy Evaluation Patient Details Name: Bryan Moran MRN: [id number] DOB: 07/09/1946 Today's Date: 02/16/2020    History of Present Illness 74 year old Caucasian male with past medical history remarkable for chronic combined systolic/diastolic congestive heart failure last EF 25-30% with previous refusal of ICD, chronic left bundle branch block, essential hypertension, paroxysmal/persistent atrial fibrillation CKD stage III who presented with progressive shortness of breath, weakness. Pt was recently hospitalized from 5/11-5/19 at [facility] for ADHF and AF RVR.  Dx cardiogenic shock, acute hypoxic resp failure.    Clinical Impression   Pt admitted with above diagnoses, with recent admit 5/11-5/19. Pt was home for very short period and had a fall at home. At time of eval, he completed bed mobility with mod/max A +2 for BLE and truncal support. He then completed sit <> stands with min A +2 for steadying and safety. SpO2 as low as 83% with sit <> stands on 2L [place] but quickly recovered. HR up to 110 bpm. Pt voices feeling depressed and "down" about current situation and outcomes, but states he still has goals of wanting to walk and perform BADL. OT recommends CIR, but saw mentions of possible hospice in chart review. Will continue to follow per POC below as it aligns with pt end of life planning.    Follow Up Recommendations  CIR;Other (comment)(saw mentions of possible hospice care, will follow for pts hospice needs and planning)    Equipment Recommendations  3 in 1 bedside commode    Recommendations for Other Services       Precautions / Restrictions Precautions Precautions: Fall Restrictions Weight Bearing Restrictions: No      Mobility Bed Mobility Overal bed mobility: Needs Assistance Bed Mobility: Supine to Sit;Sit to Supine     Supine to sit: Max assist;+2 for safety/equipment Sit to supine: Mod assist;+2 for physical assistance   General bed mobility comments: cues for  direction, assist up and forward via L elbow.  Pt able to assist move once he had UE's positioned well enough to assist pushing forward.  Transfers Overall transfer level: Needs assistance Equipment used: Rolling walker (2 wheeled) Transfers: Sit to/from Stand Sit to Stand: Min assist;+2 physical assistance         General transfer comment: cues for hand placement and assist rise and steady hips from EOB    Balance Overall balance assessment: Needs assistance Sitting-balance support: Single extremity supported;No upper extremity supported Sitting balance-Leahy Scale: Fair       Standing balance-Leahy Scale: Poor Standing balance comment: stood in the RW and pt was stabilized while he w/shifted then unweighted and took small steps in place each foot.                           ADL either performed or assessed with clinical judgement   ADL Overall ADL's : Needs assistance/impaired Eating/Feeding: Set up;Sitting   Grooming: Set up;Sitting   Upper Body Bathing: Minimal assistance;Sitting   Lower Body Bathing: Moderate assistance;Sit to/from stand;Sitting/lateral leans   Upper Body Dressing : Minimal assistance;Sitting   Lower Body Dressing: Moderate assistance;Sitting/lateral leans;Sit to/from stand   Toilet Transfer: Minimal assistance;+2 for physical assistance;+2 for safety/equipment;Stand-pivot;RW   Toileting- Clothing Manipulation and Hygiene: Set up;Sitting/lateral lean;Sit to/from stand   Tub/ Shower Transfer: Minimal assistance;Ambulation;Shower seat;Rolling walker   Functional mobility during ADLs: Minimal assistance;Rolling walker       Vision Patient Visual Report: No change from baseline       Perception  Praxis      Pertinent Vitals/Pain Pain Assessment: No/denies pain Faces Pain Scale: No hurt Pain Intervention(s): Monitored during session     Hand Dominance     Extremity/Trunk Assessment Upper Extremity Assessment Upper  Extremity Assessment: Generalized weakness   Lower Extremity Assessment Lower Extremity Assessment: Generalized weakness       Communication Communication Communication: No difficulties   Cognition Arousal/Alertness: Awake/alert Behavior During Therapy: Flat affect Overall Cognitive Status: Within Functional Limits for tasks assessed                                 General Comments: pt mentuions that he is feeling depressed with "a lot on his mind" due to discussing his poor prognosis   General Comments       Exercises     Shoulder Instructions      Home Living Family/patient expects to be discharged to:: Private residence Living Arrangements: Alone Available Help at Discharge: Family Type of Home: House Home Access: Stairs to enter Technical brewer of Steps: several Entrance Stairs-Rails: Right;Left Home Layout: One level     Bathroom Shower/Tub: Teacher, early years/pre: Standard     Home Equipment: Environmental consultant - 2 wheels;Cane - single point          Prior Functioning/Environment Level of Independence: Independent;Independent with assistive device(s)        Comments: had recent admission, went home for short period of time and had fall        OT Problem List: Decreased strength;Decreased knowledge of use of DME or AE;Decreased activity tolerance;Cardiopulmonary status limiting activity;Impaired balance (sitting and/or standing)      OT Treatment/Interventions: Self-care/ADL training;Therapeutic exercise;Patient/family education;Balance training;Energy conservation;Therapeutic activities;DME and/or AE instruction    OT Goals(Current goals can be found in the care plan section) Acute Rehab OT Goals Patient Stated Goal: I want to be able to do what I want to do. OT Goal Formulation: With patient Time For Goal Achievement: 03/01/20 Potential to Achieve Goals: Good  OT Frequency: Min 2X/week   Barriers to D/C:             Co-evaluation              AM-PAC OT "6 Clicks" Daily Activity     Outcome Measure                 End of Session Equipment Utilized During Treatment: Gait belt;Rolling walker;Oxygen Nurse Communication: Mobility status  Activity Tolerance: Patient limited by fatigue Patient left: in bed;with call bell/phone within reach  OT Visit Diagnosis: Unsteadiness on feet (R26.81);Other abnormalities of gait and mobility (R26.89);Muscle weakness (generalized) (M62.81)                Time: 4765-4650 OT Time Calculation (min): 24 min Charges:  OT General Charges $OT Visit: 1 Visit OT Evaluation $OT Eval Moderate Complexity: [place], MSOT, OTR/L [place] [facility] Office Number: [phone] Pager: [phone]  Zenovia Jarred 02/16/2020, 3:48 PM

## 2020-02-16 NOTE — Progress Notes (Addendum)
Advanced Heart Failure Rounding Note   Subjective:    Remains on dobutamine at 5. Lasix gtt at 8.   Moderate diuresis. Weight down 4 pounds. Co-ox now 72% Cr 2.3 -> 1.7  CVP 5-6  Feels weak. Poor appetite. Denies SOB, orthopnea or PND  PCT 1.2 on abx  Objective:   Weight Range:  Vital Signs:   Temp:  [96.8 F (36 C)-98.2 F (36.8 C)] 97.5 F (36.4 C) (05/22 1119) Pulse Rate:  [32-119] 60 (05/22 1100) Resp:  [6-28] 18 (05/22 1100) BP: (83-122)/(51-90) 122/82 (05/22 1100) SpO2:  [93 %-100 %] 97 % (05/22 1100) Weight:  [112.7 kg] 112.7 kg (05/22 0500) Last BM Date: 02/15/20  Weight change: Filed Weights   02/14/20 2322 02/15/20 0500 02/16/20 0500  Weight: 122.5 kg 114.7 kg 112.7 kg    Intake/Output:   Intake/Output Summary (Last 24 hours) at 02/16/2020 1211 Last data filed at 02/16/2020 1200 Gross per 24 hour  Intake 2228.5 ml  Output 2775 ml  Net -546.5 ml     Physical Exam: General:  Frail weak appearing. No resp difficulty HEENT: normal Neck: supple. JVP flat. RIJ TLC Carotids 2+ bilat; no bruits. No lymphadenopathy or thryomegaly appreciated. Cor: PMI nondisplaced. Irregular Lungs: clear Abdomen: soft, nontender, nondistended. No hepatosplenomegaly. No bruits or masses. Good bowel sounds. Extremities: no cyanosis, clubbing, rash, 2-3+ edema Neuro: alert & orientedx3, cranial nerves grossly intact. moves all 4 extremities w/o difficulty. Affect pleasant  Telemetry:  AF 90-100 + PVCs Personally reviewed  Labs: Basic Metabolic Panel: Recent Labs  Lab 02/14/20 2110 02/14/20 2211 02/15/20 0602 02/16/20 0316  NA 140 138  139 139 139  K 5.4* 4.3  4.4 4.1 3.5  CL 103 102 104 103  CO2 21*  --  23 25  GLUCOSE 137* 128* 125* 144*  BUN 63* 56* 61* 53*  CREATININE 2.73* 2.30* 2.31* 1.77*  CALCIUM 8.7*  --  7.9* 7.8*  MG 2.2  --  2.2  --   PHOS  --   --  5.2*  --     Liver Function Tests: Recent Labs  Lab 02/14/20 2110 02/15/20 0602  02/16/20 0316  AST 108* 49* 17  ALT 133* 98* 64*  ALKPHOS 208* 164* 131*  BILITOT 6.2* 4.4* 2.9*  PROT 5.4* 4.7* 4.5*  ALBUMIN 2.5* 2.1* 1.8*   No results for input(s): LIPASE, AMYLASE in the last 168 hours. No results for input(s): AMMONIA in the last 168 hours.  CBC: Recent Labs  Lab 02/14/20 2211 02/14/20 2225 02/15/20 0602 02/16/20 0316  WBC  --  11.9* 11.5* 9.9  NEUTROABS  --  11.2* 10.7*  --   HGB 16.7  17.0 16.0 15.4 14.3  HCT 49.0  50.0 49.4 48.6 43.2  MCV  --  96.9 100.6* 96.0  PLT  --  143* 130* 123*    Cardiac Enzymes: No results for input(s): CKTOTAL, CKMB, CKMBINDEX, TROPONINI in the last 168 hours.  BNP: BNP (last 3 results) Recent Labs    02/14/20 0021 02/15/20 0602 02/16/20 0316  BNP 463.6* 1,408.4* 2,798.0*    ProBNP (last 3 results) No results for input(s): PROBNP in the last 8760 hours.    Other results:  Imaging: CT Head Wo Contrast  Result Date: 02/14/2020 CLINICAL DATA:  Head trauma EXAM: CT HEAD WITHOUT CONTRAST TECHNIQUE: Contiguous axial images were obtained from the base of the skull through the vertex without intravenous contrast. COMPARISON:  None. FINDINGS: Brain: Mild motion degradation. No acute territorial  infarction, hemorrhage or intracranial mass. Mild atrophy. Mild chronic small vessel ischemic change of the white matter. 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 Nonenlarged ventricles Vascular: No hyperdense vessels.  Scattered carotid calcification Skull: Normal. Negative for fracture or focal lesion. Sinuses/Orbits: No acute finding. Other: None IMPRESSION: 1. Mild motion degradation. No definite CT evidence for acute intracranial abnormality. 2. Atrophy and mild chronic small vessel ischemic change of the white matter Electronically Signed   By: Donavan Foil M.D.   On: 02/14/2020 21:48   DG CHEST PORT 1 VIEW  Result Date: 02/16/2020 CLINICAL DATA:  Acute respiratory failure EXAM:  PORTABLE CHEST 1 VIEW COMPARISON:  02/15/2020 FINDINGS: Cardiac shadow is enlarged but stable. Right jugular central line is noted at the cavoatrial junction. Patchy bilateral opacities are seen and stable. Small right-sided pleural effusion is seen. No pneumothorax is noted. IMPRESSION: Overall stable appearance of the chest when compared with the previous day. Electronically Signed   By: Inez Catalina M.D.   On: 02/16/2020 08:36   DG CHEST PORT 1 VIEW  Result Date: 02/15/2020 CLINICAL DATA:  Central line placement. EXAM: PORTABLE CHEST 1 VIEW COMPARISON:  Radiographs 02/14/2020 and 02/05/2020. FINDINGS: 1850 hours. Right IJ central venous catheter projects to the level of the superior cavoatrial junction. Stable cardiomegaly and aortic atherosclerosis. Bilateral upper lobe airspace opacities have mildly progressed. There are stable small bilateral pleural effusions. No pneumothorax or acute osseous findings. IMPRESSION: 1. Right IJ central venous catheter placement without demonstrated complication. 2. Mildly progressive bilateral upper lobe airspace opacities consistent with pneumonia. Electronically Signed   By: Richardean Sale M.D.   On: 02/15/2020 18:58   DG Chest Portable 1 View  Result Date: 02/14/2020 CLINICAL DATA:  Status post fall with subsequent chest pain and shortness of breath. EXAM: PORTABLE CHEST 1 VIEW COMPARISON:  Feb 05, 2020 FINDINGS: Mild hazy infiltrates are seen within the bilateral upper lobes and right lung base. This is increased in severity when compared to the prior study. There is no evidence of a pleural effusion or pneumothorax. The cardiac silhouette is markedly enlarged and unchanged in size. The visualized skeletal structures are unremarkable. IMPRESSION: 1. Mild hazy bilateral upper lobe and right basilar infiltrates. 2. Stable cardiomegaly. Electronically Signed   By: Virgina Norfolk M.D.   On: 02/14/2020 21:22   [place] EKG SITE RITE  Result Date: 02/15/2020 If Site  Rite image not attached, placement could not be confirmed due to current cardiac rhythm.  US Abdomen Limited RUQ  Result Date: 02/15/2020 CLINICAL DATA:  Elevated LFTs. EXAM: ULTRASOUND ABDOMEN LIMITED RIGHT UPPER QUADRANT COMPARISON:  CT of the chest on 02/05/2020 FINDINGS: Gallbladder: Gallbladder is distended. Gallbladder wall is 9.5 millimeters in thickness and mildly heterogeneous. No sonographic Murphy's sign. Sludge is present. A small stone is 8 millimeters. There is pericholecystic fluid. Common bile duct: Diameter: Not well seen distally.  2.4 millimeters Liver: Nodular contour of the liver. Small cyst is identified adjacent to the gallbladder fossa, measuring 1.5 x 0.9 x 1.0 centimeters. No solid mass identified. Perihepatic fluid noted. Portal vein is patent on color Doppler imaging with normal direction of blood flow towards the liver. Other: Study quality is degraded by limited patient mobility. IMPRESSION: 1. Cirrhosis and perihepatic fluid. 2. Benign hepatic cyst, 1.5 centimeters. 3. Gallbladder wall thickening, pericholecystic fluid, sludge, and stones. Although these findings can be present in the setting of acute cholecystitis, the lack of sonographic Percell Miller sign makes the findings less specific.  As needed, hepatobiliary scan can be performed to evaluate for acute cholecystitis. Electronically Signed   By: Norva Pavlov M.D.   On: 02/15/2020 10:29      Medications:     Scheduled Medications: . Chlorhexidine Gluconate Cloth  6 each Topical Daily  . feeding supplement (ENSURE ENLIVE)  237 mL Oral BID BM  . pantoprazole  40 mg Oral Daily  . sodium chloride flush  10-40 mL Intracatheter Q12H     Infusions: . amiodarone 30 mg/hr (02/16/20 1200)  . ceFEPime (MAXIPIME) IV Stopped (02/16/20 6440)  . DOBUTamine 5 mcg/kg/min (02/16/20 1200)  . furosemide (LASIX) infusion 8 mg/hr (02/16/20 1200)  . heparin 1,300 Units/hr (02/16/20 1200)     PRN Medications:  docusate sodium,  ipratropium-albuterol, ondansetron (ZOFRAN) IV, polyethylene glycol, sodium chloride flush   Assessment/Plan :   1. Shock, suspect Cardiogenic +/- septic - ECHO 01/2020 EF 20-25% . 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  - Lactic Acid 3.5  Blood CX pending. - Started on vanc, cefepime and flagyl - Renal function, lactate and co-ox all improved on dobutamine. However he remains very weak.  - Favor Palliative Care/Hospice involvement   2. Acute on chronic systolic HF with biventricular dysfunction - EF 20% severe RV dysfunction - Presumed NICM  - EF did not improve with maintenance of NSR. Refused CRT for LBBB - Suspect now end-stage. Now requiring dobutamine - Co-ox improved. CVP low but significant LE edema.  - Place TED hose. Gentle diuresis - End-stage. Not candidate for advanced therapies. Would recommend stopping dobutamine soon and referral to Hospice  3. A/C Hypoxic Respiratory Failure -CXR with R basilar infiltrates. PCT 1.2 On antibiotics per CCM.  -Sats stable on 3 liters -Diurese with IV lasix.   4. A Fib RVR, persistent -Had cardioversion 02/08/20 with brief conversion to NSR. He later went back in A fib.  - Remains on IV amio and heparin. Can transition once plan more clear.  5.  AKI -Most recent creatinine 1.48 on 02/12/20  - Improving with inotrope support  5  LBBB  In the past he refused CRT-D   6. Severe protein calorie malnutrition  7. DNR/DNI  He is terminally ill with end-stage HF and MSOF. I had a long talk with him yesterday and he feels that he has deteriorated to the point where he can no longer take care of himself.   We discussed the options of comfort care/Hopsice versus a brief trial of aggressive care. For now he would like a brief trial of aggressive care with inotropic support and potentially another attempt at [facility]. He would not want intubation or CPR.   I discussed with his sister by phone who agrees, She will be coming today to meet  with him and Palliative Care. She is concerned that he doesn't have a will and would like to get this done while in the hospital, if possible.  CRITICAL CARE Performed by: [name]  Total critical care time: 35 minutes  Critical care time was exclusive of separately billable procedures and treating other patients.  Critical care was necessary to treat or prevent imminent or life-threatening deterioration.  Critical care was time spent personally by me (independent of midlevel providers or residents) on the following activities: development of treatment plan with patient and/or surrogate as well as nursing, discussions with consultants, evaluation of patient's response to treatment, examination of patient, obtaining history from patient or surrogate, ordering and performing treatments and interventions, ordering and review of laboratory  studies, ordering and review of radiographic studies, pulse oximetry and re-evaluation of patient's condition.     Length of Stay: 1   [name] MD 02/16/2020, 12:11 PM  Advanced Heart Failure Team Pager [phone] (M-F; 7a - 4p)  Please contact [facility] for night-coverage after hours (4p -7a ) and weekends on [email or web address]

## 2020-02-17 DIAGNOSIS — I4821 Permanent atrial fibrillation: Secondary | ICD-10-CM

## 2020-02-17 DIAGNOSIS — Z7189 Other specified counseling: Secondary | ICD-10-CM

## 2020-02-17 LAB — BASIC METABOLIC PANEL
Anion gap: 8 (ref 5–15)
Anion gap: 11 (ref 5–15)
Anion gap: 12 (ref 5–15)
BUN: 48 mg/dL — ABNORMAL HIGH (ref 8–23)
BUN: 47 mg/dL — ABNORMAL HIGH (ref 8–23)
BUN: 46 mg/dL — ABNORMAL HIGH (ref 8–23)
CO2: 27 mmol/L (ref 22–32)
CO2: 29 mmol/L (ref 22–32)
CO2: 28 mmol/L (ref 22–32)
Calcium: 7.4 mg/dL — ABNORMAL LOW (ref 8.9–10.3)
Calcium: 7.7 mg/dL — ABNORMAL LOW (ref 8.9–10.3)
Calcium: 7.6 mg/dL — ABNORMAL LOW (ref 8.9–10.3)
Chloride: 101 mmol/L (ref 98–111)
Chloride: 99 mmol/L (ref 98–111)
Chloride: 96 mmol/L — ABNORMAL LOW (ref 98–111)
Creatinine, Ser: 1.78 mg/dL — ABNORMAL HIGH (ref 0.61–1.24)
Creatinine, Ser: 1.67 mg/dL — ABNORMAL HIGH (ref 0.61–1.24)
Creatinine, Ser: 1.77 mg/dL — ABNORMAL HIGH (ref 0.61–1.24)
GFR calc Af Amer: 43 mL/min — ABNORMAL LOW (ref >60)
GFR calc Af Amer: 47 mL/min — ABNORMAL LOW (ref >60)
GFR calc Af Amer: 44 mL/min — ABNORMAL LOW (ref >60)
GFR calc non Af Amer: 37 mL/min — ABNORMAL LOW (ref >60)
GFR calc non Af Amer: 40 mL/min — ABNORMAL LOW (ref >60)
GFR calc non Af Amer: 38 mL/min — ABNORMAL LOW (ref >60)
Glucose, Bld: 118 mg/dL — ABNORMAL HIGH (ref 70–99)
Glucose, Bld: 135 mg/dL — ABNORMAL HIGH (ref 70–99)
Glucose, Bld: 163 mg/dL — ABNORMAL HIGH (ref 70–99)
Potassium: 3.2 mmol/L — ABNORMAL LOW (ref 3.5–5.1)
Potassium: 3.2 mmol/L — ABNORMAL LOW (ref 3.5–5.1)
Potassium: 3 mmol/L — ABNORMAL LOW (ref 3.5–5.1)
Sodium: 136 mmol/L (ref 135–145)
Sodium: 139 mmol/L (ref 135–145)
Sodium: 136 mmol/L (ref 135–145)

## 2020-02-17 LAB — HEPARIN LEVEL (UNFRACTIONATED): Heparin Unfractionated: 0.87 IU/mL — ABNORMAL HIGH (ref 0.30–0.70)

## 2020-02-17 LAB — APTT
aPTT: 60 seconds — ABNORMAL HIGH (ref 24–36)
aPTT: 97 seconds — ABNORMAL HIGH (ref 24–36)

## 2020-02-17 LAB — PROCALCITONIN: Procalcitonin: 0.88 ng/mL

## 2020-02-17 LAB — MAGNESIUM
Magnesium: 1.8 mg/dL (ref 1.7–2.4)
Magnesium: 2 mg/dL (ref 1.7–2.4)

## 2020-02-17 MED ORDER — DOBUTAMINE IN D5W 4-5 MG/ML-% IV SOLN: 5.0000 ug/kg/min | INTRAVENOUS | Status: DC

## 2020-02-17 MED ORDER — DOBUTAMINE IN D5W 4-5 MG/ML-% IV SOLN
5.0000 ug/kg/min | INTRAVENOUS | Status: DC
  Administered 2020-02-17 – 2020-02-24 (×7): 5 ug/kg/min via INTRAVENOUS
  Filled 2020-02-17 (×7): qty 250

## 2020-02-17 MED ORDER — MAGNESIUM SULFATE 2 GM/50ML IV SOLN
2.0000 g | Freq: Once | INTRAVENOUS | Status: AC
  Administered 2020-02-17: 2 g via INTRAVENOUS
  Filled 2020-02-17: qty 50

## 2020-02-17 MED ORDER — TRIAMCINOLONE ACETONIDE 0.1 % EX CREA
1.0000 "application " | TOPICAL_CREAM | Freq: Every day | CUTANEOUS | Status: DC
  Administered 2020-02-20 – 2020-02-25 (×3): 1 via TOPICAL
  Filled 2020-02-17 (×2): qty 15

## 2020-02-17 MED ORDER — POTASSIUM CHLORIDE 10 MEQ/50ML IV SOLN
10.0000 meq | INTRAVENOUS | Status: AC
  Administered 2020-02-17 (×4): 10 meq via INTRAVENOUS
  Filled 2020-02-17 (×5): qty 50

## 2020-02-17 MED ORDER — AMIODARONE HCL IN DEXTROSE 360-4.14 MG/200ML-% IV SOLN
30.0000 mg/h | INTRAVENOUS | Status: DC
  Administered 2020-02-18 – 2020-02-19 (×5): 30 mg/h via INTRAVENOUS
  Filled 2020-02-17 (×5): qty 200

## 2020-02-17 MED ORDER — TORSEMIDE 20 MG PO TABS: 20.0000 mg | ORAL_TABLET | Freq: Two times a day (BID) | ORAL | Status: DC

## 2020-02-17 MED ORDER — AMIODARONE HCL 200 MG PO TABS: 200.0000 mg | ORAL_TABLET | Freq: Every day | ORAL | Status: DC

## 2020-02-17 MED ORDER — POTASSIUM CHLORIDE 20 MEQ PO PACK: 20.0000 meq | PACK | Freq: Every day | ORAL | Status: DC

## 2020-02-17 MED ORDER — CARVEDILOL 3.125 MG PO TABS
3.1250 mg | ORAL_TABLET | Freq: Two times a day (BID) | ORAL | Status: DC
  Administered 2020-02-17 – 2020-02-21 (×6): 3.125 mg via ORAL
  Filled 2020-02-17 (×6): qty 1

## 2020-02-17 MED ORDER — FUROSEMIDE 10 MG/ML IJ SOLN
8.0000 mg/h | INTRAVENOUS | Status: DC
  Administered 2020-02-17: 8 mg/h via INTRAVENOUS
  Filled 2020-02-17: qty 20

## 2020-02-17 MED ORDER — HEPARIN (PORCINE) 25000 UT/250ML-% IV SOLN
1400.0000 [IU]/h | INTRAVENOUS | Status: DC
  Administered 2020-02-17 – 2020-02-18 (×2): 1400 [IU]/h via INTRAVENOUS
  Filled 2020-02-17: qty 250

## 2020-02-17 MED ORDER — POTASSIUM CHLORIDE CRYS ER 20 MEQ PO TBCR
20.0000 meq | EXTENDED_RELEASE_TABLET | ORAL | Status: AC
  Administered 2020-02-17 (×2): 20 meq via ORAL
  Filled 2020-02-17 (×2): qty 1

## 2020-02-17 MED ORDER — LISINOPRIL 20 MG PO TABS
40.0000 mg | ORAL_TABLET | Freq: Every day | ORAL | Status: DC
  Administered 2020-02-20: 40 mg via ORAL
  Filled 2020-02-17 (×3): qty 2

## 2020-02-17 MED ORDER — POTASSIUM CHLORIDE 10 MEQ/50ML IV SOLN
10.0000 meq | INTRAVENOUS | Status: AC
  Administered 2020-02-17 – 2020-02-18 (×4): 10 meq via INTRAVENOUS
  Filled 2020-02-17 (×4): qty 50

## 2020-02-17 NOTE — Progress Notes (Signed)
RN reviewed orders and cefepime order was discontinued. Elink provider was notified and RN asked for clarification. Per MD "PCCM note does not indicate abx needed, will not reorder"

## 2020-02-17 NOTE — Progress Notes (Signed)
   Palliative Medicine Inpatient Follow Up Note   HPI: Per intake H&P --> Bryan Moran a 74 year old Caucasian male with past medical history remarkable for chronic combined systolic/diastolic congestive heart failure last EF 25-30% with previous refusal of ICD, chronic left bundle branch block, essential hypertension, paroxysmal/persistent atrial fibrillation CKD stage III who presented with progressive shortness of breath, weakness.  Palliative care was asked to get involved in the setting of multiple hospitalizations.   Today's Discussion (02/17/2020): Chart reviewed. I met with Bryan Moran at bedside. We had a conversation about his life and how he has no regrets at the present time. While in the room Dr. Lynetta Mare came in to discussion transition from IV to PO iterations of diuretic medications. Afterwards I again explained the process of comfort oriented care and the utilization of medications for symptoms relief and not curative purposes. He seemed to understand and be in agreement with this plan.   Bryan Moran continued to share that he has lived an immensely full life. He shares that he has forgiveness in his heart for all the people who had done him wrong throughout his life. He vocalized making a mends with his other sister who lives in [place].   He again discussed that he did not realize the severity of his hear failure. He said that truly up until his second covid vaccination he was overall feeling fairly well. Allowed him time to provide his feelings allowing a safe space for emotions.  Per my conversation with he and Bryan Moran the plan will be for transition to [facility] as early as tomorrow if a bed is made available.   Decision Maker: Patient can make decisions for himself.  SUMMARY OF RECOMMENDATIONS DNAR/DNI   Plan for transition to [facility] once a bed is made available  Patient will transition out of ICU today, per intensivist will transition him to oral diruetics  Time  Spent: 45 Greater than 50% of the time was spent in counseling and coordination of care ______________________________________________________________________________________ [place] Team Team Cell Phone: [phone] Please utilize secure chat with additional questions, if there is no response within 30 minutes please call the above phone number  Palliative Medicine Team providers are available by phone from 7am to 7pm daily and can be reached through the team cell phone.  Should this patient require assistance outside of these hours, please call the patient's attending physician.

## 2020-02-17 NOTE — Progress Notes (Signed)
Inpatient Rehab Admissions Coordinator:  During chart review noted pt awaiting bed at [facility].  Will no longer follow.    Wolfgang Phoenix, MS, CCC-SLP Admissions Coordinator [phone]

## 2020-02-17 NOTE — Progress Notes (Signed)
Notified by [facility] that no bed available at [facility] today. TOC team continuing to follow for transition needs.   Hortencia Conradi, RN MSN CCM Transitions of Care [phone]

## 2020-02-17 NOTE — Progress Notes (Signed)
Accidentally put the wrong temperature at first. - Kendall Justo Jory Ee NT 1141am

## 2020-02-17 NOTE — Progress Notes (Signed)
K 3.2 on morning labs despite 6 runs of KCL 10 mEq via central line. Mag 1.8  RN followed Elink protocol and initiated 4 x KCL 10 MeQ via central line and oral KCL  20 mEq x 2; Supplementing Mag sulfate IV  Patient has voided over 3.5 L on my shift

## 2020-02-17 NOTE — Progress Notes (Signed)
ANTICOAGULATION CONSULT NOTE  Pharmacy Consult for Heparin - while apixaban on hold Indication: atrial fibrillation  Not on File  Patient Measurements: Height: 6' (182.9 cm) Weight: 109.4 kg (241 lb 2.9 oz) IBW/kg (Calculated) : 77.6 Heparin Dosing Weight: 104 kg  Vital Signs: Temp: 97.9 F (36.6 C) (05/23 1518) Temp Source: Oral (05/23 1518) BP: 116/66 (05/23 1630) Pulse Rate: 96 (05/23 1630)  Labs: Recent Labs    02/14/20 2110 02/14/20 2211 02/14/20 2225 02/14/20 2225 02/15/20 0602 02/15/20 0602 02/16/20 0316 02/16/20 0316 02/16/20 0852 02/16/20 1645 02/17/20 0425 02/17/20 1538  HGB  --    < > 16.0   < > 15.4  --  14.3  --   --   --   --   --   HCT  --    < > 49.4  --  48.6  --  43.2  --   --   --   --   --   PLT  --   --  143*  --  130*  --  123*  --   --   --   --   --   APTT  --   --   --   --   --   --  52*   < >  --  81* 60* 97*  LABPROT 29.3*  --   --   --   --   --   --   --   --   --   --   --   INR 2.9*  --   --   --   --   --   --   --   --   --   --   --   HEPARINUNFRC  --   --   --   --   --   --  1.66*  --   --   --  0.87*  --   CREATININE 2.73*   < >  --    < > 2.31*   < > 1.77*   < >  --  1.68* 1.78* 1.67*  TROPONINIHS 31*  --   --    < > 33*  --  33*  --  33*  --   --   --    < > = values in this interval not displayed.    Estimated Creatinine Clearance: 51.1 mL/min (A) (by C-G formula based on SCr of 1.67 mg/dL (H)).  Assessment: 73yom with HF EF 20% and Afib recent DCCV but now back in afib - start amiodarone drip.  Patient is acutely ill in ICU.  Will hold apixaban (LD 5/21 am) and cover with heparin drip until more stable and procedures complete.    Use aPTT to dose heparin while apixaban affecting heparin level.  aPTT this pm is within goal range  Goal of Therapy:  Heparin level 0.3-0.7 units/ml aPTT 66-102 seconds Monitor platelets by anticoagulation protocol: Yes   Plan:  Continue heparin drip to 1400 units/hr Daily aPTT  CBC  Elmer Sow, PharmD, BCPS, BCCCP Clinical Pharmacist [phone]  Please check AMION for all [facility] Pharmacy numbers  02/17/2020 6:51 PM

## 2020-02-17 NOTE — Progress Notes (Addendum)
eLink Physician-Brief Progress Note Patient Name: Bryan Moran DOB: [birth year] MRN: [id number]   Date of Service  02/17/2020  HPI/Events of Note  K 3.2, creatinine 1.6 Appears that oral potassium ws ordered after this, order states 20 meq daily x 3 doses   eICU Interventions  Those labs were from this afternoon Check BMP and mag now, and clarify how much oral potassium was given based on this order RN requested to call back with info and results     Intervention Category Intermediate Interventions: Electrolyte abnormality - evaluation and management  Theresea Trautmann G Shambhavi Salley 02/17/2020, 8:14 PM   9.20 pm - Notified of K 3.0 RN clarified that evening dose of oral potassium was not given I have discontinued that order 40 meq IV Kcl to be given now Mag of 2.0 is acceptable Repeat BMP to be done 1 hour after potassium has infused   5am K is 3.2 Repeat 40 meq IV Kcl x 1 Repeat labs later today

## 2020-02-17 NOTE — Progress Notes (Signed)
NAME:  Bryan Moran, MRN:  [id number], DOB:  1945/11/10, LOS: 2 ADMISSION DATE:  02/14/2020, CONSULTATION DATE:  02/17/20 REFERRING MD:  Dr. Fernanda Drum, CHIEF COMPLAINT:  Hypotension   Brief History   Bryan Moran is a 74 year old Caucasian male with past medical history remarkable for chronic combined systolic/diastolic congestive heart failure last EF 25-30% with previous refusal of ICD, chronic left bundle branch block, essential hypertension, paroxysmal/persistent atrial fibrillation CKD stage III who presented with progressive shortness of breath, weakness.  History of present illness   56y M w PMHx of ICM, sCHF NYHAA Class IV, chronic LBBB, pAF on AC, CKD III presenting for shortness of breath and fatigue. Patient evaluated in the ER and noted to be hypotnesive, given 1L bolus. Patient is poor historian, aggravated and yelling "let me sleep I'm fine" then doses back off, history obtained from chart review. Patient adamantly refuses any symptoms, however uncertain of baseline mental status.   Past Medical History  sCHF NYHAA Class IV CKD pAF  Significant Hospital Events   none  Consults:  Cardiology Hospitalist > refused admission  Procedures:  None  Significant Diagnostic Tests:  Lactate 3.3 > 2.9  Micro Data:  COVID negative Cultures sent  Cefepime 5/20  Antimicrobials:  Cefepime 5/20 > pharmacy consult ordered Vacn consult ordered   Interim history/subjective:  Complains of feeling generally unwell.  Reports profound fatigue.  Denies marked shortness of breath.  Objective   Blood pressure 99/61, pulse 72, temperature 97.9 F (36.6 C), temperature source Oral, resp. rate (!) 30, height 6' (1.829 m), weight 109.4 kg, SpO2 98 %. CVP:  [4 mmHg-13 mmHg] 4 mmHg      Intake/Output Summary (Last 24 hours) at 02/17/2020 1604 Last data filed at 02/17/2020 1545 Gross per 24 hour  Intake 2754.13 ml  Output 7775 ml  Net -5020.87 ml   Filed Weights   02/15/20 0500 02/16/20 0500  02/17/20 0500  Weight: 114.7 kg 112.7 kg 109.4 kg    Examination: General: Tired looking.  Pale.  Generalized skin fragility. HENT: PERRL dry MM Lungs: CTAB  Cardiovascular: Irregularly irregular tachycardia.  JVP hard to evaluate.  Apex beat diffuse.  Heart sounds distant. Abdomen: soft, nontender nondistended Extremities: Generalized edema. Neuro: Aox1 no focal deficits but agitated, moving all extremities GU: deferred  Resolved Hospital Problem list   None   Assessment & Plan:    Critically ill due to acute decompensated HFrEF.  Advanced heart failure.  No further interventions possible.  Borderline cardiogenic shock.  Predominant right and forward failure symptoms. Patient has opted for hospice care.  - Will transition back to oral medications with goal of reducing symptoms, including diuretics.  - Comfort care   Best practice:  Diet: DAT with supplements  Pain/Anxiety/Delirium protocol (if indicated): None VAP protocol (if indicated): None DVT prophylaxis: eliquis GI prophylaxis: home PPI Glucose control: none Mobility: bedrest Code Status: DNR/DNI. Family Communication: transfer to ward today.   Labs   CBC: Recent Labs  Lab 02/14/20 2211 02/14/20 2225 02/15/20 0602 02/16/20 0316  WBC  --  11.9* 11.5* 9.9  NEUTROABS  --  11.2* 10.7*  --   HGB 16.7  17.0 16.0 15.4 14.3  HCT 49.0  50.0 49.4 48.6 43.2  MCV  --  96.9 100.6* 96.0  PLT  --  143* 130* 123*    Basic Metabolic Panel: Recent Labs  Lab 02/14/20 2110 02/14/20 2110 02/14/20 2211 02/15/20 0602 02/16/20 0316 02/16/20 1645 02/17/20 0425  NA 140   < >  138  139 139 139 138 136  K 5.4*   < > 4.3  4.4 4.1 3.5 3.0* 3.2*  CL 103   < > 102 104 103 101 101  CO2 21*  --   --  23 25 27 27   GLUCOSE 137*   < > 128* 125* 144* 142* 118*  BUN 63*   < > 56* 61* 53* 51* 48*  CREATININE 2.73*   < > 2.30* 2.31* 1.77* 1.68* 1.78*  CALCIUM 8.7*  --   --  7.9* 7.8* 7.6* 7.4*  MG 2.2  --   --  2.2  --   --   1.8  PHOS  --   --   --  5.2*  --   --   --    < > = values in this interval not displayed.   GFR: Estimated Creatinine Clearance: 47.9 mL/min (A) (by C-G formula based on SCr of 1.78 mg/dL (H)). Recent Labs  Lab 02/14/20 2201 02/14/20 2225 02/15/20 0011 02/15/20 0602 02/15/20 0935 02/16/20 0316 02/17/20 0425  PROCALCITON  --   --   --  1.39  --  1.20 0.88  WBC  --  11.9*  --  11.5*  --  9.9  --   LATICACIDVEN 3.5*  --  2.9* 2.2* 2.5*  --   --     Liver Function Tests: Recent Labs  Lab 02/14/20 2110 02/15/20 0602 02/16/20 0316  AST 108* 49* 17  ALT 133* 98* 64*  ALKPHOS 208* 164* 131*  BILITOT 6.2* 4.4* 2.9*  PROT 5.4* 4.7* 4.5*  ALBUMIN 2.5* 2.1* 1.8*   No results for input(s): LIPASE, AMYLASE in the last 168 hours. No results for input(s): AMMONIA in the last 168 hours.  ABG    Component Value Date/Time   HCO3 25.7 02/14/2020 2211   TCO2 25 02/14/2020 2211   TCO2 27 02/14/2020 2211   ACIDBASEDEF 1.0 02/14/2020 2211   O2SAT 72.4 02/15/2020 1904     Coagulation Profile: Recent Labs  Lab 02/14/20 2110  INR 2.9*    Cardiac Enzymes: No results for input(s): CKTOTAL, CKMB, CKMBINDEX, TROPONINI in the last 168 hours.  HbA1C: No results found for: HGBA1C  CBG: Recent Labs  Lab 02/15/20 [place] Kirin Pastorino, MD [facility] ICU Physician [place]  Pager: [phone] Mobile: [phone] After hours: [phone].

## 2020-02-17 NOTE — Plan of Care (Signed)
  Problem: Coping: Goal: Level of anxiety will decrease Outcome: Progressing   Problem: Elimination: Goal: Will not experience complications related to urinary retention Outcome: Progressing   Problem: Pain Managment: Goal: General experience of comfort will improve Outcome: Progressing   

## 2020-02-17 NOTE — Progress Notes (Signed)
  Advanced Heart Failure Rounding Note   Subjective:    Remains on dobutamine at 5. Lasix gtt at 8.   Excellent diuresis. Weight down 7 pounds. Co-ox not drawn. Cr 2.3 -> 1.7 -> 1.8  CVP 4  More alert but feels very weak. Denies SOB. No appetite  PCT 1.2 -> 0.88 on abx  Objective:   Weight Range:  Vital Signs:   Temp:  [97.5 F (36.4 C)-98.2 F (36.8 C)] 97.5 F (36.4 C) (05/23 0751) Pulse Rate:  [28-114] 101 (05/23 0800) Resp:  [11-29] 27 (05/23 0800) BP: (84-202)/(59-184) 109/67 (05/23 0800) SpO2:  [94 %-100 %] 96 % (05/23 0800) Weight:  [109.4 kg] 109.4 kg (05/23 0500) Last BM Date: 02/16/20  Weight change: Filed Weights   02/15/20 0500 02/16/20 0500 02/17/20 0500  Weight: 114.7 kg 112.7 kg 109.4 kg    Intake/Output:   Intake/Output Summary (Last 24 hours) at 02/17/2020 1011 Last data filed at 02/17/2020 0700 Gross per 24 hour  Intake 2222.31 ml  Output 6200 ml  Net -3977.69 ml     Physical Exam: General:  Frail weak appearing. No resp difficulty HEENT: normal Neck: supple. RIJ TLC. Carotids 2+ bilat; no bruits. No lymphadenopathy or thryomegaly appreciated. Cor: PMI nondisplaced. Irregular rate & rhythm. No rubs, gallops or murmurs. Lungs: clear Abdomen: soft, nontender, nondistended. No hepatosplenomegaly. No bruits or masses. Good bowel sounds. Extremities: no cyanosis, clubbing, rash, 3+ edema Neuro: alert & orientedx3, cranial nerves grossly intact. moves all 4 extremities w/o difficulty. Affect pleasant   Telemetry:  AF 90-105 + PVCs Personally reviewed  Labs: Basic Metabolic Panel: Recent Labs  Lab 02/14/20 2110 02/14/20 2110 02/14/20 2211 02/15/20 0602 02/15/20 0602 02/16/20 0316 02/16/20 1645 02/17/20 0425  NA 140   < > 138  139 139  --  139 138 136  K 5.4*   < > 4.3  4.4 4.1  --  3.5 3.0* 3.2*  CL 103   < > 102 104  --  103 101 101  CO2 21*  --   --  23  --  25 27 27  GLUCOSE 137*   < > 128* 125*  --  144* 142* 118*  BUN 63*    < > 56* 61*  --  53* 51* 48*  CREATININE 2.73*   < > 2.30* 2.31*  --  1.77* 1.68* 1.78*  CALCIUM 8.7*   < >  --  7.9*   < > 7.8* 7.6* 7.4*  MG 2.2  --   --  2.2  --   --   --  1.8  PHOS  --   --   --  5.2*  --   --   --   --    < > = values in this interval not displayed.    Liver Function Tests: Recent Labs  Lab 02/14/20 2110 02/15/20 0602 02/16/20 0316  AST 108* 49* 17  ALT 133* 98* 64*  ALKPHOS 208* 164* 131*  BILITOT 6.2* 4.4* 2.9*  PROT 5.4* 4.7* 4.5*  ALBUMIN 2.5* 2.1* 1.8*   No results for input(s): LIPASE, AMYLASE in the last 168 hours. No results for input(s): AMMONIA in the last 168 hours.  CBC: Recent Labs  Lab 02/14/20 2211 02/14/20 2225 02/15/20 0602 02/16/20 0316  WBC  --  11.9* 11.5* 9.9  NEUTROABS  --  11.2* 10.7*  --   HGB 16.7  17.0 16.0 15.4 14.3  HCT 49.0  50.0 49.4 48.6 43.2  MCV  --    96.9 100.6* 96.0  PLT  --  143* 130* 123*    Cardiac Enzymes: No results for input(s): CKTOTAL, CKMB, CKMBINDEX, TROPONINI in the last 168 hours.  BNP: BNP (last 3 results) Recent Labs    02/14/20 0021 02/15/20 0602 02/16/20 0316  BNP 463.6* 1,408.4* 2,798.0*    ProBNP (last 3 results) No results for input(s): PROBNP in the last 8760 hours.    Other results:  Imaging: DG CHEST PORT 1 VIEW  Result Date: 02/16/2020 CLINICAL DATA:  Acute respiratory failure EXAM: PORTABLE CHEST 1 VIEW COMPARISON:  02/15/2020 FINDINGS: Cardiac shadow is enlarged but stable. Right jugular central line is noted at the cavoatrial junction. Patchy bilateral opacities are seen and stable. Small right-sided pleural effusion is seen. No pneumothorax is noted. IMPRESSION: Overall stable appearance of the chest when compared with the previous day. Electronically Signed   By: Mark  Lukens M.D.   On: 02/16/2020 08:36   DG CHEST PORT 1 VIEW  Result Date: 02/15/2020 CLINICAL DATA:  Central line placement. EXAM: PORTABLE CHEST 1 VIEW COMPARISON:  Radiographs 02/14/2020 and  02/05/2020. FINDINGS: 1850 hours. Right IJ central venous catheter projects to the level of the superior cavoatrial junction. Stable cardiomegaly and aortic atherosclerosis. Bilateral upper lobe airspace opacities have mildly progressed. There are stable small bilateral pleural effusions. No pneumothorax or acute osseous findings. IMPRESSION: 1. Right IJ central venous catheter placement without demonstrated complication. 2. Mildly progressive bilateral upper lobe airspace opacities consistent with pneumonia. Electronically Signed   By: William  Veazey M.D.   On: 02/15/2020 18:58   US EKG SITE RITE  Result Date: 02/15/2020 If Site Rite image not attached, placement could not be confirmed due to current cardiac rhythm.    Medications:     Scheduled Medications: . Chlorhexidine Gluconate Cloth  6 each Topical Daily  . feeding supplement (ENSURE ENLIVE)  237 mL Oral BID BM  . pantoprazole  40 mg Oral Daily  . sodium chloride flush  10-40 mL Intracatheter Q12H    Infusions: . amiodarone 30 mg/hr (02/17/20 0700)  . ceFEPime (MAXIPIME) IV Stopped (02/16/20 2345)  . DOBUTamine 5 mcg/kg/min (02/17/20 0700)  . furosemide (LASIX) infusion 8 mg/hr (02/17/20 0700)  . heparin 1,400 Units/hr (02/17/20 0836)    PRN Medications: docusate sodium, ipratropium-albuterol, ondansetron (ZOFRAN) IV, polyethylene glycol, sodium chloride flush   Assessment/Plan :   1. Shock, suspect Cardiogenic +/- septic - ECHO 01/2020 EF 20-25% . 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  - BCx negative. On cefepime per CCM PCT 1.2 -> 0.88 - Renal function, lactate and co-ox all improved on dobutamine. However he remains very weak.  - Awaiting bed at [facility]. Stop dobutamine on d/c  2. Acute on chronic systolic HF with biventricular dysfunction - EF 20% severe RV dysfunction - Presumed NICM  - EF did not improve with maintenance of NSR. Refused CRT for LBBB - Suspect now end-stage. Now requiring dobutamine -  CVP low but significant LE edema.  Diuresing well on lasix gtt. Will continue - Place TED hose. - End-stage. Not candidate for advanced therapies. Stop dobutamine when d/c to [facility]  3. A/C Hypoxic Respiratory Failure -CXR with R basilar infiltrates. PCT 1.2 -> 0.88 On antibiotics per CCM.  -Sats stable on 3 liters -Continue to diurese with IV lasix as tolerated  4. A Fib RVR, persistent -Had cardioversion 02/08/20 with brief conversion to NSR. He later went back in A fib.  - Remains on IV amio and heparin. Can stop   on d/c  5.  AKI -Most recent creatinine 1.48 on 02/12/20  - Improving with inotrope support. Now stable at 1.8  5  LBBB  In the past he refused CRT-D   6. Severe protein calorie malnutrition  7. Hypokalemia/Hypomag - supp  8. DNR/DNI  He is terminally ill with end-stage HF and MSOF. I had a long talk with him and he feels that he has deteriorated to the point where he can no longer take care of himself.   He has met with Palliative Care and now DNR/DNI and pending placement at [facility] which I think is appopriate.  His sister has come to visit and they are planning to have a lawyer come tomorrow to get his will and other paperwork done.    Length of Stay: 2   Daniel Bensimhon MD 02/17/2020, 10:11 AM  Advanced Heart Failure Team Pager [phone] (M-F; 7a - 4p)  Please contact [facility] for night-coverage after hours (4p -7a ) and weekends on [email or web address]  

## 2020-02-17 NOTE — Progress Notes (Signed)
ANTICOAGULATION CONSULT NOTE  Pharmacy Consult for Heparin - while apixaban on hold Indication: atrial fibrillation  Not on File  Patient Measurements: Height: 6' (182.9 cm) Weight: 109.4 kg (241 lb 2.9 oz) IBW/kg (Calculated) : 77.6 Heparin Dosing Weight: 104 kg  Vital Signs: Temp: 98.2 F (36.8 C) (05/23 0400) Temp Source: Axillary (05/23 0400) BP: 110/78 (05/23 0700) Pulse Rate: 109 (05/23 0700)  Labs: Recent Labs    02/14/20 2110 02/14/20 2211 02/14/20 2225 02/14/20 2225 02/15/20 0602 02/15/20 0602 02/16/20 0316 02/16/20 0852 02/16/20 1645 02/17/20 0425  HGB  --    < > 16.0   < > 15.4  --  14.3  --   --   --   HCT  --    < > 49.4  --  48.6  --  43.2  --   --   --   PLT  --   --  143*  --  130*  --  123*  --   --   --   APTT  --   --   --   --   --   --  52*  --  81* 60*  LABPROT 29.3*  --   --   --   --   --   --   --   --   --   INR 2.9*  --   --   --   --   --   --   --   --   --   HEPARINUNFRC  --   --   --   --   --   --  1.66*  --   --  0.87*  CREATININE 2.73*   < >  --    < > 2.31*   < > 1.77*  --  1.68* 1.78*  TROPONINIHS 31*  --   --    < > 33*  --  33* 33*  --   --    < > = values in this interval not displayed.    Estimated Creatinine Clearance: 47.9 mL/min (A) (by C-G formula based on SCr of 1.78 mg/dL (H)).   Medical History: Past Medical History:  Diagnosis Date  . A-fib (HCC)   . CHF (congestive heart failure) (HCC)   . Hypertension      Assessment: 73yom with HF EF 20% and Afib recent DCCV but now back in afib - start amiodarone drip.  Patient is acutely ill in ICU.  Will hold apixaban (LD 5/21 am) and cover with heparin drip until more stable and procedures complete.    Use aPTT to dose heparin while apixaban affecting heparin level.  aPTT this AM is slightly below goal at 60 sec. Heparin level falsely elevated 0.87 but anticipate correlation to aPTT soon.  Goal of Therapy:  Heparin level 0.3-0.7 units/ml aPTT 66-102  seconds Monitor platelets by anticoagulation protocol: Yes   Plan:  Increase heparin drip to 1400 units/hr Check 8 hour aPTT Daily aPTT, heparin level, and CBC Monitor s/s bleeding   Danae Orleans, PharmD, [facility] PGY2 Cardiology Pharmacy Resident Phone [phone] 02/17/2020       7:27 AM  Please check [email or web address] for unit-specific pharmacist phone numbers

## 2020-02-17 NOTE — Progress Notes (Signed)
Per patient, he wants to wait to stop continuous IV medications until his affairs are in line with his next of kin. His sister will be in to see him on Tuesday. Spoke with Dr. Denese Killings and got a verbal order to continue all medications.

## 2020-02-18 DIAGNOSIS — J9600 Acute respiratory failure, unspecified whether with hypoxia or hypercapnia: Secondary | ICD-10-CM

## 2020-02-18 DIAGNOSIS — J9601 Acute respiratory failure with hypoxia: Secondary | ICD-10-CM

## 2020-02-18 LAB — BASIC METABOLIC PANEL
Anion gap: 11 (ref 5–15)
BUN: 43 mg/dL — ABNORMAL HIGH (ref 8–23)
CO2: 31 mmol/L (ref 22–32)
Calcium: 7.8 mg/dL — ABNORMAL LOW (ref 8.9–10.3)
Chloride: 96 mmol/L — ABNORMAL LOW (ref 98–111)
Creatinine, Ser: 1.75 mg/dL — ABNORMAL HIGH (ref 0.61–1.24)
GFR calc Af Amer: 44 mL/min — ABNORMAL LOW (ref >60)
GFR calc non Af Amer: 38 mL/min — ABNORMAL LOW (ref >60)
Glucose, Bld: 103 mg/dL — ABNORMAL HIGH (ref 70–99)
Potassium: 3.2 mmol/L — ABNORMAL LOW (ref 3.5–5.1)
Sodium: 138 mmol/L (ref 135–145)

## 2020-02-18 LAB — CBC
HCT: 42.2 % (ref 39.0–52.0)
Hemoglobin: 13.9 g/dL (ref 13.0–17.0)
MCH: 31.4 pg (ref 26.0–34.0)
MCHC: 32.9 g/dL (ref 30.0–36.0)
MCV: 95.3 fL (ref 80.0–100.0)
Platelets: 125 10*3/uL — ABNORMAL LOW (ref 150–400)
RBC: 4.43 MIL/uL (ref 4.22–5.81)
RDW: 15 % (ref 11.5–15.5)
WBC: 7.1 10*3/uL (ref 4.0–10.5)
nRBC: 0 % (ref 0.0–0.2)

## 2020-02-18 LAB — APTT: aPTT: 103 seconds — ABNORMAL HIGH (ref 24–36)

## 2020-02-18 LAB — MAGNESIUM: Magnesium: 2 mg/dL (ref 1.7–2.4)

## 2020-02-18 LAB — HEPARIN LEVEL (UNFRACTIONATED): Heparin Unfractionated: 0.57 IU/mL (ref 0.30–0.70)

## 2020-02-18 MED ORDER — SODIUM CHLORIDE 0.9% FLUSH
10.0000 mL | Freq: Two times a day (BID) | INTRAVENOUS | Status: DC
  Administered 2020-02-20 – 2020-02-25 (×10): 10 mL

## 2020-02-18 MED ORDER — POTASSIUM CHLORIDE 10 MEQ/50ML IV SOLN
10.0000 meq | INTRAVENOUS | Status: AC
  Administered 2020-02-18 (×4): 10 meq via INTRAVENOUS
  Filled 2020-02-18 (×4): qty 50

## 2020-02-18 MED ORDER — APIXABAN 5 MG PO TABS
5.0000 mg | ORAL_TABLET | Freq: Two times a day (BID) | ORAL | Status: DC
  Administered 2020-02-18 – 2020-02-21 (×6): 5 mg via ORAL
  Filled 2020-02-18 (×7): qty 1

## 2020-02-18 MED ORDER — TORSEMIDE 20 MG PO TABS
40.0000 mg | ORAL_TABLET | Freq: Every day | ORAL | Status: DC
  Administered 2020-02-18 – 2020-02-25 (×8): 40 mg via ORAL
  Filled 2020-02-18 (×8): qty 2

## 2020-02-18 MED ORDER — POTASSIUM CHLORIDE CRYS ER 20 MEQ PO TBCR: 40.0000 meq | EXTENDED_RELEASE_TABLET | Freq: Two times a day (BID) | ORAL | Status: DC

## 2020-02-18 MED ORDER — OXYCODONE HCL 5 MG/5ML PO SOLN: 10.0000 mg | ORAL | Status: DC | PRN

## 2020-02-18 MED ORDER — MORPHINE SULFATE (PF) 2 MG/ML IV SOLN: 2.0000 mg | INTRAVENOUS | Status: DC | PRN

## 2020-02-18 MED ORDER — POTASSIUM CHLORIDE 20 MEQ/15ML (10%) PO SOLN
40.0000 meq | Freq: Two times a day (BID) | ORAL | Status: DC
  Administered 2020-02-18 (×2): 40 meq via ORAL
  Filled 2020-02-18 (×2): qty 30

## 2020-02-18 MED ORDER — APIXABAN 5 MG PO TABS: 5.0000 mg | ORAL_TABLET | Freq: Two times a day (BID) | ORAL | Status: DC

## 2020-02-18 MED ORDER — POTASSIUM CHLORIDE CRYS ER 20 MEQ PO TBCR: 20.0000 meq | EXTENDED_RELEASE_TABLET | ORAL | Status: DC

## 2020-02-18 MED ORDER — SODIUM CHLORIDE 0.9% FLUSH: 10.0000 mL | INTRAVENOUS | Status: DC | PRN

## 2020-02-18 NOTE — Progress Notes (Signed)
ANTICOAGULATION CONSULT NOTE  Pharmacy Consult for Heparin - while apixaban on hold Indication: atrial fibrillation  Not on File  Patient Measurements: Height: 6' (182.9 cm) Weight: 104 kg (229 lb 4.5 oz) IBW/kg (Calculated) : 77.6 Heparin Dosing Weight: 104 kg  Vital Signs: Temp: 97 F (36.1 C) (05/24 1117) Temp Source: Axillary (05/24 1117) BP: 109/77 (05/24 1300) Pulse Rate: 90 (05/24 1300)  Labs: Recent Labs    02/16/20 0316 02/16/20 0852 02/16/20 1645 02/17/20 0425 02/17/20 0425 02/17/20 1538 02/17/20 2023 02/18/20 0400  HGB 14.3  --   --   --   --   --   --  13.9  HCT 43.2  --   --   --   --   --   --  42.2  PLT 123*  --   --   --   --   --   --  125*  APTT 52*  --    < > 60*  --  97*  --  103*  HEPARINUNFRC 1.66*  --   --  0.87*  --   --   --  0.57  CREATININE 1.77*  --    < > 1.78*   < > 1.67* 1.77* 1.75*  TROPONINIHS 33* 33*  --   --   --   --   --   --    < > = values in this interval not displayed.    Estimated Creatinine Clearance: 47.6 mL/min (A) (by C-G formula based on SCr of 1.75 mg/dL (H)).  Assessment: 73yom with HF EF 20% and Afib recent DCCV but now back in afib - start amiodarone drip.  Patient is acutely ill in ICU.  Will hold apixaban (LD 5/21 am) and cover with heparin drip until more stable and procedures complete.    apixaban wash out heparin level within range and correlated with aptt Heparin drip 1400 units/hr HL 0.57 at goal aptt 103 sec    Goal of Therapy:  Heparin level 0.3-0.7 units/ml aPTT 66-102 seconds Monitor platelets by anticoagulation protocol: Yes   Plan:  Continue heparin drip to 1400 units/hr Daily aPTT CBC  Leota Sauers Pharm.D. CPP, BCPS Clinical Pharmacist [phone] 02/18/2020 2:01 PM     Please check AMION for all [facility] Pharmacy numbers  02/18/2020 1:59 PM

## 2020-02-18 NOTE — Plan of Care (Signed)

## 2020-02-18 NOTE — Progress Notes (Signed)
NAME:  Bryan Moran, MRN:  [id number], DOB:  Dec 08, 1945, LOS: 3 ADMISSION DATE:  02/14/2020, CONSULTATION DATE:  02/18/20 REFERRING MD:  Dr. Levora Angel, CHIEF COMPLAINT:  Hypotension   Brief History   Bryan Moran is a 74 year old Caucasian male with past medical history remarkable for chronic combined systolic/diastolic congestive heart failure last EF 25-30% with previous refusal of ICD, chronic left bundle branch block, essential hypertension, paroxysmal/persistent atrial fibrillation CKD stage III who presented with progressive shortness of breath, weakness.   Past Medical History  sCHF NYHAA Class IV CKD pAF  Significant Hospital Events   Admitted 5/20 with acute on chronic decompensated heart failure, and cardiogenic shock.  Question about possible aspiration.  Placed on broad-spectrum antibiotics, heart failure team consulted, dobutamine infusion continued. 5/21 palliative care, heart failure consulted by advanced heart failure, central access placed for titration of inotrope he and volume assessment.  Treated with diuretics.  Seen by palliative care 5/22 started on Lasix infusion.  Seen once again by palliative care, DO NOT RESUSCITATE order placed 5/23 still on dobutamine and furosemide infusions weight down 7 pounds creatinine stable was more awake but very weak.  Determined to be end-stage and not a candidate for therapies in regards to heart failure.  Plan made to discontinue inotropic and transfer to [facility].  He remained on infusions per his request as he works on getting his affairs in order Consults:  Cardiology Hospitalist > refused admission  Procedures:  None  Significant Diagnostic Tests:  Lactate 3.3 > 2.9  Micro Data:  COVID negative Cultures sent  Cefepime 5/20  Antimicrobials:  Cefepime 5/20 > pharmacy consult ordered Vacn consult ordered   Interim history/subjective:  No distress symptoms well controlled as long as he is on oxygen  Objective   Blood  pressure 94/79, pulse 84, temperature (Abnormal) 97.5 F (36.4 C), temperature source Oral, resp. rate 12, height 6' (1.829 m), weight 104 kg, SpO2 97 %. CVP:  [4 mmHg] 4 mmHg      Intake/Output Summary (Last 24 hours) at 02/18/2020 0901 Last data filed at 02/18/2020 0800 Gross per 24 hour  Intake 1956.87 ml  Output 5925 ml  Net -3968.13 ml   Filed Weights   02/16/20 0500 02/17/20 0500 02/18/20 0600  Weight: 112.7 kg 109.4 kg 104 kg    Examination:  HENT NCAT no JVD MMM. Mild JVD Pulm clear. Decreased bases.  Card  Regular irreg abd soft not tender  Ext LE edema warm brisk CR Neuro intact affect flat  GU clear yellow   Resolved Hospital Problem list   None   Assessment & Plan:   Acute decompensated heart failure with cardiogenic shock: Remains on IV dobutamine and Lasix Acute hypoxic respiratory failure secondary to pulmonary edema Persistent atrial fibrillation with RVR AKI superimposed on CKD stage 3: Secondary to decreased cardiac output and renal perfusion, serum creatinine improved Fluid and electrolyte balance: Hypokalemia Mild thrombocytopenia: Not acute  Discussion: Critically ill due to acute decompensated HFrEF, and cardiogenic shock End-stage heart failure, with biventricular dysfunction.  No further interventions possible.  Borderline Predominant right and forward failure symptoms. -Patient opting for hospice care  Plan Continuing current infusions which include: Dobutamine, IV Lasix, amiodarone and heparin, these will all be discontinued at time of discharge Continue supplemental oxygen Discontinue antibiotics, I do not think he is infected Will eventually transition to [facility] when bed available Replace potassium Add low dose narcotic   Best practice:  Diet: DAT with supplements  Pain/Anxiety/Delirium protocol (if  indicated): None VAP protocol (if indicated): None DVT prophylaxis: eliquis GI prophylaxis: home PPI Glucose control:  none Mobility: bedrest Code Status: DNR/DNI. Family Communication: transfer to ward today.   Labs   CBC: Recent Labs  Lab 02/14/20 2211 02/14/20 2225 02/15/20 0602 02/16/20 0316 02/18/20 0400  WBC  --  11.9* 11.5* 9.9 7.1  NEUTROABS  --  11.2* 10.7*  --   --   HGB 16.7  17.0 16.0 15.4 14.3 13.9  HCT 49.0  50.0 49.4 48.6 43.2 42.2  MCV  --  96.9 100.6* 96.0 95.3  PLT  --  143* 130* 123* 125*    Basic Metabolic Panel: Recent Labs  Lab 02/14/20 2110 02/14/20 2211 02/15/20 0602 02/16/20 0316 02/16/20 1645 02/17/20 0425 02/17/20 1538 02/17/20 2023 02/18/20 0400  NA 140   < > 139   < > 138 136 139 136 138  K 5.4*   < > 4.1   < > 3.0* 3.2* 3.2* 3.0* 3.2*  CL 103   < > 104   < > 101 101 99 96* 96*  CO2 21*   < > 23   < > 27 27 29 28 31   GLUCOSE 137*   < > 125*   < > 142* 118* 135* 163* 103*  BUN 63*   < > 61*   < > 51* 48* 47* 46* 43*  CREATININE 2.73*   < > 2.31*   < > 1.68* 1.78* 1.67* 1.77* 1.75*  CALCIUM 8.7*   < > 7.9*   < > 7.6* 7.4* 7.7* 7.6* 7.8*  MG 2.2  --  2.2  --   --  1.8  --  2.0 2.0  PHOS  --   --  5.2*  --   --   --   --   --   --    < > = values in this interval not displayed.   GFR: Estimated Creatinine Clearance: 47.6 mL/min (A) (by C-G formula based on SCr of 1.75 mg/dL (H)). Recent Labs  Lab 02/14/20 2201 02/14/20 2225 02/15/20 0011 02/15/20 0602 02/15/20 0935 02/16/20 0316 02/17/20 0425 02/18/20 0400  PROCALCITON  --   --   --  1.39  --  1.20 0.88  --   WBC  --  11.9*  --  11.5*  --  9.9  --  7.1  LATICACIDVEN 3.5*  --  2.9* 2.2* 2.5*  --   --   --     Liver Function Tests: Recent Labs  Lab 02/14/20 2110 02/15/20 0602 02/16/20 0316  AST 108* 49* 17  ALT 133* 98* 64*  ALKPHOS 208* 164* 131*  BILITOT 6.2* 4.4* 2.9*  PROT 5.4* 4.7* 4.5*  ALBUMIN 2.5* 2.1* 1.8*   No results for input(s): LIPASE, AMYLASE in the last 168 hours. No results for input(s): AMMONIA in the last 168 hours.  ABG    Component Value Date/Time   HCO3  25.7 02/14/2020 2211   TCO2 25 02/14/2020 2211   TCO2 27 02/14/2020 2211   ACIDBASEDEF 1.0 02/14/2020 2211   O2SAT 72.4 02/15/2020 1904     Coagulation Profile: Recent Labs  Lab 02/14/20 2110  INR 2.9*    Cardiac Enzymes: No results for input(s): CKTOTAL, CKMB, CKMBINDEX, TROPONINI in the last 168 hours.  HbA1C: No results found for: HGBA1C  CBG: Recent Labs  Lab 02/15/20 1137  GLUCAP 105*       02/17/20 ACNP-BC [facility] Pulmonary/Critical Care Pager # [phone] OR #  [phone] if no answer

## 2020-02-18 NOTE — Progress Notes (Signed)
ANTICOAGULATION CONSULT NOTE  Pharmacy Consult for Heparin - while apixaban on hold > restart apixaban 5/24 Indication: atrial fibrillation  Not on File  Patient Measurements: Height: 6' (182.9 cm) Weight: 104 kg (229 lb 4.5 oz) IBW/kg (Calculated) : 77.6 Heparin Dosing Weight: 104 kg  Vital Signs: Temp: 97 F (36.1 C) (05/24 1117) Temp Source: Axillary (05/24 1117) BP: 109/77 (05/24 1300) Pulse Rate: 90 (05/24 1300)  Labs: Recent Labs    02/16/20 0316 02/16/20 0852 02/16/20 1645 02/17/20 0425 02/17/20 0425 02/17/20 1538 02/17/20 2023 02/18/20 0400  HGB 14.3  --   --   --   --   --   --  13.9  HCT 43.2  --   --   --   --   --   --  42.2  PLT 123*  --   --   --   --   --   --  125*  APTT 52*  --    < > 60*  --  97*  --  103*  HEPARINUNFRC 1.66*  --   --  0.87*  --   --   --  0.57  CREATININE 1.77*  --    < > 1.78*   < > 1.67* 1.77* 1.75*  TROPONINIHS 33* 33*  --   --   --   --   --   --    < > = values in this interval not displayed.    Estimated Creatinine Clearance: 47.6 mL/min (A) (by C-G formula based on SCr of 1.75 mg/dL (H)).  Assessment: 73yom with HF EF 20% and Afib recent DCCV but now back in afib - start amiodarone drip.  Patient is acutely ill in ICU.  Will hold apixaban (LD 5/21 am) and cover with heparin drip until more stable and procedures complete.    apixaban wash out heparin level within range and correlated with aptt Heparin drip 1400 units/hr HL 0.57 at goal aptt 103 sec    Goal of Therapy:  Heparin level 0.3-0.7 units/ml aPTT 66-102 seconds Monitor platelets by anticoagulation protocol: Yes   Plan:  Continue heparin drip to 1400 units/hr Daily aPTT CBC  Leota Sauers Pharm.D. CPP, BCPS Clinical Pharmacist [phone] 02/18/2020 2:34 PM     Please check AMION for all [facility] Pharmacy numbers  02/18/2020 2:34 PM   PM addendum Stop heparin drip this evening  Resume Apixaban 5mg  BID - dose ok for age/weight/renal function   Pharm.D. CPP, BCPS Clinical Pharmacist [phone] 02/18/2020 2:35 PM

## 2020-02-18 NOTE — Progress Notes (Signed)
[facility] Liaison note.  Notified by Mariana Arn, NP with Palliative Medical Team, patient would like to wait on transfer to [facility] until after family arrives to get his affairs in order.   Hospital liaison will continue to follow and coordinate for [facility] transfer when appropriate based on bed availability.   Please call with any hospice related questions.  Thank you,  Elsie Saas, RN, [facility] Liaison [phone]

## 2020-02-18 NOTE — Progress Notes (Signed)
Palliative Medicine RN Note: Chart check. Per PMT report, pt is waiting on a bed at [facility]. However, upon chart review, patient needs to get his affairs in order (which requires family, who will be here tomorrow 5/25). Therefore, Dr Denese Killings agreed yesterday to restart IV therapies until that time.  I updated the BP liaison [place] of the delay.  We will continue to watch for discharge plans/concerns. GOC are clear.  Margret Chance Makyia Erxleben, RN, BSN, [facility] Palliative Medicine Team 02/18/2020 9:25 AM Office [phone]

## 2020-02-18 NOTE — Progress Notes (Signed)
HPI: Per intake H&P -->Bryan Moran a 74 year old Caucasian male with past medical history remarkable for chronic combined systolic/diastolic congestive heart failure last EF 25-30% with previous refusal of ICD, chronic left bundle branch block, essential hypertension, paroxysmal/persistent atrial fibrillation CKD stage III who presented with progressive shortness of breath, weakness.  Palliative care was asked to get involved in the setting of multiple hospitalizations.The plan had been to transition to Hospice inpatient at [facility] when a bed became available. His BP has required Dobutamine support, currently at 3 mcg/kg/min. He will remain in ICU for now.   Today's discussion (02/18/20): Chart reviewed. I met with [name] at bedside. He again shared that he had no idea his heart failure was so severe. He expressed tiredness but has enjoyed a life well lived. We talked about his plans for Tuesday with his sister visiting  and getting his affairs in order with the lawyer. A friend was arriving to visit as I left. He denies pain at this time.  I discussed current medication regimen with nurse who is aware of above plans per patient.  Decision Maker: Patient can make decisions for himself  Objective: Vital signs in last 24 hours: Temp:  [96.3 F (35.7 C)-97.8 F (36.6 C)] 96.3 F (35.7 C) (05/24 1528) Pulse Rate:  [31-102] 50 (05/24 1600) Resp:  [10-36] 21 (05/24 1600) BP: (87-125)/(54-79) 100/59 (05/24 1600) SpO2:  [93 %-99 %] 95 % (05/24 1600) Weight:  [104 kg] 104 kg (05/24 0600) Wt Readings from Last 1 Encounters:  02/18/20 104 kg     Medications: of note he is on Dobutamine at 3 mcg/min  Summary of recommendations  DNR/DNI documented in chart Honor patient's wishes once he has met with sister and lawyer on 5/25 Palliative Medicine is available to help manage any symptoms associated with weaning of dobutamine. Assess for bed at [facility]  if appropriate after weaning  medications.  Symptom Management: Pain: Morphine prn and oxycodone prn (Nurse states he has not taken any) Time spent 30 minutes  Greater than 50% of the time was spent in counseling and coordination of care. Donalynn Furlong 02/18/2020, 4:56 PM   [facility] Palliative Medicine Team Team Cell Phone: [phone] Please utilize secure chat with additional questions, if there is no response within 30 minutes please call the above phone number  Palliative Medicine Team providers are available by phone from 7am to 7pm daily and can be reached through the team cell phone. Should this patient require assistance outside of these hours, please call the patient's attending physician.

## 2020-02-18 NOTE — Progress Notes (Addendum)
Advanced Heart Failure Rounding Note   Subjective:    Remains on dobutamine at 5. Lasix gtt at 8.   Continues to diurese. Weight down 41 pounds total.   Co-ox not drawn. Cr stable at 1.8  CVP 3  SBP in 90s  PCT 1.2 -> 0.88 on abx  Objective:   Weight Range:  Vital Signs:   Temp:  [97.1 F (36.2 C)-97.9 F (36.6 C)] 97.5 F (36.4 C) (05/24 0742) Pulse Rate:  [31-102] 84 (05/24 0800) Resp:  [9-36] 12 (05/24 0800) BP: (87-125)/(54-93) 94/79 (05/24 0800) SpO2:  [93 %-100 %] 97 % (05/24 0800) Weight:  [104 kg] 104 kg (05/24 0600) Last BM Date: 02/16/20  Weight change: Filed Weights   02/16/20 0500 02/17/20 0500 02/18/20 0600  Weight: 112.7 kg 109.4 kg 104 kg    Intake/Output:   Intake/Output Summary (Last 24 hours) at 02/18/2020 0940 Last data filed at 02/18/2020 0800 Gross per 24 hour  Intake 1956.87 ml  Output 5925 ml  Net -3968.13 ml     Physical Exam: General:  Frail weak appearing. No resp difficulty HEENT: normal Neck: supple.RIJ TLC. No JVD  Carotids 2+ bilat; no bruits. No lymphadenopathy or thryomegaly appreciated. Cor: PMI nondisplaced. Irreg +s3 Lungs: clear Abdomen: soft, nontender, nondistended. No hepatosplenomegaly. No bruits or masses. Good bowel sounds. Extremities: no cyanosis, clubbing, rash, tr-1+ edema + TED hose Neuro: alert & orientedx3, cranial nerves grossly intact. moves all 4 extremities w/o difficulty. Affect pleasant   Telemetry:  AF 80-90s + PVCs Personally reviewed  Labs: Basic Metabolic Panel: Recent Labs  Lab 02/14/20 2110 02/14/20 2211 02/15/20 0602 02/16/20 0316 02/16/20 1645 02/16/20 1645 02/17/20 0425 02/17/20 0425 02/17/20 1538 02/17/20 2023 02/18/20 0400  NA 140   < > 139   < > 138  --  136  --  139 136 138  K 5.4*   < > 4.1   < > 3.0*  --  3.2*  --  3.2* 3.0* 3.2*  CL 103   < > 104   < > 101  --  101  --  99 96* 96*  CO2 21*   < > 23   < > 27  --  27  --  _0 GLUCOSE 137*   < > 125*   < > 142*   --  118*  --  135* 163* 103*  BUN 63*   < > 61*   < > 51*  --  48*  --  47* 46* 43*  CREATININE 2.73*   < > 2.31*   < > 1.68*  --  1.78*  --  1.67* 1.77* 1.75*  CALCIUM 8.7*   < > 7.9*   < > 7.6*   < > 7.4*   < > 7.7* 7.6* 7.8*  MG 2.2  --  2.2  --   --   --  1.8  --   --  2.0 2.0  PHOS  --   --  5.2*  --   --   --   --   --   --   --   --    < > = values in this interval not displayed.    Liver Function Tests: Recent Labs  Lab 02/14/20 2110 02/15/20 0602 02/16/20 0316  AST 108* 49* 17  ALT 133* 98* 64*  ALKPHOS 208* 164* 131*  BILITOT 6.2* 4.4* 2.9*  PROT 5.4* 4.7* 4.5*  ALBUMIN 2.5* 2.1* 1.8*  No results for input(s): LIPASE, AMYLASE in the last 168 hours. No results for input(s): AMMONIA in the last 168 hours.  CBC: Recent Labs  Lab 02/14/20 2211 02/14/20 2225 02/15/20 0602 02/16/20 0316 02/18/20 0400  WBC  --  11.9* 11.5* 9.9 7.1  NEUTROABS  --  11.2* 10.7*  --   --   HGB 16.7  17.0 16.0 15.4 14.3 13.9  HCT 49.0  50.0 49.4 48.6 43.2 42.2  MCV  --  96.9 100.6* 96.0 95.3  PLT  --  143* 130* 123* 125*    Cardiac Enzymes: No results for input(s): CKTOTAL, CKMB, CKMBINDEX, TROPONINI in the last 168 hours.  BNP: BNP (last 3 results) Recent Labs    02/14/20 0021 02/15/20 0602 02/16/20 0316  BNP 463.6* 1,408.4* 2,798.0*    ProBNP (last 3 results) No results for input(s): PROBNP in the last 8760 hours.    Other results:  Imaging: No results found.   Medications:     Scheduled Medications: . carvedilol  3.125 mg Oral BID  . Chlorhexidine Gluconate Cloth  6 each Topical Daily  . feeding supplement (ENSURE ENLIVE)  237 mL Oral BID BM  . lisinopril  40 mg Oral Daily  . sodium chloride flush  10-40 mL Intracatheter Q12H  . torsemide  20 mg Oral BID  . triamcinolone cream  1 application Topical Daily    Infusions: . amiodarone 30 mg/hr (02/18/20 0800)  . DOBUTamine 5 mcg/kg/min (02/18/20 0800)  . furosemide (LASIX) infusion 8 mg/hr (02/17/20  1946)  . heparin 1,400 Units/hr (02/18/20 0800)    PRN Medications: docusate sodium, ipratropium-albuterol, ondansetron (ZOFRAN) IV, sodium chloride flush   Assessment/Plan :   1. Shock, suspect Cardiogenic +/- septic - ECHO 01/2020 EF 20-25% . 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  - BCx negative. On cefepime per CCM PCT 1.2 -> 0.88 - Renal function, lactate and co-ox all improved on dobutamine. However he remains very weak.  - Awaiting bed at [facility]. Stop dobutamine on d/c - No change  2. Acute on chronic systolic HF with biventricular dysfunction - EF 20% severe RV dysfunction - Presumed NICM  - EF did not improve with maintenance of NSR. Refused CRT for LBBB - Suspect now end-stage. Now requiring dobutamine - Has diuresed 41 pounds. CVP 3. Will change to oral diuretics - Continue TED hose. - End-stage. Not candidate for advanced therapies. Stop dobutamine when d/c to [facility]  3. A/C Hypoxic Respiratory Failure -CXR with R basilar infiltrates. PCT 1.2 -> 0.88 On antibiotics per CCM.  -Sats stable on 3 liters -Improved with diuresis  4. A Fib RVR, persistent -Had cardioversion 02/08/20 with brief conversion to NSR. He later went back in A fib.  - Remains on IV amio and heparin. Can stop on d/c  5.  AKI -Most recent creatinine 1.48 on 02/12/20  - Improving with inotrope support. Now stable at 1.7-1.8  5  LBBB  In the past he refused CRT-D   6. Severe protein calorie malnutrition  7. Hypokalemia/Hypomag - supp  8. DNR/DNI  He is terminally ill with end-stage HF and MSOF. I had a long talk with him and he feels that he has deteriorated to the point where he can no longer take care of himself.   He has met with Palliative Care and now DNR/DNI and pending placement at [facility] which I think is appopriate.  His sister has come to visit and they are planning to have a lawyer come  tomorrow to get his will and other paperwork done.   D/w CCM at  bedside.   Length of Stay: 3   Glori Bickers MD 02/18/2020, 9:40 AM  Advanced Heart Failure Team Pager [phone] (M-F; 7a - 4p)  Please contact [facility] for night-coverage after hours (4p -7a ) and weekends on [email or web address]

## 2020-02-19 LAB — BASIC METABOLIC PANEL
Anion gap: 11 (ref 5–15)
BUN: 38 mg/dL — ABNORMAL HIGH (ref 8–23)
CO2: 34 mmol/L — ABNORMAL HIGH (ref 22–32)
Calcium: 7.8 mg/dL — ABNORMAL LOW (ref 8.9–10.3)
Chloride: 92 mmol/L — ABNORMAL LOW (ref 98–111)
Creatinine, Ser: 1.69 mg/dL — ABNORMAL HIGH (ref 0.61–1.24)
GFR calc Af Amer: 46 mL/min — ABNORMAL LOW (ref >60)
GFR calc non Af Amer: 40 mL/min — ABNORMAL LOW (ref >60)
Glucose, Bld: 108 mg/dL — ABNORMAL HIGH (ref 70–99)
Potassium: 3.2 mmol/L — ABNORMAL LOW (ref 3.5–5.1)
Sodium: 137 mmol/L (ref 135–145)

## 2020-02-19 LAB — CBC
HCT: 41.8 % (ref 39.0–52.0)
Hemoglobin: 14 g/dL (ref 13.0–17.0)
MCH: 32 pg (ref 26.0–34.0)
MCHC: 33.5 g/dL (ref 30.0–36.0)
MCV: 95.4 fL (ref 80.0–100.0)
Platelets: 136 10*3/uL — ABNORMAL LOW (ref 150–400)
RBC: 4.38 MIL/uL (ref 4.22–5.81)
RDW: 15 % (ref 11.5–15.5)
WBC: 6.4 10*3/uL (ref 4.0–10.5)
nRBC: 0 % (ref 0.0–0.2)

## 2020-02-19 LAB — CULTURE, BLOOD (ROUTINE X 2)
Culture: NO GROWTH
Culture: NO GROWTH

## 2020-02-19 MED ORDER — POTASSIUM CHLORIDE 20 MEQ/15ML (10%) PO SOLN
40.0000 meq | Freq: Three times a day (TID) | ORAL | Status: AC
  Administered 2020-02-19 (×2): 40 meq via ORAL
  Filled 2020-02-19 (×2): qty 30

## 2020-02-19 MED ORDER — BISACODYL 5 MG PO TBEC
5.0000 mg | DELAYED_RELEASE_TABLET | Freq: Every day | ORAL | Status: DC | PRN
  Administered 2020-02-19: 5 mg via ORAL
  Filled 2020-02-19: qty 1

## 2020-02-19 MED ORDER — MAGNESIUM HYDROXIDE 400 MG/5ML PO SUSP
30.0000 mL | Freq: Every day | ORAL | Status: DC | PRN
  Administered 2020-02-19: 30 mL via ORAL
  Filled 2020-02-19: qty 30

## 2020-02-19 MED ORDER — FLEET ENEMA 7-19 GM/118ML RE ENEM
1.0000 | ENEMA | Freq: Every day | RECTAL | Status: DC | PRN
  Filled 2020-02-19: qty 1

## 2020-02-19 MED ORDER — SENNOSIDES-DOCUSATE SODIUM 8.6-50 MG PO TABS
3.0000 | ORAL_TABLET | Freq: Every day | ORAL | Status: DC
  Administered 2020-02-20 – 2020-02-24 (×5): 3 via ORAL
  Filled 2020-02-19 (×6): qty 3

## 2020-02-19 NOTE — Progress Notes (Signed)
Palliative:  HPI: Bryan Moran a 74 year old Caucasian male with past medical history remarkable for chronic combined systolic/diastolic congestive heart failure last EF 25-30% with previous refusal of ICD, chronic left bundle branch block, essential hypertension, paroxysmal/persistent atrial fibrillation CKD stage III who presented with progressive shortness of breath, weakness. He continues on dobutamine and amiodarone infusions without any significant improvement and very poor quality of life.   I met today with Bryan Moran but no family at bedside. He was pleased that he was able to work with attorney today to complete documentation to settle his affairs. We discussed next steps. He did not seem to recall past conversations with providers about what the plan is moving forward. He does have good understanding that he has very severe heart disease and we discussed the expectation moving forward and concern for overall poor quality of life. He tells me that he understands and that he is "ready to go." He would like to discuss the details of next steps when his sister, Bryan Moran, comes tomorrow. We did discuss weaning off IV infusions and utilizing medications for comfort as well as transition to hospice facility. Again, we plan to discuss this further tomorrow with support of family at bedside. RN reports that sister is planning to be here tomorrow 02/20/20 ~1130 am.   All questions/concerns addressed. Emotional support provided.   Exam: Alert, oriented but forgetful. No distress. Overall fatigue and weakness. HR irregular afib. Breathing regular, unlabored. Abd flat.    Plan: - Meeting with family tomorrow 5/26 ~1130 to discuss transition to comfort care per patient wishes.   25 min  Vinie Sill, NP Palliative Medicine Team Pager [phone] (Please see [email or web address] for schedule) Team Phone [phone]    Greater than 50%  of this time was spent counseling and coordinating care related to the above  assessment and plan

## 2020-02-19 NOTE — Progress Notes (Addendum)
NAME:  Bryan Moran, MRN:  [id number], DOB:  [birth year], LOS: 4 ADMISSION DATE:  02/14/2020, CONSULTATION DATE:  02/19/20 REFERRING MD:  Dr. Fernanda Drum, CHIEF COMPLAINT:  Hypotension   Brief History   Bryan Moran is a 74 year old Caucasian male with past medical history remarkable for chronic combined systolic/diastolic congestive heart failure last EF 25-30% with previous refusal of ICD, chronic left bundle branch block, essential hypertension, paroxysmal/persistent atrial fibrillation CKD stage III who presented with progressive shortness of breath, weakness.   Past Medical History  sCHF NYHAA Class IV CKD pAF  Significant Hospital Events   Admitted 5/20 with acute on chronic decompensated heart failure, and cardiogenic shock.  Question about possible aspiration.  Placed on broad-spectrum antibiotics, heart failure team consulted, dobutamine infusion continued. 5/21 palliative care, heart failure consulted by advanced heart failure, central access placed for titration of inotrope he and volume assessment.  Treated with diuretics.  Seen by palliative care 5/22 started on Lasix infusion.  Seen once again by palliative care, DO NOT RESUSCITATE order placed 5/23 still on dobutamine and furosemide infusions weight down 7 pounds creatinine stable was more awake but very weak.  Determined to be end-stage and not a candidate for therapies in regards to heart failure.  Plan made to discontinue inotropic and transfer to [facility].  He remained on infusions per his request as he works on getting his affairs in order 5/24 IV lasix and IV heparin stopped  Consults:  Cardiology Hospitalist > refused admission  Procedures:  None  Significant Diagnostic Tests:  Lactate 3.3 > 2.9  Micro Data:  COVID negative Cultures sent  Cefepime 5/20  Antimicrobials:  Cefepime 5/20 > pharmacy consult ordered Vacn consult ordered   Interim history/subjective:  Feels constipated  Objective   Blood pressure  123/83, pulse 85, temperature (Abnormal) 97.5 F (36.4 C), temperature source Oral, resp. rate 12, height 6' (1.829 m), weight 104 kg, SpO2 94 %.        Intake/Output Summary (Last 24 hours) at 02/19/2020 0845 Last data filed at 02/19/2020 0700 Gross per 24 hour  Intake 1131.17 ml  Output 5190 ml  Net -4058.83 ml   Filed Weights   02/16/20 0500 02/17/20 0500 02/18/20 0600  Weight: 112.7 kg 109.4 kg 104 kg    Examination:  General this is a 74 year old white male. He is resting in chair. Appetite is poor. Does feel constipated.  HENT NCAT pupils are Equal and reactive, sclera are non icteric  pulm faint crackles bases. No accessory use  Card reg irreg af on tele. Currently w/ CVR abd soft not tender but is distended.  Ext dependent edema. Pulses palp Neuro awake and oriented. Affect flat gu voids   Resolved Hospital Problem list   None   Assessment & Plan:   Acute decompensated heart failure with cardiogenic shock: Remains on IV dobutamine and Lasix Acute hypoxic respiratory failure secondary to pulmonary edema Persistent atrial fibrillation with RVR AKI superimposed on CKD stage 3: Secondary to decreased cardiac output and renal perfusion, serum creatinine improved Fluid and electrolyte balance: Hypokalemia Mild thrombocytopenia: Not acute Constipation   Discussion: Critically ill due to acute decompensated HFrEF, and cardiogenic shock End-stage heart failure, with biventricular dysfunction.  No further interventions possible.  Borderline Predominant right and forward failure symptoms. -Patient opting for hospice care -appears comfortable  Plan Cont dobutamine and amiodarone. Plan is to stop this at dc Cont supplemental oxygen Replace K PRN narcotics Dc to [facility] once he has all  of his affairs in order.  Add LOC   Best practice:  Diet: DAT with supplements  Pain/Anxiety/Delirium protocol (if indicated): None VAP protocol (if indicated): None DVT  prophylaxis: eliquis GI prophylaxis: home PPI Glucose control: none Mobility: bedrest Code Status: DNR/DNI. Family Communication: transfer to ward today.   Labs   CBC: Recent Labs  Lab 02/14/20 2225 02/15/20 0602 02/16/20 0316 02/18/20 0400 02/19/20 0348  WBC 11.9* 11.5* 9.9 7.1 6.4  NEUTROABS 11.2* 10.7*  --   --   --   HGB 16.0 15.4 14.3 13.9 14.0  HCT 49.4 48.6 43.2 42.2 41.8  MCV 96.9 100.6* 96.0 95.3 95.4  PLT 143* 130* 123* 125* 136*    Basic Metabolic Panel: Recent Labs  Lab 02/14/20 2110 02/14/20 2211 02/15/20 0602 02/16/20 0316 02/17/20 0425 02/17/20 1538 02/17/20 2023 02/18/20 0400 02/19/20 0348  NA 140   < > 139   < > 136 139 136 138 137  K 5.4*   < > 4.1   < > 3.2* 3.2* 3.0* 3.2* 3.2*  CL 103   < > 104   < > 101 99 96* 96* 92*  CO2 21*   < > 23   < > 27 29 28 31  34*  GLUCOSE 137*   < > 125*   < > 118* 135* 163* 103* 108*  BUN 63*   < > 61*   < > 48* 47* 46* 43* 38*  CREATININE 2.73*   < > 2.31*   < > 1.78* 1.67* 1.77* 1.75* 1.69*  CALCIUM 8.7*   < > 7.9*   < > 7.4* 7.7* 7.6* 7.8* 7.8*  MG 2.2  --  2.2  --  1.8  --  2.0 2.0  --   PHOS  --   --  5.2*  --   --   --   --   --   --    < > = values in this interval not displayed.   GFR: Estimated Creatinine Clearance: 49.3 mL/min (A) (by C-G formula based on SCr of 1.69 mg/dL (H)). Recent Labs  Lab 02/14/20 2201 02/14/20 2225 02/15/20 0011 02/15/20 0602 02/15/20 0935 02/16/20 0316 02/17/20 0425 02/18/20 0400 02/19/20 0348  PROCALCITON  --   --   --  1.39  --  1.20 0.88  --   --   WBC  --    < >  --  11.5*  --  9.9  --  7.1 6.4  LATICACIDVEN 3.5*  --  2.9* 2.2* 2.5*  --   --   --   --    < > = values in this interval not displayed.    Liver Function Tests: Recent Labs  Lab 02/14/20 2110 02/15/20 0602 02/16/20 0316  AST 108* 49* 17  ALT 133* 98* 64*  ALKPHOS 208* 164* 131*  BILITOT 6.2* 4.4* 2.9*  PROT 5.4* 4.7* 4.5*  ALBUMIN 2.5* 2.1* 1.8*   No results for input(s): LIPASE, AMYLASE  in the last 168 hours. No results for input(s): AMMONIA in the last 168 hours.  ABG    Component Value Date/Time   HCO3 25.7 02/14/2020 2211   TCO2 25 02/14/2020 2211   TCO2 27 02/14/2020 2211   ACIDBASEDEF 1.0 02/14/2020 2211   O2SAT 72.4 02/15/2020 1904     Coagulation Profile: Recent Labs  Lab 02/14/20 2110  INR 2.9*    Cardiac Enzymes: No results for input(s): CKTOTAL, CKMB, CKMBINDEX, TROPONINI in the last 168 hours.  HbA1C:  No results found for: HGBA1C  CBG: Recent Labs  Lab 02/15/20 1137  GLUCAP 105*       Simonne Martinet ACNP-BC [facility] Pulmonary/Critical Care Pager # [phone] OR # [phone] if no answer

## 2020-02-19 NOTE — Progress Notes (Signed)
[facility] Liaison note.  Notified by Melanie Henslee, NP with Palliative Medical Team, patient would like to wait on transfer to [facility] until after family arrives to get his affairs in order.   Hospital liaison will continue to follow and coordinate for [facility] transfer when appropriate based on bed availability.   Please call with any hospice related questions.  Thank you,  Mary Anne Robertson, RN, CCM [facility] Liaison [phone]   

## 2020-02-19 NOTE — Significant Event (Signed)
Patient's lawyer and his two friends are at the bedside now completing legal paperwork. Patient is comfortable; has not voice any complaints today.   RN called and spoke with his sister Bryan Moran to clarify when she would be here as patient stated yesterday, that sister would be here today.   Per patient's sister Bryan Moran, she would be here tomorrow 02/20/2020 at 11:30am along with her spouse.    Bryan Moran

## 2020-02-19 NOTE — Progress Notes (Signed)
Palliative Medicine RN Note: Rec'd a call from patient's friend Donne Anon, who is coming up shortly before 1130 with pt's atty Amy Erick Colace and another friend/witness Naaman Plummer so Mr Stolar can complete his will. I cleared the visitation with his RN, and I called Nettie Elm at the front desk to let her know.  Bryan Chance Nayelis Bonito, RN, BSN, [facility] Palliative Medicine Team 02/19/2020 10:54 AM Office [phone]

## 2020-02-19 NOTE — Progress Notes (Addendum)
Advanced Heart Failure Rounding Note   Subjective:    Remains on dobutamine at 5 and amiodarone gtt 30 mg/hr.   Good diuresis. Weight down 40 lb overall. Off IV Lasix now on torsemide.   Feels tired and weak. Poor appetite. No resting dyspnea.   Objective:   Weight Range:  Vital Signs:   Temp:  [96.3 F (35.7 C)-97.8 F (36.6 C)] 97.5 F (36.4 C) (05/25 0752) Pulse Rate:  [34-105] 85 (05/25 0700) Resp:  [10-30] 12 (05/25 0700) BP: (95-132)/(59-114) 123/83 (05/25 0700) SpO2:  [87 %-99 %] 94 % (05/25 0700) Last BM Date: 02/16/20  Weight change: Filed Weights   02/16/20 0500 02/17/20 0500 02/18/20 0600  Weight: 112.7 kg 109.4 kg 104 kg    Intake/Output:   Intake/Output Summary (Last 24 hours) at 02/19/2020 1610 Last data filed at 02/19/2020 0700 Gross per 24 hour  Intake 1131.17 ml  Output 5190 ml  Net -4058.83 ml     Physical Exam:  General:  Fatigue appearing elderly WM, sitting up in chair No resp difficulty HEENT: normal Neck: supple. + RIJ TLC.  No JVD  Carotids 2+ bilat; no bruits. No lymphadenopathy or thryomegaly appreciated. Cor: PMI nondisplaced. irregularly irregular rhythm, mildly tachy rate Lungs: clear Abdomen: soft, nontender, nondistended. No hepatosplenomegaly. No bruits or masses. Good bowel sounds. Extremities: no cyanosis, clubbing, rash, trace bilateral LE edema + TED hose Neuro: alert & orientedx3, cranial nerves grossly intact. moves all 4 extremities w/o difficulty. Affect pleasant   Telemetry:  AF 80-90s 3 beat runs of NSVT Personally reviewed  Labs: Basic Metabolic Panel: Recent Labs  Lab 02/14/20 2110 02/14/20 2211 02/15/20 0602 02/16/20 0316 02/17/20 0425 02/17/20 0425 02/17/20 1538 02/17/20 1538 02/17/20 2023 02/18/20 0400 02/19/20 0348  NA 140   < > 139   < > 136  --  139  --  136 138 137  K 5.4*   < > 4.1   < > 3.2*  --  3.2*  --  3.0* 3.2* 3.2*  CL 103   < > 104   < > 101  --  99  --  96* 96* 92*  CO2 21*   < >  23   < > 27  --  29  --  28 31 34*  GLUCOSE 137*   < > 125*   < > 118*  --  135*  --  163* 103* 108*  BUN 63*   < > 61*   < > 48*  --  47*  --  46* 43* 38*  CREATININE 2.73*   < > 2.31*   < > 1.78*  --  1.67*  --  1.77* 1.75* 1.69*  CALCIUM 8.7*   < > 7.9*   < > 7.4*   < > 7.7*   < > 7.6* 7.8* 7.8*  MG 2.2  --  2.2  --  1.8  --   --   --  2.0 2.0  --   PHOS  --   --  5.2*  --   --   --   --   --   --   --   --    < > = values in this interval not displayed.    Liver Function Tests: Recent Labs  Lab 02/14/20 2110 02/15/20 0602 02/16/20 0316  AST 108* 49* 17  ALT 133* 98* 64*  ALKPHOS 208* 164* 131*  BILITOT 6.2* 4.4* 2.9*  PROT 5.4* 4.7* 4.5*  ALBUMIN  2.5* 2.1* 1.8*   No results for input(s): LIPASE, AMYLASE in the last 168 hours. No results for input(s): AMMONIA in the last 168 hours.  CBC: Recent Labs  Lab 02/14/20 2225 02/15/20 0602 02/16/20 0316 02/18/20 0400 02/19/20 0348  WBC 11.9* 11.5* 9.9 7.1 6.4  NEUTROABS 11.2* 10.7*  --   --   --   HGB 16.0 15.4 14.3 13.9 14.0  HCT 49.4 48.6 43.2 42.2 41.8  MCV 96.9 100.6* 96.0 95.3 95.4  PLT 143* 130* 123* 125* 136*    Cardiac Enzymes: No results for input(s): CKTOTAL, CKMB, CKMBINDEX, TROPONINI in the last 168 hours.  BNP: BNP (last 3 results) Recent Labs    02/14/20 0021 02/15/20 0602 02/16/20 0316  BNP 463.6* 1,408.4* 2,798.0*    ProBNP (last 3 results) No results for input(s): PROBNP in the last 8760 hours.    Other results:  Imaging: No results found.   Medications:     Scheduled Medications: . apixaban  5 mg Oral BID  . carvedilol  3.125 mg Oral BID  . Chlorhexidine Gluconate Cloth  6 each Topical Daily  . feeding supplement (ENSURE ENLIVE)  237 mL Oral BID BM  . lisinopril  40 mg Oral Daily  . potassium chloride  40 mEq Oral BID  . sodium chloride flush  10-40 mL Intracatheter Q12H  . torsemide  40 mg Oral Daily  . triamcinolone cream  1 application Topical Daily    Infusions: .  amiodarone 30 mg/hr (02/19/20 0700)  . DOBUTamine 5 mcg/kg/min (02/19/20 0700)    PRN Medications: docusate sodium, ipratropium-albuterol, morphine injection, ondansetron (ZOFRAN) IV, oxyCODONE, sodium chloride flush   Assessment/Plan :   1. Shock, suspect Cardiogenic +/- septic - ECHO 01/2020 EF 20-25% . 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  - BCx negative. On cefepime per CCM PCT 1.2 -> 0.88 - Renal function, lactate and co-ox all improved on dobutamine. However he remains very weak.  - Awaiting bed at [facility]. Stop dobutamine on d/c - No change  2. Acute on chronic systolic HF with biventricular dysfunction - EF 20% severe RV dysfunction - Presumed NICM  - EF did not improve with maintenance of NSR. Refused CRT for LBBB - Suspect now end-stage. Now requiring dobutamine - Has diuresed 41 pounds. Volume status stable on oral diuretics - Continue TED hose. - End-stage. Not candidate for advanced therapies. Stop dobutamine when d/c to [facility]  3. A/C Hypoxic Respiratory Failure -CXR with R basilar infiltrates. PCT 1.2 -> 0.88 On antibiotics per CCM.  -Sats stable on 3 liters -Improved with diuresis  4. A Fib RVR, persistent -Had cardioversion 02/08/20 with brief conversion to NSR. He later went back in A fib.  - Remains on IV amio and heparin. Can stop on d/c  5.  AKI -Most recent creatinine 1.48 on 02/12/20  - Improving with inotrope support. Now stable at 1.7-1.8  5  LBBB  - In the past he refused CRT-D   6. Severe protein calorie malnutrition  7. Hypokalemia/Hypomag - K 3.2 - supp. KCl 40 mEq bid   8. Goals of Care -  DNR/DNI - He is terminally ill with end-stage HF and MSOF. I had a long talk with him and he feels that he has deteriorated to the point where he can no longer take care of himself.  - He has met with Palliative Care and now DNR/DNI and pending placement at [facility] which I think is appopriate. - His sister has  come to visit and  they are planning to have a lawyer come today to get his will and other paperwork done.    Length of Stay: [place]  PA-C 02/19/2020, 8:12 AM  Advanced Heart Failure Team Pager [phone] (M-F; 7a - 4p)  Please contact [facility] for night-coverage after hours (4p -7a ) and weekends on [email or web address]   Patient seen and examined with the above-signed Advanced Practice Provider and/or Housestaff. I personally reviewed laboratory data, imaging studies and relevant notes. I independently examined the patient and formulated the important aspects of the plan. I have edited the note to reflect any of my changes or salient points. I have personally discussed the plan with the patient and/or family.  Remains on dobutamine. Little change. Volume status improved. Remains in AF now with rate control.   = General:  Elderly frail . No resp difficulty HEENT: normal Neck: supple. no JVD. Carotids 2+ bilat; no bruits. No lymphadenopathy or thryomegaly appreciated. Cor: PMI nondisplaced. Irregular rate & rhythm. No rubs, gallops or murmurs. Lungs: clear Abdomen: soft, nontender, nondistended. No hepatosplenomegaly. No bruits or masses. Good bowel sounds. Extremities: no cyanosis, clubbing, rash, tr edema Neuro: alert & orientedx3, cranial nerves grossly intact. moves all 4 extremities w/o difficulty. Affect pleasant  Pending d/c to [facility] later this week. Would stop DBA at time of discharge but continue diuretic.    We will sign off. Please call with questions  Glori Bickers, MD  9:53 AM

## 2020-02-20 LAB — CBC
HCT: 41.5 % (ref 39.0–52.0)
Hemoglobin: 13.7 g/dL (ref 13.0–17.0)
MCH: 31.9 pg (ref 26.0–34.0)
MCHC: 33 g/dL (ref 30.0–36.0)
MCV: 96.5 fL (ref 80.0–100.0)
Platelets: 142 10*3/uL — ABNORMAL LOW (ref 150–400)
RBC: 4.3 MIL/uL (ref 4.22–5.81)
RDW: 15 % (ref 11.5–15.5)
WBC: 7.1 10*3/uL (ref 4.0–10.5)
nRBC: 0 % (ref 0.0–0.2)

## 2020-02-20 LAB — CULTURE, BLOOD (ROUTINE X 2)
Culture: NO GROWTH
Culture: NO GROWTH
Special Requests: ADEQUATE

## 2020-02-20 MED ORDER — AMIODARONE HCL 200 MG PO TABS
200.0000 mg | ORAL_TABLET | Freq: Every day | ORAL | Status: DC
  Administered 2020-02-20 – 2020-02-25 (×6): 200 mg via ORAL
  Filled 2020-02-20 (×6): qty 1

## 2020-02-20 MED ORDER — POTASSIUM CHLORIDE 20 MEQ/15ML (10%) PO SOLN
40.0000 meq | Freq: Every day | ORAL | Status: AC
  Administered 2020-02-20 – 2020-02-21 (×2): 40 meq via ORAL
  Filled 2020-02-20 (×2): qty 30

## 2020-02-20 MED ORDER — MORPHINE SULFATE (PF) 2 MG/ML IV SOLN: 2.0000 mg | INTRAVENOUS | Status: DC | PRN

## 2020-02-20 NOTE — Progress Notes (Signed)
[facility] Liaison note:  Hospital liaison will continue to follow and coordinate for [facility] transfer when appropriate based on bed availability.  TOC Revonda Standard made aware.  Please call with any hospice related questions.  Thank you for the opportunity to participate in this pt's care.  Gillian Scarce, BSN, [facility] [employer] (in [place]- hospice) [phone] (24h on call)

## 2020-02-20 NOTE — Progress Notes (Signed)
Patient to go to 2c while final hospice arrangements made.  [facility] to assume care 5/26; if patient to be dc'd 5/26 we can take care of it.  Myrla Halsted MD PCCM

## 2020-02-20 NOTE — Progress Notes (Addendum)
Palliative:  HPI: Bryan Moran a 74 year old Caucasian male with past medical history remarkable for chronic combined systolic/diastolic congestive heart failure last EF 25-30% with previous refusal of ICD, chronic left bundle branch block, essential hypertension, paroxysmal/persistent atrial fibrillation CKD stage III who presented with progressive shortness of breath, weakness. He continues on dobutamine and amiodarone infusions without any significant improvement and very poor quality of life.    I met today with Aadil along with his sister Peggye Fothergill, brother-in-law BJ, and friend Gershon Mussel. We had a discussion today and revisited previous conversations with aggressive care and interventions that have stabilized him hemodynamically but he continues without any quality of life. He does not want life prolonged without quality. We discussed next step of planning to turn off infusions and then transition to hospice facility for ongoing comfort care.   We discussed the details of comfort care and use of medications. [name] is nervous about using morphine and I spent some time explaining why and how morphine is used to provide relief from shortness of breath. She is still nervous and would like to be contacted if morphine begins to be utilized and I explained this to nurse.   Regginald spends time reminiscing about his loving family and time spent with his family in [place] growing up and the close friends made in [place] that have become family to him Gershon Mussel included).   All questions/concerns addressed. Emotional support provided.   Exam: Alert, oriented but somewhat forgetful. No distress. Overall fatigue and weakness. HR irregular afib. Breathing regular, unlabored. Abd distended.   Plan: - Hopeful for transition to [facility] 5/27 if bed available. Dobutamine to be d/c prior to transfer but continue for now.  - Call sister, Peggye Fothergill, if morphine being given for comfort. Morphine is ordered prn.  - Harlin and  sister, Peggye Fothergill, would like to speak with cardiology if possible. Reiss expressed concern that I am the first to explain his poor prognosis and would like to also hear from cardiology regarding this as well (I do believe this has been discussed from heart failure team previously as documented in their notes - he does seem forgetful of conversation details from day to day).   [place], NP Palliative Medicine Team Pager [phone] (Please see [email or web address] for schedule) Team Phone [phone]    Greater than 50%  of this time was spent counseling and coordinating care related to the above assessment and plan

## 2020-02-20 NOTE — Progress Notes (Signed)
NAME:  Bryan Moran, MRN:  [id number], DOB:  [birth year], LOS: 5 ADMISSION DATE:  02/14/2020, CONSULTATION DATE:  02/20/20 REFERRING MD:  Dr. Fernanda Drum, CHIEF COMPLAINT:  Hypotension   Brief History   Bryan Moran is a 74 year old Caucasian male with past medical history remarkable for chronic combined systolic/diastolic congestive heart failure last EF 25-30% with previous refusal of ICD, chronic left bundle branch block, essential hypertension, paroxysmal/persistent atrial fibrillation CKD stage III who presented with progressive shortness of breath, weakness.   Past Medical History  sCHF NYHAA Class IV CKD pAF  Significant Hospital Events   Admitted 5/20 with acute on chronic decompensated heart failure, and cardiogenic shock.  Question about possible aspiration.  Placed on broad-spectrum antibiotics, heart failure team consulted, dobutamine infusion continued. 5/21 palliative care, heart failure consulted by advanced heart failure, central access placed for titration of inotrope he and volume assessment.  Treated with diuretics.  Seen by palliative care 5/22 started on Lasix infusion.  Seen once again by palliative care, DO NOT RESUSCITATE order placed 5/23 still on dobutamine and furosemide infusions weight down 7 pounds creatinine stable was more awake but very weak.  Determined to be end-stage and not a candidate for therapies in regards to heart failure.  Plan made to discontinue inotropic and transfer to [facility].  He remained on infusions per his request as he works on getting his affairs in order 5/24 IV lasix and IV heparin stopped  Consults:  Cardiology Hospitalist > refused admission  Procedures:  None  Significant Diagnostic Tests:  Lactate 3.3 > 2.9  Micro Data:  COVID negative Cultures sent  Cefepime 5/20  Antimicrobials:  Cefepime 5/20 > pharmacy consult ordered Vacn consult ordered   Interim history/subjective:  Feels weak  Objective   Blood pressure 105/89,  pulse 86, temperature 97.9 F (36.6 C), temperature source Oral, resp. rate 17, height 6' (1.829 m), weight 104 kg, SpO2 92 %.        Intake/Output Summary (Last 24 hours) at 02/20/2020 0904 Last data filed at 02/20/2020 0200 Gross per 24 hour  Intake 498.21 ml  Output 2625 ml  Net -2126.79 ml   Filed Weights   02/16/20 0500 02/17/20 0500 02/18/20 0600  Weight: 112.7 kg 109.4 kg 104 kg    Examination: General this is a 74 year old white male. He is up in chair. Not in acute distress HENT NCAT no JVD MMM  Pulm decreased bases. Remains on 2 lpm  Card Regular irregular  abd soft not tender +  Ext warm brisk CR lower edema noted.  Neuro intact affect flat   Resolved Hospital Problem list   None   Assessment & Plan:   Acute decompensated heart failure with cardiogenic shock: Remains on IV dobutamine and Lasix Acute hypoxic respiratory failure secondary to pulmonary edema Persistent atrial fibrillation with RVR AKI superimposed on CKD stage 3: Secondary to decreased cardiac output and renal perfusion, serum creatinine improved Fluid and electrolyte balance: Hypokalemia Mild thrombocytopenia: Not acute Constipation   Discussion: Critically ill due to acute decompensated HFrEF, and cardiogenic shock End-stage heart failure, with biventricular dysfunction.  No further interventions possible.  Borderline Predominant right and forward failure symptoms. -Patient opting for hospice care -appears comfortable  Plan Cont dobutamine-->DC at discharge  Change back to amiodarone 200 po daily. No reason to keep this IV Cont supplemental oxygen Cont potassium replacement  LOC PRN PRN morphine  [facility] at [employer]:  Diet: DAT with supplements  Pain/Anxiety/Delirium  protocol (if indicated): None VAP protocol (if indicated): None DVT prophylaxis: eliquis GI prophylaxis: home PPI Glucose control: none Mobility: bedrest Code Status: DNR/DNI. Family Communication:  transfer to ward today.  I spoke to his sister. Verified she is on way today      Erick Colace ACNP-BC [place] Pager # [phone] OR # [phone] if no answer

## 2020-02-21 ENCOUNTER — Telehealth (HOSPITAL_COMMUNITY): Payer: Self-pay

## 2020-02-21 DIAGNOSIS — I509 Heart failure, unspecified: Secondary | ICD-10-CM

## 2020-02-21 NOTE — Progress Notes (Signed)
Manufacturing systems engineer [facility]) Hospital Liaison note.     Notified by Yong Channel, NP that patient has taken care of getting his affairs in order. [facility] does not have a room today. [facility] liaison spoke with patient's sister [name] to update. [name] states she will be returning Saturday morning to visit and is currently happy that the patient will remain in the hospital another day.   Hospital liaison will continue to follow the patient and notify [facility] and family when [facility] is able to offer a room.   A Please do not hesitate to call with questions.     Thank you,    Elsie Saas, RN, [facility]       [facility] Liaison (listed on AMION under Hospice /[facility])     [phone]

## 2020-02-21 NOTE — Progress Notes (Signed)
Palliative:  MKL:KJZP [name] a 74 year old Caucasian male with past medical history remarkable for chronic combined systolic/diastolic congestive heart failure last EF 25-30% with previous refusal of ICD, chronic left bundle branch block, essential hypertension, paroxysmal/persistent atrial fibrillation CKD stage III who presented with progressive shortness of breath, weakness.He continues on dobutamine and amiodarone infusions without any significant improvement and very poor quality of life.  I met today with Bryan Moran and he is feeling well and had a good night. He has no pain or shortness of breath or any other complaints except for weakness and poor appetite.   I called sister, Bryan Moran, and we discussed that Bryan Moran is a little forgetful at times and that cardiology has had many discussions with him. I reviewed details of heart failure and events and issues of hospitalization with Bryan Moran. We discussed prognosis as poor days to a week without dobutamine. She plans to return Saturday to visit with him again. She is in discussions with [facility] for anticipated transition over the next few days. She asked that I return again today to reassure Bryan Moran that we are not giving up on him but that his heart is too weak our options are so limited to help him improve. I do feel that he knows this but will return to provide reassurance. She also had a question about his insurance and Pensions consultant is requested and I notified Bryan Palmer, RN liaison to address this question.   I met again with Bryan Moran and explained his sister's concern that he feels we are just giving up on him and he further explains that he knows this is not the case at all. He is sitting in recliner and has dinner tray in front of him. He is trying to force himself to eat but says he has no appetite. He also feels very poorly currently and is worried that he will die very soon but denies any pain or shortness of breath. Struggles to clarify why he  feels so bad but seems to be combination of weakness, fatigue, and poor appetite. Plans to have bath and then return to bed. Discussed that getting out of bed may just be too much for him at this stage and he agrees this may not be great to repeat.   All questions/concerns addressed. Emotional support provided.   Exam: Alert, oriented but sometimes forgetful. HR afib 80s. Breathing regular, unlabored. Abd distended. Generalized weakness.   Plan: - D/C dobutamine prior to transfer to [facility].  - Sister Bryan Moran wishes to be notified if he requires morphine for comfort.  - Minimize medications/pills for comfort per patient request during discussion today.   [place], NP Palliative Medicine Team Pager [phone] (Please see [email or web address] for schedule) Team Phone [phone]    Greater than 50%  of this time was spent counseling and coordinating care related to the above assessment and plan

## 2020-02-21 NOTE — Progress Notes (Signed)
PROGRESS NOTE    Bryan Moran  XBD:[id number] DOB: 12/27/1945 DOA: 02/14/2020 PCP: Patient, No Pcp Per   Brief Narrative:  HPI On 02/15/2020 by Dr. Rodrigo Ran (PCCM) 73y M w PMHx of ICM, sCHF NYHAA Class IV, chronic LBBB, pAF on AC, CKD III presenting for shortness of breath and fatigue. Patient evaluated in the ER and noted to be hypotnesive, given 1L bolus. Patient is poor historian, aggravated and yelling "let me sleep I'm fine" then doses back off, history obtained from chart review. Patient adamantly refuses any symptoms, however uncertain of baseline mental status.  Assessment & Plan   Acute decompensated heart failure with cardiogenic shock  -Patient was initially admitted to ICU -Patient has end-stage heart failure with biventricular dysfunction -Currently on IV dobutamine -which will be discontinued upon discharge -Continue demadex -Patient is opting for hospice care -Palliative care consulted and appreciated  Acute hypoxic respiratory failure secondary pulmonary edema -Improved, continue supplemental oxygen  Acute kidney injury on chronic kidney disease, stage IIIb -Secondary to decreased cardiac output and renal perfusion -Creatinine was up to 2.7 on admission -Creatinine improving, now down to 1.6-1.7-suspect this is his baseline  Persistent atrial fibrillation with RVR -Improved -Continue amiodarone, Coreg Eliquis  Hypokalemia -No labs drawn this morning  Mild thrombocytopenia -Platelets have been improving  Constipation -Patient has been able to have a bowel movement -continue Senokot-S  DVT Prophylaxis  Eliquis  Code Status: DNR  Family Communication: None at bedside  Disposition Plan:  Status is: Inpatient  Remains inpatient appropriate because:Hemodynamically unstable   Dispo: The patient is from: Home              Anticipated d/c is to: [facility]              Anticipated d/c date is: 2 days              Patient currently is not medically  stable to d/c.  Consultants PCCM Palliative care  Procedures  None  Antibiotics   Anti-infectives (From admission, onward)   Start     Dose/Rate Route Frequency Ordered Stop   02/16/20 0600  vancomycin (VANCOREADY) IVPB 1250 mg/250 mL  Status:  Discontinued     1,250 mg 166.7 mL/hr over 90 Minutes Intravenous Every 24 hours 02/15/20 0252 02/16/20 0828   02/15/20 1200  ceFEPIme (MAXIPIME) 2 g in sodium chloride 0.9 % 100 mL IVPB  Status:  Discontinued     2 g 200 mL/hr over 30 Minutes Intravenous Every 12 hours 02/15/20 0252 02/17/20 1603   02/15/20 0015  ceFEPIme (MAXIPIME) 2 g in sodium chloride 0.9 % 100 mL IVPB     2 g 200 mL/hr over 30 Minutes Intravenous  Once 02/15/20 0009 02/15/20 0145   02/15/20 0015  metroNIDAZOLE (FLAGYL) IVPB 500 mg     500 mg 100 mL/hr over 60 Minutes Intravenous  Once 02/15/20 0009 02/15/20 0145   02/15/20 0015  vancomycin (VANCOCIN) IVPB 1000 mg/200 mL premix  Status:  Discontinued     1,000 mg 200 mL/hr over 60 Minutes Intravenous  Once 02/15/20 0009 02/15/20 0010   02/15/20 0015  vancomycin (VANCOREADY) IVPB 2000 mg/400 mL     2,000 mg 200 mL/hr over 120 Minutes Intravenous  Once 02/15/20 0010 02/15/20 0355      Subjective:   Willim Johannsen seen and examined today. Patient complains of feeling weak. States he is doing as well as can be expected. No current chest pain, shortness of breath, abdominal pain, N/V.  Objective:   Vitals:   02/21/20 0800 02/21/20 0838 02/21/20 1110 02/21/20 1117  BP:  (!) 83/54 100/62   Pulse: 93  83 83  Resp: 19  12 18   Temp:   98.2 F (36.8 C)   TempSrc:   Oral   SpO2: 93%  94% 93%  Weight:      Height:        Intake/Output Summary (Last 24 hours) at 02/21/2020 1402 Last data filed at 02/21/2020 3151 Gross per 24 hour  Intake 129.38 ml  Output 1275 ml  Net -1145.62 ml   Filed Weights   02/16/20 0500 02/17/20 0500 02/18/20 0600  Weight: 112.7 kg 109.4 kg 104 kg    Exam  General: Well  developed,chronically ill appearing  HEENT: NCAT, mucous membranes moist.   Cardiovascular: S1 S2 auscultated, irregular  Respiratory: Diminished breath sounds at the bases  Abdomen: Soft, nontender, nondistended, + bowel sounds  Extremities: warm dry without cyanosis clubbing. LE edema  Neuro: AAOx3, nonfocal  Psych: Normal affect and demeanor with intact judgement and insight   Data Reviewed: I have personally reviewed following labs and imaging studies  CBC: Recent Labs  Lab 02/14/20 2225 02/14/20 2225 02/15/20 0602 02/16/20 0316 02/18/20 0400 02/19/20 0348 02/20/20 0455  WBC 11.9*   < > 11.5* 9.9 7.1 6.4 7.1  NEUTROABS 11.2*  --  10.7*  --   --   --   --   HGB 16.0   < > 15.4 14.3 13.9 14.0 13.7  HCT 49.4   < > 48.6 43.2 42.2 41.8 41.5  MCV 96.9   < > 100.6* 96.0 95.3 95.4 96.5  PLT 143*   < > 130* 123* 125* 136* 142*   < > = values in this interval not displayed.   Basic Metabolic Panel: Recent Labs  Lab 02/14/20 2110 02/14/20 2211 02/15/20 0602 02/16/20 0316 02/17/20 0425 02/17/20 1538 02/17/20 2023 02/18/20 0400 02/19/20 0348  NA 140   < > 139   < > 136 139 136 138 137  K 5.4*   < > 4.1   < > 3.2* 3.2* 3.0* 3.2* 3.2*  CL 103   < > 104   < > 101 99 96* 96* 92*  CO2 21*   < > 23   < > 27 29 28 31  34*  GLUCOSE 137*   < > 125*   < > 118* 135* 163* 103* 108*  BUN 63*   < > 61*   < > 48* 47* 46* 43* 38*  CREATININE 2.73*   < > 2.31*   < > 1.78* 1.67* 1.77* 1.75* 1.69*  CALCIUM 8.7*   < > 7.9*   < > 7.4* 7.7* 7.6* 7.8* 7.8*  MG 2.2  --  2.2  --  1.8  --  2.0 2.0  --   PHOS  --   --  5.2*  --   --   --   --   --   --    < > = values in this interval not displayed.   GFR: Estimated Creatinine Clearance: 49.3 mL/min (A) (by C-G formula based on SCr of 1.69 mg/dL (H)). Liver Function Tests: Recent Labs  Lab 02/14/20 2110 02/15/20 0602 02/16/20 0316  AST 108* 49* 17  ALT 133* 98* 64*  ALKPHOS 208* 164* 131*  BILITOT 6.2* 4.4* 2.9*  PROT 5.4* 4.7*  4.5*  ALBUMIN 2.5* 2.1* 1.8*   No results for input(s): LIPASE, AMYLASE in the last  168 hours. No results for input(s): AMMONIA in the last 168 hours. Coagulation Profile: Recent Labs  Lab 02/14/20 2110  INR 2.9*   Cardiac Enzymes: No results for input(s): CKTOTAL, CKMB, CKMBINDEX, TROPONINI in the last 168 hours. BNP (last 3 results) No results for input(s): PROBNP in the last 8760 hours. HbA1C: No results for input(s): HGBA1C in the last 72 hours. CBG: Recent Labs  Lab 02/15/20 1137  GLUCAP 105*   Lipid Profile: No results for input(s): CHOL, HDL, LDLCALC, TRIG, CHOLHDL, LDLDIRECT in the last 72 hours. Thyroid Function Tests: No results for input(s): TSH, T4TOTAL, FREET4, T3FREE, THYROIDAB in the last 72 hours. Anemia Panel: No results for input(s): VITAMINB12, FOLATE, FERRITIN, TIBC, IRON, RETICCTPCT in the last 72 hours. Urine analysis:    Component Value Date/Time   COLORURINE AMBER (A) 02/14/2020 2248   APPEARANCEUR HAZY (A) 02/14/2020 2248   LABSPEC 1.015 02/14/2020 2248   PHURINE 5.0 02/14/2020 2248   GLUCOSEU NEGATIVE 02/14/2020 2248   HGBUR SMALL (A) 02/14/2020 2248   BILIRUBINUR NEGATIVE 02/14/2020 2248   KETONESUR NEGATIVE 02/14/2020 2248   PROTEINUR NEGATIVE 02/14/2020 2248   NITRITE NEGATIVE 02/14/2020 2248   LEUKOCYTESUR NEGATIVE 02/14/2020 2248   Sepsis Labs: @LABRCNTIP (procalcitonin:4,lacticidven:4)  ) Recent Results (from the past 240 hour(s))  SARS Coronavirus 2 by RT PCR (hospital order, performed in [facility] hospital lab) Nasopharyngeal Nasopharyngeal Swab     Status: None   Collection Time: 02/14/20  9:11 PM   Specimen: Nasopharyngeal Swab  Result Value Ref Range Status   SARS Coronavirus 2 NEGATIVE NEGATIVE Final    Comment: (NOTE) SARS-CoV-2 target nucleic acids are NOT DETECTED. The SARS-CoV-2 RNA is generally detectable in upper and lower respiratory specimens during the acute phase of infection. The lowest concentration of  SARS-CoV-2 viral copies this assay can detect is 250 copies / mL. A negative result does not preclude SARS-CoV-2 infection and should not be used as the sole basis for treatment or other patient management decisions.  A negative result may occur with improper specimen collection / handling, submission of specimen other than nasopharyngeal swab, presence of viral mutation(s) within the areas targeted by this assay, and inadequate number of viral copies (<250 copies / mL). A negative result must be combined with clinical observations, patient history, and epidemiological information. Fact Sheet for Patients:   02/16/20 Fact Sheet for Healthcare Providers: BoilerBrush.com.cy This test is not yet approved or cleared  by the https://pope.com/ FDA and has been authorized for detection and/or diagnosis of SARS-CoV-2 by FDA under an Emergency Use Authorization (EUA).  This EUA will remain in effect (meaning this test can be used) for the duration of the COVID-19 declaration under Section 564(b)(1) of the Act, 21 U.S.C. section 360bbb-3(b)(1), unless the authorization is terminated or revoked sooner. Performed at [facility], [address], [place], [place]   Blood culture (routine x 2)     Status: None   Collection Time: 02/14/20  9:59 PM   Specimen: BLOOD  Result Value Ref Range Status   Specimen Description BLOOD LEFT ARM  Final   Special Requests   Final    BOTTLES DRAWN AEROBIC AND ANAEROBIC Blood Culture results may not be optimal due to an inadequate volume of blood received in culture bottles   Culture   Final    NO GROWTH 5 DAYS Performed at [facility], [address] [place], [place]    Report Status 02/19/2020 FINAL  Final  Blood culture (routine x 2)     Status: None   Collection Time: 02/14/20  9:59 PM   Specimen: BLOOD  Result Value Ref Range Status   Specimen Description BLOOD RIGHT  HAND  Final   Special Requests   Final    BOTTLES DRAWN AEROBIC AND ANAEROBIC Blood Culture results may not be optimal due to an inadequate volume of blood received in culture bottles   Culture   Final    NO GROWTH 5 DAYS Performed at [facility], [address], [place], [place] [address]    Report Status 02/19/2020 FINAL  Final  MRSA PCR Screening     Status: None   Collection Time: 02/15/20  5:08 AM   Specimen: Nasal Mucosa; Nasopharyngeal  Result Value Ref Range Status   MRSA by PCR NEGATIVE NEGATIVE Final    Comment:        The GeneXpert MRSA Assay (FDA approved for NASAL specimens only), is one component of a comprehensive MRSA colonization surveillance program. It is not intended to diagnose MRSA infection nor to guide or monitor treatment for MRSA infections. Performed at [facility], [address], [place], [place] [address]   Culture, blood (routine x 2)     Status: None   Collection Time: 02/15/20  6:02 AM   Specimen: BLOOD  Result Value Ref Range Status   Specimen Description BLOOD LEFT ARM  Final   Special Requests   Final    BOTTLES DRAWN AEROBIC AND ANAEROBIC Blood Culture adequate volume   Culture   Final    NO GROWTH 5 DAYS Performed at [facility], [address], [place], [place] [address]    Report Status 02/20/2020 FINAL  Final  Culture, blood (routine x 2)     Status: None   Collection Time: 02/15/20  6:11 AM   Specimen: BLOOD  Result Value Ref Range Status   Specimen Description BLOOD LEFT HAND  Final   Special Requests   Final    BOTTLES DRAWN AEROBIC ONLY Blood Culture results may not be optimal due to an inadequate volume of blood received in culture bottles   Culture   Final    NO GROWTH 5 DAYS Performed at [facility], [address] [place], [place] [address]    Report Status 02/20/2020 FINAL  Final      Radiology Studies: No results found.   Scheduled Meds: . amiodarone  200 mg Oral Daily  . apixaban   5 mg Oral BID  . carvedilol  3.125 mg Oral BID  . Chlorhexidine Gluconate Cloth  6 each Topical Daily  . feeding supplement (ENSURE ENLIVE)  237 mL Oral BID BM  . lisinopril  40 mg Oral Daily  . senna-docusate  3 tablet Oral QHS  . sodium chloride flush  10-40 mL Intracatheter Q12H  . torsemide  40 mg Oral Daily  . triamcinolone cream  1 application Topical Daily   Continuous Infusions: . DOBUTamine 5 mcg/kg/min (02/21/20 0657)     LOS: 6 days   Time Spent in minutes   45 minutes  Aixa Corsello D.O. on 02/21/2020 at 2:02 PM  Between 7am to 7pm - Please see pager noted on [email or web address]  After 7pm go to [email or web address]  And look for the night coverage person covering for me after hours  [facility] Office  [phone]

## 2020-02-21 NOTE — Telephone Encounter (Signed)
Heart Failure Stewardship Pharmacist  Transitions of Care Follow-up Call   PCP: Burton Apley, MD PCP-Cardiologist: Tobias Alexander, MD    HPI and Hospital Course:  74 yo M with PMH significant for paroxysmal afib (not on anticoagulation secondary to GI bleed), chronic systolic heart failure (previously refused ICD), chronic LBBB, and hypertensive heart disease. Admitted on 02/05/20 with afib with RVR. ECHO from March 2016 with LVEF 25-30%. LVEF now further reduced to 20-25% on ECHO from 02/06/20. His is s/p DCCV on 5/14 but flipped back in afib. Amiodarone was increased. Plan was to repeat DCCV in a few weeks after discharge. He was discharged on 5/19 from [facility].  *Patient was unable to be reached after 3 call attempts*  Pertinent Lab Values: . Serum creatinine 1.48, BUN 34, Potassium 3.9, Sodium 140, BNP 789.9, Magnesium 1.9  HF Medications: Furosemide 40 mg BID KCl 20 mEq daily Carvedilol 3.125 mg BID   Medication Assistance / Insurance Benefits Check: Does the patient have prescription insurance? Yes Type of insurance plan: commercial insurance - BCBS  Does the patient qualify for medication assistance through manufacturers or grants? Yes   Eligible grants and/or patient assistance programs: pending HF regimen  Medication assistance applications in progress: None  Medication assistance applications approved: None  Approved medication assistance renewals will be completed by: Dr. Lindaann Slough office  Assessment: 1. Chronic systolic CHF (EF 43-32%). NYHA class II/III symptoms. - Continue furosemide 40 mg PO BID - Continue KCl 20 mEq daily - Continue carvedilol 3.125 mg BID - Consider restarting low dose ARB pending improvement in BP at follow up visit.   Plan: 1) Medication changes recommended at this time: - Consider restarting low dose ARB pending improvement in BP at follow up visit. - Follow up on 02/27/20 with Ronie Spies, PA-C  2) Patient assistance  application(s): - None pending  Danae Orleans, PharmD, BCPS HF Stewardship Pharmacist Phone [phone] 02/21/2020       11:15 AM

## 2020-02-21 NOTE — Plan of Care (Signed)

## 2020-02-21 NOTE — Plan of Care (Signed)
  Problem: Education: Goal: Knowledge of General Education information will improve Description: Including pain rating scale, medication(s)/side effects and non-pharmacologic comfort measures Outcome: Progressing   Problem: Nutrition: Goal: Adequate nutrition will be maintained Outcome: Progressing   Problem: Coping: Goal: Level of anxiety will decrease Outcome: Progressing   Problem: Elimination: Goal: Will not experience complications related to bowel motility Outcome: Progressing   Problem: Pain Managment: Goal: General experience of comfort will improve Outcome: Progressing   Problem: Safety: Goal: Ability to remain free from injury will improve Outcome: Progressing   Problem: Skin Integrity: Goal: Risk for impaired skin integrity will decrease Outcome: Progressing   

## 2020-02-22 NOTE — Progress Notes (Signed)
Nutrition Brief Note  Patient identified on the Malnutrition Screening Tool (MST) Report  Patient awaiting bed placement at [facility] residential hospice. Continue meals as tolerated. No nutrition interventions warranted at this time. If nutrition issues arise, please consult RD.   Vanessa Kick RD, LDN Clinical Nutrition Pager listed in AMION

## 2020-02-22 NOTE — Social Work (Signed)
Spoke to Aubrey with [facility] who reports no hospice home bed available at [facility] or [facility] campuses. Possible bed available over weekend, f/u with Victorino Dike [phone]. Will continue to follow.   Dellie Burns, MSW, LCSW [phone] (coverage)

## 2020-02-22 NOTE — Progress Notes (Signed)
Civil engineer, contracting [facility])  [facility] does not have any availability today.  Will update pt and his sister.  TOC manager aware.  Hopeful to have a bed this weekend.   As noted by PMT note, dobutamine will need to be stopped prior to transferring to [facility] when there is a bed available.  Wallis Bamberg RN, BSN, CCRN [facility] Liaison

## 2020-02-22 NOTE — Plan of Care (Signed)

## 2020-02-22 NOTE — Plan of Care (Signed)

## 2020-02-22 NOTE — Progress Notes (Signed)
PROGRESS NOTE    Bryan Moran  CVE:[id number] DOB: [birth year] DOA: 02/14/2020 PCP: Patient, No Pcp Per   Brief Narrative:  HPI On 02/15/2020 by Dr. Rodrigo Ran (PCCM) 73y M w PMHx of ICM, sCHF NYHAA Class IV, chronic LBBB, pAF on AC, CKD III presenting for shortness of breath and fatigue. Patient evaluated in the ER and noted to be hypotnesive, given 1L bolus. Patient is poor historian, aggravated and yelling "let me sleep I'm fine" then doses back off, history obtained from chart review. Patient adamantly refuses any symptoms, however uncertain of baseline mental status.  Assessment & Plan   Acute decompensated heart failure with cardiogenic shock  -Patient was initially admitted to ICU -Patient has end-stage heart failure with biventricular dysfunction -Currently on IV dobutamine -which will be discontinued upon discharge -Continue demadex -Patient is opting for hospice care -Palliative care consulted and appreciated  Acute hypoxic respiratory failure secondary pulmonary edema -Improved, continue supplemental oxygen  Acute kidney injury on chronic kidney disease, stage IIIb -Secondary to decreased cardiac output and renal perfusion -Creatinine was up to 2.7 on admission -Creatinine improving, now down to 1.6-1.7-suspect this is his baseline  Persistent atrial fibrillation with RVR -Improved -Continue amiodarone, Coreg Eliquis  Hypokalemia -No labs drawn this morning  Mild thrombocytopenia -Platelets have been improving  Constipation -Patient has been able to have a bowel movement -continue Senokot-S  DVT Prophylaxis  Eliquis  Code Status: DNR  Family Communication: None at bedside  Disposition Plan:  Status is: Inpatient  Remains inpatient appropriate because:Hemodynamically unstable and pending residential hospice bed.    Dispo: The patient is from: Home              Anticipated d/c is to: [facility]  Or residential hospice              Anticipated d/c date  is: 2 days              Patient currently is not medically stable to d/c.  Consultants PCCM Palliative care  Procedures  None  Antibiotics   Anti-infectives (From admission, onward)   Start     Dose/Rate Route Frequency Ordered Stop   02/16/20 0600  vancomycin (VANCOREADY) IVPB 1250 mg/250 mL  Status:  Discontinued     1,250 mg 166.7 mL/hr over 90 Minutes Intravenous Every 24 hours 02/15/20 0252 02/16/20 0828   02/15/20 1200  ceFEPIme (MAXIPIME) 2 g in sodium chloride 0.9 % 100 mL IVPB  Status:  Discontinued     2 g 200 mL/hr over 30 Minutes Intravenous Every 12 hours 02/15/20 0252 02/17/20 1603   02/15/20 0015  ceFEPIme (MAXIPIME) 2 g in sodium chloride 0.9 % 100 mL IVPB     2 g 200 mL/hr over 30 Minutes Intravenous  Once 02/15/20 0009 02/15/20 0145   02/15/20 0015  metroNIDAZOLE (FLAGYL) IVPB 500 mg     500 mg 100 mL/hr over 60 Minutes Intravenous  Once 02/15/20 0009 02/15/20 0145   02/15/20 0015  vancomycin (VANCOCIN) IVPB 1000 mg/200 mL premix  Status:  Discontinued     1,000 mg 200 mL/hr over 60 Minutes Intravenous  Once 02/15/20 0009 02/15/20 0010   02/15/20 0015  vancomycin (VANCOREADY) IVPB 2000 mg/400 mL     2,000 mg 200 mL/hr over 120 Minutes Intravenous  Once 02/15/20 0010 02/15/20 0355      Subjective:   Bryan Moran seen and examined today.  Complains of feeling weak.  States he has no complaints of chest pain or  shortness of breath, abdominal pain, nausea or vomiting this morning.  Is trying to figure out exactly how much time he has left. Objective:   Vitals:   02/22/20 0006 02/22/20 0300 02/22/20 0321 02/22/20 0739  BP: 93/65   102/78  Pulse: 95 83  91  Resp:  13  19  Temp: 97.6 F (36.4 C)  97.6 F (36.4 C) (!) 97.5 F (36.4 C)  TempSrc: Oral   Oral  SpO2:  95%  95%  Weight:      Height:        Intake/Output Summary (Last 24 hours) at 02/22/2020 1026 Last data filed at 02/22/2020 0945 Gross per 24 hour  Intake 220.49 ml  Output 1950 ml  Net  -1729.51 ml   Filed Weights   02/16/20 0500 02/17/20 0500 02/18/20 0600  Weight: 112.7 kg 109.4 kg 104 kg   Exam  General: Well developed, chronically ill-appearing, NAD  HEENT: NCAT, mucous membranes moist.   Cardiovascular: S1 S2 auscultated, iregular  Respiratory: Diminished breath sounds  Abdomen: Soft, nontender, nondistended, + bowel sounds  Extremities: warm dry without cyanosis clubbing. LE edema  Neuro: AAOx3, nonfocal  Psych: Appropriate mood and affect however question his ability to process current information.   Data Reviewed: I have personally reviewed following labs and imaging studies  CBC: Recent Labs  Lab 02/16/20 0316 02/18/20 0400 02/19/20 0348 02/20/20 0455  WBC 9.9 7.1 6.4 7.1  HGB 14.3 13.9 14.0 13.7  HCT 43.2 42.2 41.8 41.5  MCV 96.0 95.3 95.4 96.5  PLT 123* 125* 136* 287*   Basic Metabolic Panel: Recent Labs  Lab 02/17/20 0425 02/17/20 1538 02/17/20 2023 02/18/20 0400 02/19/20 0348  NA 136 139 136 138 137  K 3.2* 3.2* 3.0* 3.2* 3.2*  CL 101 99 96* 96* 92*  CO2 27 29 28 31  34*  GLUCOSE 118* 135* 163* 103* 108*  BUN 48* 47* 46* 43* 38*  CREATININE 1.78* 1.67* 1.77* 1.75* 1.69*  CALCIUM 7.4* 7.7* 7.6* 7.8* 7.8*  MG 1.8  --  2.0 2.0  --    GFR: Estimated Creatinine Clearance: 49.3 mL/min (A) (by C-G formula based on SCr of 1.69 mg/dL (H)). Liver Function Tests: Recent Labs  Lab 02/16/20 0316  AST 17  ALT 64*  ALKPHOS 131*  BILITOT 2.9*  PROT 4.5*  ALBUMIN 1.8*   No results for input(s): LIPASE, AMYLASE in the last 168 hours. No results for input(s): AMMONIA in the last 168 hours. Coagulation Profile: No results for input(s): INR, PROTIME in the last 168 hours. Cardiac Enzymes: No results for input(s): CKTOTAL, CKMB, CKMBINDEX, TROPONINI in the last 168 hours. BNP (last 3 results) No results for input(s): PROBNP in the last 8760 hours. HbA1C: No results for input(s): HGBA1C in the last 72 hours. CBG: Recent Labs   Lab 02/15/20 1137  GLUCAP 105*   Lipid Profile: No results for input(s): CHOL, HDL, LDLCALC, TRIG, CHOLHDL, LDLDIRECT in the last 72 hours. Thyroid Function Tests: No results for input(s): TSH, T4TOTAL, FREET4, T3FREE, THYROIDAB in the last 72 hours. Anemia Panel: No results for input(s): VITAMINB12, FOLATE, FERRITIN, TIBC, IRON, RETICCTPCT in the last 72 hours. Urine analysis:    Component Value Date/Time   COLORURINE AMBER (A) 02/14/2020 2248   APPEARANCEUR HAZY (A) 02/14/2020 2248   LABSPEC 1.015 02/14/2020 2248   PHURINE 5.0 02/14/2020 2248   GLUCOSEU NEGATIVE 02/14/2020 2248   HGBUR SMALL (A) 02/14/2020 2248   BILIRUBINUR NEGATIVE 02/14/2020 [place] 02/14/2020 2248  PROTEINUR NEGATIVE 02/14/2020 2248   NITRITE NEGATIVE 02/14/2020 2248   LEUKOCYTESUR NEGATIVE 02/14/2020 2248   Sepsis Labs: @LABRCNTIP (procalcitonin:4,lacticidven:4)  ) Recent Results (from the past 240 hour(s))  SARS Coronavirus 2 by RT PCR (hospital order, performed in [facility] hospital lab) Nasopharyngeal Nasopharyngeal Swab     Status: None   Collection Time: 02/14/20  9:11 PM   Specimen: Nasopharyngeal Swab  Result Value Ref Range Status   SARS Coronavirus 2 NEGATIVE NEGATIVE Final    Comment: (NOTE) SARS-CoV-2 target nucleic acids are NOT DETECTED. The SARS-CoV-2 RNA is generally detectable in upper and lower respiratory specimens during the acute phase of infection. The lowest concentration of SARS-CoV-2 viral copies this assay can detect is 250 copies / mL. A negative result does not preclude SARS-CoV-2 infection and should not be used as the sole basis for treatment or other patient management decisions.  A negative result may occur with improper specimen collection / handling, submission of specimen other than nasopharyngeal swab, presence of viral mutation(s) within the areas targeted by this assay, and inadequate number of viral copies (<250 copies / mL). A negative  result must be combined with clinical observations, patient history, and epidemiological information. Fact Sheet for Patients:   02/16/20 Fact Sheet for Healthcare Providers: BoilerBrush.com.cy This test is not yet approved or cleared  by the https://pope.com/ FDA and has been authorized for detection and/or diagnosis of SARS-CoV-2 by FDA under an Emergency Use Authorization (EUA).  This EUA will remain in effect (meaning this test can be used) for the duration of the COVID-19 declaration under Section 564(b)(1) of the Act, 21 U.S.C. section 360bbb-3(b)(1), unless the authorization is terminated or revoked sooner. Performed at [facility], [address], [place], [place]   Blood culture (routine x 2)     Status: None   Collection Time: 02/14/20  9:59 PM   Specimen: BLOOD  Result Value Ref Range Status   Specimen Description BLOOD LEFT ARM  Final   Special Requests   Final    BOTTLES DRAWN AEROBIC AND ANAEROBIC Blood Culture results may not be optimal due to an inadequate volume of blood received in culture bottles   Culture   Final    NO GROWTH 5 DAYS Performed at [facility], [address], [place], [place]    Report Status 02/19/2020 FINAL  Final  Blood culture (routine x 2)     Status: None   Collection Time: 02/14/20  9:59 PM   Specimen: BLOOD  Result Value Ref Range Status   Specimen Description BLOOD RIGHT HAND  Final   Special Requests   Final    BOTTLES DRAWN AEROBIC AND ANAEROBIC Blood Culture results may not be optimal due to an inadequate volume of blood received in culture bottles   Culture   Final    NO GROWTH 5 DAYS Performed at [facility], [address], [place], [place]    Report Status 02/19/2020 FINAL  Final  MRSA PCR Screening     Status: None   Collection Time: 02/15/20  5:08 AM   Specimen: Nasal Mucosa; Nasopharyngeal  Result Value Ref Range Status    MRSA by PCR NEGATIVE NEGATIVE Final    Comment:        The GeneXpert MRSA Assay (FDA approved for NASAL specimens only), is one component of a comprehensive MRSA colonization surveillance program. It is not intended to diagnose MRSA infection nor to guide or monitor  treatment for MRSA infections. Performed at [facility], [address] [place], [place] [address]   Culture, blood (routine x 2)     Status: None   Collection Time: 02/15/20  6:02 AM   Specimen: BLOOD  Result Value Ref Range Status   Specimen Description BLOOD LEFT ARM  Final   Special Requests   Final    BOTTLES DRAWN AEROBIC AND ANAEROBIC Blood Culture adequate volume   Culture   Final    NO GROWTH 5 DAYS Performed at [facility], [address] [place], [place] [address]    Report Status 02/20/2020 FINAL  Final  Culture, blood (routine x 2)     Status: None   Collection Time: 02/15/20  6:11 AM   Specimen: BLOOD  Result Value Ref Range Status   Specimen Description BLOOD LEFT HAND  Final   Special Requests   Final    BOTTLES DRAWN AEROBIC ONLY Blood Culture results may not be optimal due to an inadequate volume of blood received in culture bottles   Culture   Final    NO GROWTH 5 DAYS Performed at [facility], [address], [place], [place] [address]    Report Status 02/20/2020 FINAL  Final      Radiology Studies: No results found.   Scheduled Meds: . amiodarone  200 mg Oral Daily  . Chlorhexidine Gluconate Cloth  6 each Topical Daily  . feeding supplement (ENSURE ENLIVE)  237 mL Oral BID BM  . senna-docusate  3 tablet Oral QHS  . sodium chloride flush  10-40 mL Intracatheter Q12H  . torsemide  40 mg Oral Daily  . triamcinolone cream  1 application Topical Daily   Continuous Infusions: . DOBUTamine 5 mcg/kg/min (02/22/20 0300)     LOS: 7 days   Time Spent in minutes   30 minutes  Amberlyn Martinezgarcia D.O. on 02/22/2020 at 10:26 AM  Between 7am to 7pm - Please see  pager noted on [email or web address]  After 7pm go to [email or web address]  And look for the night coverage person covering for me after hours  [facility] Office  [phone]

## 2020-02-23 NOTE — Discharge Summary (Signed)
Physician Discharge Summary  Bryan Moran QVZ:[id number] DOB: March 19, 1946 DOA: 02/14/2020  PCP: Patient, No Pcp Per  Admit date: 02/14/2020 Discharge date: 02/25/2020  Time spent: 45 minutes  Recommendations for Outpatient Follow-up:  Patient will be discharged to [facility].   Patient should follow a regular diet.   Discharge Diagnoses:  Acute decompensated heart failure with cardiogenic shock  Acute hypoxic respiratory failure secondary pulmonary edema Acute kidney injury on chronic kidney disease, stage IIIb Persistent atrial fibrillation with RVR Hypokalemia Mild thrombocytopenia Constipation  Discharge Condition: Stable  Diet recommendation: Comfort/regular  Filed Weights   02/16/20 0500 02/17/20 0500 02/18/20 0600  Weight: 112.7 kg 109.4 kg 104 kg    History of present illness:  On 02/15/2020 by Dr. Lasandra Beech (PCCM) 75y M w PMHx of ICM, sCHF NYHAA Class IV, chronic LBBB, pAF on AC, CKD III presenting for shortness of breath and fatigue. Patient evaluated in the ER and noted to be hypotnesive, given 1L bolus. Patient is poor historian, aggravated and yelling "let me sleep I'm fine" then doses back off, history obtained from chart review. Patient adamantly refuses any symptoms, however uncertain of baseline mental status.   Hospital Course:  Acute decompensated heart failure with cardiogenic shock  -Patient was initially admitted to ICU -Patient has end-stage heart failure with biventricular dysfunction -Currently on IV dobutamine -which has been discontinued upon discharge -Continue demadex -Patient is opting for hospice care -Palliative care consulted and appreciated  Acute hypoxic respiratory failure secondary pulmonary edema -Improved  Acute kidney injury on chronic kidney disease, stage IIIb -Secondary to decreased cardiac output and renal perfusion -Creatinine was up to 2.7 on admission -Creatinine improving, now down to 1.6-1.7-suspect this is his  baseline  Persistent atrial fibrillation with RVR -Improved -Continue amiodarone -Coreg and Eliquis discontinued  Hypokalemia -No labs drawn this morning  Mild thrombocytopenia -Platelets have been improving  Constipation -Patient has been able to have a bowel movement -continue Senokot-S  Code status: DNR  Consultants PCCM Palliative care  Procedures  None  Discharge Exam: Vitals:   02/24/20 1500 02/25/20 0721  BP: 112/68 103/69  Pulse: 66 99  Resp: 16 16  Temp: (!) 97.5 F (36.4 C) 97.6 F (36.4 C)  SpO2: 96% 98%     General: Well developed, chronically ill-appearing, NAD  HEENT: NCAT, mucous membranes moist.   Cardiovascular: S1 S2 auscultated, irregular  Respiratory: Diminished breath sounds  Abdomen: Soft, nontender, nondistended, + bowel sounds  Extremities: warm dry without cyanosis clubbing. LE edema  Neuro: AAOx3, nonfocal  Psych: Appropriate mood and affect, pleasant   Discharge Instructions  Allergies as of 02/25/2020   Not on File     Medication List    STOP taking these medications   carvedilol 3.125 MG tablet Commonly known as: COREG   Eliquis 5 MG Tabs tablet Generic drug: apixaban   furosemide 40 MG tablet Commonly known as: LASIX   lisinopril 40 MG tablet Commonly known as: ZESTRIL   pantoprazole 40 MG tablet Commonly known as: PROTONIX   potassium chloride SA 20 MEQ tablet Commonly known as: KLOR-CON     TAKE these medications   amiodarone 200 MG tablet Commonly known as: PACERONE Take 1 tablet by mouth daily.   feeding supplement (ENSURE ENLIVE) Liqd Take 237 mLs by mouth 2 (two) times daily between meals.   torsemide 20 MG tablet Commonly known as: DEMADEX Take 2 tablets (40 mg total) by mouth daily.   triamcinolone cream 0.1 % Commonly known as: KENALOG Apply  1 application topically daily.      Not on File    The results of significant diagnostics from this hospitalization (including  imaging, microbiology, ancillary and laboratory) are listed below for reference.    Significant Diagnostic Studies: CT Head Wo Contrast  Result Date: 02/14/2020 CLINICAL DATA:  Head trauma EXAM: CT HEAD WITHOUT CONTRAST TECHNIQUE: Contiguous axial images were obtained from the base of the skull through the vertex without intravenous contrast. COMPARISON:  None. FINDINGS: Brain: Mild motion degradation. No acute territorial infarction, hemorrhage or intracranial mass. Mild atrophy. Mild chronic small vessel ischemic change of the white matter. 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 Nonenlarged ventricles Vascular: No hyperdense vessels.  Scattered carotid calcification Skull: Normal. Negative for fracture or focal lesion. Sinuses/Orbits: No acute finding. Other: None IMPRESSION: 1. Mild motion degradation. No definite CT evidence for acute intracranial abnormality. 2. Atrophy and mild chronic small vessel ischemic change of the white matter Electronically Signed   By: Jasmine Pang M.D.   On: 02/14/2020 21:48   DG CHEST PORT 1 VIEW  Result Date: 02/16/2020 CLINICAL DATA:  Acute respiratory failure EXAM: PORTABLE CHEST 1 VIEW COMPARISON:  02/15/2020 FINDINGS: Cardiac shadow is enlarged but stable. Right jugular central line is noted at the cavoatrial junction. Patchy bilateral opacities are seen and stable. Small right-sided pleural effusion is seen. No pneumothorax is noted. IMPRESSION: Overall stable appearance of the chest when compared with the previous day. Electronically Signed   By: Alcide Clever M.D.   On: 02/16/2020 08:36   DG CHEST PORT 1 VIEW  Result Date: 02/15/2020 CLINICAL DATA:  Central line placement. EXAM: PORTABLE CHEST 1 VIEW COMPARISON:  Radiographs 02/14/2020 and 02/05/2020. FINDINGS: 1850 hours. Right IJ central venous catheter projects to the level of the superior cavoatrial junction. Stable cardiomegaly and aortic atherosclerosis.  Bilateral upper lobe airspace opacities have mildly progressed. There are stable small bilateral pleural effusions. No pneumothorax or acute osseous findings. IMPRESSION: 1. Right IJ central venous catheter placement without demonstrated complication. 2. Mildly progressive bilateral upper lobe airspace opacities consistent with pneumonia. Electronically Signed   By: Carey Bullocks M.D.   On: 02/15/2020 18:58   DG Chest Portable 1 View  Result Date: 02/14/2020 CLINICAL DATA:  Status post fall with subsequent chest pain and shortness of breath. EXAM: PORTABLE CHEST 1 VIEW COMPARISON:  Feb 05, 2020 FINDINGS: Mild hazy infiltrates are seen within the bilateral upper lobes and right lung base. This is increased in severity when compared to the prior study. There is no evidence of a pleural effusion or pneumothorax. The cardiac silhouette is markedly enlarged and unchanged in size. The visualized skeletal structures are unremarkable. IMPRESSION: 1. Mild hazy bilateral upper lobe and right basilar infiltrates. 2. Stable cardiomegaly. Electronically Signed   By: Aram Candela M.D.   On: 02/14/2020 21:22   [place] EKG SITE RITE  Result Date: 02/15/2020 If Site Rite image not attached, placement could not be confirmed due to current cardiac rhythm.  US Abdomen Limited RUQ  Result Date: 02/15/2020 CLINICAL DATA:  Elevated LFTs. EXAM: ULTRASOUND ABDOMEN LIMITED RIGHT UPPER QUADRANT COMPARISON:  CT of the chest on 02/05/2020 FINDINGS: Gallbladder: Gallbladder is distended. Gallbladder wall is 9.5 millimeters in thickness and mildly heterogeneous. No sonographic Murphy's sign. Sludge is present. A small stone is 8 millimeters. There is pericholecystic fluid. Common bile duct: Diameter: Not well seen distally.  2.4 millimeters Liver: Nodular contour of the liver. Small cyst is identified adjacent to  the gallbladder fossa, measuring 1.5 x 0.9 x 1.0 centimeters. No solid mass identified. Perihepatic fluid noted. Portal  vein is patent on color Doppler imaging with normal direction of blood flow towards the liver. Other: Study quality is degraded by limited patient mobility. IMPRESSION: 1. Cirrhosis and perihepatic fluid. 2. Benign hepatic cyst, 1.5 centimeters. 3. Gallbladder wall thickening, pericholecystic fluid, sludge, and stones. Although these findings can be present in the setting of acute cholecystitis, the lack of sonographic Eulah Pont sign makes the findings less specific. As needed, hepatobiliary scan can be performed to evaluate for acute cholecystitis. Electronically Signed   By: Norva Pavlov M.D.   On: 02/15/2020 10:29    Microbiology: No results found for this or any previous visit (from the past 240 hour(s)).   Labs: Basic Metabolic Panel: Recent Labs  Lab 02/19/20 0348  NA 137  K 3.2*  CL 92*  CO2 34*  GLUCOSE 108*  BUN 38*  CREATININE 1.69*  CALCIUM 7.8*   Liver Function Tests: No results for input(s): AST, ALT, ALKPHOS, BILITOT, PROT, ALBUMIN in the last 168 hours. No results for input(s): LIPASE, AMYLASE in the last 168 hours. No results for input(s): AMMONIA in the last 168 hours. CBC: Recent Labs  Lab 02/19/20 0348 02/20/20 0455  WBC 6.4 7.1  HGB 14.0 13.7  HCT 41.8 41.5  MCV 95.4 96.5  PLT 136* 142*   Cardiac Enzymes: No results for input(s): CKTOTAL, CKMB, CKMBINDEX, TROPONINI in the last 168 hours. BNP: BNP (last 3 results) Recent Labs    02/14/20 0021 02/15/20 0602 02/16/20 0316  BNP 463.6* 1,408.4* 2,798.0*    ProBNP (last 3 results) No results for input(s): PROBNP in the last 8760 hours.  CBG: No results for input(s): GLUCAP in the last 168 hours.     Signed:  Edsel Petrin  [facility] 02/25/2020, 8:48 AM

## 2020-02-23 NOTE — Progress Notes (Signed)
PROGRESS NOTE    Bryan Moran  ZHG:[id number] DOB: 01-11-1946 DOA: 02/14/2020 PCP: Patient, No Pcp Per   Brief Narrative:  HPI On 02/15/2020 by Dr. Rodrigo Ran (PCCM) 73y M w PMHx of ICM, sCHF NYHAA Class IV, chronic LBBB, pAF on AC, CKD III presenting for shortness of breath and fatigue. Patient evaluated in the ER and noted to be hypotnesive, given 1L bolus. Patient is poor historian, aggravated and yelling "let me sleep I'm fine" then doses back off, history obtained from chart review. Patient adamantly refuses any symptoms, however uncertain of baseline mental status.  Assessment & Plan   Acute decompensated heart failure with cardiogenic shock  -Patient was initially admitted to ICU -Patient has end-stage heart failure with biventricular dysfunction -Currently on IV dobutamine -which will be discontinued upon discharge -Continue demadex -Patient is opting for hospice care -Palliative care consulted and appreciated  Acute hypoxic respiratory failure secondary pulmonary edema -Improved, continue supplemental oxygen  Acute kidney injury on chronic kidney disease, stage IIIb -Secondary to decreased cardiac output and renal perfusion -Creatinine was up to 2.7 on admission -Creatinine improving, now down to 1.6-1.7-suspect this is his baseline  Persistent atrial fibrillation with RVR -Improved -Continue amiodarone, Coreg Eliquis  Hypokalemia -No labs drawn this morning  Mild thrombocytopenia -Platelets have been improving  Constipation -Patient has been able to have a bowel movement -continue Senokot-S  DVT Prophylaxis  Eliquis  Code Status: DNR  Family Communication: None at bedside  Disposition Plan:  Status is: Inpatient  Remains inpatient appropriate because:Hemodynamically unstable and pending residential hospice bed.    Dispo: The patient is from: Home              Anticipated d/c is to: [facility]  Or residential hospice              Anticipated d/c date  is: 2 days              Patient currently is not medically stable to d/c.  Consultants PCCM Palliative care  Procedures  None  Antibiotics   Anti-infectives (From admission, onward)   Start     Dose/Rate Route Frequency Ordered Stop   02/16/20 0600  vancomycin (VANCOREADY) IVPB 1250 mg/250 mL  Status:  Discontinued     1,250 mg 166.7 mL/hr over 90 Minutes Intravenous Every 24 hours 02/15/20 0252 02/16/20 0828   02/15/20 1200  ceFEPIme (MAXIPIME) 2 g in sodium chloride 0.9 % 100 mL IVPB  Status:  Discontinued     2 g 200 mL/hr over 30 Minutes Intravenous Every 12 hours 02/15/20 0252 02/17/20 1603   02/15/20 0015  ceFEPIme (MAXIPIME) 2 g in sodium chloride 0.9 % 100 mL IVPB     2 g 200 mL/hr over 30 Minutes Intravenous  Once 02/15/20 0009 02/15/20 0145   02/15/20 0015  metroNIDAZOLE (FLAGYL) IVPB 500 mg     500 mg 100 mL/hr over 60 Minutes Intravenous  Once 02/15/20 0009 02/15/20 0145   02/15/20 0015  vancomycin (VANCOCIN) IVPB 1000 mg/200 mL premix  Status:  Discontinued     1,000 mg 200 mL/hr over 60 Minutes Intravenous  Once 02/15/20 0009 02/15/20 0010   02/15/20 0015  vancomycin (VANCOREADY) IVPB 2000 mg/400 mL     2,000 mg 200 mL/hr over 120 Minutes Intravenous  Once 02/15/20 0010 02/15/20 0355      Subjective:   Bryan Moran seen and examined today.  Continues to feel weak.  Denies current chest pain or shortness of breath, abdominal  pain, nausea or vomiting, diarrhea or constipation. Objective:   Vitals:   02/22/20 0830 02/22/20 1146 02/23/20 0751 02/23/20 1055  BP:  101/66 99/70 94/60   Pulse: 88 97 (!) 108   Resp: 20 20 20 19   Temp:  98.6 F (37 C) (!) 97.3 F (36.3 C) 98.3 F (36.8 C)  TempSrc:  Oral Oral Oral  SpO2:  96% 92% 97%  Weight:      Height:        Intake/Output Summary (Last 24 hours) at 02/23/2020 1213 Last data filed at 02/23/2020 1057 Gross per 24 hour  Intake 257.78 ml  Output 2600 ml  Net -2342.22 ml   Filed Weights   02/16/20 0500  02/17/20 0500 02/18/20 0600  Weight: 112.7 kg 109.4 kg 104 kg   Exam  General: Well developed, chronically ill-appearing, NAD  HEENT: NCAT,  mucous membranes moist.   Extremities: warm dry without cyanosis clubbing or edema  Neuro: AAOx3, nonfocal  Psych: Appropriate mood and affect   Data Reviewed: I have personally reviewed following labs and imaging studies  CBC: Recent Labs  Lab 02/18/20 0400 02/19/20 0348 02/20/20 0455  WBC 7.1 6.4 7.1  HGB 13.9 14.0 13.7  HCT 42.2 41.8 41.5  MCV 95.3 95.4 96.5  PLT 125* 136* 992*   Basic Metabolic Panel: Recent Labs  Lab 02/17/20 0425 02/17/20 1538 02/17/20 2023 02/18/20 0400 02/19/20 0348  NA 136 139 136 138 137  K 3.2* 3.2* 3.0* 3.2* 3.2*  CL 101 99 96* 96* 92*  CO2 27 29 28 31  34*  GLUCOSE 118* 135* 163* 103* 108*  BUN 48* 47* 46* 43* 38*  CREATININE 1.78* 1.67* 1.77* 1.75* 1.69*  CALCIUM 7.4* 7.7* 7.6* 7.8* 7.8*  MG 1.8  --  2.0 2.0  --    GFR: Estimated Creatinine Clearance: 49.3 mL/min (A) (by C-G formula based on SCr of 1.69 mg/dL (H)). Liver Function Tests: No results for input(s): AST, ALT, ALKPHOS, BILITOT, PROT, ALBUMIN in the last 168 hours. No results for input(s): LIPASE, AMYLASE in the last 168 hours. No results for input(s): AMMONIA in the last 168 hours. Coagulation Profile: No results for input(s): INR, PROTIME in the last 168 hours. Cardiac Enzymes: No results for input(s): CKTOTAL, CKMB, CKMBINDEX, TROPONINI in the last 168 hours. BNP (last 3 results) No results for input(s): PROBNP in the last 8760 hours. HbA1C: No results for input(s): HGBA1C in the last 72 hours. CBG: No results for input(s): GLUCAP in the last 168 hours. Lipid Profile: No results for input(s): CHOL, HDL, LDLCALC, TRIG, CHOLHDL, LDLDIRECT in the last 72 hours. Thyroid Function Tests: No results for input(s): TSH, T4TOTAL, FREET4, T3FREE, THYROIDAB in the last 72 hours. Anemia Panel: No results for input(s): VITAMINB12,  FOLATE, FERRITIN, TIBC, IRON, RETICCTPCT in the last 72 hours. Urine analysis:    Component Value Date/Time   COLORURINE AMBER (A) 02/14/2020 2248   APPEARANCEUR HAZY (A) 02/14/2020 2248   LABSPEC 1.015 02/14/2020 2248   PHURINE 5.0 02/14/2020 2248   GLUCOSEU NEGATIVE 02/14/2020 2248   HGBUR SMALL (A) 02/14/2020 2248   BILIRUBINUR NEGATIVE 02/14/2020 2248   KETONESUR NEGATIVE 02/14/2020 2248   PROTEINUR NEGATIVE 02/14/2020 2248   NITRITE NEGATIVE 02/14/2020 2248   LEUKOCYTESUR NEGATIVE 02/14/2020 2248   Sepsis Labs: @LABRCNTIP (procalcitonin:4,lacticidven:4)  ) Recent Results (from the past 240 hour(s))  SARS Coronavirus 2 by RT PCR (hospital order, performed in [place] hospital lab) Nasopharyngeal Nasopharyngeal Swab     Status: None   Collection Time:  02/14/20  9:11 PM   Specimen: Nasopharyngeal Swab  Result Value Ref Range Status   SARS Coronavirus 2 NEGATIVE NEGATIVE Final    Comment: (NOTE) SARS-CoV-2 target nucleic acids are NOT DETECTED. The SARS-CoV-2 RNA is generally detectable in upper and lower respiratory specimens during the acute phase of infection. The lowest concentration of SARS-CoV-2 viral copies this assay can detect is 250 copies / mL. A negative result does not preclude SARS-CoV-2 infection and should not be used as the sole basis for treatment or other patient management decisions.  A negative result may occur with improper specimen collection / handling, submission of specimen other than nasopharyngeal swab, presence of viral mutation(s) within the areas targeted by this assay, and inadequate number of viral copies (<250 copies / mL). A negative result must be combined with clinical observations, patient history, and epidemiological information. Fact Sheet for Patients:   BoilerBrush.com.cy Fact Sheet for Healthcare Providers: https://pope.com/ This test is not yet approved or cleared  by the [place] FDA and has been authorized for detection and/or diagnosis of SARS-CoV-2 by FDA under an Emergency Use Authorization (EUA).  This EUA will remain in effect (meaning this test can be used) for the duration of the COVID-19 declaration under Section 564(b)(1) of the Act, 21 U.S.C. section 360bbb-3(b)(1), unless the authorization is terminated or revoked sooner. Performed at [facility], [address] [place], [place] [address]   Blood culture (routine x 2)     Status: None   Collection Time: 02/14/20  9:59 PM   Specimen: BLOOD  Result Value Ref Range Status   Specimen Description BLOOD LEFT ARM  Final   Special Requests   Final    BOTTLES DRAWN AEROBIC AND ANAEROBIC Blood Culture results may not be optimal due to an inadequate volume of blood received in culture bottles   Culture   Final    NO GROWTH 5 DAYS Performed at [facility], [address], [place], [place] [address]    Report Status 02/19/2020 FINAL  Final  Blood culture (routine x 2)     Status: None   Collection Time: 02/14/20  9:59 PM   Specimen: BLOOD  Result Value Ref Range Status   Specimen Description BLOOD RIGHT HAND  Final   Special Requests   Final    BOTTLES DRAWN AEROBIC AND ANAEROBIC Blood Culture results may not be optimal due to an inadequate volume of blood received in culture bottles   Culture   Final    NO GROWTH 5 DAYS Performed at [facility], [address] [place], [place] [address]    Report Status 02/19/2020 FINAL  Final  MRSA PCR Screening     Status: None   Collection Time: 02/15/20  5:08 AM   Specimen: Nasal Mucosa; Nasopharyngeal  Result Value Ref Range Status   MRSA by PCR NEGATIVE NEGATIVE Final    Comment:        The GeneXpert MRSA Assay (FDA approved for NASAL specimens only), is one component of a comprehensive MRSA colonization surveillance program. It is not intended to diagnose MRSA infection nor to guide or monitor treatment for MRSA  infections. Performed at [facility], [address], [place], [place] [address]   Culture, blood (routine x 2)     Status: None   Collection Time: 02/15/20  6:02 AM   Specimen: BLOOD  Result Value Ref Range Status   Specimen Description BLOOD LEFT ARM  Final  Special Requests   Final    BOTTLES DRAWN AEROBIC AND ANAEROBIC Blood Culture adequate volume   Culture   Final    NO GROWTH 5 DAYS Performed at [facility], [address], [place], [place] [address]    Report Status 02/20/2020 FINAL  Final  Culture, blood (routine x 2)     Status: None   Collection Time: 02/15/20  6:11 AM   Specimen: BLOOD  Result Value Ref Range Status   Specimen Description BLOOD LEFT HAND  Final   Special Requests   Final    BOTTLES DRAWN AEROBIC ONLY Blood Culture results may not be optimal due to an inadequate volume of blood received in culture bottles   Culture   Final    NO GROWTH 5 DAYS Performed at [facility], [address], [place], [place] [address]    Report Status 02/20/2020 FINAL  Final      Radiology Studies: No results found.   Scheduled Meds: . amiodarone  200 mg Oral Daily  . Chlorhexidine Gluconate Cloth  6 each Topical Daily  . feeding supplement (ENSURE ENLIVE)  237 mL Oral BID BM  . senna-docusate  3 tablet Oral QHS  . sodium chloride flush  10-40 mL Intracatheter Q12H  . torsemide  40 mg Oral Daily  . triamcinolone cream  1 application Topical Daily   Continuous Infusions: . DOBUTamine 5 mcg/kg/min (02/22/20 1800)     LOS: 8 days   Time Spent in minutes   30 minutes  Leonid Manus D.O. on 02/23/2020 at 12:13 PM  Between 7am to 7pm - Please see pager noted on [email or web address]  After 7pm go to [email or web address]  And look for the night coverage person covering for me after hours  [facility] Office  [phone]

## 2020-02-23 NOTE — Social Work (Signed)
Per Victorino Dike with [facility], no hospice home bed available at [facility] or [place] campuses. Pt is first on list for [facility] once bed available. Discussed alternative hospice home options [facility] [place], but pt not agreeable and prefers to wait for bed at [facility]. SW will check bed status tomorrow and provide updates.   Dellie Burns, MSW, LCSW [phone] (coverage)

## 2020-02-23 NOTE — Progress Notes (Signed)
[facility])  We do not have any residential hospice beds at either facility today.  TOC manager aware.  Will update family/hospital as soon as bed status changes.  Wallis Bamberg RN, BSN, CCRN [facility] Liaison

## 2020-02-23 NOTE — Progress Notes (Signed)
Pt tolerated sitting in a recliner this afternoon for couple of hours, denies SOB and discomfort, Oxygen contd at 2l via [place], dobutamine contd @612 .2mcg/min. family members were in bed side this afternoon, will continue to monitor  4m, RN

## 2020-02-23 NOTE — Progress Notes (Signed)
   Palliative Medicine Inpatient Follow Up Note   Reason for consult:  Goals of care conversations "Severe combined HFrEF/HFpEF with multiple hospitalizations"  HPI:  Per intake H&P --> Bryan Moran a 74 year old Caucasian male with past medical history remarkable for chronic combined systolic/diastolic congestive heart failure last EF 25-30% with previous refusal of ICD, chronic left bundle branch block, essential hypertension, paroxysmal/persistent atrial fibrillation CKD stage III who presented with progressive shortness of breath, weakness.  Palliative care was asked to get involved in the setting of multiple hospitalizations.   Patient and family opted to transition focus to more of a comfort emphasis last week. Plan to transition to [place] Discussion (02/23/2020): Chart reviewed. I met with Bryan Moran at bedside this morning. He was in good spirits. He asked which medications will and won't continue when he transitions to [facility]. We reviewed his medication list. I shared with him that dobutamine will not continue when he goes to [facility]. He understood this. We reviewed the reasons why certain medications are continued while others are stopped.   Per MAR review there have been no doses of morphine administered.  [name] provided an update via telephone. She states that she will be visiting [name] today.  Still awaiting a [employer] [facility] bed.  Questions and concerns addressed   Vital Signs Vitals:   02/22/20 1146 02/23/20 0751  BP: 101/66 99/70  Pulse: 97 (!) 108  Resp: 20 20  Temp: 98.6 F (37 C) (!) 97.3 F (36.3 C)  SpO2: 96% 92%    Intake/Output Summary (Last 24 hours) at 02/23/2020 0816 Last data filed at 02/22/2020 1900 Gross per 24 hour  Intake 147.78 ml  Output 1800 ml  Net -1652.22 ml   Last Weight  Most recent update: 02/18/2020  6:59 AM   Weight  104 kg (229 lb 4.5 oz)           Gen:  NAD HEENT: moist mucous membranes CV: Regular rate and  rhythm, no murmurs rubs or gallops PULM: On 3LPM [place]. (+) bibasilar crackles.  ABD: soft/nontender/nondistended/normal bowel sounds  EXT: (+) 2 LE edema Neuro: Alert and oriented x4  Recommendations and Plan: - D/C dobutamine prior to transfer to [place] bed - Sister [name] plans to visit this afternoon, she wishes to be notified if he requires morphine for comfort - Minimize medications/pills for comfort per patient request during discussion today  Time Spent: 25 Greater than 50% of the time was spent in counseling and coordination of care ______________________________________________________________________________________ [place] Team Team Cell Phone: [phone] Please utilize secure chat with additional questions, if there is no response within 30 minutes please call the above phone number  Palliative Medicine Team providers are available by phone from 7am to 7pm daily and can be reached through the team cell phone.  Should this patient require assistance outside of these hours, please call the patient's attending physician.

## 2020-02-24 MED ORDER — BIOTENE DRY MOUTH MT LIQD: 15.0000 mL | OROMUCOSAL | Status: DC | PRN

## 2020-02-24 NOTE — Progress Notes (Signed)
Daily Progress Note   Patient Name: Bryan Moran       Date: 02/24/2020 DOB: 1945-11-15  Age: 74 y.o. MRN#: [id number] Attending Physician: Cristal Ford, DO Primary Care Physician: Patient, No Pcp Per Admit Date: 02/14/2020  Reason for Consultation/Follow-up: Severe combined HFrEF/HFpEF with multiple hsopitalizations  Subjective: Denies pain, complains of fatigue and a dry mouth.   Length of Stay: 9  Current Medications: Scheduled Meds:  . amiodarone  200 mg Oral Daily  . Chlorhexidine Gluconate Cloth  6 each Topical Daily  . feeding supplement (ENSURE ENLIVE)  237 mL Oral BID BM  . senna-docusate  3 tablet Oral QHS  . sodium chloride flush  10-40 mL Intracatheter Q12H  . torsemide  40 mg Oral Daily  . triamcinolone cream  1 application Topical Daily    Continuous Infusions: . DOBUTamine 5 mcg/kg/min (02/23/20 1518)    PRN Meds: bisacodyl, docusate sodium, magnesium hydroxide, morphine injection, ondansetron (ZOFRAN) IV, oxyCODONE, sodium chloride flush, sodium phosphate   Vital Signs: BP 100/68 (BP Location: Left Arm)   Pulse 69   Temp 97.6 F (36.4 C) (Oral)   Resp 16   Ht 6' (1.829 m)   Wt 104 kg   SpO2 98%   BMI 31.10 kg/m  SpO2: SpO2: 98 % O2 Device: O2 Device: Nasal Cannula O2 Flow Rate: O2 Flow Rate (L/min): 2.5 L/min  Intake/output summary:   Intake/Output Summary (Last 24 hours) at 02/24/2020 1423 Last data filed at 02/24/2020 0730 Gross per 24 hour  Intake 240 ml  Output 725 ml  Net -485 ml   LBM: Last BM Date: 02/24/20 Baseline Weight: Weight: 122.5 kg Most recent weight: Weight: 104 kg       Palliative Assessment/Data:  Physical exam:  General: In NAD Neuro: Alert, orient x 4 ENT: mucous membranes dry Pulmonary: Respirations nonlabored,  BLBS=mild rales lower lobes, Oxygen at 3 lpm [place] Abdomen: Round, soft, nontender, not distended, BS normoactive Extremities: 3+ edema lower legs Skin: scattered bruises in various colors over forearms.   Patient Active Problem List   Diagnosis Date Noted  . Acute respiratory failure ([place])   . Decompensated heart failure ([place]) 02/15/2020  . Acute on chronic systolic congestive heart failure ([place])   . Elevated LFTs   . Palliative care by specialist   . Goals of care,  counseling/discussion   . DNR (do not resuscitate) discussion     Palliative Care Assessment & Plan   Patient Profile: Per intake H&P -->Nyzir [name] a 74 year old Caucasian male with past medical history remarkable for chronic combined systolic/diastolic congestive heart failure last EF 25-30% with previous refusal of ICD, chronic left bundle branch block, essential hypertension, paroxysmal/persistent atrial fibrillation CKD stage III who presented with progressive shortness of breath, weakness.  Palliative Medicine following for comfort measure while awaiting placement at [facility] for hospice.   Today's Discussion (02/24/20): Chart reviewed. He has received no doses of morphine. I met with nurse Palma Holter. I met with Mr. Chachere at bedside. He has no complaints of pain. Dobutamine is running at 1mg/kg/min or 612 mcg/min. Mr. RHyamsasked lots of questions about process for transfer and dobutamine drip. I attempted to provide answers.   [facility]    Recommendations/Plan: [facility] Add biotene mouthwash for dry mouth   Code Status: DNR  Prognosis:  < 2 weeks  Discharge Planning: Hospice facility  Care plan was discussed with patient and nurse  Thank you for allowing the Palliative Medicine Team to assist in the care of this patient.  Time spent 25 minutes    Greater than 50%  of this time was spent counseling and  coordinating care related to the above assessment and plan.  ADonalynn Furlong NP  Please contact Palliative Medicine Team phone at [phone] questions and concerns.   Please utilize secure chat with additional questions, if there is no response within 30 minutes please call the above phone number  Palliative Medicine Team providers are available by phone from 7am to 7pm daily and can be reached through the team cell phone.  Should this patient require assistance outside of these hours, please call the patient's attending physician.

## 2020-02-24 NOTE — Progress Notes (Signed)
Pt pretty much comfortable whole day, vitals been stable, pt denies any pain and discomfort, dobutamine contd. @5mcg /min, visitor updated regarding pending bed at hospice, will continue to monitor the patient  , RN

## 2020-02-24 NOTE — Progress Notes (Addendum)
[facility] needs teacher does not have a bed today.  TOC team updated.  Will update pt and family.  Wallis Bamberg RN, BSN, CCRN [facility] Liaison   **was to visit pt to update, but he was resting, no family present.

## 2020-02-24 NOTE — Progress Notes (Signed)
PROGRESS NOTE    Bryan Moran  BTD:[id number] DOB: Oct 02, 1945 DOA: 02/14/2020 PCP: Patient, No Pcp Per   Brief Narrative:  HPI On 02/15/2020 by Dr. Rodrigo Ran (PCCM) 73y M w PMHx of ICM, sCHF NYHAA Class IV, chronic LBBB, pAF on AC, CKD III presenting for shortness of breath and fatigue. Patient evaluated in the ER and noted to be hypotnesive, given 1L bolus. Patient is poor historian, aggravated and yelling "let me sleep I'm fine" then doses back off, history obtained from chart review. Patient adamantly refuses any symptoms, however uncertain of baseline mental status.  Assessment & Plan   Acute decompensated heart failure with cardiogenic shock  -Patient was initially admitted to ICU -Patient has end-stage heart failure with biventricular dysfunction -Currently on IV dobutamine -which will be discontinued upon discharge -Continue demadex -Patient is opting for hospice care -Palliative care consulted and appreciated  Acute hypoxic respiratory failure secondary pulmonary edema -Improved, continue supplemental oxygen  Acute kidney injury on chronic kidney disease, stage IIIb -Secondary to decreased cardiac output and renal perfusion -Creatinine was up to 2.7 on admission -Creatinine improving, now down to 1.6-1.7-suspect this is his baseline  Persistent atrial fibrillation with RVR -Improved -Continue amiodarone, Coreg Eliquis  Hypokalemia -No labs drawn this morning  Mild thrombocytopenia -Platelets have been improving  Constipation -Patient has been able to have a bowel movement -continue Senokot-S  DVT Prophylaxis  Eliquis  Code Status: DNR  Family Communication: None at bedside  Disposition Plan:  Status is: Inpatient  Remains inpatient appropriate because:Hemodynamically unstable and pending residential hospice bed.    Dispo: The patient is from: Home              Anticipated d/c is to: [facility]  Or residential hospice              Anticipated d/c date  is: 2 days              Patient currently is not medically stable to d/c.  Consultants PCCM Palliative care  Procedures  None  Antibiotics   Anti-infectives (From admission, onward)   Start     Dose/Rate Route Frequency Ordered Stop   02/16/20 0600  vancomycin (VANCOREADY) IVPB 1250 mg/250 mL  Status:  Discontinued     1,250 mg 166.7 mL/hr over 90 Minutes Intravenous Every 24 hours 02/15/20 0252 02/16/20 0828   02/15/20 1200  ceFEPIme (MAXIPIME) 2 g in sodium chloride 0.9 % 100 mL IVPB  Status:  Discontinued     2 g 200 mL/hr over 30 Minutes Intravenous Every 12 hours 02/15/20 0252 02/17/20 1603   02/15/20 0015  ceFEPIme (MAXIPIME) 2 g in sodium chloride 0.9 % 100 mL IVPB     2 g 200 mL/hr over 30 Minutes Intravenous  Once 02/15/20 0009 02/15/20 0145   02/15/20 0015  metroNIDAZOLE (FLAGYL) IVPB 500 mg     500 mg 100 mL/hr over 60 Minutes Intravenous  Once 02/15/20 0009 02/15/20 0145   02/15/20 0015  vancomycin (VANCOCIN) IVPB 1000 mg/200 mL premix  Status:  Discontinued     1,000 mg 200 mL/hr over 60 Minutes Intravenous  Once 02/15/20 0009 02/15/20 0010   02/15/20 0015  vancomycin (VANCOREADY) IVPB 2000 mg/400 mL     2,000 mg 200 mL/hr over 120 Minutes Intravenous  Once 02/15/20 0010 02/15/20 0355      Subjective:   Bryan Moran seen and examined today.  Patient has no complaints today.  Denies chest pain or shortness of breath, dizziness  or headache. Objective:   Vitals:   02/23/20 1055 02/23/20 1523 02/23/20 1915 02/24/20 0730  BP: 94/60 105/70 96/68 136/78  Pulse:  (!) 103 92 (!) 106  Resp: 19 16 (!) 21 (!) 23  Temp: 98.3 F (36.8 C) 97.6 F (36.4 C) 98.1 F (36.7 C) (!) 97.5 F (36.4 C)  TempSrc: Oral Oral Oral Oral  SpO2: 97% 96% 96% 97%  Weight:      Height:        Intake/Output Summary (Last 24 hours) at 02/24/2020 1031 Last data filed at 02/24/2020 0730 Gross per 24 hour  Intake 480 ml  Output 1975 ml  Net -1495 ml   Filed Weights   02/16/20 0500  02/17/20 0500 02/18/20 0600  Weight: 112.7 kg 109.4 kg 104 kg   Exam  General: Well developed, chronically ill appearing, NAD  HEENT: NCAT, mucous membranes moist.   Neuro: AAOx3, nonfocal  Psych: Normal affect and demeanor with intact judgement and insight   Data Reviewed: I have personally reviewed following labs and imaging studies  CBC: Recent Labs  Lab 02/18/20 0400 02/19/20 0348 02/20/20 0455  WBC 7.1 6.4 7.1  HGB 13.9 14.0 13.7  HCT 42.2 41.8 41.5  MCV 95.3 95.4 96.5  PLT 125* 136* 350*   Basic Metabolic Panel: Recent Labs  Lab 02/17/20 1538 02/17/20 2023 02/18/20 0400 02/19/20 0348  NA 139 136 138 137  K 3.2* 3.0* 3.2* 3.2*  CL 99 96* 96* 92*  CO2 29 28 31  34*  GLUCOSE 135* 163* 103* 108*  BUN 47* 46* 43* 38*  CREATININE 1.67* 1.77* 1.75* 1.69*  CALCIUM 7.7* 7.6* 7.8* 7.8*  MG  --  2.0 2.0  --    GFR: Estimated Creatinine Clearance: 49.3 mL/min (A) (by C-G formula based on SCr of 1.69 mg/dL (H)). Liver Function Tests: No results for input(s): AST, ALT, ALKPHOS, BILITOT, PROT, ALBUMIN in the last 168 hours. No results for input(s): LIPASE, AMYLASE in the last 168 hours. No results for input(s): AMMONIA in the last 168 hours. Coagulation Profile: No results for input(s): INR, PROTIME in the last 168 hours. Cardiac Enzymes: No results for input(s): CKTOTAL, CKMB, CKMBINDEX, TROPONINI in the last 168 hours. BNP (last 3 results) No results for input(s): PROBNP in the last 8760 hours. HbA1C: No results for input(s): HGBA1C in the last 72 hours. CBG: No results for input(s): GLUCAP in the last 168 hours. Lipid Profile: No results for input(s): CHOL, HDL, LDLCALC, TRIG, CHOLHDL, LDLDIRECT in the last 72 hours. Thyroid Function Tests: No results for input(s): TSH, T4TOTAL, FREET4, T3FREE, THYROIDAB in the last 72 hours. Anemia Panel: No results for input(s): VITAMINB12, FOLATE, FERRITIN, TIBC, IRON, RETICCTPCT in the last 72 hours. Urine analysis:     Component Value Date/Time   COLORURINE AMBER (A) 02/14/2020 2248   APPEARANCEUR HAZY (A) 02/14/2020 2248   LABSPEC 1.015 02/14/2020 2248   PHURINE 5.0 02/14/2020 2248   GLUCOSEU NEGATIVE 02/14/2020 2248   HGBUR SMALL (A) 02/14/2020 2248   BILIRUBINUR NEGATIVE 02/14/2020 2248   KETONESUR NEGATIVE 02/14/2020 2248   PROTEINUR NEGATIVE 02/14/2020 2248   NITRITE NEGATIVE 02/14/2020 2248   LEUKOCYTESUR NEGATIVE 02/14/2020 2248   Sepsis Labs: @LABRCNTIP (procalcitonin:4,lacticidven:4)  ) Recent Results (from the past 240 hour(s))  SARS Coronavirus 2 by RT PCR (hospital order, performed in [place] hospital lab) Nasopharyngeal Nasopharyngeal Swab     Status: None   Collection Time: 02/14/20  9:11 PM   Specimen: Nasopharyngeal Swab  Result Value Ref Range  Status   SARS Coronavirus 2 NEGATIVE NEGATIVE Final    Comment: (NOTE) SARS-CoV-2 target nucleic acids are NOT DETECTED. The SARS-CoV-2 RNA is generally detectable in upper and lower respiratory specimens during the acute phase of infection. The lowest concentration of SARS-CoV-2 viral copies this assay can detect is 250 copies / mL. A negative result does not preclude SARS-CoV-2 infection and should not be used as the sole basis for treatment or other patient management decisions.  A negative result may occur with improper specimen collection / handling, submission of specimen other than nasopharyngeal swab, presence of viral mutation(s) within the areas targeted by this assay, and inadequate number of viral copies (<250 copies / mL). A negative result must be combined with clinical observations, patient history, and epidemiological information. Fact Sheet for Patients:   BoilerBrush.com.cy Fact Sheet for Healthcare Providers: https://pope.com/ This test is not yet approved or cleared  by the [place] FDA and has been authorized for detection and/or diagnosis of SARS-CoV-2  by FDA under an Emergency Use Authorization (EUA).  This EUA will remain in effect (meaning this test can be used) for the duration of the COVID-19 declaration under Section 564(b)(1) of the Act, 21 U.S.C. section 360bbb-3(b)(1), unless the authorization is terminated or revoked sooner. Performed at [facility], [address], [place], [place] [address]   Blood culture (routine x 2)     Status: None   Collection Time: 02/14/20  9:59 PM   Specimen: BLOOD  Result Value Ref Range Status   Specimen Description BLOOD LEFT ARM  Final   Special Requests   Final    BOTTLES DRAWN AEROBIC AND ANAEROBIC Blood Culture results may not be optimal due to an inadequate volume of blood received in culture bottles   Culture   Final    NO GROWTH 5 DAYS Performed at [facility], [address], [place], [place] [address]    Report Status 02/19/2020 FINAL  Final  Blood culture (routine x 2)     Status: None   Collection Time: 02/14/20  9:59 PM   Specimen: BLOOD  Result Value Ref Range Status   Specimen Description BLOOD RIGHT HAND  Final   Special Requests   Final    BOTTLES DRAWN AEROBIC AND ANAEROBIC Blood Culture results may not be optimal due to an inadequate volume of blood received in culture bottles   Culture   Final    NO GROWTH 5 DAYS Performed at [facility], [address] [place], [place] [address]    Report Status 02/19/2020 FINAL  Final  MRSA PCR Screening     Status: None   Collection Time: 02/15/20  5:08 AM   Specimen: Nasal Mucosa; Nasopharyngeal  Result Value Ref Range Status   MRSA by PCR NEGATIVE NEGATIVE Final    Comment:        The GeneXpert MRSA Assay (FDA approved for NASAL specimens only), is one component of a comprehensive MRSA colonization surveillance program. It is not intended to diagnose MRSA infection nor to guide or monitor treatment for MRSA infections. Performed at [facility], [address] [place], [place] [address]    Culture, blood (routine x 2)     Status: None   Collection Time: 02/15/20  6:02 AM   Specimen: BLOOD  Result Value Ref Range Status   Specimen Description BLOOD LEFT ARM  Final   Special Requests   Final    BOTTLES DRAWN AEROBIC AND ANAEROBIC  Blood Culture adequate volume   Culture   Final    NO GROWTH 5 DAYS Performed at [facility], [address], [place], [place] [address]    Report Status 02/20/2020 FINAL  Final  Culture, blood (routine x 2)     Status: None   Collection Time: 02/15/20  6:11 AM   Specimen: BLOOD  Result Value Ref Range Status   Specimen Description BLOOD LEFT HAND  Final   Special Requests   Final    BOTTLES DRAWN AEROBIC ONLY Blood Culture results may not be optimal due to an inadequate volume of blood received in culture bottles   Culture   Final    NO GROWTH 5 DAYS Performed at [facility], [address] [place], [place] [address]    Report Status 02/20/2020 FINAL  Final      Radiology Studies: No results found.   Scheduled Meds: . amiodarone  200 mg Oral Daily  . Chlorhexidine Gluconate Cloth  6 each Topical Daily  . feeding supplement (ENSURE ENLIVE)  237 mL Oral BID BM  . senna-docusate  3 tablet Oral QHS  . sodium chloride flush  10-40 mL Intracatheter Q12H  . torsemide  40 mg Oral Daily  . triamcinolone cream  1 application Topical Daily   Continuous Infusions: . DOBUTamine 5 mcg/kg/min (02/23/20 1518)     LOS: 9 days   Time Spent in minutes   15 minutes  Nadim Malia D.O. on 02/24/2020 at 10:31 AM  Between 7am to 7pm - Please see pager noted on [email or web address]  After 7pm go to [email or web address]  And look for the night coverage person covering for me after hours  [facility] Office  [phone]

## 2020-02-25 ENCOUNTER — Encounter: Payer: Self-pay | Admitting: Physician Assistant

## 2020-02-25 MED ORDER — ENSURE ENLIVE PO LIQD: 237.0000 mL | Freq: Two times a day (BID) | ORAL | Status: AC

## 2020-02-25 MED ORDER — TORSEMIDE 20 MG PO TABS: 40.0000 mg | ORAL_TABLET | Freq: Every day | ORAL | Status: AC

## 2020-02-25 MED ORDER — HEPARIN SOD (PORK) LOCK FLUSH 100 UNIT/ML IV SOLN
500.0000 [IU] | INTRAVENOUS | Status: DC | PRN
  Filled 2020-02-25: qty 5

## 2020-02-25 NOTE — Progress Notes (Deleted)
Cardiology Office Note    Date:  02/25/2020   ID:  Bryan Moran, DOB April 16, 1946, MRN [id number]  PCP:  Bryan Apley, MD  Cardiologist:  Bryan Alexander, MD  Electrophysiologist:  None   Chief Complaint: f/u afib, CHF  History of Present Illness:   Bryan Moran is a 74 y.o. male with history of chronic systolic CHF, chronic LBBB, hypertensive heart disease with CHF, paroxysmal/persistent atrial fibrillation, remote GI bleeding, recent progressive renal insufficiency and hypoalbuminemia who presents for post-hospital follow-up.  He was diagnosed with atrial fib in 2015 and found to have EF 15-20%. He was very physically active at the time. He was started on Eliquis and developed upper GI bleed. He underwent embolization procedure. He was started on amiodarone and guideline directed therapy. He had been cleared to resume anticoagulation but the patient declined to reconsider. EF remained persistently low. He was referred back to Dr. Johney Moran and the patient declined BiV-ICD as well as left atrial appendage occlusion. Do not Moran prior ischemic evaluation has been pursued. He has been maintained on longstanding amiodarone by Dr. Delton Moran although he self-discontinued this around 3/20219. He initially was on Entresto however he had issues with hypotension therefore this was discontinued, also notation of financial concerns as well. He also stopped taking his carvedilol and spironolactone some time before recent admission.  He was recently admitted 02/06/20 with a months' history of SOB and diarrhea. He received his Covid vaccine and had felt symptomatic since that time. He was found to be back in atrial fib with RVR with mild hypotension and elevated lactic acid. CXR showed left hilar density, differential includes left upper lobe consolidation versus left hilar mass. Chest CT was obtained which showed patchy bilateral areas of airspace disease, also small bilateral pleural effusions (follow-up to resolution is  recommended, as the presence of an underlying neoplastic process cannot be excluded). HsTroponins were low/flat, not felt c/w ACS. Repeat echo 02/06/20 showed EF 20-25%, global HK, severe LV dilatation, mild LVH, moderately reduced RV function, severe LAE, mild MR/AI/RAE. He was placed back on anticoagulation. He underwent TEE/DCCV 02/08/20 with restoration of NSR but reverted back to Afib. He was placed back on amiodarone with recommendation to consider repeat cardioversion after 4 weeks of therapy. During admission he had issues with hypotension, unable to support additional GDMT beyond Lasix and low dose carvedilol. Last labs personally reviewed 01/2020 with K 3.9, Cr 1.48 (previously 1.11 in 2018), Mg 1.9, Hgb 15.1, Plt 131, albumin 2.5, d-dimer 1.92, TSH wnl,   CMET CBC Ct chest timing? Ref neph PYP  Persistent atrial fibrillation Chronic systolic CHF/cardiomyopathy and underlying LBBB Renal insufficiency with hypoalbuminemia Thrombocytopenia  Abnormal chest CT   Past Medical History:  Diagnosis Date  . Acute blood loss anemia   . Acute duodenal ulcer with hemorrhage 06/10/2014  . Cardiomyopathy (HCC)   . Chronic systolic CHF (congestive heart failure) (HCC)   . Coagulopathy (HCC)   . Dysrhythmia   . Eczema   . Hypertension   . Hypoalbuminemia    noted on labs 01/2020  . Persistent atrial fibrillation (HCC)    a. Officially dx 05/27/14, sx may have been going on longer.  . Renal insufficiency   . Thrombocytopenia (HCC)    noted on labs 01/2020  . Venous insufficiency     Past Surgical History:  Procedure Laterality Date  . CARDIOVERSION N/A 06/05/2014   Procedure: CARDIOVERSION;  Surgeon: Bryan Reichert, MD;  Location: [facility] ENDOSCOPY;  Service: Cardiovascular;  Laterality: N/A;  .  CARDIOVERSION N/A 02/08/2020   Procedure: CARDIOVERSION;  Surgeon: Bryan Latch, MD;  Location: [place];  Service: Cardiovascular;  Laterality: N/A;  . ESOPHAGOGASTRODUODENOSCOPY N/A 06/07/2014    Procedure: ESOPHAGOGASTRODUODENOSCOPY (EGD);  Surgeon: Bryan Beams, MD;  [facility] ENDOSCOPY;  Service: Endoscopy;  Laterality: N/A;  . ESOPHAGOGASTRODUODENOSCOPY N/A 06/12/2014   Procedure: ESOPHAGOGASTRODUODENOSCOPY (EGD);  Surgeon: Bryan Mayer, MD;  Location: [facility] ENDOSCOPY;  Service: Endoscopy;  Laterality: N/A;  . [place]   [facility]  . TEE WITHOUT CARDIOVERSION N/A 06/05/2014   Procedure: TRANSESOPHAGEAL ECHOCARDIOGRAM (TEE);  Surgeon: Bryan Margarita, MD;  Location: [place];  Service: Cardiovascular;  Laterality: N/A;  . TEE WITHOUT CARDIOVERSION N/A 02/08/2020   Procedure: TRANSESOPHAGEAL ECHOCARDIOGRAM (TEE);  Surgeon: Bryan Latch, MD;  Location: [facility] ENDOSCOPY;  Service: Cardiovascular;  Laterality: N/A;    Current Medications: No outpatient medications have been marked as taking for the 02/27/20 encounter (Appointment) with Bryan Pitter, PA-C.   ***   Allergies:   Patient has no known allergies.   Social History   Socioeconomic History  . Marital status: Single    Spouse name: Not on file  . Number of children: Not on file  . Years of education: Not on file  . Highest education level: Not on file  Occupational History  . Not on file  Tobacco Use  . Smoking status: Never Smoker  . Smokeless tobacco: Never Used  Substance and Sexual Activity  . Alcohol use: No  . Drug use: No  . Sexual activity: Not Currently  Other Topics Concern  . Not on file  Social History Narrative  . Not on file   Social Determinants of Health   Financial Resource Strain:   . Difficulty of Paying Living Expenses:   Food Insecurity:   . Worried About Charity fundraiser in the Last Year:   . Arboriculturist in the Last Year:   Transportation Needs:   . Film/video editor (Medical):   Marland Kitchen Lack of Transportation (Non-Medical):   Physical Activity:   . Days of Exercise per Week:   . Minutes of Exercise per Session:   Stress:   . Feeling of Stress :     Social Connections:   . Frequency of Communication with Friends and Family:   . Frequency of Social Gatherings with Friends and Family:   . Attends Religious Services:   . Active Member of Clubs or Organizations:   . Attends Archivist Meetings:   Marland Kitchen Marital Status:      Family History:  The patient's ***family history includes Cancer in his mother; Depression in his father; Heart disease in his father; Multiple sclerosis in his mother.  ROS:   Please Moran the history of present illness. Otherwise, review of systems is positive for ***.  All other systems are reviewed and otherwise negative.    EKGs/Labs/Other Studies Reviewed:    Studies reviewed are outlined and summarized above. Reports included below if pertinent.  2D echo 02/06/20 1. Left ventricular ejection fraction, by estimation, is 20 to 25%. The  left ventricle has severely decreased function. The left ventricle  demonstrates global hypokinesis with significant septal dyssynchrony. The  left ventricular internal cavity size  was severely dilated. There is mild concentric left ventricular  hypertrophy.  2. Right ventricular systolic function is moderately reduced. The right  ventricular size is moderately enlarged. There is normal pulmonary artery  systolic pressure. The estimated right ventricular systolic pressure  is  26.2 mmHg.  3. Left atrial size was severely dilated.  4. The mitral valve is normal in structure. Mild mitral valve  regurgitation. No evidence of mitral stenosis.  5. The aortic valve is normal in structure. Aortic valve regurgitation is  mild. No aortic stenosis is present.  6. The inferior vena cava is normal in size with greater than 50%  respiratory variability, suggesting right atrial pressure of 3 mmHg.  7. Right atrial size was mildly dilated.     EKG:  EKG is ordered today, personally reviewed, demonstrating ***  Recent Labs: 02/05/2020: B Natriuretic Peptide 789.9; TSH  3.183 02/09/2020: ALT 27 02/11/2020: Hemoglobin 15.1; Magnesium 1.9; Platelets 131 02/12/2020: BUN 34; Creatinine, Ser 1.48; Potassium 3.9; Sodium 140  Recent Lipid Panel    Component Value Date/Time   CHOL 182 06/02/2016 1205   TRIG 115 06/02/2016 1205   HDL 44 06/02/2016 1205   CHOLHDL 4.1 06/02/2016 1205   VLDL 23 06/02/2016 1205   LDLCALC 115 06/02/2016 1205    PHYSICAL EXAM:    VS:  There were no vitals taken for this visit.  BMI: There is no height or weight on file to calculate BMI.  GEN: Well nourished, well developed, in no acute distress HEENT: normocephalic, atraumatic Neck: no JVD, carotid bruits, or masses Cardiac: ***RRR; no murmurs, rubs, or gallops, no edema  Respiratory:  clear to auscultation bilaterally, normal work of breathing GI: soft, nontender, nondistended, + BS MS: no deformity or atrophy Skin: warm and dry, no rash Neuro:  Alert and Oriented x 3, Strength and sensation are intact, follows commands Psych: euthymic mood, full affect  Wt Readings from Last 3 Encounters:  02/13/20 250 lb (113.4 kg)  07/27/18 247 lb (112 kg)  12/15/17 250 lb 12.8 oz (113.8 kg)     ASSESSMENT & PLAN:   1. ***  Disposition: F/u with ***   Medication Adjustments/Labs and Tests Ordered: Current medicines are reviewed at length with the patient today.  Concerns regarding medicines are outlined above. Medication changes, Labs and Tests ordered today are summarized above and listed in the Patient Instructions accessible in Encounters.   Signed, Laurann Montana, PA-C  02/25/2020 3:23 PM    [facility] [address] [place], [place], [place]  [address] Phone: [phone]; Fax: [phone]

## 2020-02-25 NOTE — Progress Notes (Addendum)
Civil engineer, contracting [facility])  [facility] has a bed for [name] today.  Once necessary consents are obtained from Mr. Puryear, transportation can be arranged.  RN staff, please call report at any time to [phone].  Bed is assigned at that time. Dobutamine can be weaned down and will need to be stopped prior to transferring.  You may leave the CVC in place and d/c PIV.  Once completed, please fax the dc summary to [phone].  [facility] will update TOC manager to arrange transportation once paperwork is completed.  Wallis Bamberg RN, BSN, CCRN [facility] Liaison  [phone]  **necessary consents are completed, transportation can be arranged, [facility] managers updated.

## 2020-02-25 NOTE — Progress Notes (Signed)
Discharged to [facility] by [facility] ambulance. Report given to staff. Belongings taken home.

## 2020-02-25 NOTE — TOC Transition Note (Signed)
Transition of Care [facility]) - CM/SW Discharge Note   Patient Details  Name: Bryan Moran MRN: [id number] Date of Birth: Apr 22, 1946  Transition of Care [facility]) CM/SW Contact:  Eduard Roux, LCSWA Phone Number: 02/25/2020, 11:47 AM   Clinical Narrative:     Patient will DC to: [facility]  DC Date: 02/25/2020 Family Notified: Josephine,sister Transport By: Sharin Mons   Per MD patient is ready for discharge. RN, patient, and facility notified of DC. Discharge Summary faxed to [phone]. RN given number for [phone]. Ambulance transport requested for patient.   See note from Hospice: RN staff, please call report at any time to [phone].  Bed is assigned at that time. Dobutamine can be weaned down and will need to be stopped prior to transferring.  You may leave the CVC in place and d/c PIV.  Clinical Social Worker signing off.  Antony Blackbird, MSW, LCSWA Clinical Social Worker    Final next level of care: Hospice Medical Facility Barriers to Discharge: Barriers Resolved   Patient Goals and CMS Choice        Discharge Placement              Patient chooses bed at: [facility]) Patient to be transferred to facility by: [facility] Name of family member notified: Julieanne Cotton, sister Patient and family notified of of transfer: 02/25/20  Discharge Plan and Services                                     Social Determinants of Health (SDOH) Interventions     Readmission Risk Interventions No flowsheet data found.

## 2020-02-26 ENCOUNTER — Encounter: Payer: Self-pay | Admitting: Physician Assistant

## 2020-02-27 ENCOUNTER — Ambulatory Visit: Payer: BC Managed Care – PPO | Admitting: Physician Assistant

## 2020-03-27 DEATH — deceased

## 2021-02-16 IMAGING — CT CT HEAD W/O CM
3 series · 15 of 47 positions shown, 18 images · non-contrast
Comparison: None.

CLINICAL DATA: Head trauma

EXAM:
CT HEAD WITHOUT CONTRAST
TECHNIQUE: Contiguous axial images were obtained from the base of the skull
through the vertex without intravenous contrast.

[Series 2: head 5.0 h30s · axial · 0.45mm/px · z∈[-117,+38]mm · 9 of 37 slices shown, 12 images]
[im 3/37  brain]
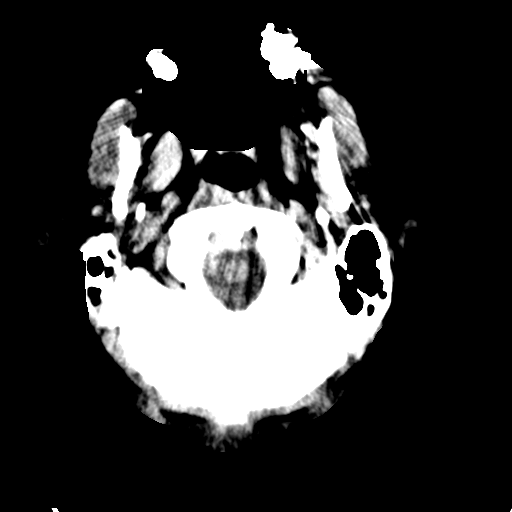
[im 3/37  bone]
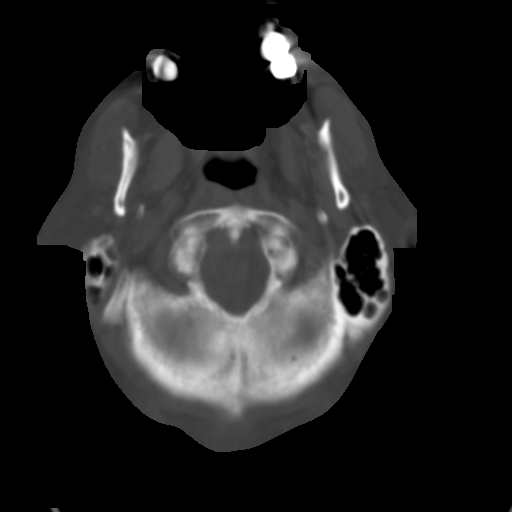
[im 7/37  brain]
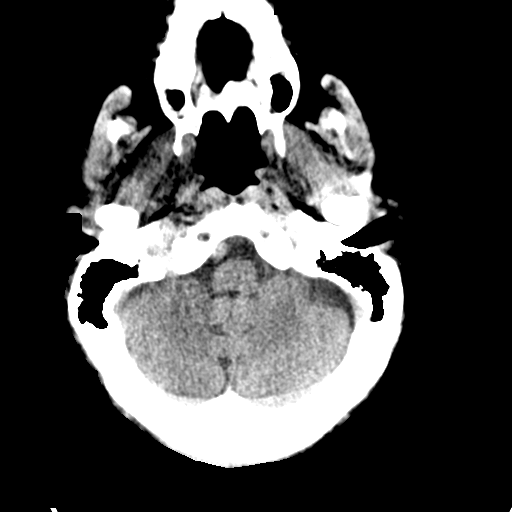
[im 10/37  brain]
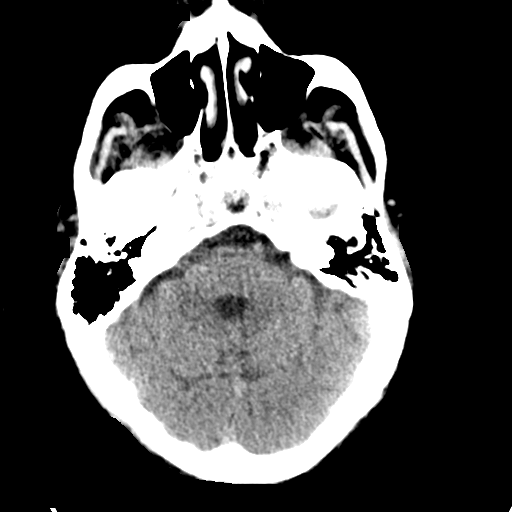
[im 14/37  brain]
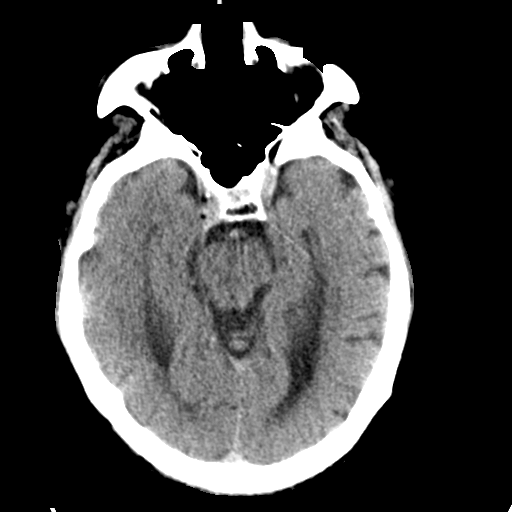
[im 19/37  brain]
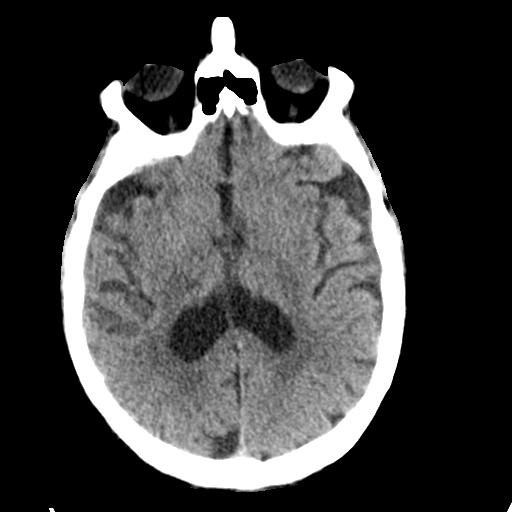
[im 19/37  bone]
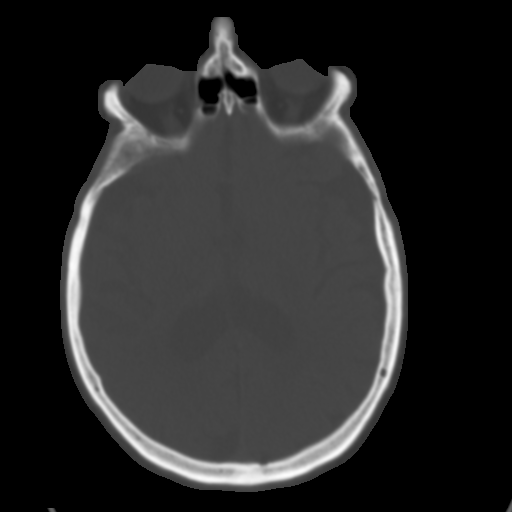
[im 23/37  brain]
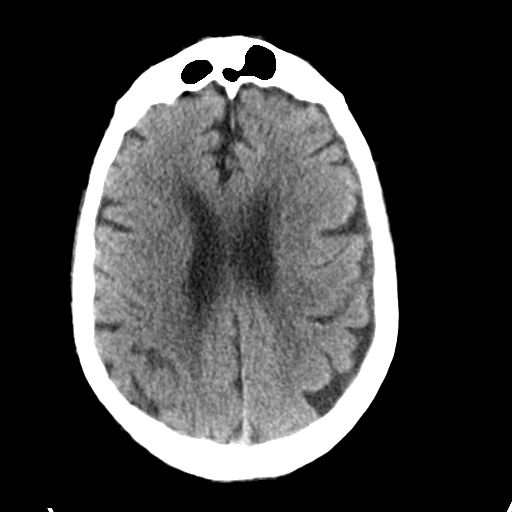
[im 27/37  brain]
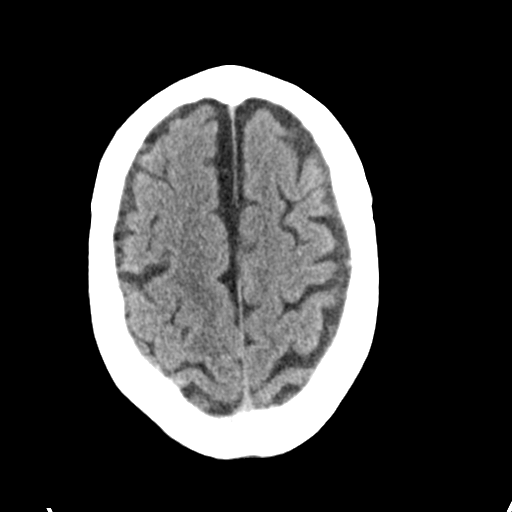
[im 30/37  brain]
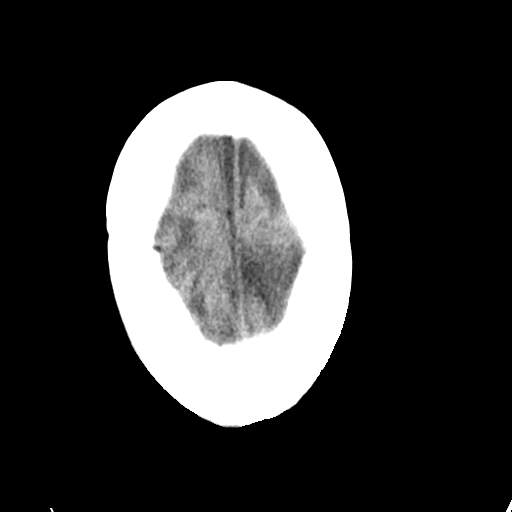
[im 34/37  brain]
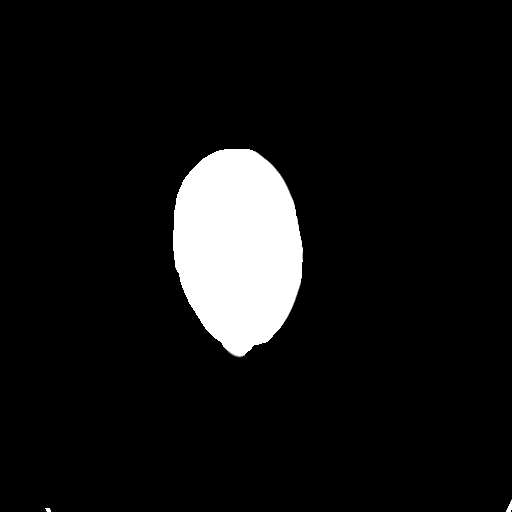
[im 34/37  bone]
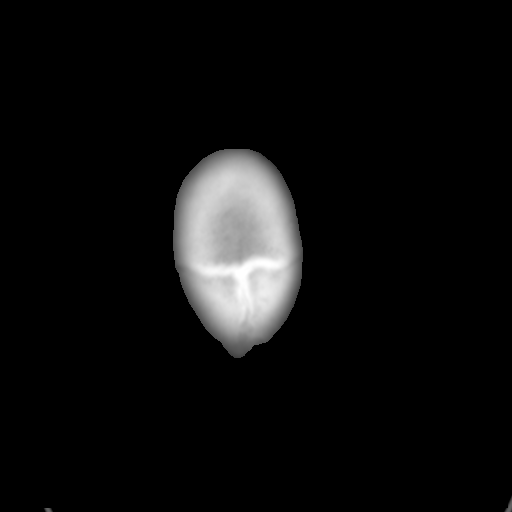

[Series 4: head 3.0 mpr cor · coronal · 0.37mm/px · 3 of 74 slices shown]
[im 25/74  brain]
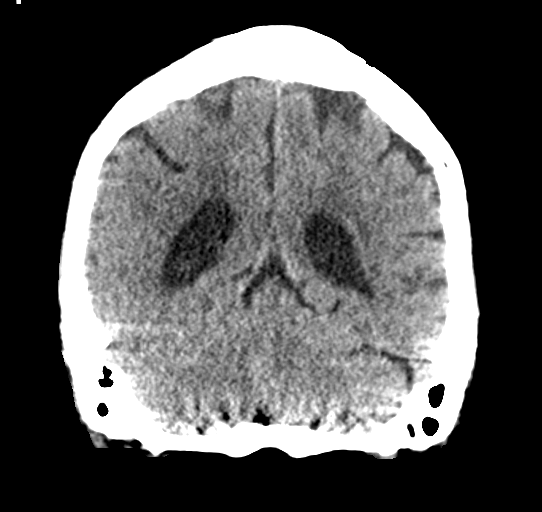
[im 33/74  brain]
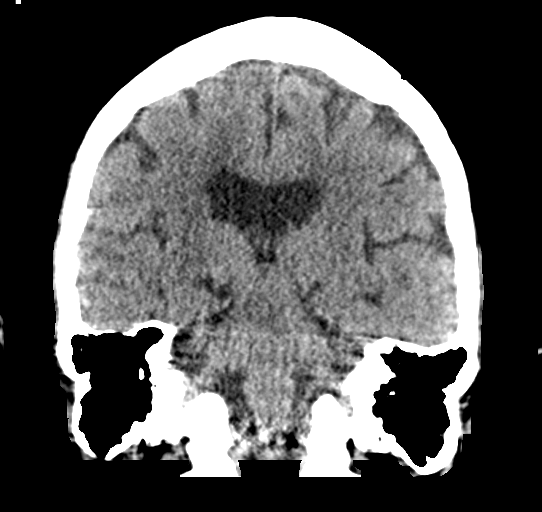
[im 41/74  brain]
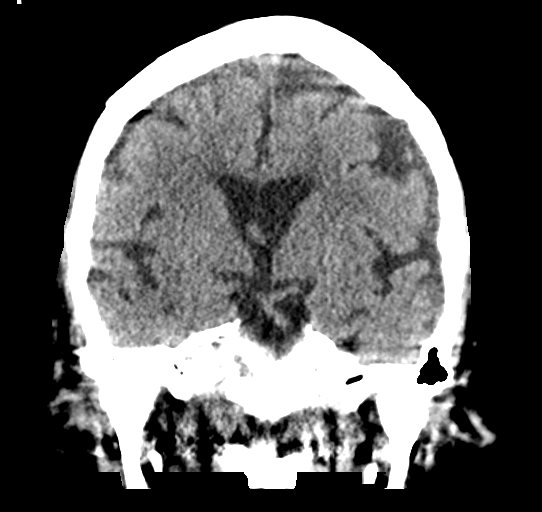

[Series 5: head 3.0 mpr sag · sagittal · 0.37mm/px · 3 of 67 slices shown]
[im 23/67  brain]
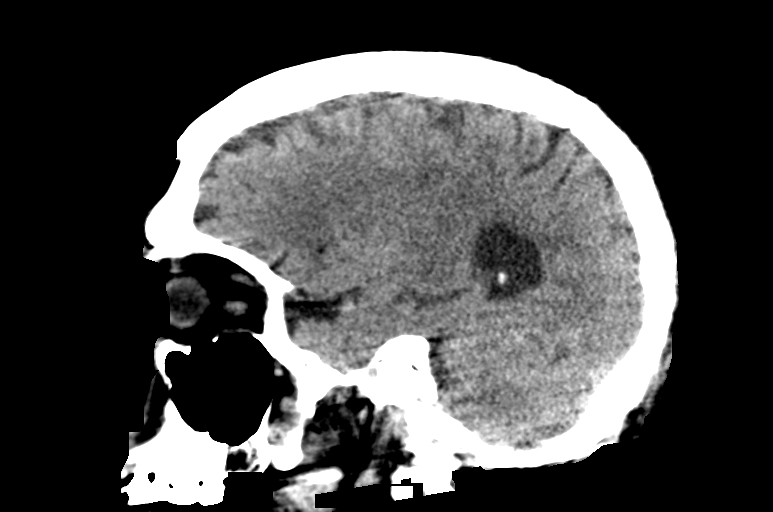
[im 34/67  brain]
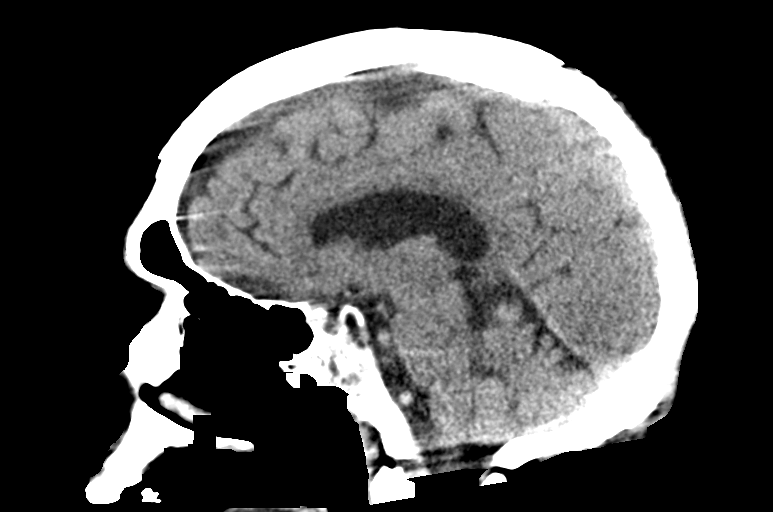
[im 45/67  brain]
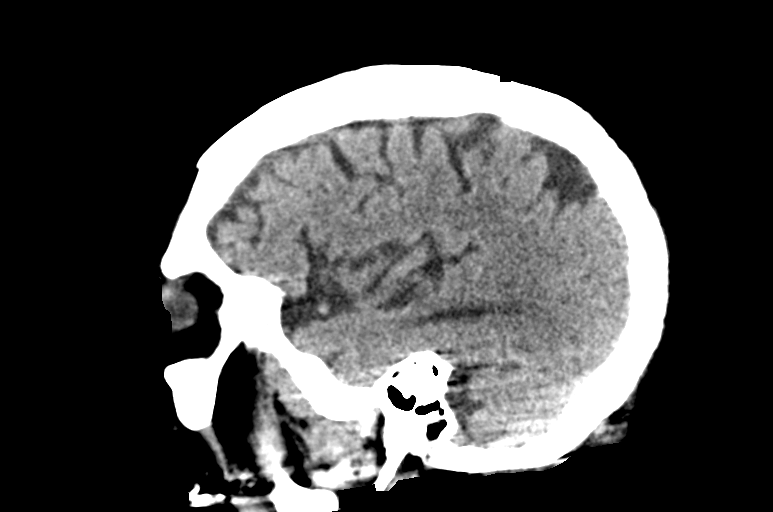

[15 of 47 positions shown; findings below may reference images not displayed]

FINDINGS: Brain: Mild motion degradation. No acute territorial infarction,
hemorrhage or intracranial mass. Mild atrophy. Mild chronic small
vessel ischemic change of the white matter. Probable chronic infarct
within the right basal ganglia and adjacent white matter.
Nonenlarged ventricles

Vascular: No hyperdense vessels.  Scattered carotid calcification

Skull: Normal. Negative for fracture or focal lesion.

Sinuses/Orbits: No acute finding.

Other: None
IMPRESSION: 1. Mild motion degradation. No definite CT evidence for acute
intracranial abnormality.
2. Atrophy and mild chronic small vessel ischemic change of the
white matter

## 2021-02-17 IMAGING — US US ABDOMEN LIMITED
1 series · 13 of 25 positions shown · non-contrast
Comparison: CT of the chest on 02/05/2020

CLINICAL DATA: Elevated LFTs.

EXAM:
ULTRASOUND ABDOMEN LIMITED RIGHT UPPER QUADRANT

[Series 1: us abdomen limited ruq · 13 of 56 slices shown]
[im 1/56]
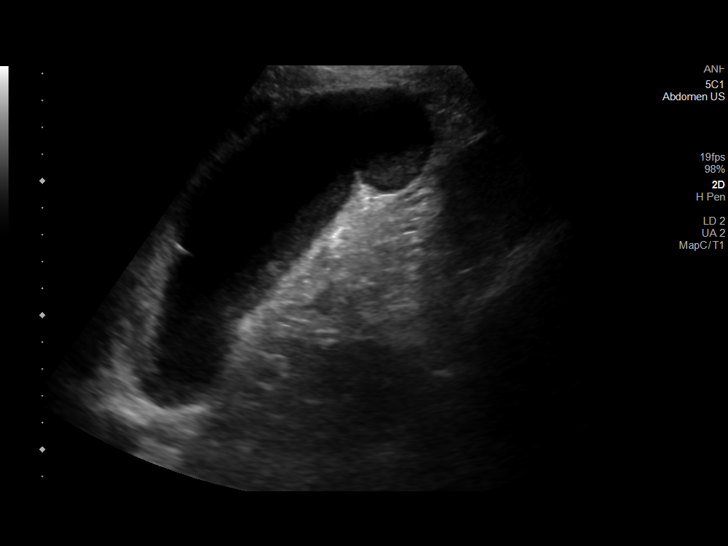
[im 5/56]
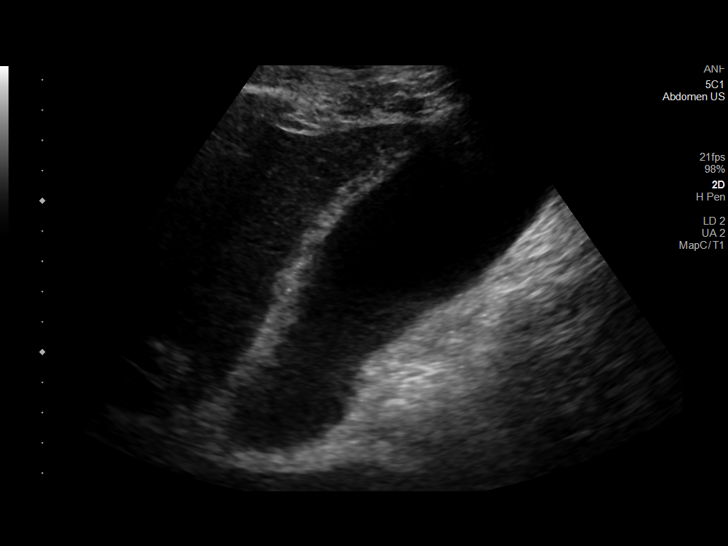
[im 10/56]
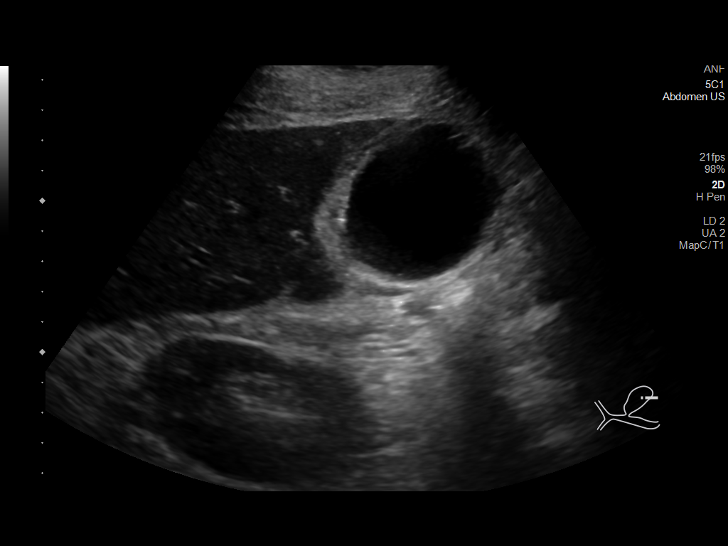
[im 14/56]
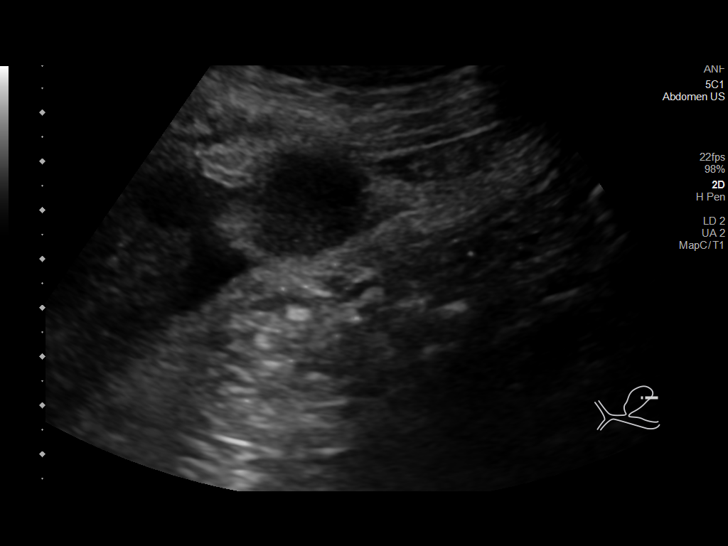
[im 19/56]
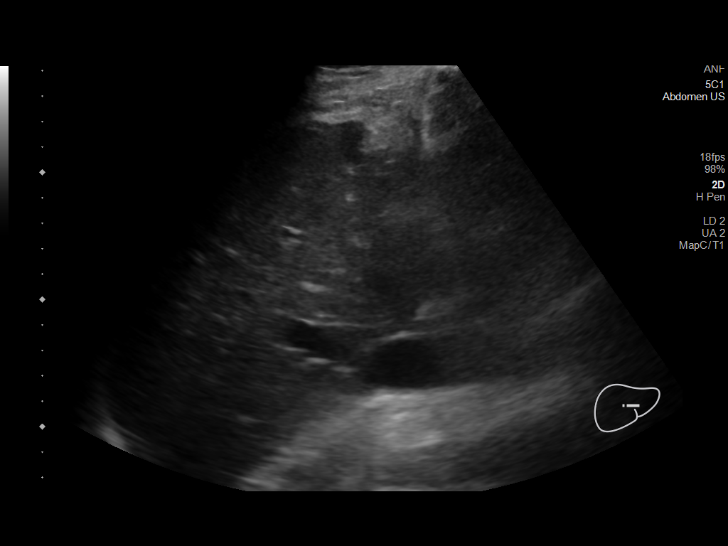
[im 23/56]
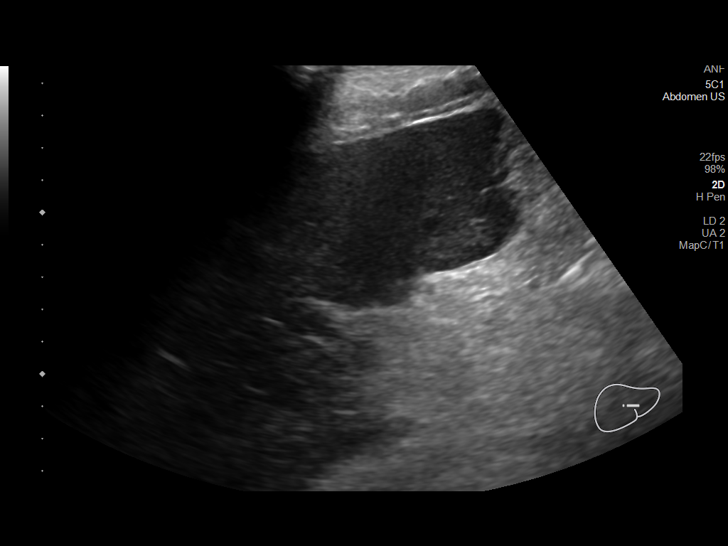
[im 28/56]
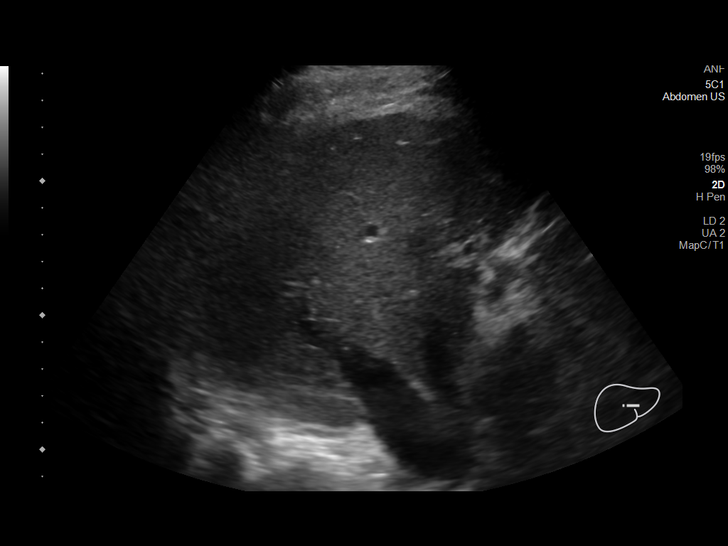
[im 33/56]
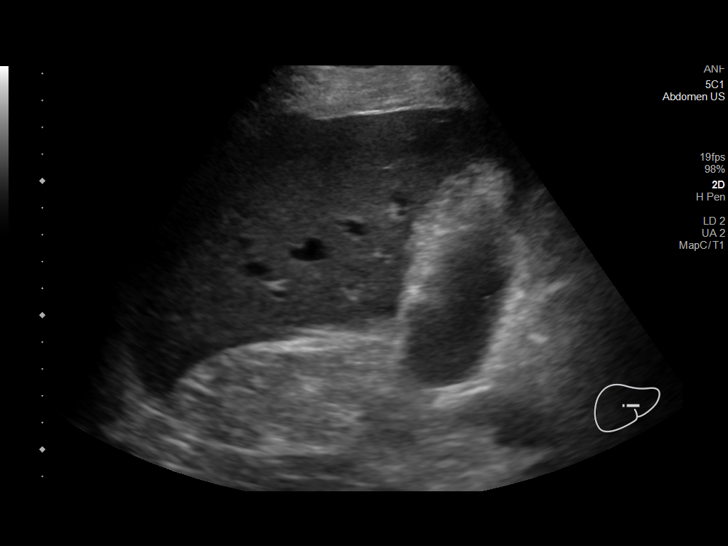
[im 37/56]
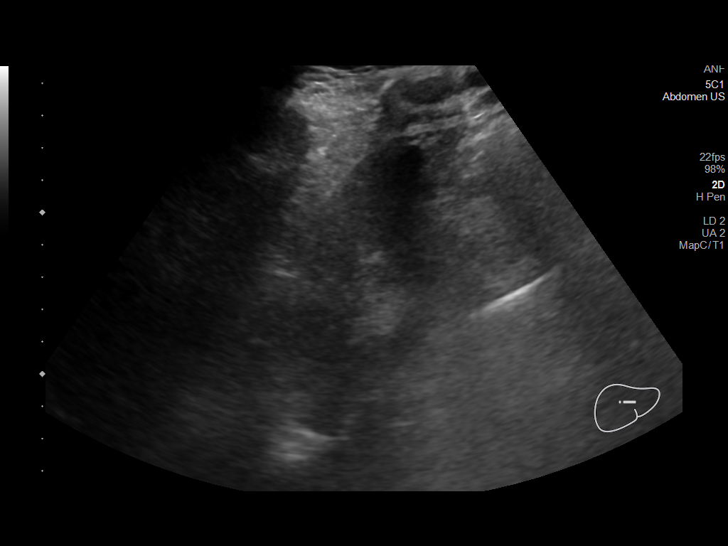
[im 42/56]
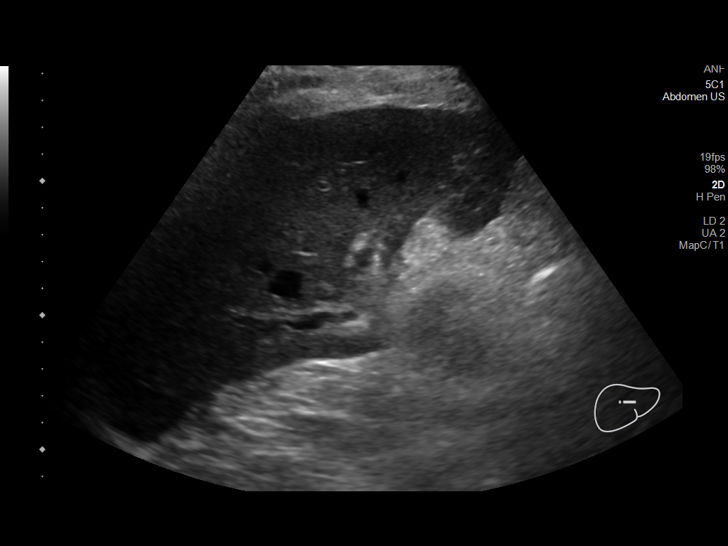
[im 46/56]
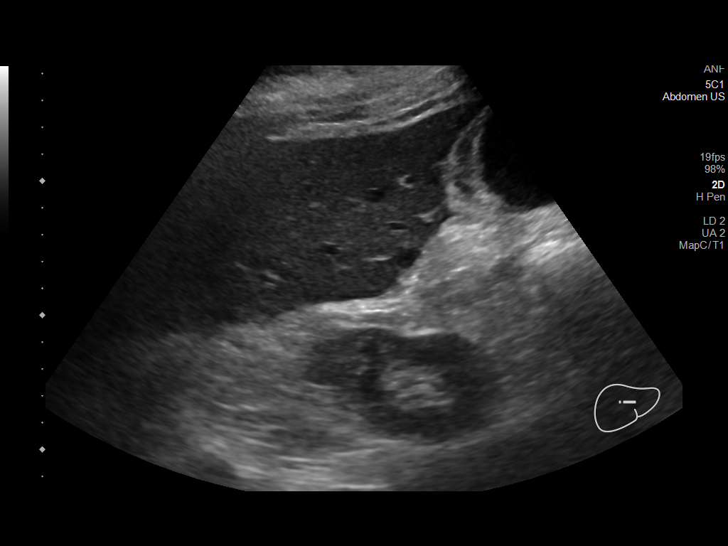
[im 51/56]
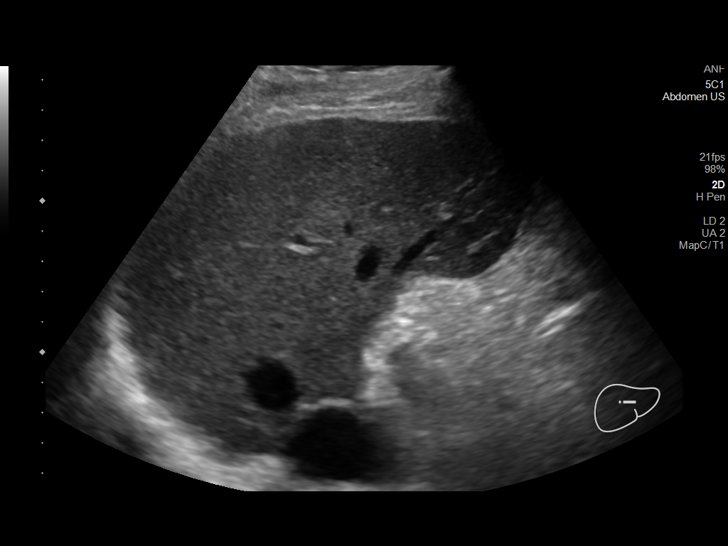
[im 56/56]
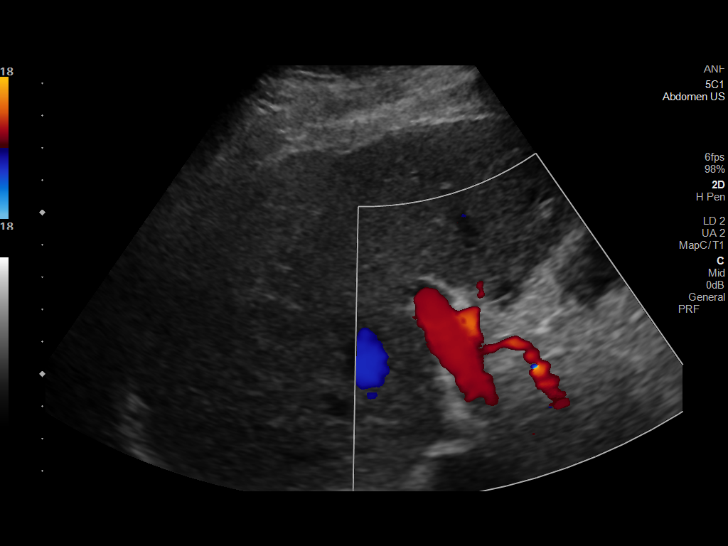

[13 of 25 positions shown; findings below may reference images not displayed]

FINDINGS: Gallbladder:

Gallbladder is distended. Gallbladder wall is 9.5 millimeters in
thickness and mildly heterogeneous. No sonographic Murphy's sign.
Sludge is present. A small stone is 8 millimeters. There is
pericholecystic fluid.

Common bile duct:

Diameter: Not well seen distally.  2.4 millimeters

Liver:

Nodular contour of the liver. Small cyst is identified adjacent to
the gallbladder fossa, measuring 1.5 x 0.9 x 1.0 centimeters. No
solid mass identified. Perihepatic fluid noted. Portal vein is
patent on color Doppler imaging with normal direction of blood flow
towards the liver.

Other: Study quality is degraded by limited patient mobility.
IMPRESSION: 1. Cirrhosis and perihepatic fluid.
2. Benign hepatic cyst, 1.5 centimeters.
3. Gallbladder wall thickening, pericholecystic fluid, sludge, and
stones. Although these findings can be present in the setting of
acute cholecystitis, the lack of sonographic Murphy sign makes the
findings less specific. As needed, hepatobiliary scan can be
performed to evaluate for acute cholecystitis.
